# Patient Record
Sex: Female | Born: 1995 | Race: Black or African American | Hispanic: Yes | Marital: Single | Smoking: Never smoker
Health system: Southern US, Community
[De-identification: ages and names within clinical notes are randomized; demographics above are authoritative.]

## PROBLEM LIST (undated history)

## (undated) DIAGNOSIS — F329 Major depressive disorder, single episode, unspecified: Secondary | ICD-10-CM

## (undated) DIAGNOSIS — G35 Multiple sclerosis: Secondary | ICD-10-CM

## (undated) DIAGNOSIS — G43909 Migraine, unspecified, not intractable, without status migrainosus: Secondary | ICD-10-CM

## (undated) DIAGNOSIS — D649 Anemia, unspecified: Secondary | ICD-10-CM

## (undated) DIAGNOSIS — F419 Anxiety disorder, unspecified: Secondary | ICD-10-CM

## (undated) DIAGNOSIS — G35D Multiple sclerosis, unspecified: Secondary | ICD-10-CM

## (undated) DIAGNOSIS — F32A Depression, unspecified: Secondary | ICD-10-CM

## (undated) DIAGNOSIS — K219 Gastro-esophageal reflux disease without esophagitis: Secondary | ICD-10-CM

## (undated) HISTORY — DX: Depression, unspecified: F32.A

## (undated) HISTORY — DX: Anxiety disorder, unspecified: F41.9

## (undated) HISTORY — PX: NO PAST SURGERIES: SHX2092

---
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
## 2016-02-27 ENCOUNTER — Emergency Department (HOSPITAL_COMMUNITY): Payer: Medicaid Other

## 2016-02-27 ENCOUNTER — Encounter (HOSPITAL_COMMUNITY): Payer: Self-pay | Admitting: Emergency Medicine

## 2016-02-27 ENCOUNTER — Emergency Department (HOSPITAL_COMMUNITY)
Admission: EM | Admit: 2016-02-27 | Discharge: 2016-02-27 | Disposition: A | Discharge status: Home / Self Care | Payer: Medicaid Other | Attending: Emergency Medicine | Admitting: Emergency Medicine

## 2016-02-27 DIAGNOSIS — R0789 Other chest pain: Secondary | ICD-10-CM | POA: Diagnosis not present

## 2016-02-27 DIAGNOSIS — R079 Chest pain, unspecified: Secondary | ICD-10-CM

## 2016-02-27 DIAGNOSIS — R071 Chest pain on breathing: Secondary | ICD-10-CM | POA: Diagnosis present

## 2016-02-27 HISTORY — DX: Migraine, unspecified, not intractable, without status migrainosus: G43.909

## 2016-02-27 MED ORDER — IBUPROFEN 400 MG PO TABS: 400.0000 mg | ORAL_TABLET | Freq: Four times a day (QID) | ORAL | 0 refills | Status: DC | PRN

## 2016-02-27 NOTE — ED Notes (Signed)
PT DISCHARGED. INSTRUCTIONS AND PRESCRIPTION GIVEN. AAOX4. PT IN NO APPARENT DISTRESS. THE OPPORTUNITY TO ASK QUESTIONS WAS PROVIDED. 

## 2016-02-27 NOTE — ED Provider Notes (Signed)
[facility]-EMERGENCY DEPT Provider Note   CSN: [id number] Arrival date & time: 02/27/16  1844  By signing my name below, I, Phillis HaggisGabriella Gaje, attest that this documentation has been prepared under the direction and in the presence of Fayrene HelperBowie Darnise Montag, PA-C. Electronically Signed: Phillis HaggisGabriella Gaje, ED Scribe. 02/27/16. 8:48 PM.  History   Chief Complaint Chief Complaint  Patient presents with  . Chest Wall Pain   The history is provided by the patient. No language interpreter was used.   HPI Comments: Tracy Mcconnell is a 20 y.o. female who presents to the Emergency Department complaining of left sided chest wall pain onset earlier this morning. Pt says that she woke up with the pain. She has worsening pain with deep breathing and laying on her left side. She has relief with sitting still. She has taken one dose of Tylenol to no relief. She denies recent surgeries, recent long travel, recent heavy lifting, hx of similar symptoms, hx of blood clots, use of hormones, or hx of asthma. Pt is not a smoker. She denies SOB, lightheadedness, dizziness, nausea, vomiting, diaphoresis, abdominal pain, or back pain.   Past Medical History:  Diagnosis Date  . Migraines     There are no active problems to display for this patient.   History reviewed. No pertinent surgical history.  OB History    No data available       Home Medications    Prior to Admission medications   Not on File    Family History No family history on file.  Social History Social History  Substance Use Topics  . Smoking status: Never Smoker  . Smokeless tobacco: Never Used  . Alcohol use No     Allergies   Patient has no allergy information on record.   Review of Systems Review of Systems  Constitutional: Negative for diaphoresis.  Respiratory: Negative for shortness of breath.   Cardiovascular: Positive for chest pain.  Gastrointestinal: Negative for abdominal pain, nausea and vomiting.  Musculoskeletal: Negative  for back pain.  Neurological: Negative for dizziness and light-headedness.   Physical Exam Updated Vital Signs BP 123/75 (BP Location: Right Arm)   Pulse 92   Temp 98.2 F (36.8 C) (Oral)   Resp 16   Ht 5\' 1"  (1.549 m)   Wt 110 lb (49.9 kg)   LMP 02/08/2016   SpO2 96%   BMI 20.78 kg/m   Physical Exam  Constitutional: She is oriented to person, place, and time. She appears well-developed and well-nourished.  HENT:  Head: Normocephalic and atraumatic.  Eyes: EOM are normal. Pupils are equal, round, and reactive to light.  Neck: Normal range of motion. Neck supple.  Cardiovascular: Normal rate, regular rhythm, normal heart sounds and intact distal pulses.   Pulmonary/Chest: Effort normal and breath sounds normal. She exhibits no tenderness (mild tenderness to L anterior chest wall.).  No skin changes or rash,  no crepitus or emphysema.  Breast with nipple piercing, no evidence of infection.  Abdominal: Soft. There is no tenderness.  Musculoskeletal: Normal range of motion.  Neurological: She is alert and oriented to person, place, and time.  Skin: Skin is warm and dry.  Psychiatric: She has a normal mood and affect. Her behavior is normal.  Nursing note and vitals reviewed.  ED Treatments / Results  DIAGNOSTIC STUDIES: Oxygen Saturation is 96% on RA, normal by my interpretation.    COORDINATION OF CARE: 8:51 PM-Discussed treatment plan which includes chest x-ray with pt at bedside and pt agreed to  plan.    Labs (all labs ordered are listed, but only abnormal results are displayed) Labs Reviewed - No data to display  EKG  EKG Interpretation None       Radiology Dg Chest 2 View  Result Date: 02/27/2016 CLINICAL DATA:  Left lower anterior chest wall pain EXAM: CHEST  2 VIEW COMPARISON:  None. FINDINGS: The heart size and mediastinal contours are within normal limits. Both lungs are clear. There is no pneumothorax. The visualized skeletal structures are unremarkable.  Nipple piercings are noted bilaterally. IMPRESSION: No active cardiopulmonary disease. Electronically Signed   By: Tollie Eth M.D.   On: 02/27/2016 19:37    Procedures Procedures (including critical care time)  Medications Ordered in ED Medications - No data to display   Initial Impression / Assessment and Plan / ED Course  I have reviewed the triage vital signs and the nursing notes.  Pertinent labs & imaging results that were available during my care of the patient were reviewed by me and considered in my medical decision making (see chart for details).  Clinical Course     BP 123/75 (BP Location: Right Arm)   Pulse 92   Temp 98.2 F (36.8 C) (Oral)   Resp 16   Ht 5\' 1"  (1.549 m)   Wt 49.9 kg   LMP 02/08/2016   SpO2 96%   BMI 20.78 kg/m    Final Clinical Impressions(s) / ED Diagnoses   Final diagnoses:  Chest pain, unspecified type   I personally performed the services described in this documentation, which was scribed in my presence. The recorded information has been reviewed and is accurate.     New Prescriptions New Prescriptions   IBUPROFEN (ADVIL,MOTRIN) 400 MG TABLET    Take 1 tablet (400 mg total) by mouth every 6 (six) hours as needed.   9:26 PM Pt here with reproducible L chest wall pain.  Pain atypical for ACS.  PERC negative, doubt PE.  CXR unremarkable.  Pt resting comfortably. No evidence of skin infection.  Pt has piercing but no pain around piercing.  Reassurance given, return precaution given.  Pt stable for discharge.     Fayrene Helper, PA-C 02/27/16 2127    Lyndal Pulley, MD 02/28/16 [phone]

## 2016-02-27 NOTE — ED Triage Notes (Signed)
Pt c/o L sided chest pain since this morning. Sts that she rolled over in bed and noticed it. Pain worse with movement and deep breathing. Pt not taking oral contraceptives. Pt denies N/V, dizziness, radiation of pain. Pt denies that it is painful to touch but then winces with palpation from RN.  Pt A&Ox4 and ambulatory.

## 2016-02-29 ENCOUNTER — Encounter (HOSPITAL_COMMUNITY): Payer: Self-pay

## 2016-02-29 ENCOUNTER — Emergency Department (HOSPITAL_COMMUNITY)
Admission: EM | Admit: 2016-02-29 | Discharge: 2016-02-29 | Disposition: A | Discharge status: Home / Self Care | Payer: Medicaid Other | Attending: Emergency Medicine | Admitting: Emergency Medicine

## 2016-02-29 DIAGNOSIS — R079 Chest pain, unspecified: Secondary | ICD-10-CM | POA: Insufficient documentation

## 2016-02-29 LAB — URINE MICROSCOPIC-ADD ON

## 2016-02-29 LAB — CBC
HCT: 39 % (ref 36.0–46.0)
HEMOGLOBIN: 13.2 g/dL (ref 12.0–15.0)
MCH: 28.8 pg (ref 26.0–34.0)
MCHC: 33.8 g/dL (ref 30.0–36.0)
MCV: 85.2 fL (ref 78.0–100.0)
PLATELETS: 349 10*3/uL (ref 150–400)
RBC: 4.58 MIL/uL (ref 3.87–5.11)
RDW: 12.7 % (ref 11.5–15.5)
WBC: 7.7 10*3/uL (ref 4.0–10.5)

## 2016-02-29 LAB — BASIC METABOLIC PANEL
ANION GAP: 7 (ref 5–15)
BUN: 10 mg/dL (ref 6–20)
CALCIUM: 9.6 mg/dL (ref 8.9–10.3)
CO2: 25 mmol/L (ref 22–32)
Chloride: 105 mmol/L (ref 101–111)
Creatinine, Ser: 0.74 mg/dL (ref 0.44–1.00)
Glucose, Bld: 101 mg/dL — ABNORMAL HIGH (ref 65–99)
Potassium: 3.5 mmol/L (ref 3.5–5.1)
SODIUM: 137 mmol/L (ref 135–145)

## 2016-02-29 LAB — URINALYSIS, ROUTINE W REFLEX MICROSCOPIC
BILIRUBIN URINE: NEGATIVE
GLUCOSE, UA: NEGATIVE mg/dL
Hgb urine dipstick: NEGATIVE
KETONES UR: 15 mg/dL — AB
NITRITE: NEGATIVE
PH: 6 (ref 5.0–8.0)
Protein, ur: NEGATIVE mg/dL
Specific Gravity, Urine: 1.019 (ref 1.005–1.030)

## 2016-02-29 LAB — POC URINE PREG, ED: Preg Test, Ur: NEGATIVE

## 2016-02-29 LAB — I-STAT TROPONIN, ED: TROPONIN I, POC: 0 ng/mL (ref 0.00–0.08)
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
## 2016-02-29 NOTE — Progress Notes (Signed)
Medicaid [place] access response hx indicates the assigned pcp is [facility] [address] [place], [place] [address] [phone]

## 2016-02-29 NOTE — Discharge Instructions (Signed)
Please read and follow all provided instructions.  Your diagnoses today include:  1. Chest pain, unspecified type     Tests performed today include:  An EKG of your heart  Cardiac enzymes - a blood test for heart muscle damage  Blood counts and electrolytes  Urine test - no infection  Vital signs. See below for your results today.   Medications prescribed:   None  Take any prescribed medications only as directed.  Follow-up instructions: Please follow-up with your primary care provider as soon as you can for further evaluation of your symptoms.   Return instructions:  SEEK IMMEDIATE MEDICAL ATTENTION IF:  You have severe chest pain, especially if the pain is crushing or pressure-like and spreads to the arms, back, neck, or jaw, or if you have sweating, nausea (feeling sick to your stomach), or shortness of breath. THIS IS AN EMERGENCY. Don't wait to see if the pain will go away. Get medical help at once. Call 911 or [phone] (operator). DO NOT drive yourself to the hospital.   Your chest pain gets worse and does not go away with rest.   You have an attack of chest pain lasting longer than usual, despite rest and treatment with the medications your caregiver has prescribed.   You wake from sleep with chest pain or shortness of breath.  You feel dizzy or faint.  You have chest pain not typical of your usual pain for which you originally saw your caregiver.   You have any other emergent concerns regarding your health.  Additional Information: Chest pain comes from many different causes. Your caregiver has diagnosed you as having chest pain that is not specific for one problem, but does not require admission.  You are at low risk for an acute heart condition or other serious illness.   Your vital signs today were: BP 114/75 (BP Location: Left Arm)    Pulse 93    Temp 97.7 F (36.5 C) (Oral)    Resp 18    LMP 02/08/2016    SpO2 98%  If your blood pressure (BP) was elevated above  135/85 this visit, please have this repeated by your doctor within one month. --------------

## 2016-02-29 NOTE — ED Triage Notes (Signed)
Pt seen on Monday for same.  Pt started having chest pain with dizziness on Monday.  Pt denies cough/fever.  Pt has had nausea with no vomiting.  Pt has hx of anxiety.  DX Monday was unspecified.  Pt was on way to school today when symptoms reoccurred.  Pt filled script of ibuprofen and has been taking.

## 2017-02-04 ENCOUNTER — Encounter: Payer: Self-pay | Admitting: Neurology

## 2017-02-04 ENCOUNTER — Ambulatory Visit: Payer: BLUE CROSS/BLUE SHIELD | Admitting: Neurology

## 2017-02-04 VITALS — BP 98/65 | HR 80 | Wt 118.4 lb

## 2017-02-04 DIAGNOSIS — R42 Dizziness and giddiness: Secondary | ICD-10-CM

## 2017-02-04 DIAGNOSIS — H532 Diplopia: Secondary | ICD-10-CM

## 2017-02-04 DIAGNOSIS — H5462 Unqualified visual loss, left eye, normal vision right eye: Secondary | ICD-10-CM

## 2017-02-04 DIAGNOSIS — R519 Headache, unspecified: Secondary | ICD-10-CM

## 2017-02-04 DIAGNOSIS — R51 Headache: Secondary | ICD-10-CM | POA: Diagnosis not present

## 2017-02-04 DIAGNOSIS — G43719 Chronic migraine without aura, intractable, without status migrainosus: Secondary | ICD-10-CM | POA: Diagnosis not present

## 2017-02-04 MED ORDER — GABAPENTIN 300 MG PO CAPS: 300.0000 mg | ORAL_CAPSULE | Freq: Three times a day (TID) | ORAL | 11 refills | Status: DC

## 2017-02-04 NOTE — Patient Instructions (Addendum)
Gabapentin capsules or tablets What is this medicine? GABAPENTIN (GA ba pen tin) is used to control partial seizures in adults with epilepsy. It is also used to treat certain types of nerve pain. This medicine may be used for other purposes; ask your health care provider or pharmacist if you have questions. COMMON BRAND NAME(S): Active-PAC with Gabapentin, Gabarone, Neurontin What should I tell my health care provider before I take this medicine? They need to know if you have any of these conditions: -kidney disease -suicidal thoughts, plans, or attempt; a previous suicide attempt by you or a family member -an unusual or allergic reaction to gabapentin, other medicines, foods, dyes, or preservatives -pregnant or trying to get pregnant -breast-feeding How should I use this medicine? Take this medicine by mouth with a glass of water. Follow the directions on the prescription label. You can take it with or without food. If it upsets your stomach, take it with food.Take your medicine at regular intervals. Do not take it more often than directed. Do not stop taking except on your doctor's advice. If you are directed to break the 600 or 800 mg tablets in half as part of your dose, the extra half tablet should be used for the next dose. If you have not used the extra half tablet within 28 days, it should be thrown away. A special MedGuide will be given to you by the pharmacist with each prescription and refill. Be sure to read this information carefully each time. Talk to your pediatrician regarding the use of this medicine in children. Special care may be needed. Overdosage: If you think you have taken too much of this medicine contact a poison control center or emergency room at once. NOTE: This medicine is only for you. Do not share this medicine with others. What if I miss a dose? If you miss a dose, take it as soon as you can. If it is almost time for your next dose, take only that dose. Do not  take double or extra doses. What may interact with this medicine? Do not take this medicine with any of the following medications: -other gabapentin products This medicine may also interact with the following medications: -alcohol -antacids -antihistamines for allergy, cough and cold -certain medicines for anxiety or sleep -certain medicines for depression or psychotic disturbances -homatropine; hydrocodone -naproxen -narcotic medicines (opiates) for pain -phenothiazines like chlorpromazine, mesoridazine, prochlorperazine, thioridazine This list may not describe all possible interactions. Give your health care provider a list of all the medicines, herbs, non-prescription drugs, or dietary supplements you use. Also tell them if you smoke, drink alcohol, or use illegal drugs. Some items may interact with your medicine. What should I watch for while using this medicine? Visit your doctor or health care professional for regular checks on your progress. You may want to keep a record at home of how you feel your condition is responding to treatment. You may want to share this information with your doctor or health care professional at each visit. You should contact your doctor or health care professional if your seizures get worse or if you have any new types of seizures. Do not stop taking this medicine or any of your seizure medicines unless instructed by your doctor or health care professional. Stopping your medicine suddenly can increase your seizures or their severity. Wear a medical identification bracelet or chain if you are taking this medicine for seizures, and carry a card that lists all your medications. You may get drowsy, dizzy,   or have blurred vision. Do not drive, use machinery, or do anything that needs mental alertness until you know how this medicine affects you. To reduce dizzy or fainting spells, do not sit or stand up quickly, especially if you are an older patient. Alcohol can  increase drowsiness and dizziness. Avoid alcoholic drinks. Your mouth may get dry. Chewing sugarless gum or sucking hard candy, and drinking plenty of water will help. The use of this medicine may increase the chance of suicidal thoughts or actions. Pay special attention to how you are responding while on this medicine. Any worsening of mood, or thoughts of suicide or dying should be reported to your health care professional right away. Women who become pregnant while using this medicine may enroll in the [place] American Antiepileptic Drug Pregnancy Registry by calling [phone]. This registry collects information about the safety of antiepileptic drug use during pregnancy. What side effects may I notice from receiving this medicine? Side effects that you should report to your doctor or health care professional as soon as possible: -allergic reactions like skin rash, itching or hives, swelling of the face, lips, or tongue -worsening of mood, thoughts or actions of suicide or dying Side effects that usually do not require medical attention (report to your doctor or health care professional if they continue or are bothersome): -constipation -difficulty walking or controlling muscle movements -dizziness -nausea -slurred speech -tiredness -tremors -weight gain This list may not describe all possible side effects. Call your doctor for medical advice about side effects. You may report side effects to FDA at 1-800-FDA-1088. Where should I keep my medicine? Keep out of reach of children. This medicine may cause accidental overdose and death if it taken by other adults, children, or pets. Mix any unused medicine with a substance like cat litter or coffee grounds. Then throw the medicine away in a sealed container like a sealed bag or a coffee can with a lid. Do not use the medicine after the expiration date. Store at room temperature between 15 and 30 degrees C (59 and 86 degrees F). NOTE: This  sheet is a summary. It may not cover all possible information. If you have questions about this medicine, talk to your doctor, pharmacist, or health care provider.  2018 Elsevier/Gold Standard (2013-05-15 15:26:50)   To prevent or relieve headaches, try the following: Cool Compress. Lie down and place a cool compress on your head.  Avoid headache triggers. If certain foods or odors seem to have triggered your migraines in the past, avoid them. A headache diary might help you identify triggers.  Include physical activity in your daily routine. Try a daily walk or other moderate aerobic exercise.  Manage stress. Find healthy ways to cope with the stressors, such as delegating tasks on your to-do list.  Practice relaxation techniques. Try deep breathing, yoga, massage and visualization.  Eat regularly. Eating regularly scheduled meals and maintaining a healthy diet might help prevent headaches. Also, drink plenty of fluids.  Follow a regular sleep schedule. Sleep deprivation might contribute to headaches Consider biofeedback. With this mind-body technique, you learn to control certain bodily functions - such as muscle tension, heart rate and blood pressure - to prevent headaches or reduce headache pain.    Proceed to emergency room if you experience new or worsening symptoms or symptoms do not resolve, if you have new neurologic symptoms or if headache is severe, or for any concerning symptom.   Provided education and documentation from American headache Society toolbox including  articles on: chronic migraine medication overuse headache, chronic migraines, prevention of migraines, behavioral and other nonpharmacologic treatments for headache.    Migraine Headache A migraine headache is an intense, throbbing pain on one side or both sides of the head. Migraines may also cause other symptoms, such as nausea, vomiting, and sensitivity to light and noise. What are the causes? Doing or taking  certain things may also trigger migraines, such as:  Alcohol.  Smoking.  Medicines, such as: ? Medicine used to treat chest pain (nitroglycerine). ? Birth control pills. ? Estrogen pills. ? Certain blood pressure medicines.  Aged cheeses, chocolate, or caffeine.  Foods or drinks that contain nitrates, glutamate, aspartame, or tyramine.  Physical activity.  Other things that may trigger a migraine include:  Menstruation.  Pregnancy.  Hunger.  Stress, lack of sleep, too much sleep, or fatigue.  Weather changes.  What increases the risk? The following factors may make you more likely to experience migraine headaches:  Age. Risk increases with age.  Family history of migraine headaches.  Being Caucasian.  Depression and anxiety.  Obesity.  Being a woman.  Having a hole in the heart (patent foramen ovale) or other heart problems.  What are the signs or symptoms? The main symptom of this condition is pulsating or throbbing pain. Pain may:  Happen in any area of the head, such as on one side or both sides.  Interfere with daily activities.  Get worse with physical activity.  Get worse with exposure to bright lights or loud noises.  Other symptoms may include:  Nausea.  Vomiting.  Dizziness.  General sensitivity to bright lights, loud noises, or smells.  Before you get a migraine, you may get warning signs that a migraine is developing (aura). An aura may include:  Seeing flashing lights or having blind spots.  Seeing bright spots, halos, or zigzag lines.  Having tunnel vision or blurred vision.  Having numbness or a tingling feeling.  Having trouble talking.  Having muscle weakness.  How is this diagnosed? A migraine headache can be diagnosed based on:  Your symptoms.  A physical exam.  Tests, such as CT scan or MRI of the head. These imaging tests can help rule out other causes of headaches.  Taking fluid from the spine (lumbar  puncture) and analyzing it (cerebrospinal fluid analysis, or CSF analysis).  How is this treated? A migraine headache is usually treated with medicines that:  Relieve pain.  Relieve nausea.  Prevent migraines from coming back.  Treatment may also include:  Acupuncture.  Lifestyle changes like avoiding foods that trigger migraines.  Follow these instructions at home: Medicines  Take over-the-counter and prescription medicines only as told by your health care provider.  Do not drive or use heavy machinery while taking prescription pain medicine.  To prevent or treat constipation while you are taking prescription pain medicine, your health care provider may recommend that you: ? Drink enough fluid to keep your urine clear or pale yellow. ? Take over-the-counter or prescription medicines. ? Eat foods that are high in fiber, such as fresh fruits and vegetables, whole grains, and beans. ? Limit foods that are high in fat and processed sugars, such as fried and sweet foods. Lifestyle  Avoid alcohol use.  Do not use any products that contain nicotine or tobacco, such as cigarettes and e-cigarettes. If you need help quitting, ask your health care provider.  Get at least 8 hours of sleep every night.  Limit your stress. General instructions  Keep a journal to find out what may trigger your migraine headaches. For example, write down: ? What you eat and drink. ? How much sleep you get. ? Any change to your diet or medicines.  If you have a migraine: ? Avoid things that make your symptoms worse, such as bright lights. ? It may help to lie down in a dark, quiet room. ? Do not drive or use heavy machinery. ? Ask your health care provider what activities are safe for you while you are experiencing symptoms.  Keep all follow-up visits as told by your health care provider. This is important. Contact a health care provider if:  You develop symptoms that are different or more  severe than your usual migraine symptoms. Get help right away if:  Your migraine becomes severe.  You have a fever.  You have a stiff neck.  You have vision loss.  Your muscles feel weak or like you cannot control them.  You start to lose your balance often.  You develop trouble walking.  You faint. This information is not intended to replace advice given to you by your health care provider. Make sure you discuss any questions you have with your health care provider. Document Released: 03/19/2005 Document Revised: 10/07/2015 Document Reviewed: 09/05/2015 Elsevier Interactive Patient Education  2017 [employer].

## 2017-02-04 NOTE — Progress Notes (Signed)
[facility]    Provider:  Dr Lucia GaskinsAhern Referring Provider: Inis SizerWebster, William L, PA-C Primary Care Physician:   Inis SizerWebster, William L, PA-C  CC:  migraines  HPI:  Tracy BurkeCaitlyn Mcconnell is a 21 y.o. female here as a referral from Dr. Zenda AlpersWebster for migraines, anxiety. She has a FHx of migraines in mother. She has had migraines since the 5. Her migraines are unilateral, move to the other side, behind the eyes, squeezing, pulsating, bad nausea but no vomiting, she has light and sound sensitivity, she has to sit perfectly still because movement exacerbates it. She has headaches every day, she wakes up with them. If she spends too much time in the sunlight she can get a migraine, stress. She has had daily headaches in the last year, can last all day long some migraines last a week, every headache is migrainous. No medication overuse. Rarely she has an aura, she can have a cloud sometimes on the left but not always. Not sleeping at night, there is a lot of stress. Laying down in the dark helps. Grandmother with brain tumor, unknown kind.  Reviewed notes, labs and imaging from outside physicians, which showed:   Medications tried for migraine: Sertraline, topiramate(for a year, 100mg ), amitriptyline(for a year), Imitrex(bad reaction), clonidine.  Patient has 2-4 migraines per month. Failure or intolerance to Imitrex, Topamax and amitriptyline. She has a history of migraine headache with and without aura, currently having several headaches per month, has been unable to tolerate multiple medications, no status migrainosus.  Reviewed emergency room notes she has been in the emergency roomm ultiple times in the last year for anxiety presenting as chest pain and dizziness, chest x-ray was normal, heart racing, chest pain, sharp in the middle of her chest, lightheaded but no syncopal episode.    Review of Systems: Patient complains of symptoms per HPI as well as the following symptoms: fevers chills,  fa, blurred visio, double vision, eye pain, feeling cold, increased thirst, restless legs, decreased energy, disinterest in activities. Pertinent negatives and positives per HPI. All others negative.   Social History   Socioeconomic History  . Marital status: Single    Spouse name: Not on file  . Number of children: 0  . Years of education: college student  . Highest education level: Not on file  Social Needs  . Financial resource strain: Not on file  . Food insecurity - worry: Not on file  . Food insecurity - inability: Not on file  . Transportation needs - medical: No  . Transportation needs - non-medical: No  Occupational History  . Not on file  Tobacco Use  . Smoking status: Never Smoker  . Smokeless tobacco: Never Used  Substance and Sexual Activity  . Alcohol use: No  . Drug use: No  . Sexual activity: Not on file  Other Topics Concern  . Not on file  Social History Narrative   Lives at home alone   Right handed   Does not drink caffeine, makes her nauseated    Family History  Problem Relation Age of Onset  . Migraines Mother   . Thyroid disease Mother   . Diabetes Paternal Grandfather     Past Medical History:  Diagnosis Date  . Migraines     Past Surgical History:  Procedure Laterality Date  . NO PAST SURGERIES      Current Outpatient Medications  Medication Sig Dispense Refill  . acetaminophen (TYLENOL) 500 MG tablet Take 500 mg by mouth every 6 (six) hours  as needed (pain).    . cloNIDine (CATAPRES) 0.1 MG tablet Take 0.1-0.2 mg at bedtime as needed by mouth.    [name] ibuprofen (ADVIL,MOTRIN) 400 MG tablet Take 1 tablet (400 mg total) by mouth every 6 (six) hours as needed. 30 tablet 0  . Sertraline HCl (ZOLOFT PO) Take 75 mg daily by mouth.    . gabapentin (NEURONTIN) 300 MG capsule Take 1 capsule (300 mg total) 3 (three) times daily by mouth. 90 capsule 11   No current facility-administered medications for this visit.     Allergies as of  02/04/2017 - Review Complete 02/04/2017  Allergen Reaction Noted  . Imitrex [sumatriptan] Shortness Of Breath 02/04/2017    Vitals: BP 98/65 (BP Location: Right Arm, Patient Position: Sitting)   Pulse 80   Wt 118 lb 6.4 oz (53.7 kg)   BMI 22.37 kg/m  Last Weight:  Wt Readings from Last 1 Encounters:  02/04/17 118 lb 6.4 oz (53.7 kg)   Last Height:   Ht Readings from Last 1 Encounters:  02/27/16 5\' 1"  (1.549 m)   Physical exam: Exam: Gen: NAD, conversant                 CV: RRR, no MRG. No Carotid Bruits. No peripheral edema, warm, nontender Eyes: Conjunctivae clear without exudates or hemorrhage  Neuro: Detailed Neurologic Exam  Speech:    Speech is normal; fluent and spontaneous with normal comprehension.  Cognition:    The patient is oriented to person, place, and time;     recent and remote memory intact;     language fluent;     normal attention, concentration,     fund of knowledge Cranial Nerves:    The pupils are equal, round, and reactive to light. The fundi are normal and spontaneous venous pulsations are present. Visual fields are full to finger confrontation. Extraocular movements are intact. Trigeminal sensation is intact and the muscles of mastication are normal. The face is symmetric. The palate elevates in the midline. Hearing intact. Voice is normal. Shoulder shrug is normal. The tongue has normal motion without fasciculations.   Coordination:    Normal finger to nose and heel to shin. Normal rapid alternating movements.   Gait:    Heel-toe and tandem gait are normal.   Motor Observation:    No asymmetry, no atrophy, and no involuntary movements noted. Tone:    Normal muscle tone.    Posture:    Posture is normal. normal erect    Strength:    Strength is V/V in the upper and lower limbs.      Sensation: intact to LT     Reflex Exam:  DTR's:    Deep tendon reflexes in the upper and lower extremities are normal bilaterally.   Toes:    The  toes are downgoing bilaterally.   Clonus:    Clonus is absent.       Assessment/Plan:  21 year old with chronic intractable headaches with concerning associated symptoms such as positional quality and diplopia absolutely need MRI of the brain with and without contrast as below. Differential includes chronic migraines, we'll start gabapentin, there is a component of medication overuse discussed daily ibuprofen and need to titrate to less than 10 days a month, discussed Botox for migraine or CGRP  Start Gabapentin for prevention, will discuss acute medications at next appointment Titrate off of the daily ibuprofen  Botox for migraine or CGRP would be the next steps MRI of the brain w/wo contrast for  positional headaches, diplopia, dizziness and vertigo to evaluate for space occupying lesions or masses, intracranial hypertension, compressive lesions in the brain.  Discussed:  There is increased risk for stroke in women with migraine with aura and a  Contraindication for the combined contraceptive pill for use by women who have migraine with aura, which is in line with World Health Organisation recommendations. The risk for women with migraine without aura is lower and other risk factors like smoking are far more likely to increase stroke risk than migraine. There is a recommendation for no smoking and for the use of low estrogen or progestogen only pills particularly for women with migraine with aura. It is important however that women with migraine who are taking the pill do not decide to suddenly stop taking it without discussing this with their doctor. Please discuss with her OB/GYN.   Orders Placed This Encounter  Procedures  . MR BRAIN W WO CONTRAST  . CBC  . Comprehensive metabolic panel  . TSH    Discussed: To prevent or relieve headaches, try the following: Cool Compress. Lie down and place a cool compress on your head.  Avoid headache triggers. If certain foods or odors seem to  have triggered your migraines in the past, avoid them. A headache diary might help you identify triggers.  Include physical activity in your daily routine. Try a daily walk or other moderate aerobic exercise.  Manage stress. Find healthy ways to cope with the stressors, such as delegating tasks on your to-do list.  Practice relaxation techniques. Try deep breathing, yoga, massage and visualization.  Eat regularly. Eating regularly scheduled meals and maintaining a healthy diet might help prevent headaches. Also, drink plenty of fluids.  Follow a regular sleep schedule. Sleep deprivation might contribute to headaches Consider biofeedback. With this mind-body technique, you learn to control certain bodily functions - such as muscle tension, heart rate and blood pressure - to prevent headaches or reduce headache pain.    Proceed to emergency room if you experience new or worsening symptoms or symptoms do not resolve, if you have new neurologic symptoms or if headache is severe, or for any concerning symptom.   Provided education and documentation from American headache Society toolbox including articles on: chronic migraine medication overuse headache, chronic migraines, prevention of migraines, behavioral and other nonpharmacologic treatments for headache.   Naomie Dean, MD  [facility] [address] [place], [place] [address]  Phone [phone] Fax [phone]

## 2017-02-05 ENCOUNTER — Telehealth: Payer: Self-pay | Admitting: *Deleted

## 2017-02-05 ENCOUNTER — Encounter: Payer: Self-pay | Admitting: *Deleted

## 2017-02-05 LAB — COMPREHENSIVE METABOLIC PANEL
ALT: 9 IU/L (ref 0–32)
AST: 19 IU/L (ref 0–40)
Albumin/Globulin Ratio: 1.7 (ref 1.2–2.2)
Albumin: 4.8 g/dL (ref 3.5–5.5)
Alkaline Phosphatase: 107 IU/L (ref 39–117)
BUN/Creatinine Ratio: 18 (ref 9–23)
BUN: 13 mg/dL (ref 6–20)
Bilirubin Total: 0.5 mg/dL (ref 0.0–1.2)
CALCIUM: 10 mg/dL (ref 8.7–10.2)
CO2: 19 mmol/L — ABNORMAL LOW (ref 20–29)
CREATININE: 0.74 mg/dL (ref 0.57–1.00)
Chloride: 100 mmol/L (ref 96–106)
GFR, EST AFRICAN AMERICAN: 134 mL/min/{1.73_m2} (ref >59)
GFR, EST NON AFRICAN AMERICAN: 116 mL/min/{1.73_m2} (ref >59)
GLOBULIN, TOTAL: 2.8 g/dL (ref 1.5–4.5)
Glucose: 73 mg/dL (ref 65–99)
Potassium: 4.2 mmol/L (ref 3.5–5.2)
SODIUM: 138 mmol/L (ref 134–144)
TOTAL PROTEIN: 7.6 g/dL (ref 6.0–8.5)

## 2017-02-05 LAB — CBC
HEMATOCRIT: 39.9 % (ref 34.0–46.6)
HEMOGLOBIN: 13.4 g/dL (ref 11.1–15.9)
MCH: 28.5 pg (ref 26.6–33.0)
MCHC: 33.6 g/dL (ref 31.5–35.7)
MCV: 85 fL (ref 79–97)
Platelets: 392 10*3/uL — ABNORMAL HIGH (ref 150–379)
RBC: 4.71 x10E6/uL (ref 3.77–5.28)
RDW: 13.1 % (ref 12.3–15.4)
WBC: 9.7 10*3/uL (ref 3.4–10.8)

## 2017-02-05 LAB — TSH: TSH: 2.24 u[IU]/mL (ref 0.450–4.500)

## 2017-02-05 NOTE — Telephone Encounter (Signed)
When the patient calls back, please also tell her that her labs are unremarkable.

## 2017-02-05 NOTE — Telephone Encounter (Signed)
Letter for work written for patient. Ready for pickup at front desk. Called and LVM for patient (unable to leave details).   If she calls back, please let her know that the letter for work is at the front desk.

## 2017-02-06 NOTE — Telephone Encounter (Signed)
Called and LVM (ok per DPR) informing pt that her letter for work has been written and is at the front desk for pickup by her. I also informed her that her labs are unremarkable. I left our office number in case she has any questions and stated that this message does not require a call back.

## 2017-02-12 ENCOUNTER — Ambulatory Visit: Payer: BLUE CROSS/BLUE SHIELD

## 2017-02-12 DIAGNOSIS — R42 Dizziness and giddiness: Secondary | ICD-10-CM

## 2017-02-12 DIAGNOSIS — H5462 Unqualified visual loss, left eye, normal vision right eye: Secondary | ICD-10-CM

## 2017-02-12 DIAGNOSIS — H532 Diplopia: Secondary | ICD-10-CM

## 2017-02-12 DIAGNOSIS — R51 Headache: Secondary | ICD-10-CM

## 2017-02-12 DIAGNOSIS — R519 Headache, unspecified: Secondary | ICD-10-CM

## 2017-02-18 ENCOUNTER — Telehealth: Payer: Self-pay | Admitting: *Deleted

## 2017-02-18 NOTE — Telephone Encounter (Signed)
-----   Message from Anson FretAntonia B Ahern, MD sent at 02/17/2017  8:38 PM EST ----- MRI of the brain is normal thanks

## 2017-02-18 NOTE — Telephone Encounter (Signed)
Called and LVM (ok per DPR) informing pt that her MRI of brain is normal. This message does not require a call back but I left the office number in case she had any questions.

## 2017-05-06 ENCOUNTER — Telehealth: Payer: Self-pay | Admitting: Neurology

## 2017-05-06 NOTE — Telephone Encounter (Signed)
Error

## 2017-05-08 DIAGNOSIS — Z0289 Encounter for other administrative examinations: Secondary | ICD-10-CM

## 2017-05-16 ENCOUNTER — Telehealth: Payer: Self-pay

## 2017-05-16 NOTE — Telephone Encounter (Signed)
Rn call patient about requesting a letter for office of accessibility resources and services. PT did sign a release form for the letter. Rn stated Dr. Lucia Gaskins needed to know what she needs in the letter because one was written 01/2017. PT stated another letter is needed because of her chronic illness of migraines. The letter needs to be on letter head paper from the MD. It needs to include the following:  1. What is her chronic illness? 2. What medications she takes for the illness? 3. What symptoms she has when a migraine episode? 4. How often she will have the migraines?  Rn stated that usually this information goes in a FMLA form. Rn stated a message will be sent to Dr. Lucia Gaskins.Pt verbalized understanding.

## 2017-05-22 NOTE — Telephone Encounter (Signed)
Sinclair Grooms patient's partner called to give fax number for school. Fax number is [phone]

## 2017-05-22 NOTE — Telephone Encounter (Signed)
If patient calls back we just need the fax number with to send the letter.  Left vm for patient to call back with fax number. Rn stated on vm the letter was done for her school.

## 2017-05-22 NOTE — Telephone Encounter (Signed)
Letter was fax to [phone]. Fax confirmed and receive.

## 2017-05-22 NOTE — Telephone Encounter (Signed)
Rn was notified by front desk that pts partner Sinclair Grooms wants a copy of letter. Letter given to [facility] for patient.

## 2017-06-06 ENCOUNTER — Ambulatory Visit: Payer: BLUE CROSS/BLUE SHIELD | Admitting: Adult Health

## 2017-06-06 ENCOUNTER — Encounter: Payer: Self-pay | Admitting: Adult Health

## 2017-06-06 ENCOUNTER — Encounter (INDEPENDENT_AMBULATORY_CARE_PROVIDER_SITE_OTHER): Payer: Self-pay

## 2017-06-06 VITALS — BP 104/67 | HR 75 | Ht 61.0 in | Wt 123.0 lb

## 2017-06-06 DIAGNOSIS — G43019 Migraine without aura, intractable, without status migrainosus: Secondary | ICD-10-CM | POA: Diagnosis not present

## 2017-06-06 MED ORDER — FREMANEZUMAB-VFRM 225 MG/1.5ML ~~LOC~~ SOSY: 225.0000 mg | PREFILLED_SYRINGE | SUBCUTANEOUS | 5 refills | Status: DC

## 2017-06-06 NOTE — Patient Instructions (Signed)
Your Plan:  Continue gabapentin for now Start Ajovy If your symptoms worsen or you develop new symptoms please let us know.   Thank you for coming to see [place] at [facility]. I hope we have been able to provide you high quality care today.  You may receive a patient satisfaction survey over the next few weeks. We would appreciate your feedback and comments so that we may continue to improve ourselves and the health of our patients.

## 2017-06-06 NOTE — Progress Notes (Signed)
PATIENT: Tracy Mcconnell DOB: 08-17-1995  REASON FOR VISIT: follow up HISTORY FROM: patient  HISTORY OF PRESENT ILLNESS: Today 06/06/17  [name] is a 22 year old female with a history of migraine headaches.  She returns today for follow-up.  She was placed on gabapentin 300 mg 3 times a day at the last visit.  She reports that it has not offered her any benefit with her headaches.  She continues to have approximately 4 migraines a month reports that 1 migraine can last almost a full week sometimes.  She states that she only has 10 headache free days out of the month.  She states that the headaches are behind her eyes and spread all over her head.  She does have photophobia, phonophobia and nausea.  Denies any vomiting.  She denies any numbness or tingling in extremities.  In the past she has tried Imitrex, Topamax and amitriptyline but was unable to tolerate these medications.  She returns today for evaluation.  HISTORY Tracy Mcconnell is a 22 y.o. female here as a referral from Dr. Zenda Alpers for migraines, anxiety. She has a FHx of migraines in mother. She has had migraines since the 5. Her migraines are unilateral, move to the other side, behind the eyes, squeezing, pulsating, bad nausea but no vomiting, she has light and sound sensitivity, she has to sit perfectly still because movement exacerbates it. She has headaches every day, she wakes up with them. If she spends too much time in the sunlight she can get a migraine, stress. She has had daily headaches in the last year, can last all day long some migraines last a week, every headache is migrainous. No medication overuse. Rarely she has an aura, she can have a cloud sometimes on the left but not always. Not sleeping at night, there is a lot of stress. Laying down in the dark helps. Grandmother with brain tumor, unknown kind.  Reviewed notes, labs and imaging from outside physicians, which showed:   Medications tried for migraine:  Sertraline, topiramate(for a year, 100mg ), amitriptyline(for a year), Imitrex(bad reaction), clonidine.  Patient has 2-4 migraines per month. Failure or intolerance to Imitrex, Topamax and amitriptyline. She has a history of migraine headache with and without aura, currently having several headaches per month, has been unable to tolerate multiple medications, no status migrainosus.  Reviewed emergency room notes she has been in the emergency roomm ultiple times in the last year for anxiety presenting as chest pain and dizziness, chest x-ray was normal, heart racing, chest pain, sharp in the middle of her chest, lightheaded but no syncopal episode.   REVIEW OF SYSTEMS: Out of a complete 14 system review of symptoms, the patient complains only of the following symptoms, and all other reviewed systems are negative.  ALLERGIES: Allergies  Allergen Reactions  . Imitrex [Sumatriptan] Shortness Of Breath    Entire body felt heavy    HOME MEDICATIONS: Outpatient Medications Prior to Visit  Medication Sig Dispense Refill  . acetaminophen (TYLENOL) 500 MG tablet Take 500 mg by mouth every 6 (six) hours as needed (pain).    . BuPROPion HCl (WELLBUTRIN PO) Take by mouth. Takes one table in morning    . BUSPIRONE HCL PO Take by mouth. Takes twice daily    . cloNIDine (CATAPRES) 0.1 MG tablet Take 0.1-0.2 mg at bedtime as needed by mouth.    . escitalopram (LEXAPRO) 20 MG tablet Take 20 mg by mouth at bedtime.    . gabapentin (NEURONTIN) 300 MG capsule  Take 1 capsule (300 mg total) 3 (three) times daily by mouth. 90 capsule 11  . ibuprofen (ADVIL,MOTRIN) 400 MG tablet Take 1 tablet (400 mg total) by mouth every 6 (six) hours as needed. 30 tablet 0  . Sertraline HCl (ZOLOFT PO) Take 75 mg daily by mouth.     No facility-administered medications prior to visit.     PAST MEDICAL HISTORY: Past Medical History:  Diagnosis Date  . Migraines     PAST SURGICAL HISTORY: Past Surgical History:    Procedure Laterality Date  . NO PAST SURGERIES      FAMILY HISTORY: Family History  Problem Relation Age of Onset  . Migraines Mother   . Thyroid disease Mother   . Diabetes Paternal Grandfather     SOCIAL HISTORY: Social History   Socioeconomic History  . Marital status: Single    Spouse name: Not on file  . Number of children: 0  . Years of education: college student  . Highest education level: Not on file  Social Needs  . Financial resource strain: Not on file  . Food insecurity - worry: Not on file  . Food insecurity - inability: Not on file  . Transportation needs - medical: No  . Transportation needs - non-medical: No  Occupational History  . Not on file  Tobacco Use  . Smoking status: Never Smoker  . Smokeless tobacco: Never Used  Substance and Sexual Activity  . Alcohol use: No  . Drug use: No  . Sexual activity: Not on file  Other Topics Concern  . Not on file  Social History Narrative   Lives at home alone   Right handed   Does not drink caffeine, makes her nauseated      PHYSICAL EXAM  Vitals:   06/06/17 1451  BP: 104/67  Pulse: 75  Weight: 123 lb (55.8 kg)  Height: 5\' 1"  (1.549 m)   Body mass index is 23.24 kg/m.  Generalized: Well developed, in no acute distress   Neurological examination  Mentation: Alert oriented to time, place, history taking. Follows all commands speech and language fluent Cranial nerve II-XII: Pupils were equal round reactive to light. Extraocular movements were full, visual field were full on confrontational test. Facial sensation and strength were normal. Uvula tongue midline. Head turning and shoulder shrug  were normal and symmetric. Motor: The motor testing reveals 5 over 5 strength of all 4 extremities. Good symmetric motor tone is noted throughout.  Sensory: Sensory testing is intact to soft touch on all 4 extremities. No evidence of extinction is noted.  Coordination: Cerebellar testing reveals good  finger-nose-finger and heel-to-shin bilaterally.  Gait and station: Gait is normal. Tandem gait is normal. Romberg is negative. No drift is seen.  Reflexes: Deep tendon reflexes are symmetric and normal bilaterally.   DIAGNOSTIC DATA (LABS, IMAGING, TESTING) - I reviewed patient records, labs, notes, testing and imaging myself where available.  Lab Results  Component Value Date   WBC 9.7 02/04/2017   HGB 13.4 02/04/2017   HCT 39.9 02/04/2017   MCV 85 02/04/2017   PLT 392 (H) 02/04/2017      Component Value Date/Time   NA 138 02/04/2017 1541   K 4.2 02/04/2017 1541   CL 100 02/04/2017 1541   CO2 19 (L) 02/04/2017 1541   GLUCOSE 73 02/04/2017 1541   GLUCOSE 101 (H) 02/29/2016 1513   BUN 13 02/04/2017 1541   CREATININE 0.74 02/04/2017 1541   CALCIUM 10.0 02/04/2017 1541   PROT  7.6 02/04/2017 1541   ALBUMIN 4.8 02/04/2017 1541   AST 19 02/04/2017 1541   ALT 9 02/04/2017 1541   ALKPHOS 107 02/04/2017 1541   BILITOT 0.5 02/04/2017 1541   GFRNONAA 116 02/04/2017 1541   GFRAA 134 02/04/2017 1541     MRI of the brain with and without contrast February 10, 2017:  IMPRESSION:  Normal MRI brain (with and without).  ASSESSMENT AND PLAN 22 y.o. year old female  has a past medical history of Migraines. here with:  1.  Migraine headaches  The patient headaches have not improved on gabapentin.  For now she will continue on gabapentin 300 mg TID.  We did discuss Botox therapy as well as Ajovy.  For now the patient wants to try Ajovy.  She was provided information about the drug as well as a co-pay savings card.  I discussed potential side effects with patient.  She verbalized understanding.  Patient advised that when she receives her medication if she needs help with the injection she should let us know.  She is advised that if her symptoms worsen or she develops new symptoms she should let us know.  He will follow-up in 3 months or sooner if needed.  I spent 15 minutes with the patient.  50% of this time was spent reviewing medication options.   [name], MSN, NP-C 06/06/2017, 3:01 PM [facility] [address] [place], [place] [address] [phone]

## 2017-07-16 NOTE — Progress Notes (Signed)
Personally  participated in, made any corrections needed, and agree with history, physical, neuro exam,assessment and plan as stated above.    Tanganyika Bowlds, MD [facility] 

## 2017-10-10 DIAGNOSIS — Z6828 Body mass index (BMI) 28.0-28.9, adult: Secondary | ICD-10-CM | POA: Diagnosis not present

## 2017-10-10 DIAGNOSIS — L259 Unspecified contact dermatitis, unspecified cause: Secondary | ICD-10-CM | POA: Diagnosis not present

## 2017-12-01 ENCOUNTER — Other Ambulatory Visit: Payer: Self-pay | Admitting: Adult Health

## 2017-12-03 NOTE — Telephone Encounter (Signed)
Refill for Ajovy submitted to [facility] in [place]. Patient is scheduled for f/u on 12/12/2017. MB RN.

## 2017-12-06 ENCOUNTER — Ambulatory Visit (HOSPITAL_COMMUNITY)
Admission: EM | Admit: 2017-12-06 | Discharge: 2017-12-06 | Disposition: A | Discharge status: Home / Self Care | Payer: BLUE CROSS/BLUE SHIELD | Attending: Family Medicine | Admitting: Family Medicine

## 2017-12-06 ENCOUNTER — Encounter (HOSPITAL_COMMUNITY): Payer: Self-pay | Admitting: Emergency Medicine

## 2017-12-06 DIAGNOSIS — R42 Dizziness and giddiness: Secondary | ICD-10-CM

## 2017-12-06 MED ORDER — MECLIZINE HCL 25 MG PO TABS: 25.0000 mg | ORAL_TABLET | Freq: Three times a day (TID) | ORAL | 0 refills | Status: DC | PRN

## 2017-12-06 NOTE — ED Provider Notes (Signed)
[facility]    CSN: [id number] Arrival date & time: 12/06/17  1224     History   Chief Complaint Chief Complaint  Patient presents with  . Dizziness    HPI Tracy Mcconnell is a 22 y.o. female.   HPI Patient is here for dizziness.  Is been present since Monday.  She states that she has a off-balance or spinning sensation when she turns her head or moves her eyes.  Is present even when she is lying in bed.  She is had no head injury.  She has no allergies.  No sinus congestion.  No ear pain.  No hearing loss.  No cough cold or runny nose symptoms.  No fever or chills. She does have a history of chronic migraine.  Her migraines have been better controlled since she was started on injection therapy. Past Medical History:  Diagnosis Date  . Migraines     Patient Active Problem List   Diagnosis Date Noted  . Chronic daily headache 02/04/2017    Past Surgical History:  Procedure Laterality Date  . NO PAST SURGERIES      OB History   None      Home Medications    Prior to Admission medications   Medication Sig Start Date End Date Taking? Authorizing Provider  acetaminophen (TYLENOL) 500 MG tablet Take 500 mg by mouth every 6 (six) hours as needed (pain).    [provider]  AJOVY 225 MG/1.5ML SOSY INJECT 225 MG (1.5 ML) INTO THE SKIN EVERY 30 DAYS 12/03/17   [name], NP  buPROPion (WELLBUTRIN XL) 150 MG 24 hr tablet  05/09/17   [provider]  busPIRone (BUSPAR) 10 MG tablet  05/09/17   [provider]  cloNIDine (CATAPRES) 0.1 MG tablet Take 0.1-0.2 mg at bedtime as needed by mouth.    [provider]  escitalopram (LEXAPRO) 20 MG tablet Take 20 mg by mouth at bedtime.    [provider]  gabapentin (NEURONTIN) 300 MG capsule Take 1 capsule (300 mg total) 3 (three) times daily by mouth. 02/04/17   Anson Fret, MD  ibuprofen (ADVIL,MOTRIN) 400 MG tablet Take 1 tablet (400 mg total) by mouth every 6  (six) hours as needed. 02/27/16   Fayrene Helper, PA-C  meclizine (ANTIVERT) 25 MG tablet Take 1 tablet (25 mg total) by mouth 3 (three) times daily as needed for dizziness. 12/06/17   Eustace Moore, MD    Family History Family History  Problem Relation Age of Onset  . Migraines Mother   . Thyroid disease Mother   . Diabetes Paternal Grandfather     Social History Social History   Tobacco Use  . Smoking status: Never Smoker  . Smokeless tobacco: Never Used  Substance Use Topics  . Alcohol use: No  . Drug use: No     Allergies   Imitrex [sumatriptan]   Review of Systems Review of Systems  Constitutional: Negative for chills and fever.  HENT: Negative for ear pain and sore throat.   Eyes: Negative for pain and visual disturbance.  Respiratory: Negative for cough and shortness of breath.   Cardiovascular: Negative for chest pain and palpitations.  Gastrointestinal: Positive for nausea. Negative for abdominal pain and vomiting.  Genitourinary: Negative for dysuria and hematuria.  Musculoskeletal: Negative for arthralgias and back pain.  Skin: Negative for color change and rash.  Neurological: Positive for dizziness. Negative for seizures and syncope.  All other systems reviewed and are negative.  Physical Exam Triage Vital Signs ED Triage Vitals [12/06/17 1256]  Enc Vitals Group     BP 118/78     Pulse Rate 77     Resp 18     Temp 98.5 F (36.9 C)     Temp Source Oral     SpO2 98 %   No data found.  Updated Vital Signs BP 118/78 (BP Location: Right Arm)   Pulse 77   Temp 98.5 F (36.9 C) (Oral)   Resp 18   SpO2 98%      Physical Exam  Constitutional: She appears well-developed and well-nourished. No distress.  HENT:  Head: Normocephalic and atraumatic.  Right Ear: External ear normal.  Left Ear: External ear normal.  Mouth/Throat: Oropharynx is clear and moist.  Normal HEENT exam  Eyes: Pupils are equal, round, and reactive to light.  Conjunctivae and EOM are normal.  Nystagmus with left gaze  Neck: Normal range of motion. Neck supple. No thyromegaly present.  Cardiovascular: Normal rate, regular rhythm and normal heart sounds.  Pulmonary/Chest: Effort normal and breath sounds normal. No respiratory distress.  Abdominal: Soft. She exhibits no distension.  Musculoskeletal: Normal range of motion. She exhibits no edema.  Lymphadenopathy:    She has no cervical adenopathy.  Neurological: She is alert.  Skin: Skin is warm and dry.  Psychiatric: She has a normal mood and affect. Her behavior is normal.     UC Treatments / Results  Labs (all labs ordered are listed, but only abnormal results are displayed) Labs Reviewed - No data to display  EKG None  Radiology No results found.  Procedures Procedures (including critical care time)  Medications Ordered in UC Medications - No data to display  Initial Impression / Assessment and Plan / UC Course  I have reviewed the triage vital signs and the nursing notes.  Pertinent labs & imaging results that were available during my care of the patient were reviewed by me and considered in my medical decision making (see chart for details).     Discussed vertigo.  Causes.  Treatment.  Follow-up. Final Clinical Impressions(s) / UC Diagnoses   Final diagnoses:  Vertigo     Discharge Instructions     Drink plenty of water Take the Antivert up to 3 times a day to help with dizziness Off work for the weekend See your primary care doctor if not better by Monday   ED Prescriptions    Medication Sig Dispense Auth. Provider   meclizine (ANTIVERT) 25 MG tablet Take 1 tablet (25 mg total) by mouth 3 (three) times daily as needed for dizziness. 30 tablet Eustace Moore, MD     Controlled Substance Prescriptions [place] Controlled Substance Registry consulted? n/a   Eustace Moore, MD 12/06/17 1351

## 2017-12-06 NOTE — Discharge Instructions (Signed)
Drink plenty of water Take the Antivert up to 3 times a day to help with dizziness Off work for the weekend See your primary care doctor if not better by Monday

## 2017-12-06 NOTE — ED Triage Notes (Signed)
Pt sts dizziness and nausea worse with position change

## 2017-12-12 ENCOUNTER — Telehealth: Payer: Self-pay | Admitting: *Deleted

## 2017-12-12 ENCOUNTER — Encounter: Payer: Self-pay | Admitting: Adult Health

## 2017-12-12 ENCOUNTER — Ambulatory Visit: Payer: BLUE CROSS/BLUE SHIELD | Admitting: Adult Health

## 2017-12-12 ENCOUNTER — Encounter

## 2017-12-12 NOTE — Progress Notes (Deleted)
PATIENT: Tracy Mcconnell DOB: 1996-02-14  REASON FOR VISIT: follow up HISTORY FROM: patient  HISTORY OF PRESENT ILLNESS: Today 12/12/17  HISTORY 06/06/17  [name] is a 22 year old female with a history of migraine headaches.  She returns today for follow-up.  She was placed on gabapentin 300 mg 3 times a day at the last visit.  She reports that it has not offered her any benefit with her headaches.  She continues to have approximately 4 migraines a month reports that 1 migraine can last almost a full week sometimes.  She states that she only has 10 headache free days out of the month.  She states that the headaches are behind her eyes and spread all over her head.  She does have photophobia, phonophobia and nausea.  Denies any vomiting.  She denies any numbness or tingling in extremities.  In the past she has tried Imitrex, Topamax and amitriptyline but was unable to tolerate these medications.  She returns today for evaluation.   REVIEW OF SYSTEMS: Out of a complete 14 system review of symptoms, the patient complains only of the following symptoms, and all other reviewed systems are negative.  ALLERGIES: Allergies  Allergen Reactions  . Imitrex [Sumatriptan] Shortness Of Breath    Entire body felt heavy    HOME MEDICATIONS: Outpatient Medications Prior to Visit  Medication Sig Dispense Refill  . acetaminophen (TYLENOL) 500 MG tablet Take 500 mg by mouth every 6 (six) hours as needed (pain).    [name] AJOVY 225 MG/1.5ML SOSY INJECT 225 MG (1.5 ML) INTO THE SKIN EVERY 30 DAYS 1.5 Syringe 1  . buPROPion (WELLBUTRIN XL) 150 MG 24 hr tablet   0  . busPIRone (BUSPAR) 10 MG tablet   0  . cloNIDine (CATAPRES) 0.1 MG tablet Take 0.1-0.2 mg at bedtime as needed by mouth.    . escitalopram (LEXAPRO) 20 MG tablet Take 20 mg by mouth at bedtime.    . gabapentin (NEURONTIN) 300 MG capsule Take 1 capsule (300 mg total) 3 (three) times daily by mouth. 90 capsule 11  . ibuprofen  (ADVIL,MOTRIN) 400 MG tablet Take 1 tablet (400 mg total) by mouth every 6 (six) hours as needed. 30 tablet 0  . meclizine (ANTIVERT) 25 MG tablet Take 1 tablet (25 mg total) by mouth 3 (three) times daily as needed for dizziness. 30 tablet 0   No facility-administered medications prior to visit.     PAST MEDICAL HISTORY: Past Medical History:  Diagnosis Date  . Migraines     PAST SURGICAL HISTORY: Past Surgical History:  Procedure Laterality Date  . NO PAST SURGERIES      FAMILY HISTORY: Family History  Problem Relation Age of Onset  . Migraines Mother   . Thyroid disease Mother   . Diabetes Paternal Grandfather     SOCIAL HISTORY: Social History   Socioeconomic History  . Marital status: Single    Spouse name: Not on file  . Number of children: 0  . Years of education: college student  . Highest education level: Not on file  Occupational History  . Not on file  Social Needs  . Financial resource strain: Not on file  . Food insecurity:    Worry: Not on file    Inability: Not on file  . Transportation needs:    Medical: No    Non-medical: No  Tobacco Use  . Smoking status: Never Smoker  . Smokeless tobacco: Never Used  Substance and Sexual Activity  .  Alcohol use: No  . Drug use: No  . Sexual activity: Not on file  Lifestyle  . Physical activity:    Days per week: Not on file    Minutes per session: Not on file  . Stress: Not on file  Relationships  . Social connections:    Talks on phone: Not on file    Gets together: Not on file    Attends religious service: Not on file    Active member of club or organization: Not on file    Attends meetings of clubs or organizations: Not on file    Relationship status: Not on file  . Intimate partner violence:    Fear of current or ex partner: Not on file    Emotionally abused: Not on file    Physically abused: Not on file    Forced sexual activity: Not on file  Other Topics Concern  . Not on file  Social  History Narrative   Lives at home alone   Right handed   Does not drink caffeine, makes her nauseated      PHYSICAL EXAM  There were no vitals filed for this visit. There is no height or weight on file to calculate BMI.  Generalized: Well developed, in no acute distress   Neurological examination  Mentation: Alert oriented to time, place, history taking. Follows all commands speech and language fluent Cranial nerve II-XII: Pupils were equal round reactive to light. Extraocular movements were full, visual field were full on confrontational test. Facial sensation and strength were normal. Uvula tongue midline. Head turning and shoulder shrug  were normal and symmetric. Motor: The motor testing reveals 5 over 5 strength of all 4 extremities. Good symmetric motor tone is noted throughout.  Sensory: Sensory testing is intact to soft touch on all 4 extremities. No evidence of extinction is noted.  Coordination: Cerebellar testing reveals good finger-nose-finger and heel-to-shin bilaterally.  Gait and station: Gait is normal. Tandem gait is normal. Romberg is negative. No drift is seen.  Reflexes: Deep tendon reflexes are symmetric and normal bilaterally.   DIAGNOSTIC DATA (LABS, IMAGING, TESTING) - I reviewed patient records, labs, notes, testing and imaging myself where available.  Lab Results  Component Value Date   WBC 9.7 02/04/2017   HGB 13.4 02/04/2017   HCT 39.9 02/04/2017   MCV 85 02/04/2017   PLT 392 (H) 02/04/2017      Component Value Date/Time   NA 138 02/04/2017 1541   K 4.2 02/04/2017 1541   CL 100 02/04/2017 1541   CO2 19 (L) 02/04/2017 1541   GLUCOSE 73 02/04/2017 1541   GLUCOSE 101 (H) 02/29/2016 1513   BUN 13 02/04/2017 1541   CREATININE 0.74 02/04/2017 1541   CALCIUM 10.0 02/04/2017 1541   PROT 7.6 02/04/2017 1541   ALBUMIN 4.8 02/04/2017 1541   AST 19 02/04/2017 1541   ALT 9 02/04/2017 1541   ALKPHOS 107 02/04/2017 1541   BILITOT 0.5 02/04/2017 1541    GFRNONAA 116 02/04/2017 1541   GFRAA 134 02/04/2017 1541   No results found for: CHOL, HDL, LDLCALC, LDLDIRECT, TRIG, CHOLHDL No results found for: ZOXW9U No results found for: VITAMINB12 Lab Results  Component Value Date   TSH 2.240 02/04/2017      ASSESSMENT AND PLAN 22 y.o. year old female  has a past medical history of Migraines. here with ***   I spent 15 minutes with the patient. 50% of this time was spent   [name], MSN, NP-C  12/12/2017, 8:24 AM [facility] [address] [place], [place] [address] [phone]

## 2017-12-12 NOTE — Telephone Encounter (Signed)
Patient was no show for follow up with NP today.  

## 2017-12-16 ENCOUNTER — Ambulatory Visit: Payer: BLUE CROSS/BLUE SHIELD | Admitting: Adult Health

## 2017-12-16 ENCOUNTER — Encounter: Payer: Self-pay | Admitting: Adult Health

## 2017-12-16 VITALS — BP 110/66 | HR 72 | Ht 61.0 in | Wt 125.4 lb

## 2017-12-16 DIAGNOSIS — G43009 Migraine without aura, not intractable, without status migrainosus: Secondary | ICD-10-CM

## 2017-12-16 DIAGNOSIS — R42 Dizziness and giddiness: Secondary | ICD-10-CM | POA: Diagnosis not present

## 2017-12-16 NOTE — Patient Instructions (Signed)
Your Plan:  Continue Ajovy and gabapentin Referral to neuro rehab to eval for vertigo If dizziness significantly worsens please let us know. If you have any other concerning symptoms you can let us know or go to the ED  Thank you for coming to see [place] at [facility]. I hope we have been able to provide you high quality care today.  You may receive a patient satisfaction survey over the next few weeks. We would appreciate your feedback and comments so that we may continue to improve ourselves and the health of our patients.

## 2017-12-16 NOTE — Progress Notes (Signed)
PATIENT: Tracy Mcconnell DOB: February 29, 1996  REASON FOR VISIT: follow up HISTORY FROM: patient  HISTORY OF PRESENT ILLNESS: Today 12/16/17:  Tracy Mcconnell is a 22 year old female with a history of migraine.  She returns today for follow-up.  She remains on gabapentin 300 mg 3 times a day as well as a Ajovy.  She states that she has been taking Ajovy for 5 to 6 months.  She reports that her headaches have significantly improved.  She reports that she has 5 migraine a month.  Her headaches still occur behind the eyes and typically starts on the right and then moves to the left.  She does have photophobia and phonophobia as well as nausea and vomiting.  She states that when she typically gets a headache she will use an ice pack to go to sleep or she may use ibuprofen.  She reports that her headache tends to resolve in 2 to 3 hours.  She reports that starting about 2 weeks ago she began having dizziness.  She states it occurs when she looks to the right or the left.  She did go to urgent care and they diagnosed her  with vertigo and gave her Antivert.  She states that has been helping but she continues to have dizziness.  She states that it occurs on a daily basis.  She also reports that starting 2 days ago she began to have numbness in the toes.  This is limited to the toes.  The patient's report of numbness and dizziness does not coincide with her migraine headaches.  She returns today for evaluation.[name]  HISTORY 06/06/17  Tracy Mcconnell is a 22 year old female with a history of migraine headaches.  She returns today for follow-up.  She was placed on gabapentin 300 mg 3 times a day at the last visit.  She reports that it has not offered her any benefit with her headaches.  She continues to have approximately 4 migraines a month reports that 1 migraine can last almost a full week sometimes.  She states that she only has 10 headache free days out of the month.  She states that the headaches are behind her  eyes and spread all over her head.  She does have photophobia, phonophobia and nausea.  Denies any vomiting.  She denies any numbness or tingling in extremities.  In the past she has tried Imitrex, Topamax and amitriptyline but was unable to tolerate these medications.  She returns today for evaluation.  HISTORY Tracy [name] a 22 y.o.femalehere as a referral from Dr. Franchot Mimes migraines, anxiety. She has a FHx of migraines in mother. She has had migraines since the 5. Her migraines are unilateral, move to the other side, behind the eyes, squeezing, pulsating, bad nausea but no vomiting, she has light and sound sensitivity, she has to sit perfectly still because movement exacerbates it. She has headaches every day, she wakes up with them. If she spends too much time in the sunlight she can get a migraine, stress. She has had daily headaches in the last year, can last all day long some migraines last a week, every headache is migrainous. No medication overuse. Rarely she has an aura, she can have a cloud sometimes on the left but not always. Not sleeping at night, there is a lot of stress. Laying down in the dark helps. Grandmother with brain tumor, unknown kind.  Reviewed notes, labs and imaging from outside physicians, which showed:   Medications tried for migraine: Sertraline, topiramate(for a  year, 100mg ), amitriptyline(for a year), Imitrex(bad reaction), clonidine.  Patient has 2-4 migraines per month. Failure or intolerance to Imitrex, Topamax and amitriptyline. She has a history of migraine headache with and without aura, currently having several headaches per month, has been unable to tolerate multiple medications, no status migrainosus.  Reviewed emergency room notes she has been in the emergency roomm ultiple times in the last year for anxiety presenting as chest pain and dizziness, chest x-ray was normal, heart racing, chest pain, sharp in the middle of her chest, lightheaded but no  syncopal episode.    REVIEW OF SYSTEMS: Out of a complete 14 system review of symptoms, the patient complains only of the following symptoms, and all other reviewed systems are negative.  See HPI  ALLERGIES: Allergies  Allergen Reactions  . Imitrex [Sumatriptan] Shortness Of Breath    Entire body felt heavy    HOME MEDICATIONS: Outpatient Medications Prior to Visit  Medication Sig Dispense Refill  . acetaminophen (TYLENOL) 500 MG tablet Take 500 mg by mouth every 6 (six) hours as needed (pain).    [name] AJOVY 225 MG/1.5ML SOSY INJECT 225 MG (1.5 ML) INTO THE SKIN EVERY 30 DAYS 1.5 Syringe 1  . buPROPion (WELLBUTRIN XL) 150 MG 24 hr tablet   0  . busPIRone (BUSPAR) 10 MG tablet   0  . cloNIDine (CATAPRES) 0.1 MG tablet Take 0.1-0.2 mg at bedtime as needed by mouth.    . escitalopram (LEXAPRO) 20 MG tablet Take 20 mg by mouth at bedtime.    . gabapentin (NEURONTIN) 300 MG capsule Take 1 capsule (300 mg total) 3 (three) times daily by mouth. 90 capsule 11  . ibuprofen (ADVIL,MOTRIN) 400 MG tablet Take 1 tablet (400 mg total) by mouth every 6 (six) hours as needed. 30 tablet 0  . meclizine (ANTIVERT) 25 MG tablet Take 1 tablet (25 mg total) by mouth 3 (three) times daily as needed for dizziness. 30 tablet 0   No facility-administered medications prior to visit.     PAST MEDICAL HISTORY: Past Medical History:  Diagnosis Date  . Migraines     PAST SURGICAL HISTORY: Past Surgical History:  Procedure Laterality Date  . NO PAST SURGERIES      FAMILY HISTORY: Family History  Problem Relation Age of Onset  . Migraines Mother   . Thyroid disease Mother   . Diabetes Paternal Grandfather     SOCIAL HISTORY: Social History   Socioeconomic History  . Marital status: Single    Spouse name: Not on file  . Number of children: 0  . Years of education: college student  . Highest education level: Not on file  Occupational History  . Not on file  Social Needs  . Financial  resource strain: Not on file  . Food insecurity:    Worry: Not on file    Inability: Not on file  . Transportation needs:    Medical: No    Non-medical: No  Tobacco Use  . Smoking status: Never Smoker  . Smokeless tobacco: Never Used  Substance and Sexual Activity  . Alcohol use: No  . Drug use: No  . Sexual activity: Not on file  Lifestyle  . Physical activity:    Days per week: Not on file    Minutes per session: Not on file  . Stress: Not on file  Relationships  . Social connections:    Talks on phone: Not on file    Gets together: Not on file  Attends religious service: Not on file    Active member of club or organization: Not on file    Attends meetings of clubs or organizations: Not on file    Relationship status: Not on file  . Intimate partner violence:    Fear of current or ex partner: Not on file    Emotionally abused: Not on file    Physically abused: Not on file    Forced sexual activity: Not on file  Other Topics Concern  . Not on file  Social History Narrative   Lives at home alone   Right handed   Does not drink caffeine, makes her nauseated      PHYSICAL EXAM  Vitals:   12/16/17 0745  BP: 110/66  Pulse: 72  Weight: 125 lb 6.4 oz (56.9 kg)  Height: 5\' 1"  (1.549 m)   Body mass index is 23.69 kg/m.  Generalized: Well developed, in no acute distress   Neurological examination  Mentation: Alert oriented to time, place, history taking. Follows all commands speech and language fluent Cranial nerve II-XII: Pupils were equal round reactive to light.  Extraocular movements were full, visual field were full on confrontational test.  In the nystagmus noted when looking horizontally to the right and left.  Facial sensation and strength were normal. Uvula tongue midline. Head turning and shoulder shrug  were normal and symmetric. Motor: The motor testing reveals 5 over 5 strength of all 4 extremities. Good symmetric motor tone is noted throughout.    Sensory: Sensory testing is intact to soft touch on all 4 extremities. No evidence of extinction is noted.  Coordination: Cerebellar testing reveals good finger-nose-finger and heel-to-shin bilaterally.  Gait and station: Gait is normal. Tandem gait is normal. Romberg is negative. No drift is seen.  Reflexes: Deep tendon reflexes are symmetric and normal bilaterally.   DIAGNOSTIC DATA (LABS, IMAGING, TESTING) - I reviewed patient records, labs, notes, testing and imaging myself where available.  Lab Results  Component Value Date   WBC 9.7 02/04/2017   HGB 13.4 02/04/2017   HCT 39.9 02/04/2017   MCV 85 02/04/2017   PLT 392 (H) 02/04/2017      Component Value Date/Time   NA 138 02/04/2017 1541   K 4.2 02/04/2017 1541   CL 100 02/04/2017 1541   CO2 19 (L) 02/04/2017 1541   GLUCOSE 73 02/04/2017 1541   GLUCOSE 101 (H) 02/29/2016 1513   BUN 13 02/04/2017 1541   CREATININE 0.74 02/04/2017 1541   CALCIUM 10.0 02/04/2017 1541   PROT 7.6 02/04/2017 1541   ALBUMIN 4.8 02/04/2017 1541   AST 19 02/04/2017 1541   ALT 9 02/04/2017 1541   ALKPHOS 107 02/04/2017 1541   BILITOT 0.5 02/04/2017 1541   GFRNONAA 116 02/04/2017 1541   GFRAA 134 02/04/2017 1541    Lab Results  Component Value Date   TSH 2.240 02/04/2017      ASSESSMENT AND PLAN 22 y.o. year old female  has a past medical history of Migraines. here with:  1.  Migraine headaches 2.  Vertigo 3.  Numbness  The patient will continue on gabapentin and Ajovy for her headaches.  If they begin to increase in frequency or severity she should let us know.  I will refer to neuro rehab for evaluation of benign positional vertigo.  The numbness in the toes started approximately 2 days ago.  We will monitor this for now.  If it continues or if it worsens she is to let us know.  At that time we may consider nerve conduction studies.  Patient voiced understanding.  I advised patient that if she develops any concerning symptoms she  should let us know or go to the emergency room.  She will follow-up in 6 months or sooner if needed.    [name], MSN, NP-C 12/16/2017, 8:22 AM [facility] [address], [address] [place], [place] [address] [phone]

## 2017-12-17 NOTE — Progress Notes (Signed)
Made any corrections needed, and agree with history, physical, neuro exam,assessment and plan as stated above.     Aarya Quebedeaux, MD [facility] 

## 2017-12-21 ENCOUNTER — Emergency Department (HOSPITAL_COMMUNITY): Payer: BLUE CROSS/BLUE SHIELD

## 2017-12-21 ENCOUNTER — Other Ambulatory Visit: Payer: Self-pay

## 2017-12-21 ENCOUNTER — Encounter (HOSPITAL_COMMUNITY): Payer: Self-pay | Admitting: *Deleted

## 2017-12-21 ENCOUNTER — Emergency Department (HOSPITAL_COMMUNITY)
Admission: EM | Admit: 2017-12-21 | Discharge: 2017-12-21 | Disposition: A | Discharge status: Home / Self Care | Payer: BLUE CROSS/BLUE SHIELD | Attending: Emergency Medicine | Admitting: Emergency Medicine

## 2017-12-21 DIAGNOSIS — Z79899 Other long term (current) drug therapy: Secondary | ICD-10-CM | POA: Diagnosis not present

## 2017-12-21 DIAGNOSIS — H81399 Other peripheral vertigo, unspecified ear: Secondary | ICD-10-CM

## 2017-12-21 DIAGNOSIS — G43809 Other migraine, not intractable, without status migrainosus: Secondary | ICD-10-CM | POA: Diagnosis not present

## 2017-12-21 DIAGNOSIS — G43109 Migraine with aura, not intractable, without status migrainosus: Secondary | ICD-10-CM

## 2017-12-21 DIAGNOSIS — R42 Dizziness and giddiness: Secondary | ICD-10-CM | POA: Diagnosis not present

## 2017-12-21 DIAGNOSIS — R200 Anesthesia of skin: Secondary | ICD-10-CM | POA: Diagnosis not present

## 2017-12-21 MED ORDER — MECLIZINE HCL 25 MG PO TABS: 25.0000 mg | ORAL_TABLET | Freq: Three times a day (TID) | ORAL | 0 refills | Status: DC | PRN

## 2017-12-21 MED ORDER — MECLIZINE HCL 25 MG PO TABS
25.0000 mg | ORAL_TABLET | Freq: Once | ORAL | Status: AC
  Administered 2017-12-21: 25 mg via ORAL
  Filled 2017-12-21: qty 1

## 2017-12-21 MED ORDER — SODIUM CHLORIDE 0.9 % IV BOLUS: 1000.0000 mL | Freq: Once | INTRAVENOUS | Status: DC

## 2017-12-21 MED ORDER — IBUPROFEN 200 MG PO TABS
600.0000 mg | ORAL_TABLET | Freq: Once | ORAL | Status: AC
  Administered 2017-12-21: 600 mg via ORAL
  Filled 2017-12-21: qty 3

## 2017-12-21 NOTE — ED Triage Notes (Signed)
Pt complains of dizziness x 3 weeks, numbness in bilateral feet and right lower leg. Pt has hx of chronic migraines, pt was told to come to ED by neurologist if symptoms worsen.

## 2017-12-21 NOTE — ED Provider Notes (Signed)
[facility]-EMERGENCY DEPT Provider Note   CSN: [id number] Arrival date & time: 12/21/17  1717     History   Chief Complaint Chief Complaint  Patient presents with  . Dizziness    HPI Tracy Mcconnell is a 22 y.o. female history of complex migraines followed up with neurology here presenting with persistent dizziness, right leg numbness.  Patient states that she has been having dizzy sensation and vertiginous symptoms for the last several weeks.  She also has some numbness in the right foot for several weeks.  This is well-documented when she saw her neurologist about 5 days ago.  She was thought to have peripheral ago as well as complex migraine at that time.  She does have persistent headaches that is mild and she has persistent vertigo.  She states that she has slightly worsening numbness that travel from the bottom of her foot now to the mid calf area.  She states that she is walking well and has no weakness.  She has baseline double vision. She is on gabapentin and Ajvoy for headaches.   The history is provided by the patient.    Past Medical History:  Diagnosis Date  . Migraines     Patient Active Problem List   Diagnosis Date Noted  . Chronic daily headache 02/04/2017    Past Surgical History:  Procedure Laterality Date  . NO PAST SURGERIES       OB History   None      Home Medications    Prior to Admission medications   Medication Sig Start Date End Date Taking? Authorizing Provider  acetaminophen (TYLENOL) 500 MG tablet Take 500 mg by mouth every 6 (six) hours as needed (pain).    [provider]  AJOVY 225 MG/1.5ML SOSY INJECT 225 MG (1.5 ML) INTO THE SKIN EVERY 30 DAYS 12/03/17   [name], NP  buPROPion (WELLBUTRIN XL) 150 MG 24 hr tablet  05/09/17   [provider]  busPIRone (BUSPAR) 10 MG tablet  05/09/17   [provider]  cloNIDine (CATAPRES) 0.1 MG tablet Take 0.1-0.2 mg at bedtime as needed by  mouth.    [provider]  escitalopram (LEXAPRO) 20 MG tablet Take 20 mg by mouth at bedtime.    [provider]  gabapentin (NEURONTIN) 300 MG capsule Take 1 capsule (300 mg total) 3 (three) times daily by mouth. 02/04/17   Anson Fret, MD  ibuprofen (ADVIL,MOTRIN) 400 MG tablet Take 1 tablet (400 mg total) by mouth every 6 (six) hours as needed. 02/27/16   Fayrene Helper, PA-C  meclizine (ANTIVERT) 25 MG tablet Take 1 tablet (25 mg total) by mouth 3 (three) times daily as needed for dizziness. 12/06/17   Eustace Moore, MD    Family History Family History  Problem Relation Age of Onset  . Migraines Mother   . Thyroid disease Mother   . Diabetes Paternal Grandfather     Social History Social History   Tobacco Use  . Smoking status: Never Smoker  . Smokeless tobacco: Never Used  Substance Use Topics  . Alcohol use: No  . Drug use: No     Allergies   Imitrex [sumatriptan]   Review of Systems Review of Systems  Neurological: Positive for dizziness.  All other systems reviewed and are negative.    Physical Exam Updated Vital Signs BP 118/80   Pulse 98   Temp 98.4 F (36.9 C) (Oral)   Resp 18   LMP 12/07/2017  SpO2 95%   Physical Exam  Constitutional: She is oriented to person, place, and time. She appears well-developed and well-nourished.  HENT:  Head: Normocephalic.  Mouth/Throat: Oropharynx is clear and moist.  Eyes: Pupils are equal, round, and reactive to light. Conjunctivae and EOM are normal.  Neck: Normal range of motion. Neck supple.  Cardiovascular: Normal rate.  Pulmonary/Chest: Effort normal and breath sounds normal. No stridor. No respiratory distress.  Abdominal: Soft. Bowel sounds are normal. She exhibits no distension. There is no tenderness.  Musculoskeletal: Normal range of motion.  Neurological: She is alert and oriented to person, place, and time. No cranial nerve deficit. Coordination normal.  CN 2- 12 intact. No  obvious nystagmus. Nl strength throughout. Nl finger to nose bilaterally. Slightly dec sensation entire R foot but motor strength normal. Nl reflexes bilaterally. Nl gait   Skin: Skin is warm. Capillary refill takes less than 2 seconds.  Psychiatric: She has a normal mood and affect.  Nursing note and vitals reviewed.    ED Treatments / Results  Labs (all labs ordered are listed, but only abnormal results are displayed) Labs Reviewed - No data to display  EKG None  Radiology Ct Head Wo Contrast  Result Date: 12/21/2017 CLINICAL DATA:  Dizziness for the past 3 weeks. Bilateral foot and right lower leg numbness. EXAM: CT HEAD WITHOUT CONTRAST TECHNIQUE: Contiguous axial images were obtained from the base of the skull through the vertex without intravenous contrast. COMPARISON:  Brain MR dated 02/12/2017. FINDINGS: Brain: Normal appearing cerebral hemispheres and posterior fossa structures. Normal size and position of the ventricles. No intracranial hemorrhage, mass lesion or CT evidence of acute infarction. Vascular: No hyperdense vessel or unexpected calcification. Skull: Normal. Negative for fracture or focal lesion. Sinuses/Orbits: Unremarkable. Other: None. IMPRESSION: Normal examination. Electronically Signed   By: Beckie Salts M.D.   On: 12/21/2017 18:26    Procedures Procedures (including critical care time)  Medications Ordered in ED Medications  ibuprofen (ADVIL,MOTRIN) tablet 600 mg (has no administration in time range)  meclizine (ANTIVERT) tablet 25 mg (25 mg Oral Given 12/21/17 1753)     Initial Impression / Assessment and Plan / ED Course  I have reviewed the triage vital signs and the nursing notes.  Pertinent labs & imaging results that were available during my care of the patient were reviewed by me and considered in my medical decision making (see chart for details).    Tracy Mcconnell is a 22 y.o. female here with dizziness, headaches, R foot numbness. I  think likely complex migraines. She had MRI less than a year ago that showed no multiple sclerosis or obvious abnormalities. She has neurology follow up and recently diagnosed with peripheral vertigo. Her numbness is in the stocking distribution and she has no hx of diabetes. Will get CT head. Will give motrin, meclizine.   7:21 PM CT head unremarkable. Given meclizine, motrin for felt better. Will dc home with meclizine prn, neuro follow up.    Final Clinical Impressions(s) / ED Diagnoses   Final diagnoses:  None    ED Discharge Orders    None       Charlynne Pander, MD 12/21/17 1921

## 2017-12-21 NOTE — Discharge Instructions (Signed)
Take meclizine as needed for dizziness.   Continue your current meds for headache.   See your neurologist for follow up   Return to ER if you have worse dizziness, numbness, headaches, vomiting

## 2018-01-26 ENCOUNTER — Other Ambulatory Visit: Payer: Self-pay | Admitting: Adult Health

## 2018-01-26 ENCOUNTER — Other Ambulatory Visit: Payer: Self-pay | Admitting: Neurology

## 2018-06-04 ENCOUNTER — Encounter (HOSPITAL_COMMUNITY): Payer: Self-pay

## 2018-06-04 ENCOUNTER — Ambulatory Visit (HOSPITAL_COMMUNITY)
Admission: EM | Admit: 2018-06-04 | Discharge: 2018-06-04 | Disposition: A | Discharge status: Home / Self Care | Payer: BLUE CROSS/BLUE SHIELD | Attending: Family Medicine | Admitting: Family Medicine

## 2018-06-04 DIAGNOSIS — R590 Localized enlarged lymph nodes: Secondary | ICD-10-CM

## 2018-06-04 NOTE — Discharge Instructions (Addendum)
I believe that this is a swollen lymph node If you start developing any worsening symptoms to include fever, sore throat, ear pain, worsening swelling please follow-up You can take ibuprofen for the pain and inflammation.

## 2018-06-04 NOTE — ED Triage Notes (Signed)
Pt presents with a knot behind right ear that is tender to touch.

## 2018-06-06 NOTE — ED Provider Notes (Addendum)
[facility]    CSN: [id number] Arrival date & time: 06/04/18  1446     History   Chief Complaint Chief Complaint  Patient presents with  . Knot behind Right Ear    HPI Tracy Mcconnell is a 23 y.o. female.   Patient is a 23 year old female who presents with swelling behind the right ear.  This is been present for approximately 3 to 4 days.  The swelling has somewhat worsened.  The area is mildly tender to touch.  She has not done anything to treat his symptoms.  She denies any associated fevers, chills, night sweats, myalgias, cough, ear pain, sore throat, dental pain, congestion, nausea, vomiting, diarrhea.  No ear pain.  No injury  ROS per HPI      Past Medical History:  Diagnosis Date  . Migraines     Patient Active Problem List   Diagnosis Date Noted  . Chronic daily headache 02/04/2017    Past Surgical History:  Procedure Laterality Date  . NO PAST SURGERIES      OB History   No obstetric history on file.      Home Medications    Prior to Admission medications   Medication Sig Start Date End Date Taking? Authorizing Provider  acetaminophen (TYLENOL) 500 MG tablet Take 500 mg by mouth every 6 (six) hours as needed (pain).    [provider]  AJOVY 225 MG/1.5ML SOSY INJECT 225 MG (1.5 ML) INTO THE SKIN EVERY 30 DAYS 01/27/18   [name], NP  buPROPion (WELLBUTRIN XL) 150 MG 24 hr tablet  05/09/17   [provider]  busPIRone (BUSPAR) 10 MG tablet  05/09/17   [provider]  cloNIDine (CATAPRES) 0.1 MG tablet Take 0.1-0.2 mg at bedtime as needed by mouth.    [provider]  escitalopram (LEXAPRO) 20 MG tablet Take 20 mg by mouth at bedtime.    [provider]  gabapentin (NEURONTIN) 300 MG capsule TAKE 1 CAPSULE BY MOUTH THREE TIMES A DAY 01/27/18   Anson Fret, MD  ibuprofen (ADVIL,MOTRIN) 400 MG tablet Take 1 tablet (400 mg total) by mouth every 6 (six) hours as needed. 02/27/16    Fayrene Helper, PA-C  meclizine (ANTIVERT) 25 MG tablet Take 1 tablet (25 mg total) by mouth 3 (three) times daily as needed for dizziness. 12/21/17   Charlynne Pander, MD    Family History Family History  Problem Relation Age of Onset  . Migraines Mother   . Thyroid disease Mother   . Diabetes Paternal Grandfather     Social History Social History   Tobacco Use  . Smoking status: Never Smoker  . Smokeless tobacco: Never Used  Substance Use Topics  . Alcohol use: No  . Drug use: No     Allergies   Imitrex [sumatriptan]   Review of Systems Review of Systems   Physical Exam Triage Vital Signs ED Triage Vitals  Enc Vitals Group     BP 06/04/18 1527 112/63     Pulse Rate 06/04/18 1527 80     Resp 06/04/18 1527 18     Temp 06/04/18 1527 98.3 F (36.8 C)     Temp Source 06/04/18 1527 Oral     SpO2 06/04/18 1527 100 %     Weight --      Height --      Head Circumference --      Peak Flow --      Pain Score 06/04/18 1526 5  Pain Loc --      Pain Edu? --      Excl. in GC? --    No data found.  Updated Vital Signs BP 112/63 (BP Location: Right Arm)   Pulse 80   Temp 98.3 F (36.8 C) (Oral)   Resp 18   LMP 05/27/2018   SpO2 100%   Visual Acuity Right Eye Distance:   Left Eye Distance:   Bilateral Distance:    Right Eye Near:   Left Eye Near:    Bilateral Near:     Physical Exam Vitals signs and nursing note reviewed.  Constitutional:      General: She is not in acute distress.    Appearance: Normal appearance. She is normal weight. She is not ill-appearing, toxic-appearing or diaphoretic.  HENT:     Head: Normocephalic and atraumatic.      Comments: Swelling and mild tenderness to the right lateral neck, cervical chain, just below the ear. No erythema or tenderness to the mastoid.     Right Ear: Tympanic membrane and ear canal normal.     Left Ear: Tympanic membrane and ear canal normal.     Nose: Nose normal.     Mouth/Throat:     Pharynx:  Oropharynx is clear.  Eyes:     Conjunctiva/sclera: Conjunctivae normal.  Neck:     Musculoskeletal: Normal range of motion.  Cardiovascular:     Rate and Rhythm: Normal rate and regular rhythm.     Pulses: Normal pulses.     Heart sounds: Normal heart sounds.  Pulmonary:     Effort: Pulmonary effort is normal.     Breath sounds: Normal breath sounds.  Musculoskeletal: Normal range of motion.  Lymphadenopathy:     Cervical: Cervical adenopathy present.  Skin:    General: Skin is warm and dry.     Findings: No rash.  Neurological:     Mental Status: She is alert.  Psychiatric:        Mood and Affect: Mood normal.      UC Treatments / Results  Labs (all labs ordered are listed, but only abnormal results are displayed) Labs Reviewed - No data to display  EKG None  Radiology No results found.  Procedures Procedures (including critical care time)  Medications Ordered in UC Medications - No data to display  Initial Impression / Assessment and Plan / UC Course  I have reviewed the triage vital signs and the nursing notes.  Pertinent labs & imaging results that were available during my care of the patient were reviewed by me and considered in my medical decision making (see chart for details).     Symptoms consistent with lymphadenopathy She has no other concerning signs or symptoms Instructed to monitor the lymphadenopathy and return for any continued or worsening symptoms Final Clinical Impressions(s) / UC Diagnoses   Final diagnoses:  Lymphadenopathy of right cervical region     Discharge Instructions     I believe that this is a swollen lymph node If you start developing any worsening symptoms to include fever, sore throat, ear pain, worsening swelling please follow-up You can take ibuprofen for the pain and inflammation.     ED Prescriptions    None     Controlled Substance Prescriptions [place] Controlled Substance Registry consulted? Not Applicable          Janace Aris, NP 06/06/18 1036

## 2018-08-26 ENCOUNTER — Telehealth: Payer: Self-pay

## 2018-08-26 NOTE — Telephone Encounter (Signed)
Unable to get in contact with the patient to convert their office visit with Amy on 09/01/2018 into a doxy.me visit. I left a voicemail asking the patient to return my call. Office number was provided.   If patient calls back please convert their office visit into a doxy.me visit.   

## 2018-08-29 ENCOUNTER — Other Ambulatory Visit: Payer: Self-pay | Admitting: Adult Health

## 2018-09-01 ENCOUNTER — Ambulatory Visit (INDEPENDENT_AMBULATORY_CARE_PROVIDER_SITE_OTHER): Payer: BLUE CROSS/BLUE SHIELD | Admitting: Family Medicine

## 2018-09-01 ENCOUNTER — Ambulatory Visit: Payer: Medicaid Other | Admitting: Adult Health

## 2018-09-01 ENCOUNTER — Encounter: Payer: Self-pay | Admitting: Family Medicine

## 2018-09-01 ENCOUNTER — Other Ambulatory Visit: Payer: Self-pay

## 2018-09-01 DIAGNOSIS — R519 Headache, unspecified: Secondary | ICD-10-CM

## 2018-09-01 DIAGNOSIS — R51 Headache: Secondary | ICD-10-CM

## 2018-09-01 MED ORDER — RIZATRIPTAN BENZOATE 10 MG PO TBDP: 10.0000 mg | ORAL_TABLET | ORAL | 11 refills | Status: DC | PRN

## 2018-09-01 MED ORDER — ERENUMAB-AOOE 140 MG/ML ~~LOC~~ SOAJ: 140.0000 mg | SUBCUTANEOUS | 3 refills | Status: DC

## 2018-09-01 NOTE — Progress Notes (Signed)
PATIENT: Tracy Mcconnell DOB: Jun 18, 1995  REASON FOR VISIT: follow up HISTORY FROM: patient  Virtual Visit via Telephone Note  I connected with Tracy Mcconnell on 09/01/18 at 11:00 AM EDT by telephone and verified that I am speaking with the correct person using two identifiers.   I discussed the limitations, risks, security and privacy concerns of performing an evaluation and management service by telephone and the availability of in person appointments. I also discussed with the patient that there may be a patient responsible charge related to this service. The patient expressed understanding and agreed to proceed.   History of Present Illness:  09/01/18 Tracy Mcconnell is a 23 y.o. female for follow up of migraines.  She reports that after starting a Ajovy her migraine days did reduced to about 14-15 a month.  Previously she was having daily migraines.  She has been on the Ajovy for at least 6 months.  She is hoping for a different medication to help decrease frequency even more.  She has tried and failed Topamax, amitriptyline and Imitrex in the past.   HISTORY (copied from [place] note on 12/16/2017)  [name] is a 23 year old female with a history of migraine.  She returns today for follow-up.  She remains on gabapentin 300 mg 3 times a day as well as a Ajovy.  She states that she has been taking Ajovy for 5 to 6 months.  She reports that her headaches have significantly improved.  She reports that she has 5 migraine a month.  Her headaches still occur behind the eyes and typically starts on the right and then moves to the left.  She does have photophobia and phonophobia as well as nausea and vomiting.  She states that when she typically gets a headache she will use an ice pack to go to sleep or she may use ibuprofen.  She reports that her headache tends to resolve in 2 to 3 hours.  She reports that starting about 2 weeks ago she began having dizziness.   She states it occurs when she looks to the right or the left.  She did go to urgent care and they diagnosed her  with vertigo and gave her Antivert.  She states that has been helping but she continues to have dizziness.  She states that it occurs on a daily basis.  She also reports that starting 2 days ago she began to have numbness in the toes.  This is limited to the toes.  The patient's report of numbness and dizziness does not coincide with her migraine headaches.  She returns today for evaluation.[name]  HISTORY 06/06/17 [name] a 23 year old female with a history of migraine headaches. She returns today for follow-up. She was placed on gabapentin 300 mg 3 times a day at the last visit. She reports that ithas not offered her any benefit with her headaches. She continues to have approximately 4 migraines a month reports that can last almost a full week sometimes. She states that she only has 10 headache free days out of the month. She states that the headaches are behind her eyes and spread all over her head. She does have photophobia, phonophobia and nausea. Denies any vomiting. She denies any numbness or tingling in extremities. In the past she has tried Imitrex, Topamax and amitriptyline but was unable to tolerate these medications. She returns today for evaluation.  HISTORYCaitlyn Tracy Mcconnell a 23 y.o.femalehere as a referral from Dr. Franchot Mimes migraines, anxiety. She has a FHx  of migraines in mother. She has had migraines since the 5. Her migraines are unilateral, move to the other side, behind the eyes, squeezing, pulsating, bad nausea but no vomiting, she has light and sound sensitivity, she has to sit perfectly still because movement exacerbates it. She has headaches every day, she wakes up with them. If she spends too much time in the sunlight she can get a migraine, stress. She has had daily headaches in the last year, can last all day long some migraines last a  week, every headache is migrainous. No medication overuse. Rarely she has an aura, she can have a cloud sometimes on the left but not always. Not sleeping at night, there is a lot of stress. Laying down in the dark helps. Grandmother with brain tumor, unknown kind.  Reviewed notes, labs and imaging from outside physicians, which showed:   Medications tried for migraine: Sertraline, topiramate(for a year, 100mg ), amitriptyline(for a year), Imitrex(bad reaction), clonidine.  Patient has 2-4 migraines per month. Failure or intolerance to Imitrex, Topamax and amitriptyline. She has a history of migraine headache with and without aura, currently having several headaches per month, has been unable to tolerate multiple medications, no status migrainosus.  Reviewed emergency room notes she has been in the emergency roomm ultiple times in the last year for anxiety presenting as chest pain and dizziness, chest x-ray was normal, heart racing, chest pain, sharp in the middle of her chest, lightheaded but no syncopal episode.  Observations/Objective:  Generalized: Well developed, in no acute distress  Mentation: Alert oriented to time, place, history taking. Follows all commands speech and language fluent   Assessment and Plan:  23 y.o. year old female  has a past medical history of Migraines. here with    ICD-10-CM   1. Chronic daily headache R51 Erenumab-aooe 140 MG/ML SOAJ   Unfortunately, she continues to have migraines on a regular basis.  She reports at least 14-15 migraines per month.  This is significantly reduced from her previous daily migraine.  We have discussed several options including starting Botox, adding a preventative medication or switching CGRP's.  She wishes to try Aimovig at this time.  I have called in this medication for her.  We will also try rizatriptan for abortive therapy.  She reported a feeling that her whole body felt heavy with Imitrex.  She was advised to call  immediately should any adverse effects occur.  She was advised to avoid over-the-counter analgesics if possible.  We have discussed rebound headaches.  I would like to follow-up with her in about 3 months to assess response to Aimovig.  She verbalizes understanding and agreement with this plan.  No orders of the defined types were placed in this encounter.   Meds ordered this encounter  Medications   Erenumab-aooe 140 MG/ML SOAJ    Sig: Inject 140 mg into the skin every 30 (thirty) days.    Dispense:  3 pen    Refill:  3    Order Specific Question:   Supervising Provider    Answer:   Anson FretHERN, ANTONIA B [id number]]   rizatriptan (MAXALT-MLT) 10 MG disintegrating tablet    Sig: Take 1 tablet (10 mg total) by mouth as needed for migraine. May repeat in 2 hours if needed    Dispense:  9 tablet    Refill:  11    Order Specific Question:   Supervising Provider    Answer:   Anson FretAHERN, ANTONIA B [[id number]]     Follow Up  Instructions:  I discussed the assessment and treatment plan with the patient. The patient was provided an opportunity to ask questions and all were answered. The patient agreed with the plan and demonstrated an understanding of the instructions.   The patient was advised to call back or seek an in-person evaluation if the symptoms worsen or if the condition fails to improve as anticipated.  I provided 20 minutes of non-face-to-face time during this encounter.  Patient is located at her place of residence during video conference.  Provider is located at her place of residence.  Rozell Searing, CMA helped to facilitate visit.   Shawnie Dapper, NP

## 2018-09-01 NOTE — Telephone Encounter (Signed)
Spoke with the patient and she stated that she has received a link from Amy to do her doxy.me visit.

## 2018-09-01 NOTE — Progress Notes (Signed)
Made any corrections needed, and agree with history, physical, neuro exam,assessment and plan as stated above.     Gertie Broerman, MD [facility] 

## 2018-09-03 ENCOUNTER — Telehealth: Payer: Self-pay | Admitting: *Deleted

## 2018-09-03 NOTE — Telephone Encounter (Signed)
CMM KEY#[id number] for aimovig 140mg /ml initiated.  Determination to be made within 24-72 hours.

## 2018-09-04 NOTE — Telephone Encounter (Signed)
CMM approval for aimovig 09-03-18 thru 12-01-18.  Fax confirmation to [facility].

## 2018-09-09 DIAGNOSIS — F411 Generalized anxiety disorder: Secondary | ICD-10-CM | POA: Diagnosis not present

## 2018-09-09 DIAGNOSIS — F3340 Major depressive disorder, recurrent, in remission, unspecified: Secondary | ICD-10-CM | POA: Diagnosis not present

## 2018-09-16 DIAGNOSIS — F339 Major depressive disorder, recurrent, unspecified: Secondary | ICD-10-CM | POA: Diagnosis not present

## 2018-09-16 DIAGNOSIS — F411 Generalized anxiety disorder: Secondary | ICD-10-CM | POA: Diagnosis not present

## 2018-09-16 DIAGNOSIS — F4010 Social phobia, unspecified: Secondary | ICD-10-CM | POA: Diagnosis not present

## 2018-10-13 ENCOUNTER — Other Ambulatory Visit: Payer: Self-pay | Admitting: Neurology

## 2018-10-19 ENCOUNTER — Encounter (HOSPITAL_COMMUNITY): Payer: Self-pay

## 2018-10-19 ENCOUNTER — Other Ambulatory Visit: Payer: Self-pay

## 2018-10-19 ENCOUNTER — Emergency Department (HOSPITAL_COMMUNITY)
Admission: EM | Admit: 2018-10-19 | Discharge: 2018-10-19 | Disposition: A | Discharge status: Home / Self Care | Payer: BLUE CROSS/BLUE SHIELD | Attending: Emergency Medicine | Admitting: Emergency Medicine

## 2018-10-19 DIAGNOSIS — Z888 Allergy status to other drugs, medicaments and biological substances status: Secondary | ICD-10-CM | POA: Insufficient documentation

## 2018-10-19 DIAGNOSIS — Z79899 Other long term (current) drug therapy: Secondary | ICD-10-CM | POA: Diagnosis not present

## 2018-10-19 DIAGNOSIS — R51 Headache: Secondary | ICD-10-CM | POA: Diagnosis not present

## 2018-10-19 DIAGNOSIS — G43009 Migraine without aura, not intractable, without status migrainosus: Secondary | ICD-10-CM | POA: Diagnosis not present

## 2018-10-19 MED ORDER — DEXAMETHASONE SODIUM PHOSPHATE 10 MG/ML IJ SOLN
10.0000 mg | Freq: Once | INTRAMUSCULAR | Status: AC
  Administered 2018-10-19: 10 mg via INTRAVENOUS
  Filled 2018-10-19: qty 1

## 2018-10-19 MED ORDER — DIPHENHYDRAMINE HCL 50 MG/ML IJ SOLN
25.0000 mg | Freq: Once | INTRAMUSCULAR | Status: AC
  Administered 2018-10-19: 25 mg via INTRAVENOUS
  Filled 2018-10-19: qty 1

## 2018-10-19 MED ORDER — SODIUM CHLORIDE 0.9 % IV BOLUS
1000.0000 mL | Freq: Once | INTRAVENOUS | Status: AC
  Administered 2018-10-19: 1000 mL via INTRAVENOUS

## 2018-10-19 MED ORDER — KETOROLAC TROMETHAMINE 30 MG/ML IJ SOLN
30.0000 mg | Freq: Once | INTRAMUSCULAR | Status: AC
  Administered 2018-10-19: 30 mg via INTRAVENOUS
  Filled 2018-10-19: qty 1

## 2018-10-19 MED ORDER — METOCLOPRAMIDE HCL 5 MG/ML IJ SOLN
10.0000 mg | Freq: Once | INTRAMUSCULAR | Status: AC
  Administered 2018-10-19: 04:00:00 10 mg via INTRAVENOUS
  Filled 2018-10-19: qty 2

## 2018-10-19 NOTE — ED Provider Notes (Signed)
[facility] DEPT Provider Note   CSN: [id number] Arrival date & time: 10/19/18  0205    History   Chief Complaint Chief Complaint  Patient presents with  . Headache    HPI Tracy Mcconnell is a 23 y.o. female.   The history is provided by the patient.  Headache She has history of migraines and comes in with a left-sided headache which started this afternoon.  For the last week, she has been having headaches which come on suddenly and she states it is a severe pressure.  And feels like she has gone up to a high altitude.  There is some blurring of vision, nausea, vomiting.  There is no change in her baseline diplopia.  She feels generally weak when these headaches come on, but no focal weakness.  There is associated photophobia and phonophobia.  She denies fever, chills, sweats.  She denies stiff neck.  She denies exposure to COVID-19.  Past Medical History:  Diagnosis Date  . Migraines     Patient Active Problem List   Diagnosis Date Noted  . Chronic daily headache 02/04/2017    Past Surgical History:  Procedure Laterality Date  . NO PAST SURGERIES       OB History   No obstetric history on file.      Home Medications    Prior to Admission medications   Medication Sig Start Date End Date Taking? Authorizing Provider  acetaminophen (TYLENOL) 500 MG tablet Take 500 mg by mouth every 6 (six) hours as needed (pain).    [provider]  buPROPion (WELLBUTRIN XL) 150 MG 24 hr tablet  05/09/17   [provider]  busPIRone (BUSPAR) 10 MG tablet  05/09/17   [provider]  cloNIDine (CATAPRES) 0.1 MG tablet Take 0.1-0.2 mg at bedtime as needed by mouth.    [provider]  Erenumab-aooe 140 MG/ML SOAJ Inject 140 mg into the skin every 30 (thirty) days. 09/01/18   Lomax, Amy, NP  escitalopram (LEXAPRO) 20 MG tablet Take 20 mg by mouth at bedtime.    [provider]  gabapentin (NEURONTIN) 300 MG  capsule TAKE 1 CAPSULE BY MOUTH THREE TIMES A DAY 10/13/18   Lomax, Amy, NP  ibuprofen (ADVIL,MOTRIN) 400 MG tablet Take 1 tablet (400 mg total) by mouth every 6 (six) hours as needed. 02/27/16   Domenic Moras, PA-C  meclizine (ANTIVERT) 25 MG tablet Take 1 tablet (25 mg total) by mouth 3 (three) times daily as needed for dizziness. 12/21/17   Drenda Freeze, MD  rizatriptan (MAXALT-MLT) 10 MG disintegrating tablet Take 1 tablet (10 mg total) by mouth as needed for migraine. May repeat in 2 hours if needed 09/01/18   Debbora Presto, NP    Family History Family History  Problem Relation Age of Onset  . Migraines Mother   . Thyroid disease Mother   . Diabetes Paternal Grandfather     Social History Social History   Tobacco Use  . Smoking status: Never Smoker  . Smokeless tobacco: Never Used  Substance Use Topics  . Alcohol use: No  . Drug use: No     Allergies   Imitrex [sumatriptan]   Review of Systems Review of Systems  Neurological: Positive for headaches.  All other systems reviewed and are negative.    Physical Exam Updated Vital Signs BP 115/87 (BP Location: Right Arm)   Pulse 79   Temp 98.5 F (36.9 C) (Oral)   Resp 18   Ht  5\' 1"  (1.549 m)   Wt 56.7 kg   SpO2 97%   BMI 23.62 kg/m   Physical Exam Vitals signs and nursing note reviewed.    23 year old female, resting comfortably and in no acute distress. Vital signs are normal. Oxygen saturation is 97%, which is normal. Head is normocephalic and atraumatic. PERRLA, EOMI. Oropharynx is clear.  Fundi show no hemorrhage, exudate, papilledema.   Neck is nontender and supple without adenopathy or JVD. Back is nontender and there is no CVA tenderness. Lungs are clear without rales, wheezes, or rhonchi. Chest is nontender. Heart has regular rate and rhythm without murmur. Abdomen is soft, flat, nontender without masses or hepatosplenomegaly and peristalsis is normoactive. Extremities have no cyanosis or edema, full  range of motion is present. Skin is warm and dry without rash. Neurologic: Mental status is normal, cranial nerves are intact, there are no motor or sensory deficits.  ED Treatments / Results   Procedures Procedures  Medications Ordered in ED Medications  sodium chloride 0.9 % bolus 1,000 mL (has no administration in time range)  metoCLOPramide (REGLAN) injection 10 mg (has no administration in time range)  diphenhydrAMINE (BENADRYL) injection 25 mg (has no administration in time range)  dexamethasone (DECADRON) injection 10 mg (has no administration in time range)     Initial Impression / Assessment and Plan / ED Course  I have reviewed the triage vital signs and the nursing notes.  Migraine headache.  No red flags to suggest more serious causes of headache such as subarachnoid hemorrhage, meningitis, tumor.  Old records are reviewed confirming outpatient management of migraine headaches, 1 prior ED visit for headache.  She is given a migraine cocktail of normal saline, metoclopramide, diphenhydramine, dexamethasone.  4:56 AM Headache is improved substantially, but patient does not feel it is doing well enough to go home.  She is given a dose of ketorolac.  5:38 AM Headache is much improved.  She feels comfortable going home.  She is discharged with instructions to follow-up with her neurologist.  Final Clinical Impressions(s) / ED Diagnoses   Final diagnoses:  Migraine without aura and without status migrainosus, not intractable    ED Discharge Orders    None       Dione Booze, MD 10/19/18 [phone]

## 2018-10-19 NOTE — ED Triage Notes (Signed)
Pt reports a sudden migraine that started around 4p. She reports a hx of migraines, but states that they are usually start slow and build up. States that she took some of her medication and feels a little better, but the pain is still there. Endorses photosensitivity. Pain is at the base of her skull and behind her eyes.

## 2018-10-25 ENCOUNTER — Telehealth: Payer: BLUE CROSS/BLUE SHIELD | Admitting: Physician Assistant

## 2018-10-25 DIAGNOSIS — R200 Anesthesia of skin: Secondary | ICD-10-CM

## 2018-10-25 DIAGNOSIS — M549 Dorsalgia, unspecified: Secondary | ICD-10-CM

## 2018-10-25 NOTE — Progress Notes (Signed)
Based on what you shared with me, I feel your condition warrants further evaluation and I recommend that you be seen for a face to face office visit.  Due to the tingling and numbness in the arm along with the pain, you need to be seen to have a good examination and to make sure nothing more concerning is going on. This is above what we are allowed to treat via e-visit.  NOTE: If you entered your credit card information for this eVisit, you will not be charged. You may see a "hold" on your card for the $35 but that hold will drop off and you will not have a charge processed.  If you are having a true medical emergency please call 911.     For an urgent face to face visit, [facility] has five urgent care centers for your convenience:    [email or web address] to reserve your spot online an avoid wait times  [facility] (New Address!) [address] [place], [place] [address] *Just off [address], across the road from [place] hours of operation: Monday-Friday, 12 PM to 6 PM  Closed Saturday & Sunday   The following sites will take your insurance:  . [facility]    [id number]                  Get Driving Directions  [address], [place] [address] . 10 am to 8 pm Monday-Friday . 12 pm to 8 pm Saturday-Sunday   . [facility] [place]                  Get Driving Directions  [address], [place], [place] [address] . 8 am to 8 pm Monday-Friday . 9 am to 6 pm Saturday . 11 am to 6 pm Sunday   . [facility] at [place]                  Get Driving Directions   [address] [place], [place] [address] . 8 am to 8 pm Monday-Friday . 8 am to 4 pm Saturday-Sunday    . [facility] [place]                    Get Driving Directions  [phone]  [address]  [place], [place] [address]  . Monday-Friday, 12 PM to 6 PM    Your e-visit answers were reviewed by a board certified advanced clinical practitioner to complete your personal care plan.  Thank you for using e-Visits.

## 2018-10-26 ENCOUNTER — Ambulatory Visit (HOSPITAL_COMMUNITY)
Admission: EM | Admit: 2018-10-26 | Discharge: 2018-10-26 | Disposition: A | Discharge status: Home / Self Care | Payer: BLUE CROSS/BLUE SHIELD | Attending: Physician Assistant | Admitting: Physician Assistant

## 2018-10-26 ENCOUNTER — Encounter (HOSPITAL_COMMUNITY): Payer: Self-pay | Admitting: Physician Assistant

## 2018-10-26 ENCOUNTER — Other Ambulatory Visit: Payer: Self-pay

## 2018-10-26 DIAGNOSIS — R200 Anesthesia of skin: Secondary | ICD-10-CM

## 2018-10-26 DIAGNOSIS — R202 Paresthesia of skin: Secondary | ICD-10-CM

## 2018-10-26 DIAGNOSIS — M545 Low back pain, unspecified: Secondary | ICD-10-CM

## 2018-10-26 MED ORDER — PREDNISONE 50 MG PO TABS: 50.0000 mg | ORAL_TABLET | Freq: Every day | ORAL | 0 refills | Status: DC

## 2018-10-26 MED ORDER — METHOCARBAMOL 500 MG PO TABS: 500.0000 mg | ORAL_TABLET | Freq: Two times a day (BID) | ORAL | 0 refills | Status: DC

## 2018-10-26 NOTE — ED Provider Notes (Signed)
[facility]    CSN: [id number] Arrival date & time: 10/26/18  1355     History   Chief Complaint Chief Complaint  Patient presents with  . Back Pain    HPI Tracy Mcconnell is a 23 y.o. female.   23 year old female comes in for 3-day history of back pain.  States there is constant dull pain to the bilateral back, and movement, laying down, standing, will make the pain worse and creates a stabbing pain.  Denies radiation of pain.  Denies injury/trauma.  Also complains of left hand numbness/tightness.  Denies neck pain, injury to the neck, shoulder, elbow, hands.  Denies spreading erythema, warmth, fever.  Denies loss of bladder or bowel control, saddle anesthesia.  Denies urinary symptoms such as frequency, dysuria, hematuria.  Take Advil 400 mg every 4 to 6 hours without any relief.     Past Medical History:  Diagnosis Date  . Migraines     Patient Active Problem List   Diagnosis Date Noted  . Chronic daily headache 02/04/2017    Past Surgical History:  Procedure Laterality Date  . NO PAST SURGERIES      OB History   No obstetric history on file.      Home Medications    Prior to Admission medications   Medication Sig Start Date End Date Taking? Authorizing Provider  acetaminophen (TYLENOL) 500 MG tablet Take 500 mg by mouth every 6 (six) hours as needed (pain).    [provider]  buPROPion (WELLBUTRIN XL) 150 MG 24 hr tablet  05/09/17   [provider]  busPIRone (BUSPAR) 10 MG tablet  05/09/17   [provider]  cloNIDine (CATAPRES) 0.1 MG tablet Take 0.1-0.2 mg at bedtime as needed by mouth.    [provider]  Erenumab-aooe 140 MG/ML SOAJ Inject 140 mg into the skin every 30 (thirty) days. 09/01/18   Lomax, Bergen Melle, NP  escitalopram (LEXAPRO) 20 MG tablet Take 20 mg by mouth at bedtime.    [provider]  gabapentin (NEURONTIN) 300 MG capsule TAKE 1 CAPSULE BY MOUTH THREE TIMES A DAY 10/13/18   Lomax, Koury Roddy,  NP  ibuprofen (ADVIL,MOTRIN) 400 MG tablet Take 1 tablet (400 mg total) by mouth every 6 (six) hours as needed. 02/27/16   Fayrene Helper, PA-C  meclizine (ANTIVERT) 25 MG tablet Take 1 tablet (25 mg total) by mouth 3 (three) times daily as needed for dizziness. 12/21/17   Charlynne Pander, MD  methocarbamol (ROBAXIN) 500 MG tablet Take 1 tablet (500 mg total) by mouth 2 (two) times daily. 10/26/18   Cathie Hoops, Londynn Sonoda V, PA-C  predniSONE (DELTASONE) 50 MG tablet Take 1 tablet (50 mg total) by mouth daily. 10/26/18   Cathie Hoops, Sandy Haye V, PA-C  rizatriptan (MAXALT-MLT) 10 MG disintegrating tablet Take 1 tablet (10 mg total) by mouth as needed for migraine. May repeat in 2 hours if needed 09/01/18   Shawnie Dapper, NP    Family History Family History  Problem Relation Age of Onset  . Migraines Mother   . Thyroid disease Mother   . Diabetes Paternal Grandfather     Social History Social History   Tobacco Use  . Smoking status: Never Smoker  . Smokeless tobacco: Never Used  Substance Use Topics  . Alcohol use: No  . Drug use: No     Allergies   Imitrex [sumatriptan]   Review of Systems Review of Systems  Reason unable to perform ROS: See HPI as above.  Physical Exam Triage Vital Signs ED Triage Vitals  Enc Vitals Group     BP 10/26/18 1507 123/65     Pulse Rate 10/26/18 1507 94     Resp 10/26/18 1507 16     Temp 10/26/18 1507 (!) 97.1 F (36.2 C)     Temp Source 10/26/18 1507 Tympanic     SpO2 10/26/18 1507 100 %     Weight --      Height --      Head Circumference --      Peak Flow --      Pain Score 10/26/18 1506 7     Pain Loc --      Pain Edu? --      Excl. in [place]? --    No data found.  Updated Vital Signs BP 123/65 (BP Location: Right Arm)   Pulse 94   Temp (!) 97.1 F (36.2 C) (Tympanic)   Resp 16   SpO2 100%   Physical Exam Constitutional:      General: She is not in acute distress.    Appearance: She is well-developed. She is not diaphoretic.  HENT:     Head:  Normocephalic and atraumatic.  Eyes:     Conjunctiva/sclera: Conjunctivae normal.     Pupils: Pupils are equal, round, and reactive to light.  Cardiovascular:     Rate and Rhythm: Normal rate and regular rhythm.     Heart sounds: Normal heart sounds. No murmur. No friction rub. No gallop.   Pulmonary:     Effort: Pulmonary effort is normal. No accessory muscle usage or respiratory distress.     Breath sounds: Normal breath sounds. No stridor. No decreased breath sounds, wheezing, rhonchi or rales.  Musculoskeletal:     Comments: No tenderness on palpation of the spinous processes.  Tenderness to palpation of bilateral lumbar region.  Full range of motion of back and hips without obvious pain.  Strength normal and equal bilaterally. Sensation intact and equal bilaterally.  Negative straight leg raise.  No tenderness to palpation of the neck.  Full range of motion of neck, shoulder, elbow, fingers.  Strength normal and equal bilaterally.  Sensation 3 out of 5 diffusely to the left hand/fingers.  Radial pulse 2+, cap refill less than 2 seconds.  Skin:    General: Skin is warm and dry.  Neurological:     Mental Status: She is alert and oriented to person, place, and time.      UC Treatments / Results  Labs (all labs ordered are listed, but only abnormal results are displayed) Labs Reviewed - No data to display  EKG   Radiology No results found.  Procedures Procedures (including critical care time)  Medications Ordered in UC Medications - No data to display  Initial Impression / Assessment and Plan / UC Course  I have reviewed the triage vital signs and the nursing notes.  Pertinent labs & imaging results that were available during my care of the patient were reviewed by me and considered in my medical decision making (see chart for details).    Will start patient on prednisone.  Robaxin as needed for symptoms.  If diffuse numbness/tingling does not resolve, to follow-up with  orthopedics for further evaluation and management needed.  Final Clinical Impressions(s) / UC Diagnoses   Final diagnoses:  Acute left-sided low back pain without sciatica  Numbness and tingling in left hand   ED Prescriptions    Medication Sig Dispense Auth. Provider  predniSONE (DELTASONE) 50 MG tablet Take 1 tablet (50 mg total) by mouth daily. 5 tablet Feige Lowdermilk V, PA-C   methocarbamol (ROBAXIN) 500 MG tablet Take 1 tablet (500 mg total) by mouth 2 (two) times daily. 20 tablet Threasa AlphaYu, Verona Hartshorn V, PA-C        Briggs Edelen V, [place] JerseyPA-C 10/26/18 1821

## 2018-10-26 NOTE — Discharge Instructions (Signed)
Start prednisone as directed. Robaxin as needed, this can make you drowsy, so do not take if you are going to drive, operate heavy machinery, or make important decisions. Ice/heat compresses as needed. Follow up with PCP/orthopedics if continues with numbness/tingling for further evaluation needed. If experience numbness/tingling of the inner thighs, loss of bladder or bowel control, go to the emergency department for evaluation.

## 2018-10-26 NOTE — ED Triage Notes (Signed)
Per pt she has hx of back pain but for about 3 das now it has been hurting more in the lower and moving upwards in the middle. Movement and lying is even painful. No injury

## 2018-10-27 DIAGNOSIS — M545 Low back pain: Secondary | ICD-10-CM | POA: Diagnosis not present

## 2018-10-28 ENCOUNTER — Encounter: Payer: Self-pay | Admitting: Family Medicine

## 2018-10-28 ENCOUNTER — Ambulatory Visit: Payer: BLUE CROSS/BLUE SHIELD | Admitting: Family Medicine

## 2018-10-28 ENCOUNTER — Other Ambulatory Visit: Payer: Self-pay

## 2018-10-28 ENCOUNTER — Telehealth: Payer: Self-pay | Admitting: Family Medicine

## 2018-10-28 DIAGNOSIS — M5416 Radiculopathy, lumbar region: Secondary | ICD-10-CM | POA: Insufficient documentation

## 2018-10-28 DIAGNOSIS — G43709 Chronic migraine without aura, not intractable, without status migrainosus: Secondary | ICD-10-CM

## 2018-10-28 DIAGNOSIS — G43909 Migraine, unspecified, not intractable, without status migrainosus: Secondary | ICD-10-CM | POA: Insufficient documentation

## 2018-10-28 NOTE — Patient Instructions (Signed)
Continue gabapentin and Amovig as prescribed  Stay well hydrated and eat well balanced diet  Try magnesium OTC  We will work on Biochemist, clinical for Botox therapy  Follow up in 3 months   Magnesium Salicylate tablets What is this medicine? MAGNESIUM SALICYLATE (mag NEE zhum sa LI si late) is a pain reliever. It is used to treat mild to moderate pain. It is also used to treat the pain and swelling of arthritis. This medicine may be used for other purposes; ask your health care provider or pharmacist if you have questions. COMMON BRAND NAME(S): Doans, Masalate, MST 600, Novasal, Percogesic Backache Relief What should I tell my health care provider before I take this medicine? They need to know if you have any of these conditions:  bleeding problems  gout  heart disease  if you frequently drink alcohol containing drinks  kidney disease  liver disease  low level of vitamin K in blood  smoke tobacco  stomach ulcers or other problems  an unusual or allergic reaction to magnesium salicylate, aspirin, other medicines, dyes, or preservatives  pregnant or trying to get pregnant  breast-feeding How should I use this medicine? Take this medicine by mouth with a glass of water. Follow the directions on the package or prescription label. You can take this medicine with or without food. If it upsets your stomach, take it with food. Take your medicine at regular intervals. Do not take it more often than directed. Talk to your pediatrician regarding the use of this medicine in children. Special care may be needed. Patients over 10 years old may have a stronger reaction and need a smaller dose. Overdosage: If you think you have taken too much of this medicine contact a poison control center or emergency room at once. NOTE: This medicine is only for you. Do not share this medicine with others. What if I miss a dose? If you miss a dose, take it as soon as you can. If it is almost time  for your next dose, take only that dose. Do not take double or extra doses. What may interact with this medicine? Do not take this medicine with any of the following medications:  methotrexate  probenecid This medicine may also interact with the following medications:  acetazolamide  alcohol  antibiotics like ciprofloxacin, levofloxacin, ofloxacin, tetracycline  aspirin and aspirin-like medicines  bismuth subsalicylate  cidofovir  flavocoxid  herbal supplements like feverfew, garlic, ginger, ginkgo biloba, horse chestnut  medicines for arthritis  medicines for diabetes  medicines for gout  medicines that treat or prevent blood clots like enoxaparin, heparin, ticlopidine, warfarin  methazolamide  sulfinpyrazone  varicella live vaccine This list may not describe all possible interactions. Give your health care provider a list of all the medicines, herbs, non-prescription drugs, or dietary supplements you use. Also tell them if you smoke, drink alcohol, or use illegal drugs. Some items may interact with your medicine. What should I watch for while using this medicine? Tell your doctor or health care professional if the pain lasts more than 10 days, if it gets worse, or if there is a new or different kind of pain. Tell your doctor if you see redness or swelling. Also, check with your doctor if a fever that lasts for more than 3 days. Do not take aspirin or aspirin-like medicines with this medicine. Too much aspirin can be dangerous. Always read the labels carefully. This medicine can irritate your stomach or cause bleeding problems. Do not smoke cigarettes  or drink alcohol while taking this medicine. Do not lie down for 30 minutes after taking this medicine to prevent irritation to your throat. If you are scheduled for any medical or dental procedure, tell your healthcare provider that you are taking this medicine. You may need to stop taking this medicine before the procedure.  What side effects may I notice from receiving this medicine? Side effects that you should report to your doctor or health care professional as soon as possible:  allergic reactions like skin rash, itching or hives, swelling of the face, lips, or tongue  black, tarry stools  bloody, coffee ground-like vomit  breathing problems  changes in hearing or ringing in the ears  confusion  pain on swallowing  trouble passing urine or change in the amount of urine  unusual bleeding or bruising  unusually weak or tired Side effects that usually do not require medical attention (report to your doctor or health care professional if they continue or are bothersome):  diarrhea or constipation  nausea, vomiting  stomach gas or heartburn This list may not describe all possible side effects. Call your doctor for medical advice about side effects. You may report side effects to FDA at 1-800-FDA-1088. Where should I keep my medicine? Keep out of the reach of children. Store at room temperature between 15 and 30 degrees C (59 and 86 degrees F). Protect from heat and moisture. Throw away any unused medicine after the expiration date. NOTE: This sheet is a summary. It may not cover all possible information. If you have questions about this medicine, talk to your doctor, pharmacist, or health care provider.  2020 Elsevier/Gold Standard (2007-09-25 10:29:27) Migraine Headache A migraine headache is a very strong throbbing pain on one side or both sides of your head. This type of headache can also cause other symptoms. It can last from 4 hours to 3 days. Talk with your doctor about what things may bring on (trigger) this condition. What are the causes? The exact cause of this condition is not known. This condition may be triggered or caused by:  Drinking alcohol.  Smoking.  Taking medicines, such as: ? Medicine used to treat chest pain (nitroglycerin). ? Birth control pills. ? Estrogen. ? Some  blood pressure medicines.  Eating or drinking certain products.  Doing physical activity. Other things that may trigger a migraine headache include:  Having a menstrual period.  Pregnancy.  Hunger.  Stress.  Not getting enough sleep or getting too much sleep.  Weather changes.  Tiredness (fatigue). What increases the risk?  Being 58-27 years old.  Being female.  Having a family history of migraine headaches.  Being Caucasian.  Having depression or anxiety.  Being very overweight. What are the signs or symptoms?  A throbbing pain. This pain may: ? Happen in any area of the head, such as on one side or both sides. ? Make it hard to do daily activities. ? Get worse with physical activity. ? Get worse around bright lights or loud noises.  Other symptoms may include: ? Feeling sick to your stomach (nauseous). ? Vomiting. ? Dizziness. ? Being sensitive to bright lights, loud noises, or smells.  Before you get a migraine headache, you may get warning signs (an aura). An aura may include: ? Seeing flashing lights or having blind spots. ? Seeing bright spots, halos, or zigzag lines. ? Having tunnel vision or blurred vision. ? Having numbness or a tingling feeling. ? Having trouble talking. ? Having weak muscles.  Some people have symptoms after a migraine headache (postdromal phase), such as: ? Tiredness. ? Trouble thinking (concentrating). How is this treated?  Taking medicines that: ? Relieve pain. ? Relieve the feeling of being sick to your stomach. ? Prevent migraine headaches.  Treatment may also include: ? Having acupuncture. ? Avoiding foods that bring on migraine headaches. ? Learning ways to control your body functions (biofeedback). ? Therapy to help you know and deal with negative thoughts (cognitive behavioral therapy). Follow these instructions at home: Medicines  Take over-the-counter and prescription medicines only as told by your doctor.   Ask your doctor if the medicine prescribed to you: ? Requires you to avoid driving or using heavy machinery. ? Can cause trouble pooping (constipation). You may need to take these steps to prevent or treat trouble pooping:  Drink enough fluid to keep your pee (urine) pale yellow.  Take over-the-counter or prescription medicines.  Eat foods that are high in fiber. These include beans, whole grains, and fresh fruits and vegetables.  Limit foods that are high in fat and sugar. These include fried or sweet foods. Lifestyle  Do not drink alcohol.  Do not use any products that contain nicotine or tobacco, such as cigarettes, e-cigarettes, and chewing tobacco. If you need help quitting, ask your doctor.  Get at least 8 hours of sleep every night.  Limit and deal with stress. General instructions      Keep a journal to find out what may bring on your migraine headaches. For example, write down: ? What you eat and drink. ? How much sleep you get. ? Any change in what you eat or drink. ? Any change in your medicines.  If you have a migraine headache: ? Avoid things that make your symptoms worse, such as bright lights. ? It may help to lie down in a dark, quiet room. ? Do not drive or use heavy machinery. ? Ask your doctor what activities are safe for you.  Keep all follow-up visits as told by your doctor. This is important. Contact a doctor if:  You get a migraine headache that is different or worse than others you have had.  You have more than 15 headache days in one month. Get help right away if:  Your migraine headache gets very bad.  Your migraine headache lasts longer than 72 hours.  You have a fever.  You have a stiff neck.  You have trouble seeing.  Your muscles feel weak or like you cannot control them.  You start to lose your balance a lot.  You start to have trouble walking.  You pass out (faint).  You have a seizure. Summary  A migraine headache  is a very strong throbbing pain on one side or both sides of your head. These headaches can also cause other symptoms.  This condition may be treated with medicines and changes to your lifestyle.  Keep a journal to find out what may bring on your migraine headaches.  Contact a doctor if you get a migraine headache that is different or worse than others you have had.  Contact your doctor if you have more than 15 headache days in a month. This information is not intended to replace advice given to you by your health care provider. Make sure you discuss any questions you have with your health care provider. Document Released: 12/27/2007 Document Revised: 07/11/2018 Document Reviewed: 05/01/2018 Elsevier Patient Education  2020 [employer]

## 2018-10-28 NOTE — Progress Notes (Signed)
Made any corrections needed, and agree with history, physical, neuro exam,assessment and plan as stated.     Avie Checo, MD [facility]  

## 2018-10-28 NOTE — Progress Notes (Signed)
PATIENT: Tracy Mcconnell DOB: 09/24/1995  REASON FOR VISIT: follow up HISTORY FROM: patient  Chief Complaint  Patient presents with  . Follow-up    New Room, alone. Migraine f/u. Still has migraines.   . Migraine     HISTORY OF PRESENT ILLNESS: Today 10/28/18 Tracy Mcconnell is a 23 y.o. female here today for follow up of migraines. She continues to have frequent and debilitation migraines. She was seen in the ER multiple times this year for various complaints, most recently on 7/19 for migraine. She was treated with torodol and released. She has been having lower back pain treated with prednisone and baclofen. She reports that headaches have improved this week but prior she was having 3-4 migraines per week. We switched her from Ajovy to Amovig at her last appt in June. She has not noticed much difference in headaches since. She has bifrontal pain that is throbbing. Pain is often behind her eyes. She is sensitive to light and sound. Dark room and rest helps. She has not identified triggers. She does not drink caffeine. She does not drink, smoke or take street drugs or OTC analgesics. She does not snore. No dry mouth or excessive daytime sleepiness. She has tried and failed topiramate, amitriptyline, sertraline, clonidine, Ajovy, Amovig, Wellbutrin, gabapentin,  Imitrex, and Maxalt.    HISTORY: (copied from my note on 09/01/2018)  Tracy Mcconnell is a 23 y.o. female for follow up of migraines.  She reports that after starting a Ajovy her migraine days did reduced to about 14-15 a month.  Previously she was having daily migraines.  She has been on the Ajovy for at least 6 months.  She is hoping for a different medication to help decrease frequency even more.  She has tried and failed Topamax, amitriptyline and Imitrex in the past.   HISTORY (copied from Tracy Mcconnell's note on 12/16/2017)  [name] a 23 year old female with a history of migraine. She returns  today for follow-up. She remains on gabapentin 300 mg 3 times a day as well as a Ajovy.She states that she has been taking Ajovyfor 5 to 6 months. She reports that her headaches have significantly improved. She reports that she has 5migraine a month. Her headaches still occur behind the eyes and typicallystartson the right and then moves to the left. She does have photophobia and phonophobia as well as nausea andvomiting. She states that when she typically getsaheadache she will use an ice pack to go to sleep or she may use ibuprofen. She reports that her headache tends to resolve in 2 to 3 hours. She reports that starting about 2 weeks ago she began having dizziness. She states it occurs when she looks to the right or the left. She did go to urgent care and they diagnosed her with vertigo and gave her Antivert. She states that has been helping but she continues to have dizziness. She states that it occurs on a daily basis. She also reports that starting 2 days ago she began to have numbness in thetoes. This is limited to the toes. The patient's report of numbness and dizziness does not coincide with her migraine headaches. She returns today for evaluation.[name].  HISTORY03/07/19 [name] a 23 year old female with a history of migraine headaches. She returns today for follow-up. She was placed on gabapentin 300 mg 3 times a day at the last visit. She reports that ithas not offered her any benefit with her headaches. She continues to have approximately 4 migraines a month  reports that 1migraine can last almost a full week sometimes. She states that she only has 10 headache free days out of the month. She states that the headaches are behind her eyes and spread all over her head. She does have photophobia, phonophobia and nausea. Denies any vomiting. She denies any numbness or tingling in extremities. In the past she has tried Imitrex, Topamax and amitriptyline but was  unable to tolerate these medications. She returns today for evaluation.  HISTORYCaitlyn [name] a 23 y.o.femalehere as a referral from Dr. Franchot MimesWebsterfor migraines, anxiety. She has a FHx of migraines in mother. She has had migraines since the 5. Her migraines are unilateral, move to the other side, behind the eyes, squeezing, pulsating, bad nausea but no vomiting, she has light and sound sensitivity, she has to sit perfectly still because movement exacerbates it. She has headaches every day, she wakes up with them. If she spends too much time in the sunlight she can get a migraine, stress. She has had daily headaches in the last year, can last all day long some migraines last a week, every headache is migrainous. No medication overuse. Rarely she has an aura, she can have a cloud sometimes on the left but not always. Not sleeping at night, there is a lot of stress. Laying down in the dark helps. Grandmother with brain tumor, unknown kind.  Reviewed notes, labs and imaging from outside physicians, which showed:   Medications tried for migraine: Sertraline, topiramate(for a year, 100mg ), amitriptyline(for a year), Imitrex(bad reaction), clonidine.  Patient has 2-4 migraines per month. Failure or intolerance to Imitrex, Topamax and amitriptyline. She has a history of migraine headache with and without aura, currently having several headaches per month, has been unable to tolerate multiple medications, no status migrainosus.  Reviewed emergency room notes she has been in the emergency roomm ultiple times in the last year for anxiety presenting as chest pain and dizziness, chest x-ray was normal, heart racing, chest pain, sharp in the middle of her chest, lightheaded but no syncopal episode    REVIEW OF SYSTEMS: Out of a complete 14 system review of symptoms, the patient complains only of the following symptoms,  Dizziness, numbness, weakness and all other reviewed systems are negative.    ALLERGIES: Allergies  Allergen Reactions  . Imitrex [Sumatriptan] Shortness Of Breath    Entire body felt heavy    HOME MEDICATIONS: Outpatient Medications Prior to Visit  Medication Sig Dispense Refill  . acetaminophen (TYLENOL) 500 MG tablet Take 500 mg by mouth every 6 (six) hours as needed (pain).    [name]. buPROPion (WELLBUTRIN XL) 150 MG 24 hr tablet   0  . busPIRone (BUSPAR) 10 MG tablet   0  . Erenumab-aooe 140 MG/ML SOAJ Inject 140 mg into the skin every 30 (thirty) days. 3 pen 3  . escitalopram (LEXAPRO) 20 MG tablet Take 20 mg by mouth at bedtime.    . gabapentin (NEURONTIN) 300 MG capsule TAKE 1 CAPSULE BY MOUTH THREE TIMES A DAY 90 capsule 1  . hydrOXYzine (ATARAX/VISTARIL) 50 MG tablet Take 50 mg by mouth 3 (three) times daily as needed.    [name]. ibuprofen (ADVIL,MOTRIN) 400 MG tablet Take 1 tablet (400 mg total) by mouth every 6 (six) hours as needed. 30 tablet 0  . meclizine (ANTIVERT) 25 MG tablet Take 1 tablet (25 mg total) by mouth 3 (three) times daily as needed for dizziness. 30 tablet 0  . methocarbamol (ROBAXIN) 500 MG tablet Take 1 tablet (500  mg total) by mouth 2 (two) times daily. 20 tablet 0  . predniSONE (DELTASONE) 50 MG tablet Take 1 tablet (50 mg total) by mouth daily. 5 tablet 0  . rizatriptan (MAXALT-MLT) 10 MG disintegrating tablet Take 1 tablet (10 mg total) by mouth as needed for migraine. May repeat in 2 hours if needed 9 tablet 11  . cloNIDine (CATAPRES) 0.1 MG tablet Take 0.1-0.2 mg at bedtime as needed by mouth.     No facility-administered medications prior to visit.     PAST MEDICAL HISTORY: Past Medical History:  Diagnosis Date  . Migraines     PAST SURGICAL HISTORY: Past Surgical History:  Procedure Laterality Date  . NO PAST SURGERIES      FAMILY HISTORY: Family History  Problem Relation Age of Onset  . Migraines Mother   . Thyroid disease Mother   . Diabetes Paternal Grandfather     SOCIAL HISTORY: Social History   Socioeconomic  History  . Marital status: Single    Spouse name: Not on file  . Number of children: 0  . Years of education: college student  . Highest education level: Not on file  Occupational History  . Not on file  Social Needs  . Financial resource strain: Not on file  . Food insecurity    Worry: Not on file    Inability: Not on file  . Transportation needs    Medical: No    Non-medical: No  Tobacco Use  . Smoking status: Never Smoker  . Smokeless tobacco: Never Used  Substance and Sexual Activity  . Alcohol use: No  . Drug use: No  . Sexual activity: Not on file  Lifestyle  . Physical activity    Days per week: Not on file    Minutes per session: Not on file  . Stress: Not on file  Relationships  . Social Musicianconnections    Talks on phone: Not on file    Gets together: Not on file    Attends religious service: Not on file    Active member of club or organization: Not on file    Attends meetings of clubs or organizations: Not on file    Relationship status: Not on file  . Intimate partner violence    Fear of current or ex partner: Not on file    Emotionally abused: Not on file    Physically abused: Not on file    Forced sexual activity: Not on file  Other Topics Concern  . Not on file  Social History Narrative   Lives at home alone   Right handed   Does not drink caffeine, makes her nauseated      PHYSICAL EXAM  Vitals:   10/28/18 1354  BP: 110/70  Pulse: 97  Weight: 130 lb 6.4 oz (59.1 kg)  Height: 5\' 1"  (1.549 m)   Body mass index is 24.64 kg/m.  Generalized: Well developed, in no acute distress  Cardiology: normal rate and rhythm, no murmur noted Neurological examination  Mentation: Alert oriented to time, place, history taking. Follows all commands speech and language fluent Cranial nerve II-XII: Pupils were equal round reactive to light. Extraocular movements were full, visual field were full on confrontational test. Facial sensation and strength were normal.  Uvula tongue midline. Head turning and shoulder shrug  were normal and symmetric. Motor: The motor testing reveals 5 over 5 strength of all 4 extremities. Good symmetric motor tone is noted throughout.  Sensory: Sensory testing is intact to soft  touch on all 4 extremities. No evidence of extinction is noted.  Coordination: Cerebellar testing reveals good finger-nose-finger and heel-to-shin bilaterally.  Gait and station: Gait is normal. Tandem gait is normal. Romberg is negative. No drift is seen.    DIAGNOSTIC DATA (LABS, IMAGING, TESTING) - I reviewed patient records, labs, notes, testing and imaging myself where available.  No flowsheet data found.   Lab Results  Component Value Date   WBC 9.7 02/04/2017   HGB 13.4 02/04/2017   HCT 39.9 02/04/2017   MCV 85 02/04/2017   PLT 392 (H) 02/04/2017      Component Value Date/Time   NA 138 02/04/2017 1541   K 4.2 02/04/2017 1541   CL 100 02/04/2017 1541   CO2 19 (L) 02/04/2017 1541   GLUCOSE 73 02/04/2017 1541   GLUCOSE 101 (H) 02/29/2016 1513   BUN 13 02/04/2017 1541   CREATININE 0.74 02/04/2017 1541   CALCIUM 10.0 02/04/2017 1541   PROT 7.6 02/04/2017 1541   ALBUMIN 4.8 02/04/2017 1541   AST 19 02/04/2017 1541   ALT 9 02/04/2017 1541   ALKPHOS 107 02/04/2017 1541   BILITOT 0.5 02/04/2017 1541   GFRNONAA 116 02/04/2017 1541   GFRAA 134 02/04/2017 1541   No results found for: CHOL, HDL, LDLCALC, LDLDIRECT, TRIG, CHOLHDL No results found for: HGBA1C No results found for: VITAMINB12 Lab Results  Component Value Date   TSH 2.240 02/04/2017     ASSESSMENT AND PLAN 23 y.o. year old female  has a past medical history of Migraines. here with     ICD-10-CM   1. Chronic migraine without aura without status migrainosus, not intractable  G43.709     She continues to have frequent migraines after switching Ajovy to Amovig. She has not identified triggers. We have discussed headaches and treatment options including adding  magnesium, depakote or trying Botox therapy. We will continue gabapentin and Amovig. She will add magnesium OTC and wishes to start Botox therapy. I have educated her in detail of procedure, risks and benefits, including possible side effects. She has signed consent. She was advised to stay well hydrated, eat a well balanced diet and exercise regularly. I have also suggested she keep a migraine diary to help identify triggers. She will follow up in 3 months, sooner if needed. She verbalizes understanding and agreement with plan.    No orders of the defined types were placed in this encounter.    No orders of the defined types were placed in this encounter.     I spent 15 minutes with the patient. 50% of this time was spent counseling and educating patient on plan of care and medications.    Debbora Presto, FNP-C 10/28/2018, 3:04 PM [facility] [address], [place],  [address] [phone]

## 2018-10-28 NOTE — Telephone Encounter (Signed)
Patient wishes to start approval process for Botox therapy. Daily headaches with > half being migrainous with failure to multiple prior treatments. She has signed consent. Thank you!

## 2018-11-03 NOTE — Telephone Encounter (Signed)
I called the patient to schedule and left a message with the google assistant.

## 2018-11-06 DIAGNOSIS — M5416 Radiculopathy, lumbar region: Secondary | ICD-10-CM | POA: Diagnosis not present

## 2018-11-11 DIAGNOSIS — F4010 Social phobia, unspecified: Secondary | ICD-10-CM | POA: Diagnosis not present

## 2018-11-11 DIAGNOSIS — F339 Major depressive disorder, recurrent, unspecified: Secondary | ICD-10-CM | POA: Diagnosis not present

## 2018-11-11 DIAGNOSIS — F411 Generalized anxiety disorder: Secondary | ICD-10-CM | POA: Diagnosis not present

## 2018-12-02 ENCOUNTER — Telehealth: Payer: Self-pay

## 2018-12-02 NOTE — Telephone Encounter (Signed)
Received a fax Via covermymeds. Covermymeds automatically started a electronic PA.  I was able to continue pts therapy by submitting a renewal ePA online.  The key is: [id number].  Request was approved.

## 2018-12-03 ENCOUNTER — Ambulatory Visit: Payer: BLUE CROSS/BLUE SHIELD | Admitting: Family Medicine

## 2018-12-09 DIAGNOSIS — F4010 Social phobia, unspecified: Secondary | ICD-10-CM | POA: Diagnosis not present

## 2018-12-09 DIAGNOSIS — F411 Generalized anxiety disorder: Secondary | ICD-10-CM | POA: Diagnosis not present

## 2018-12-09 DIAGNOSIS — F339 Major depressive disorder, recurrent, unspecified: Secondary | ICD-10-CM | POA: Diagnosis not present

## 2018-12-11 ENCOUNTER — Other Ambulatory Visit: Payer: Self-pay | Admitting: *Deleted

## 2018-12-11 MED ORDER — GABAPENTIN 300 MG PO CAPS: ORAL_CAPSULE | ORAL | 1 refills | Status: DC

## 2019-01-28 ENCOUNTER — Encounter: Payer: Self-pay | Admitting: Family Medicine

## 2019-01-28 ENCOUNTER — Ambulatory Visit: Payer: Self-pay | Admitting: Family Medicine

## 2019-01-28 ENCOUNTER — Other Ambulatory Visit: Payer: Self-pay

## 2019-01-28 VITALS — BP 113/80 | HR 78 | Temp 97.5°F | Ht 61.0 in | Wt 138.2 lb

## 2019-01-28 DIAGNOSIS — G43719 Chronic migraine without aura, intractable, without status migrainosus: Secondary | ICD-10-CM

## 2019-01-28 NOTE — Patient Instructions (Signed)
We will get you scheduled for [facility]OT[facility]. Please continue current therapy.    Neck Exercises Ask your health care provider which exercises are safe for you. Do exercises exactly as told by your health care provider and adjust them as directed. It is normal to feel mild stretching, pulling, tightness, or discomfort as you do these exercises. Stop right away if you feel sudden pain or your pain gets worse. Do not begin these exercises until told by your health care provider. Neck exercises can be important for many reasons. They can improve strength and maintain flexibility in your neck, which will help your upper back and prevent neck pain. Stretching exercises Rotation neck stretching  1. Sit in a chair or stand up. 2. Place your feet flat on the floor, shoulder width apart. 3. Slowly turn your head (rotate) to the right until a slight stretch is felt. Turn it all the way to the right so you can look over your right shoulder. Do not tilt or tip your head. 4. Hold this position for 10-30 seconds. 5. Slowly turn your head (rotate) to the left until a slight stretch is felt. Turn it all the way to the left so you can look over your left shoulder. Do not tilt or tip your head. 6. Hold this position for 10-30 seconds. Repeat __________ times. Complete this exercise __________ times a day. Neck retraction 1. Sit in a sturdy chair or stand up. 2. Look straight ahead. Do not bend your neck. 3. Use your fingers to push your chin backward (retraction). Do not bend your neck for this movement. Continue to face straight ahead. If you are doing the exercise properly, you will feel a slight sensation in your throat and a stretch at the back of your neck. 4. Hold the stretch for 1-2 seconds. Repeat __________ times. Complete this exercise __________ times a day. Strengthening exercises Neck press 1. Lie on your back on a firm bed or on the floor with a pillow under your head. 2. Use your neck muscles to push  your head down on the pillow and straighten your spine. 3. Hold the position as well as you can. Keep your head facing up (in a neutral position) and your chin tucked. 4. Slowly count to 5 while holding this position. Repeat __________ times. Complete this exercise __________ times a day. Isometrics These are exercises in which you strengthen the muscles in your neck while keeping your neck still (isometrics). 1. Sit in a supportive chair and place your hand on your forehead. 2. Keep your head and face facing straight ahead. Do not flex or extend your neck while doing isometrics. 3. Push forward with your head and neck while pushing back with your hand. Hold for 10 seconds. 4. Do the sequence again, this time putting your hand against the back of your head. Use your head and neck to push backward against the hand pressure. 5. Finally, do the same exercise on either side of your head, pushing sideways against the pressure of your hand. Repeat __________ times. Complete this exercise __________ times a day. Prone head lifts 1. Lie face-down (prone position), resting on your elbows so that your chest and upper back are raised. 2. Start with your head facing downward, near your chest. Position your chin either on or near your chest. 3. Slowly lift your head upward. Lift until you are looking straight ahead. Then continue lifting your head as far back as you can comfortably stretch. 4. Hold your head  up for 5 seconds. Then slowly lower it to your starting position. Repeat __________ times. Complete this exercise __________ times a day. Supine head lifts 1. Lie on your back (supine position), bending your knees to point to the ceiling and keeping your feet flat on the floor. 2. Lift your head slowly off the floor, raising your chin toward your chest. 3. Hold for 5 seconds. Repeat __________ times. Complete this exercise __________ times a day. Scapular retraction 1. Stand with your arms at your  sides. Look straight ahead. 2. Slowly pull both shoulders (scapulae) backward and downward (retraction) until you feel a stretch between your shoulder blades in your upper back. 3. Hold for 10-30 seconds. 4. Relax and repeat. Repeat __________ times. Complete this exercise __________ times a day. Contact a health care provider if:  Your neck pain or discomfort gets much worse when you do an exercise.  Your neck pain or discomfort does not improve within 2 hours after you exercise. If you have any of these problems, stop exercising right away. Do not do the exercises again unless your health care provider says that you can. Get help right away if:  You develop sudden, severe neck pain. If this happens, stop exercising right away. Do not do the exercises again unless your health care provider says that you can. This information is not intended to replace advice given to you by your health care provider. Make sure you discuss any questions you have with your health care provider. Document Released: 02/28/2015 Document Revised: 01/15/2018 Document Reviewed: 01/15/2018 Elsevier Patient Education  2020 [employer]  Temporomandibular Joint Syndrome  Temporomandibular joint syndrome (TMJ syndrome) is a condition that causes pain in the temporomandibular joints. These joints are located near your ears and allow your jaw to open and close. For people with TMJ syndrome, chewing, biting, or other movements of the jaw can be difficult or painful. TMJ syndrome is often mild and goes away within a few weeks. However, sometimes the condition becomes a long-term (chronic) problem. What are the causes? This condition may be caused by:  Grinding your teeth or clenching your jaw. Some people do this when they are under stress.  Arthritis.  Injury to the jaw.  Head or neck injury.  Teeth or dentures that are not aligned well. In some cases, the cause of TMJ syndrome may not be known. What are the  signs or symptoms? The most common symptom of this condition is an aching pain on the side of the head in the area of the TMJ. Other symptoms may include:  Pain when moving your jaw, such as when chewing or biting.  Being unable to open your jaw all the way.  Making a clicking sound when you open your mouth.  Headache.  Earache.  Neck or shoulder pain. How is this diagnosed? This condition may be diagnosed based on:  Your symptoms and medical history.  A physical exam. Your health care provider may check the range of motion of your jaw.  Imaging tests, such as X-rays or an MRI. You may also need to see your dentist, who will determine if your teeth and jaw are lined up correctly. How is this treated? TMJ syndrome often goes away on its own. If treatment is needed, the options may include:  Eating soft foods and applying ice or heat.  Medicines to relieve pain or inflammation.  Medicines or massage to relax the muscles.  A splint, bite plate, or mouthpiece to prevent teeth grinding  or jaw clenching.  Relaxation techniques or counseling to help reduce stress.  A therapy for pain in which an electrical current is applied to the nerves through the skin (transcutaneous electrical nerve stimulation).  Acupuncture. This is sometimes helpful to relieve pain.  Jaw surgery. This is rarely needed. Follow these instructions at home:  Eating and drinking  Eat a soft diet if you are having trouble chewing.  Avoid foods that require a lot of chewing. Do not chew gum. General instructions  Take over-the-counter and prescription medicines only as told by your health care provider.  If directed, put ice on the painful area. ? Put ice in a plastic bag. ? Place a towel between your skin and the bag. ? Leave the ice on for 20 minutes, 2-3 times a day.  Apply a warm, wet cloth (warm compress) to the painful area as directed.  Massage your jaw area and do any jaw stretching  exercises as told by your health care provider.  If you were given a splint, bite plate, or mouthpiece, wear it as told by your health care provider.  Keep all follow-up visits as told by your health care provider. This is important. Contact a health care provider if:  You are having trouble eating.  You have new or worsening symptoms. Get help right away if:  Your jaw locks open or closed. Summary  Temporomandibular joint syndrome (TMJ syndrome) is a condition that causes pain in the temporomandibular joints. These joints are located near your ears and allow your jaw to open and close.  TMJ syndrome is often mild and goes away within a few weeks. However, sometimes the condition becomes a long-term (chronic) problem.  Symptoms include an aching pain on the side of the head in the area of the TMJ, pain when chewing or biting, and being unable to open your jaw all the way. You may also make a clicking sound when you open your mouth.  TMJ syndrome often goes away on its own. If treatment is needed, it may include medicines to relieve pain, reduce inflammation, or relax the muscles. A splint, bite plate, or mouthpiece may also be used to prevent teeth grinding or jaw clenching. This information is not intended to replace advice given to you by your health care provider. Make sure you discuss any questions you have with your health care provider. Document Released: 12/12/2000 Document Revised: 05/31/2017 Document Reviewed: 04/30/2017 Elsevier Patient Education  2020 [employer].    Migraine Headache A migraine headache is a very strong throbbing pain on one side or both sides of your head. This type of headache can also cause other symptoms. It can last from 4 hours to 3 days. Talk with your doctor about what things may bring on (trigger) this condition. What are the causes? The exact cause of this condition is not known. This condition may be triggered or caused by:  Drinking alcohol.   Smoking.  Taking medicines, such as: ? Medicine used to treat chest pain (nitroglycerin). ? Birth control pills. ? Estrogen. ? Some blood pressure medicines.  Eating or drinking certain products.  Doing physical activity. Other things that may trigger a migraine headache include:  Having a menstrual period.  Pregnancy.  Hunger.  Stress.  Not getting enough sleep or getting too much sleep.  Weather changes.  Tiredness (fatigue). What increases the risk?  Being 64-26 years old.  Being female.  Having a family history of migraine headaches.  Being Caucasian.  Having depression  or anxiety.  Being very overweight. What are the signs or symptoms?  A throbbing pain. This pain may: ? Happen in any area of the head, such as on one side or both sides. ? Make it hard to do daily activities. ? Get worse with physical activity. ? Get worse around bright lights or loud noises.  Other symptoms may include: ? Feeling sick to your stomach (nauseous). ? Vomiting. ? Dizziness. ? Being sensitive to bright lights, loud noises, or smells.  Before you get a migraine headache, you may get warning signs (an aura). An aura may include: ? Seeing flashing lights or having blind spots. ? Seeing bright spots, halos, or zigzag lines. ? Having tunnel vision or blurred vision. ? Having numbness or a tingling feeling. ? Having trouble talking. ? Having weak muscles.  Some people have symptoms after a migraine headache (postdromal phase), such as: ? Tiredness. ? Trouble thinking (concentrating). How is this treated?  Taking medicines that: ? Relieve pain. ? Relieve the feeling of being sick to your stomach. ? Prevent migraine headaches.  Treatment may also include: ? Having acupuncture. ? Avoiding foods that bring on migraine headaches. ? Learning ways to control your body functions (biofeedback). ? Therapy to help you know and deal with negative thoughts (cognitive  behavioral therapy). Follow these instructions at home: Medicines  Take over-the-counter and prescription medicines only as told by your doctor.  Ask your doctor if the medicine prescribed to you: ? Requires you to avoid driving or using heavy machinery. ? Can cause trouble pooping (constipation). You may need to take these steps to prevent or treat trouble pooping:  Drink enough fluid to keep your pee (urine) pale yellow.  Take over-the-counter or prescription medicines.  Eat foods that are high in fiber. These include beans, whole grains, and fresh fruits and vegetables.  Limit foods that are high in fat and sugar. These include fried or sweet foods. Lifestyle  Do not drink alcohol.  Do not use any products that contain nicotine or tobacco, such as cigarettes, e-cigarettes, and chewing tobacco. If you need help quitting, ask your doctor.  Get at least 8 hours of sleep every night.  Limit and deal with stress. General instructions      Keep a journal to find out what may bring on your migraine headaches. For example, write down: ? What you eat and drink. ? How much sleep you get. ? Any change in what you eat or drink. ? Any change in your medicines.  If you have a migraine headache: ? Avoid things that make your symptoms worse, such as bright lights. ? It may help to lie down in a dark, quiet room. ? Do not drive or use heavy machinery. ? Ask your doctor what activities are safe for you.  Keep all follow-up visits as told by your doctor. This is important. Contact a doctor if:  You get a migraine headache that is different or worse than others you have had.  You have more than 15 headache days in one month. Get help right away if:  Your migraine headache gets very bad.  Your migraine headache lasts longer than 72 hours.  You have a fever.  You have a stiff neck.  You have trouble seeing.  Your muscles feel weak or like you cannot control them.  You  start to lose your balance a lot.  You start to have trouble walking.  You pass out (faint).  You have a seizure. Summary  A migraine headache is a very strong throbbing pain on one side or both sides of your head. These headaches can also cause other symptoms.  This condition may be treated with medicines and changes to your lifestyle.  Keep a journal to find out what may bring on your migraine headaches.  Contact a doctor if you get a migraine headache that is different or worse than others you have had.  Contact your doctor if you have more than 15 headache days in a month. This information is not intended to replace advice given to you by your health care provider. Make sure you discuss any questions you have with your health care provider. Document Released: 12/27/2007 Document Revised: 07/11/2018 Document Reviewed: 05/01/2018 Elsevier Patient Education  2020 [employer]

## 2019-01-28 NOTE — Progress Notes (Addendum)
PATIENT: Tracy Mcconnell DOB: 1995-08-22  REASON FOR VISIT: follow up HISTORY FROM: patient  Chief Complaint  Patient presents with   Follow-up    Room 2,      HISTORY OF PRESENT ILLNESS: Today 01/28/19 Tracy Mcconnell is a 23 y.o. female here today for follow up for migraines. She reports that headaches have improved somewhat since switching to Amovig. She is having 20-25 headache days. She has 10-12 migrainous days. She not longer having numbness or difficulty moving extremities when she get a bad headache. She continues gabapentin 315m three times daily. She added magnesium as well that she feels has helped some. Rizatriptan works for abortive therapy. She reports that she is now having neck pain. This started two weeks  She could not look to the right. Pain is mostly on the right side. She feels that maybe she slept wrong but it is taking a while to go away. It has improved over the past week. She has used lidocaine patches and heat that helps some. She is also having pain in jaw. She feels that it is tender when chewing food. She does have radiating pain to the center of her head. She has not been seen by dentist. No history of TMJ.    HISTORY: (copied from  note on 10/28/2018)  Tracy Mcconnell is a 23y.o. female here today for follow up of migraines. She continues to have frequent and debilitation migraines. She was seen in the ER multiple times this year for various complaints, most recently on 7/19 for migraine. She was treated with torodol and released. She has been having lower back pain treated with prednisone and baclofen. She reports that headaches have improved this week but prior she was having 3-4 migraines per week. We switched her from AL[place]at her last appt in June. She has not noticed much difference in headaches since. She has bifrontal pain that is throbbing. Pain is often behind her eyes. She is sensitive to light and sound. Dark room  and rest helps. She has not identified triggers. She does not drink caffeine. She does not drink, smoke or take street drugs or OTC analgesics. She does not snore. No dry mouth or excessive daytime sleepiness. She has tried and failed topiramate, amitriptyline, sertraline, clonidine, Ajovy, Amovig, Wellbutrin, gabapentin,  Imitrex, and Maxalt.    HISTORY: (copied from my note on 09/01/2018)  Tracy Ramsey-[name] a 23y.o.femalefor follow up of migraines.She reports that after starting a Ajovyher migraine days did reduced to about 14-15 a month. Previously she was having daily migraines. She has been on the Ajovyfor at least 6 months. She is hoping for a different medication to help decrease frequency even more. She has tried and failed Topamax, amitriptyline and Imitrex in the past.   [facility]  Ms. Mcconnell Tracy 23year old female with a history of migraine. She returns today for follow-up. She remains on gabapentin 300 mg 3 times a day as well as a Ajovy.She states that she has been taking Ajovyfor 5 to 6 months. She reports that her headaches have significantly improved. She reports that she has 520mraine a month. Her headaches still occur behind the eyes and typicallystartson the right and then moves to the left. She does have photophobia and phonophobia as well as nausea andvomiting. She states that when she typically getsaheadache she will use an ice pack to go to sleep or she may use ibuprofen. She reports that her headache tends  to resolve in 2 to 3 hours. She reports that starting about 2 weeks ago she began having dizziness. She states it occurs when she looks to the right or the left. She did go to urgent care and they diagnosed her with vertigo and gave her Antivert. She states that has been helping but she continues to have dizziness. She states that it occurs on a daily basis. She also reports that  starting 2 days ago she began to have numbness in thetoes. This is limited to the toes. The patient's report of numbness and dizziness does not coincide with her migraine headaches. She returns today for evaluation.Tracy Mcconnell  HISTORY03/07/19 [name] a 23 year old female with a history of migraine headaches. She returns today for follow-up. She was placed on gabapentin 300 mg 3 times a day at the last visit. She reports that ithas not offered her any benefit with her headaches. She continues to have approximately 4 migraines a month reports that 65mgraine can last almost a full week sometimes. She states that she only has 10 headache free days out of the month. She states that the headaches are behind her eyes and spread all over her head. She does have photophobia, phonophobia and nausea. Denies any vomiting. She denies any numbness or tingling in extremities. In the past she has tried Imitrex, Topamax and amitriptyline but was unable to tolerate these medications. She returns today for evaluation.  HISTORYCaitlyn [name] a 23 y.o.femalehere as a referral from Dr. WRoosvelt Masermigraines, anxiety. She has a FHx of migraines in mother. She has had migraines since the 5. Her migraines are unilateral, move to the other side, behind the eyes, squeezing, pulsating, bad nausea but no vomiting, she has light and sound sensitivity, she has to sit perfectly still because movement exacerbates it. She has headaches every day, she wakes up with them. If she spends too much time in the sunlight she can get a migraine, stress. She has had daily headaches in the last year, can last all day long some migraines last a week, every headache is migrainous. No medication overuse. Rarely she has an aura, she can have a cloud sometimes on the left but not always. Not sleeping at night, there is a lot of stress. Laying down in the dark helps. Grandmother with brain tumor, unknown kind.  Reviewed notes, labs  and imaging from outside physicians, which showed:   Medications tried for migraine: Sertraline, topiramate(for a year, 1052m, amitriptyline(for a year), Imitrex(bad reaction), clonidine.  Patient has 2-4 migraines per month. Failure or intolerance to Imitrex, Topamax and amitriptyline. She has a history of migraine headache with and without aura, currently having several headaches per month, has been unable to tolerate multiple medications, no status migrainosus.  Reviewed emergency room notes she has been in the emergency roomm ultiple times in the last year for anxiety presenting as chest pain and dizziness, chest x-ray was normal, heart racing, chest pain, sharp in the middle of her chest, lightheaded but no syncopal episode   REVIEW OF SYSTEMS: Out of a complete 14 system review of symptoms, the patient complains only of the following symptoms, headaches and all other reviewed systems are negative.  ALLERGIES: Allergies  Allergen Reactions   Sumatriptan Shortness Of Breath and Other (See Comments)    Entire body felt heavy    HOME MEDICATIONS: Outpatient Medications Prior to Visit  Medication Sig Dispense Refill   acetaminophen (TYLENOL) 500 MG tablet Take 500 mg by mouth every 6 (six) hours as needed (  pain).     buPROPion (WELLBUTRIN XL) 150 MG 24 hr tablet   0   busPIRone (BUSPAR) 10 MG tablet 15 mg 2 (two) times daily.   0   Erenumab-aooe 140 MG/ML SOAJ Inject 140 mg into the skin every 30 (thirty) days. 3 pen 3   gabapentin (NEURONTIN) 300 MG capsule TAKE 1 CAPSULE BY MOUTH THREE TIMES A DAY 90 capsule 1   hydrOXYzine (ATARAX/VISTARIL) 50 MG tablet Take 50 mg by mouth 3 (three) times daily as needed.     ibuprofen (ADVIL,MOTRIN) 400 MG tablet Take 1 tablet (400 mg total) by mouth every 6 (six) hours as needed. 30 tablet 0   meclizine (ANTIVERT) 25 MG tablet Take 1 tablet (25 mg total) by mouth 3 (three) times daily as needed for dizziness. 30 tablet 0   rizatriptan  (MAXALT-MLT) 10 MG disintegrating tablet Take 1 tablet (10 mg total) by mouth as needed for migraine. May repeat in 2 hours if needed 9 tablet 11   escitalopram (LEXAPRO) 20 MG tablet Take 20 mg by mouth at bedtime.     methocarbamol (ROBAXIN) 500 MG tablet Take 1 tablet (500 mg total) by mouth 2 (two) times daily. 20 tablet 0   predniSONE (DELTASONE) 50 MG tablet Take 1 tablet (50 mg total) by mouth daily. 5 tablet 0   No facility-administered medications prior to visit.     PAST MEDICAL HISTORY: Past Medical History:  Diagnosis Date   Migraines     PAST SURGICAL HISTORY: Past Surgical History:  Procedure Laterality Date   NO PAST SURGERIES      FAMILY HISTORY: Family History  Problem Relation Age of Onset   Migraines Mother    Thyroid disease Mother    Diabetes Paternal Grandfather     SOCIAL HISTORY: Social History   Socioeconomic History   Marital status: Single    Spouse name: Not on file   Number of children: 0   Years of education: college student   Highest education level: Not on file  Occupational History   Not on file  Social Needs   Financial resource strain: Not on file   Food insecurity    Worry: Not on file    Inability: Not on file   Transportation needs    Medical: No    Non-medical: No  Tobacco Use   Smoking status: Never Smoker   Smokeless tobacco: Never Used  Substance and Sexual Activity   Alcohol use: No   Drug use: No   Sexual activity: Not on file  Lifestyle   Physical activity    Days per week: Not on file    Minutes per session: Not on file   Stress: Not on file  Relationships   Social connections    Talks on phone: Not on file    Gets together: Not on file    Attends religious service: Not on file    Active member of club or organization: Not on file    Attends meetings of clubs or organizations: Not on file    Relationship status: Not on file   Intimate partner violence    Fear of current or ex  partner: Not on file    Emotionally abused: Not on file    Physically abused: Not on file    Forced sexual activity: Not on file  Other Topics Concern   Not on file  Social History Narrative   Lives at home alone   Right handed   Does not  drink caffeine, makes her nauseated      PHYSICAL EXAM  Vitals:   01/28/19 1409  BP: 113/80  Pulse: 78  Temp: (!) 97.5 F (36.4 C)  Weight: 138 lb 3.2 oz (62.7 kg)  Height: 5' 1"  (1.549 m)   Body mass index is 26.11 kg/m.  Generalized: Well developed, in no acute distress  Cardiology: normal rate and rhythm, no murmur noted Neurological examination  Mentation: Alert oriented to time, place, history taking. Follows all commands speech and language fluent Cranial nerve II-XII: Pupils were equal round reactive to light. Extraocular movements were full, visual field were full on confrontational test. Facial sensation and strength were normal. Uvula tongue midline. Head turning and shoulder shrug  were normal and symmetric. Motor: The motor testing reveals 5 over 5 strength of all 4 extremities. Good symmetric motor tone is noted throughout.  Gait and station: Gait is normal.   DIAGNOSTIC DATA (LABS, IMAGING, TESTING) - I reviewed patient records, labs, notes, testing and imaging myself where available.  No flowsheet data found.   Lab Results  Component Value Date   WBC 9.7 02/04/2017   HGB 13.4 02/04/2017   HCT 39.9 02/04/2017   MCV 85 02/04/2017   PLT 392 (H) 02/04/2017      Component Value Date/Time   NA 138 02/04/2017 1541   K 4.2 02/04/2017 1541   CL 100 02/04/2017 1541   CO2 19 (L) 02/04/2017 1541   GLUCOSE 73 02/04/2017 1541   GLUCOSE 101 (H) 02/29/2016 1513   BUN 13 02/04/2017 1541   CREATININE 0.74 02/04/2017 1541   CALCIUM 10.0 02/04/2017 1541   PROT 7.6 02/04/2017 1541   ALBUMIN 4.8 02/04/2017 1541   AST 19 02/04/2017 1541   ALT 9 02/04/2017 1541   ALKPHOS 107 02/04/2017 1541   BILITOT 0.5 02/04/2017 1541    GFRNONAA 116 02/04/2017 1541   GFRAA 134 02/04/2017 1541   No results found for: CHOL, HDL, LDLCALC, LDLDIRECT, TRIG, CHOLHDL No results found for: HGBA1C No results found for: VITAMINB12 Lab Results  Component Value Date   TSH 2.240 02/04/2017    ASSESSMENT AND PLAN 23 y.o. year old female  has a past medical history of Migraines. here with     ICD-10-CM   1. Chronic migraine without aura, intractable, without status migrainosus  G43.719     Zowie is doing somewhat better with Aimovig and magnesium added to gabapentin at last visit.  Unfortunately, she continues to have regular headaches.  Botox has been approved and I feel that this would be beneficial in helping to get better control of her migraines.  She has met with Andee Poles today and scheduled first Botox procedure.  We will continue current therapy.  Adequate hydration and regular exercise advised.  Stress management also discussed.  I am hopeful that neck pain will continue to improve.  I have advised that she reach out to her dentist should jaw pain continue as this can be concerning for TMJ.  No obvious abnormalities on exam today.  She verbalizes understanding and agreement with this plan.   No orders of the defined types were placed in this encounter.    No orders of the defined types were placed in this encounter.     I spent 15 minutes with the patient. 50% of this time was spent counseling and educating patient on plan of care and medications.    Debbora Presto, FNP-C 01/28/2019, 3:54 PM [facility] [address] [place], [place]  [address] [phone]  Made any corrections needed, and agree with history, physical, neuro exam,assessment and plan as stated.     Sarina Ill, MD [facility]

## 2019-02-01 DIAGNOSIS — Z20828 Contact with and (suspected) exposure to other viral communicable diseases: Secondary | ICD-10-CM | POA: Diagnosis not present

## 2019-02-11 NOTE — Telephone Encounter (Signed)
Patient called back and stated that she was inactive and her insurance would go back into affect in December.

## 2019-02-11 NOTE — Telephone Encounter (Signed)
I called and made patient aware, she does not know why it is saying she is inactive and she is going to reach out to them and call me back. DW

## 2019-02-11 NOTE — Telephone Encounter (Signed)
Patients insurance came back inactive.

## 2019-02-12 ENCOUNTER — Ambulatory Visit: Payer: Self-pay | Admitting: Family Medicine

## 2019-02-17 ENCOUNTER — Telehealth: Payer: Self-pay

## 2019-02-17 NOTE — Telephone Encounter (Signed)
Started a PA renewal for Medication VIA CMM.  Medication: AIMOVIG 140MG /ML  Key: [id number].   Awaiting response.

## 2019-02-17 NOTE — Telephone Encounter (Signed)
Received a fax regarding pts [employer]. Stating that the Patient was no longer using them as her insurance.   Contacted the pt to confirm/deny the fax.   Pt informed me that her insurance paused/lapsed momentarily; however, it should be renewed by Dec 1.   Please see previous note regarding the PA for AIMOVIG 140MG /ML.  (will copy into this note.)  "Started a PA renewal for Medication VIA CMM.  Medication: AIMOVIG 140MG /ML  Key: [id number].   Awaiting response."

## 2019-03-05 ENCOUNTER — Ambulatory Visit (HOSPITAL_COMMUNITY)
Admission: EM | Admit: 2019-03-05 | Discharge: 2019-03-05 | Disposition: A | Discharge status: Home / Self Care | Payer: Medicaid Other | Attending: Internal Medicine | Admitting: Internal Medicine

## 2019-03-05 ENCOUNTER — Encounter (HOSPITAL_COMMUNITY): Payer: Self-pay | Admitting: Emergency Medicine

## 2019-03-05 ENCOUNTER — Other Ambulatory Visit: Payer: Self-pay

## 2019-03-05 DIAGNOSIS — R5383 Other fatigue: Secondary | ICD-10-CM

## 2019-03-05 DIAGNOSIS — R200 Anesthesia of skin: Secondary | ICD-10-CM | POA: Diagnosis not present

## 2019-03-05 LAB — CBC
HCT: 44.2 % (ref 36.0–46.0)
Hemoglobin: 15.2 g/dL — ABNORMAL HIGH (ref 12.0–15.0)
MCH: 29.6 pg (ref 26.0–34.0)
MCHC: 34.4 g/dL (ref 30.0–36.0)
MCV: 86 fL (ref 80.0–100.0)
Platelets: 390 10*3/uL (ref 150–400)
RBC: 5.14 MIL/uL — ABNORMAL HIGH (ref 3.87–5.11)
RDW: 12.2 % (ref 11.5–15.5)
WBC: 8.6 10*3/uL (ref 4.0–10.5)
nRBC: 0 % (ref 0.0–0.2)

## 2019-03-05 LAB — TSH: TSH: 1.543 u[IU]/mL (ref 0.350–4.500)

## 2019-03-05 LAB — VITAMIN D 25 HYDROXY (VIT D DEFICIENCY, FRACTURES): Vit D, 25-Hydroxy: 10.58 ng/mL — ABNORMAL LOW (ref 30–100)

## 2019-03-05 NOTE — ED Triage Notes (Signed)
Pt reports pain, numbness and tingling that is moving throughout her body.  She has had it in her right leg, her face, and now it is in her left arm.  It is currently only in her arm. Pt reports a history of migraines.

## 2019-03-05 NOTE — ED Provider Notes (Signed)
[facility]    CSN: [id number] Arrival date & time: 03/05/19  1445      History   Chief Complaint Chief Complaint  Patient presents with  . Generalized Body Aches  . Numbness  . Tingling    HPI Tracy Mcconnell is a 23 y.o. femalewith a history of migraines and anxiety comes to the urgent care with complains of numbness in the right forearm and right leg. Symptoms have been intermittent for sometime now. It is also associates with periorbital numbness. No known aggravating or relieving factors. No URI symptoms. No periorbital or eye symptoms.  Patient admits to worsening anxiety symptoms recently.She denies any worsening depressed mood. Social stresses seems to have increased recently after both siblings moved in to live with her.No suicidal ideation.No difficulty with speech.  HPI  Past Medical History:  Diagnosis Date  . Migraines     Patient Active Problem List   Diagnosis Date Noted  . Migraines 10/28/2018  . Chronic daily headache 02/04/2017    Past Surgical History:  Procedure Laterality Date  . NO PAST SURGERIES      OB History   No obstetric history on file.      Home Medications    Prior to Admission medications   Medication Sig Start Date End Date Taking? Authorizing Provider  acetaminophen (TYLENOL) 500 MG tablet Take 500 mg by mouth every 6 (six) hours as needed (pain).   Yes [provider]  buPROPion (WELLBUTRIN XL) 150 MG 24 hr tablet  05/09/17  Yes [provider]  busPIRone (BUSPAR) 10 MG tablet 15 mg 2 (two) times daily.  05/09/17  Yes [provider]  Erenumab-aooe 140 MG/ML SOAJ Inject 140 mg into the skin every 30 (thirty) days. 09/01/18  Yes Lomax, Amy, NP  gabapentin (NEURONTIN) 300 MG capsule TAKE 1 CAPSULE BY MOUTH THREE TIMES A DAY 12/11/18  Yes Penumalli, Vikram R, MD  hydrOXYzine (ATARAX/VISTARIL) 50 MG tablet Take 50 mg by mouth 3 (three) times daily as needed.   Yes [provider]   meclizine (ANTIVERT) 25 MG tablet Take 1 tablet (25 mg total) by mouth 3 (three) times daily as needed for dizziness. 12/21/17  Yes Drenda Freeze, MD  rizatriptan (MAXALT-MLT) 10 MG disintegrating tablet Take 1 tablet (10 mg total) by mouth as needed for migraine. May repeat in 2 hours if needed 09/01/18  Yes Lomax, Amy, NP  ibuprofen (ADVIL,MOTRIN) 400 MG tablet Take 1 tablet (400 mg total) by mouth every 6 (six) hours as needed. 02/27/16   Domenic Moras, PA-C    Family History Family History  Problem Relation Age of Onset  . Migraines Mother   . Thyroid disease Mother   . Diabetes Paternal Grandfather     Social History Social History   Tobacco Use  . Smoking status: Never Smoker  . Smokeless tobacco: Never Used  Substance Use Topics  . Alcohol use: No  . Drug use: No     Allergies   Sumatriptan   Review of Systems Review of Systems  Constitutional: Negative.   Respiratory: Negative.   Gastrointestinal: Negative.   Genitourinary: Negative.   Skin: Negative.   Neurological: Positive for dizziness and numbness. Negative for syncope, facial asymmetry, weakness, light-headedness and headaches.  Psychiatric/Behavioral: Negative for confusion, decreased concentration, dysphoric mood, hallucinations, self-injury, sleep disturbance and suicidal ideas. The patient is nervous/anxious. The patient is not hyperactive.      Physical Exam Triage Vital Signs ED Triage Vitals  Enc Vitals Group  BP 03/05/19 1530 126/83     Pulse Rate 03/05/19 1530 87     Resp 03/05/19 1530 12     Temp 03/05/19 1530 98.2 F (36.8 C)     Temp Source 03/05/19 1530 Oral     SpO2 03/05/19 1530 99 %     Weight --      Height --      Head Circumference --      Peak Flow --      Pain Score 03/05/19 1523 6     Pain Loc --      Pain Edu? --      Excl. in GC? --    No data found.  Updated Vital Signs BP 126/83 (BP Location: Right Arm)   Pulse 87   Temp 98.2 F (36.8 C) (Oral)   Resp 12    LMP 02/20/2019 (Exact Date)   SpO2 99%   Visual Acuity Right Eye Distance:   Left Eye Distance:   Bilateral Distance:    Right Eye Near:   Left Eye Near:    Bilateral Near:     Physical Exam Vitals signs and nursing note reviewed.  Constitutional:      General: She is not in acute distress. Cardiovascular:     Rate and Rhythm: Normal rate and regular rhythm.  Abdominal:     General: Bowel sounds are normal. There is no distension.     Tenderness: There is no guarding or rebound.  Musculoskeletal: Normal range of motion.  Skin:    General: Skin is warm.     Findings: No bruising or erythema.  Neurological:     General: No focal deficit present.     Mental Status: She is alert and oriented to person, place, and time.     Cranial Nerves: No cranial nerve deficit.     Sensory: No sensory deficit.     Motor: No weakness.     Coordination: Coordination normal.     Gait: Gait normal.     Deep Tendon Reflexes: Reflexes normal.      UC Treatments / Results  Labs (all labs ordered are listed, but only abnormal results are displayed) Labs Reviewed - No data to display  EKG   Radiology No results found.  Procedures Procedures (including critical care time)  Medications Ordered in UC Medications - No data to display  Initial Impression / Assessment and Plan / UC Course  I have reviewed the triage vital signs and the nursing notes.  Pertinent labs & imaging results that were available during my care of the patient were reviewed by me and considered in my medical decision making (see chart for details).     1. Numbness involving left forearm, right leg and periorbital area: Vitamin D level, TSH CBC Will follow labs and make further recommendation once lad results are available  Final Clinical Impressions(s) / UC Diagnoses   Final diagnoses:  None   Discharge Instructions   None    ED Prescriptions    None     PDMP not reviewed this encounter.    Merrilee Jansky, MD 03/06/19 [phone]

## 2019-03-06 ENCOUNTER — Telehealth (HOSPITAL_COMMUNITY): Payer: Self-pay | Admitting: Internal Medicine

## 2019-03-06 MED ORDER — VITAMIN E 180 MG (400 UNIT) PO CAPS: 400.0000 [IU] | ORAL_CAPSULE | Freq: Every day | ORAL | 3 refills | Status: DC

## 2019-03-06 MED ORDER — VITAMIN D (ERGOCALCIFEROL) 1.25 MG (50000 UNIT) PO CAPS: 50000.0000 [IU] | ORAL_CAPSULE | ORAL | 0 refills | Status: DC

## 2019-03-06 NOTE — Telephone Encounter (Signed)
Called patient to inform her about Vitamin D level of 10.58. I will send Vitamin D 50,000 IU weekly for 6 weeks followed by Vitamin D 400 IU daily.

## 2019-03-10 NOTE — Telephone Encounter (Signed)
Checked CMM to see the outcome now that 03/07/2023 has passed. PA was canceled. Awaiting a new Fax for Pa Request.

## 2019-03-11 NOTE — Telephone Encounter (Signed)
I called the patient to cancel apt. I spoke with [employer] and they are stating the patient is not active with them. She did not answer so I left a VM letting her know we could not authorize visit tomorrow.

## 2019-03-12 ENCOUNTER — Ambulatory Visit: Payer: Medicaid Other | Admitting: Family Medicine

## 2019-03-12 ENCOUNTER — Telehealth: Payer: Self-pay | Admitting: Family Medicine

## 2019-03-12 NOTE — Telephone Encounter (Signed)
I spoke with Horris Latino, the nurse reviewer at [facility] she reviewed the patients new policy but called to tell me the patient is not covered with [place] and would need to go somewhere in the [facility] system for best coverage. I called to make the patient aware but she did not answer so I LVM asking her to call me back. DW

## 2019-03-12 NOTE — Telephone Encounter (Signed)
[facility] @ [facility] called asking for [employer] information re: Botox.  She was informed that pt will wait until next year when her new insurance kicks in

## 2019-03-12 NOTE — Telephone Encounter (Signed)
I spoke with the patient and made her aware, she states that she is going to have new coverage in Jan. She is going to think about how she wants to proceed and call us back. DW

## 2019-03-12 NOTE — Telephone Encounter (Signed)
Pt called stating that since we are out of network she would like to wait till the next year when her new insurance kicks in to r/s. Please advise.

## 2019-03-14 ENCOUNTER — Encounter: Payer: Self-pay | Admitting: Family Medicine

## 2019-03-16 NOTE — Telephone Encounter (Signed)
Noted, thank you. DW  °

## 2019-03-28 ENCOUNTER — Encounter: Payer: Self-pay | Admitting: Family Medicine

## 2019-04-04 ENCOUNTER — Other Ambulatory Visit: Payer: Self-pay | Admitting: Diagnostic Neuroimaging

## 2019-04-07 ENCOUNTER — Encounter: Payer: Self-pay | Admitting: Adult Health Nurse Practitioner

## 2019-04-07 ENCOUNTER — Other Ambulatory Visit: Payer: Self-pay

## 2019-04-07 ENCOUNTER — Ambulatory Visit (INDEPENDENT_AMBULATORY_CARE_PROVIDER_SITE_OTHER): Payer: BC Managed Care – PPO | Admitting: Adult Health Nurse Practitioner

## 2019-04-07 VITALS — BP 104/70 | HR 89 | Temp 98.0°F | Ht 61.0 in | Wt 140.0 lb

## 2019-04-07 DIAGNOSIS — Z7689 Persons encountering health services in other specified circumstances: Secondary | ICD-10-CM

## 2019-04-07 DIAGNOSIS — D649 Anemia, unspecified: Secondary | ICD-10-CM

## 2019-04-07 DIAGNOSIS — Z23 Encounter for immunization: Secondary | ICD-10-CM | POA: Diagnosis not present

## 2019-04-07 DIAGNOSIS — E559 Vitamin D deficiency, unspecified: Secondary | ICD-10-CM | POA: Diagnosis not present

## 2019-04-07 HISTORY — DX: Vitamin D deficiency, unspecified: E55.9

## 2019-04-07 LAB — POCT URINALYSIS DIP (MANUAL ENTRY)
Bilirubin, UA: NEGATIVE
Glucose, UA: NEGATIVE mg/dL
Ketones, POC UA: NEGATIVE mg/dL
Leukocytes, UA: NEGATIVE
Nitrite, UA: NEGATIVE
Protein Ur, POC: NEGATIVE mg/dL
Spec Grav, UA: 1.02 (ref 1.010–1.025)
Urobilinogen, UA: 0.2 E.U./dL
pH, UA: 6.5 (ref 5.0–8.0)

## 2019-04-07 MED ORDER — VITAMIN D (ERGOCALCIFEROL) 1.25 MG (50000 UNIT) PO CAPS: 50000.0000 [IU] | ORAL_CAPSULE | ORAL | 0 refills | Status: DC

## 2019-04-07 NOTE — Progress Notes (Signed)
Chief Complaint  Patient presents with  . Establish Care    Chronic migrains. Pt stated that they are worsening and she sees a neuro.  [name] score 27    HPI  Patient is a new patient to [place].  She sees neurology for chronic migraines and the mood disorder center for her anxiety and depression.  Recently went to the ER about a month ago and had a very low vitamin D level and abnormal hemoglobin and hematocrit.  She does have heavy periods.  She has felt tired.  Endorses some shortness of breath intermittently.  Some chest pain with radiation down her left arm which she is attributed to migraines.  It has not happened in some time.  No jaw pain.  No palpitations.  No change in rate or rhythm.  Endorses more anxiety at this moment and is seeing her mood specialist this week.  Does not know why she is anxious.  Has not been exercising in the past year even though she used to be quite avid due to the effects of her migraines which cause tingling and difficulty in her extremities.  Currently working with her migraine team to get scheduled for Botox injections.  We discussed muscle spasms in her upper trapezius and neck as well as some that she intermittently gets in her lower back.  Discussed posture, muscle spasm, and related stretches that could improve some of this pain.  Given her vitamin D level we discussed managing it with prescription medication.  She is amenable to this.  Also, will look further to make sure she is not truly iron deficient.  Problem List    Problem List: 2020-07: Migraines 2018-11: Chronic daily headache   Allergies   is allergic to sumatriptan.  Medications    Current Outpatient Medications:  .  acetaminophen (TYLENOL) 500 MG tablet, Take 500 mg by mouth every 6 (six) hours as needed (pain)., Disp: , Rfl:  .  buPROPion (WELLBUTRIN XL) 150 MG 24 hr tablet, , Disp: , Rfl: 0 .  busPIRone (BUSPAR) 10 MG tablet, 15 mg 2 (two) times daily. , Disp: , Rfl: 0 .   Erenumab-aooe 140 MG/ML SOAJ, Inject 140 mg into the skin every 30 (thirty) days., Disp: 3 pen, Rfl: 3 .  FLUoxetine (PROZAC) 20 MG capsule, Take 20 mg by mouth daily., Disp: , Rfl:  .  gabapentin (NEURONTIN) 300 MG capsule, TAKE 1 CAPSULE BY MOUTH THREE TIMES A DAY, Disp: 90 capsule, Rfl: 1 .  hydrOXYzine (ATARAX/VISTARIL) 50 MG tablet, Take 50 mg by mouth 3 (three) times daily as needed., Disp: , Rfl:  .  ibuprofen (ADVIL,MOTRIN) 400 MG tablet, Take 1 tablet (400 mg total) by mouth every 6 (six) hours as needed., Disp: 30 tablet, Rfl: 0 .  LORazepam (ATIVAN) 0.5 MG tablet, Take 0.5 mg by mouth every 8 (eight) hours., Disp: , Rfl:  .  magnesium oxide (MAG-OX) 400 MG tablet, Take 400 mg by mouth daily., Disp: , Rfl:  .  meclizine (ANTIVERT) 25 MG tablet, Take 1 tablet (25 mg total) by mouth 3 (three) times daily as needed for dizziness., Disp: 30 tablet, Rfl: 0 .  rizatriptan (MAXALT-MLT) 10 MG disintegrating tablet, Take 1 tablet (10 mg total) by mouth as needed for migraine. May repeat in 2 hours if needed, Disp: 9 tablet, Rfl: 11 .  Vitamin D, Ergocalciferol, (DRISDOL) 1.25 MG (50000 UT) CAPS capsule, Take 1 capsule (50,000 Units total) by mouth every 7 (seven) days., Disp: 6 capsule, Rfl: 0 .  [  START ON 04/20/2019] vitamin E (VITAMIN E) 400 UNIT capsule, Take 1 capsule (400 Units total) by mouth daily. Start after weekly high dose vitamin D is completed., Disp: 90 capsule, Rfl: 3   Review of Systems    Constitutional: Negative for activity change, appetite change, chills and fever.  HENT: Negative for congestion, nosebleeds, trouble swallowing and voice change.   Respiratory: Negative for cough, shortness of breath and wheezing.   Gastrointestinal: Negative for diarrhea, nausea and vomiting.  Genitourinary: Negative for difficulty urinating, dysuria, flank pain and hematuria.  Musculoskeletal: Negative for back pain, joint swelling and neck pain.  Neurological: Negative for dizziness, speech  difficulty, light-headedness and numbness.  See HPI. All other review of systems negative.     Physical Exam:   Physical Examination: General appearance - alert, well appearing, and in no distress and oriented to person, place, and time Mental status - normal mood, behavior, speech, dress, motor activity, and thought processes Neck - supple, no significant adenopathy, carotids upstroke normal bilaterally, no bruits, thyroid exam: thyroid is normal in size without nodules or tenderness Chest - clear to auscultation, no wheezes, rales or rhonchi, symmetric air entry  Heart - normal rate, regular rhythm, normal S1, S2, no murmurs, rubs, clicks or gallops Extremities -no LE without clubbing or cyanosis Skin - normal coloration and turgor, no rashes, no suspicious skin lesions noted  No hyperpigmentation of skin.  No current hematomas noted   Lab Review   labs reviewed, I note that Vitamin D was low.   Assessment & Plan:  Tracy Mcconnell is a 24 y.o. female . 1. Anemia, unspecified type   2. Need for prophylactic vaccination and inoculation against influenza   3. Establishing care with new doctor, encounter for    Orders Placed This Encounter  Procedures  . Flu vaccine  . Basic metabolic panel  . Iron, TIBC and Ferritin Panel  . POCT urinalysis dipstick   Meds ordered this encounter  Medications  . Vitamin D, Ergocalciferol, (DRISDOL) 1.25 MG (50000 UT) CAPS capsule    Sig: Take 1 capsule (50,000 Units total) by mouth every 7 (seven) days.    Dispense:  6 capsule    Refill:  0   All questions were answered.  A total of 30 minutes were spent face-to-face with the patient during this encounter and over half of that time was spent on counseling and coordination of care.   Glyn Ade, NP

## 2019-04-07 NOTE — Patient Instructions (Signed)
° ° ° °  If you have lab work done today you will be contacted with your lab results within the next 2 weeks.  If you have not heard from us then please contact us. The fastest way to get your results is to register for My Chart. ° ° °IF you received an x-ray today, you will receive an invoice from [facility]. Please contact [facility] at [phone] with questions or concerns regarding your invoice.  ° °IF you received labwork today, you will receive an invoice from [facility]. Please contact [facility] at [phone] with questions or concerns regarding your invoice.  ° °Our billing staff will not be able to assist you with questions regarding bills from these companies. ° °You will be contacted with the lab results as soon as they are available. The fastest way to get your results is to activate your My Chart account. Instructions are located on the last page of this paperwork. If you have not heard from us regarding the results in 2 weeks, please contact this office. °  ° ° ° °

## 2019-04-08 ENCOUNTER — Telehealth: Payer: Self-pay | Admitting: Neurology

## 2019-04-08 LAB — BASIC METABOLIC PANEL
BUN/Creatinine Ratio: 9 (ref 9–23)
BUN: 8 mg/dL (ref 6–20)
CO2: 22 mmol/L (ref 20–29)
Calcium: 9.6 mg/dL (ref 8.7–10.2)
Chloride: 104 mmol/L (ref 96–106)
Creatinine, Ser: 0.9 mg/dL (ref 0.57–1.00)
GFR calc Af Amer: 104 mL/min/{1.73_m2} (ref >59)
GFR calc non Af Amer: 90 mL/min/{1.73_m2} (ref >59)
Glucose: 104 mg/dL — ABNORMAL HIGH (ref 65–99)
Potassium: 4.2 mmol/L (ref 3.5–5.2)
Sodium: 140 mmol/L (ref 134–144)

## 2019-04-08 LAB — IRON,TIBC AND FERRITIN PANEL
Ferritin: 56 ng/mL (ref 15–150)
Iron Saturation: 26 % (ref 15–55)
Iron: 93 ug/dL (ref 27–159)
Total Iron Binding Capacity: 359 ug/dL (ref 250–450)
UIBC: 266 ug/dL (ref 131–425)

## 2019-04-08 NOTE — Telephone Encounter (Signed)
PA completed on cover my meds through [employer].  BSW:[id number] Will wait to hear within 3 days

## 2019-04-16 NOTE — Telephone Encounter (Signed)
Resubmitted the PA for Aimovig again. [id number] Awaiting to hear response within 3 days,

## 2019-04-20 NOTE — Telephone Encounter (Signed)
Aimovig approved from 04/16/19-04/14/2020 through [employer] [place].  REF # [id number]

## 2019-05-25 DIAGNOSIS — Z0271 Encounter for disability determination: Secondary | ICD-10-CM

## 2019-05-28 ENCOUNTER — Emergency Department (HOSPITAL_COMMUNITY)
Admission: EM | Admit: 2019-05-28 | Discharge: 2019-05-29 | Disposition: A | Discharge status: Home / Self Care | Payer: BC Managed Care – PPO | Attending: Emergency Medicine | Admitting: Emergency Medicine

## 2019-05-28 ENCOUNTER — Encounter (HOSPITAL_COMMUNITY): Payer: Self-pay | Admitting: Radiology

## 2019-05-28 ENCOUNTER — Emergency Department (HOSPITAL_COMMUNITY): Payer: BC Managed Care – PPO

## 2019-05-28 ENCOUNTER — Other Ambulatory Visit: Payer: Self-pay

## 2019-05-28 ENCOUNTER — Other Ambulatory Visit (HOSPITAL_COMMUNITY): Payer: BC Managed Care – PPO

## 2019-05-28 DIAGNOSIS — R509 Fever, unspecified: Secondary | ICD-10-CM | POA: Diagnosis not present

## 2019-05-28 DIAGNOSIS — R1031 Right lower quadrant pain: Secondary | ICD-10-CM | POA: Insufficient documentation

## 2019-05-28 LAB — COMPREHENSIVE METABOLIC PANEL
ALT: 23 U/L (ref 0–44)
AST: 24 U/L (ref 15–41)
Albumin: 4.4 g/dL (ref 3.5–5.0)
Alkaline Phosphatase: 80 U/L (ref 38–126)
Anion gap: 11 (ref 5–15)
BUN: 10 mg/dL (ref 6–20)
CO2: 21 mmol/L — ABNORMAL LOW (ref 22–32)
Calcium: 9.3 mg/dL (ref 8.9–10.3)
Chloride: 106 mmol/L (ref 98–111)
Creatinine, Ser: 1 mg/dL (ref 0.44–1.00)
GFR calc Af Amer: >60 mL/min (ref >60)
GFR calc non Af Amer: >60 mL/min (ref >60)
Glucose, Bld: 100 mg/dL — ABNORMAL HIGH (ref 70–99)
Potassium: 3.6 mmol/L (ref 3.5–5.1)
Sodium: 138 mmol/L (ref 135–145)
Total Bilirubin: 0.3 mg/dL (ref 0.3–1.2)
Total Protein: 7.1 g/dL (ref 6.5–8.1)

## 2019-05-28 LAB — CBC
HCT: 40.6 % (ref 36.0–46.0)
Hemoglobin: 13.9 g/dL (ref 12.0–15.0)
MCH: 29.8 pg (ref 26.0–34.0)
MCHC: 34.2 g/dL (ref 30.0–36.0)
MCV: 87.1 fL (ref 80.0–100.0)
Platelets: 403 K/uL — ABNORMAL HIGH (ref 150–400)
RBC: 4.66 MIL/uL (ref 3.87–5.11)
RDW: 12 % (ref 11.5–15.5)
WBC: 15.9 K/uL — ABNORMAL HIGH (ref 4.0–10.5)
nRBC: 0 % (ref 0.0–0.2)

## 2019-05-28 LAB — URINALYSIS, MICROSCOPIC (REFLEX): Squamous Epithelial / HPF: >50 (ref 0–5)

## 2019-05-28 LAB — URINALYSIS, ROUTINE W REFLEX MICROSCOPIC
Bilirubin Urine: NEGATIVE
Glucose, UA: NEGATIVE mg/dL
Ketones, ur: 15 mg/dL — AB
Nitrite: NEGATIVE
Protein, ur: 30 mg/dL — AB
Specific Gravity, Urine: 1.015 (ref 1.005–1.030)
pH: 8.5 — ABNORMAL HIGH (ref 5.0–8.0)

## 2019-05-28 LAB — I-STAT BETA HCG BLOOD, ED (MC, WL, AP ONLY): I-stat hCG, quantitative: <5 m[IU]/mL (ref <5)

## 2019-05-28 LAB — LIPASE, BLOOD: Lipase: 22 U/L (ref 11–51)

## 2019-05-28 MED ORDER — LACTATED RINGERS IV BOLUS
1000.0000 mL | Freq: Once | INTRAVENOUS | Status: AC
  Administered 2019-05-28: 1000 mL via INTRAVENOUS

## 2019-05-28 MED ORDER — IOHEXOL 300 MG/ML  SOLN
100.0000 mL | Freq: Once | INTRAMUSCULAR | Status: AC | PRN
  Administered 2019-05-28: 100 mL via INTRAVENOUS

## 2019-05-28 MED ORDER — HYDROMORPHONE HCL 1 MG/ML IJ SOLN
0.5000 mg | Freq: Once | INTRAMUSCULAR | Status: AC
  Administered 2019-05-28: 0.5 mg via INTRAVENOUS
  Filled 2019-05-28: qty 1

## 2019-05-28 MED ORDER — SODIUM CHLORIDE 0.9% FLUSH: 3.0000 mL | Freq: Once | INTRAVENOUS | Status: DC

## 2019-05-28 MED ORDER — ONDANSETRON HCL 4 MG/2ML IJ SOLN
4.0000 mg | Freq: Once | INTRAMUSCULAR | Status: AC
  Administered 2019-05-28: 4 mg via INTRAVENOUS
  Filled 2019-05-28: qty 2

## 2019-05-28 NOTE — ED Provider Notes (Signed)
[facility] EMERGENCY DEPARTMENT Provider Note   CSN: [id number] Arrival date & time: 05/28/19  1801     History CC: Abd pain  Tracy Mcconnell is a 24 y.o. female.  The history is provided by the patient.  Abdominal Pain Pain location: lower. Pain quality: cramping and sharp   Pain radiates to:  Back Pain severity:  Moderate Onset quality:  Sudden Duration:  7 hours Timing:  Constant Progression:  Unchanged Chronicity:  New Context: not diet changes, not previous surgeries, not retching, not sick contacts and not trauma   Relieved by:  Nothing Worsened by:  Nothing Associated symptoms: chills, fever (subjective) and nausea   Associated symptoms: no constipation, no cough, no diarrhea, no dysuria, no hematemesis, no hematochezia, no hematuria, no shortness of breath, no vaginal bleeding, no vaginal discharge and no vomiting   Risk factors: has not had multiple surgeries and not pregnant        Past Medical History:  Diagnosis Date  . Anxiety   . Depression   . Hypovitaminosis D 04/07/2019  . Migraines     Patient Active Problem List   Diagnosis Date Noted  . Hypovitaminosis D 04/07/2019  . Migraines 10/28/2018  . Chronic daily headache 02/04/2017    Past Surgical History:  Procedure Laterality Date  . NO PAST SURGERIES       OB History   No obstetric history on file.     Family History  Problem Relation Age of Onset  . Migraines Mother   . Thyroid disease Mother   . Cancer Maternal Grandmother   . Diabetes Paternal Grandfather   . Hypertension Maternal Grandfather   . Diabetes Paternal Grandmother   . Stroke Paternal Grandmother     Social History   Tobacco Use  . Smoking status: Never Smoker  . Smokeless tobacco: Never Used  Substance Use Topics  . Alcohol use: No  . Drug use: No    Home Medications Prior to Admission medications   Medication Sig Start Date End Date Taking? Authorizing Provider  acetaminophen  (TYLENOL) 500 MG tablet Take 500 mg by mouth every 6 (six) hours as needed (pain).   Yes [provider]  buPROPion (WELLBUTRIN XL) 300 MG 24 hr tablet Take 300 mg by mouth daily. 05/28/19  Yes [provider]  busPIRone (BUSPAR) 30 MG tablet Take 30 mg by mouth 2 (two) times daily. 05/07/19  Yes [provider]  FLUoxetine (PROZAC) 40 MG capsule Take 40 mg by mouth daily. 05/07/19  Yes [provider]  gabapentin (NEURONTIN) 300 MG capsule TAKE 1 CAPSULE BY MOUTH THREE TIMES A DAY Patient taking differently: Take 300 mg by mouth 3 (three) times daily.  04/06/19  Yes Penumalli, Glenford Bayley, MD  hydrOXYzine (ATARAX/VISTARIL) 50 MG tablet Take 50 mg by mouth 3 (three) times daily as needed for anxiety.    Yes [provider]  LORazepam (ATIVAN) 0.5 MG tablet Take 0.5 mg by mouth every 8 (eight) hours as needed for anxiety.    Yes [provider]  rizatriptan (MAXALT-MLT) 10 MG disintegrating tablet Take 1 tablet (10 mg total) by mouth as needed for migraine. May repeat in 2 hours if needed 09/01/18  Yes Lomax, Amy, NP  doxycycline (VIBRAMYCIN) 100 MG capsule Take 1 capsule (100 mg total) by mouth 2 (two) times daily for 14 days. 05/29/19 06/12/19  Karlon Schlafer, [place], MD  Erenumab-aooe 140 MG/ML SOAJ Inject 140 mg into the skin every 30 (thirty) days. Patient not  taking: Reported on 05/28/2019 09/01/18   Debbora Presto, NP  ibuprofen (ADVIL,MOTRIN) 400 MG tablet Take 1 tablet (400 mg total) by mouth every 6 (six) hours as needed. Patient not taking: Reported on 05/28/2019 02/27/16   Domenic Moras, PA-C  meclizine (ANTIVERT) 25 MG tablet Take 1 tablet (25 mg total) by mouth 3 (three) times daily as needed for dizziness. Patient not taking: Reported on 05/28/2019 12/21/17   Drenda Freeze, MD  Vitamin D, Ergocalciferol, (DRISDOL) 1.25 MG (50000 UT) CAPS capsule Take 1 capsule (50,000 Units total) by mouth every 7 (seven) days. Patient not taking: Reported on 05/28/2019 04/07/19    Wendall Mola, NP  vitamin E (VITAMIN E) 400 UNIT capsule Take 1 capsule (400 Units total) by mouth daily. Start after weekly high dose vitamin D is completed. Patient not taking: Reported on 05/28/2019 04/20/19   Chase Picket, MD    Allergies    Sumatriptan  Review of Systems   Review of Systems  Constitutional: Positive for chills and fever (subjective).  Respiratory: Negative for cough and shortness of breath.   Gastrointestinal: Positive for abdominal pain and nausea. Negative for constipation, diarrhea, hematemesis, hematochezia and vomiting.  Genitourinary: Negative for dysuria, hematuria, vaginal bleeding and vaginal discharge.  All other systems reviewed and are negative.   Physical Exam Updated Vital Signs BP 124/83   Pulse 88   Temp 97.8 F (36.6 C)   Resp 16   Ht 5' (1.524 m)   Wt 61.2 kg   SpO2 98%   BMI 26.37 kg/m   Physical Exam Vitals and nursing note reviewed. Exam conducted with a chaperone present Molly Maduro).  Constitutional:      Appearance: She is well-developed. She is not toxic-appearing or diaphoretic.  HENT:     Head: Normocephalic and atraumatic.  Eyes:     Conjunctiva/sclera: Conjunctivae normal.  Cardiovascular:     Rate and Rhythm: Normal rate and regular rhythm.     Heart sounds: No murmur.  Pulmonary:     Effort: Pulmonary effort is normal. No respiratory distress.     Breath sounds: Normal breath sounds.  Abdominal:     Palpations: Abdomen is soft.     Tenderness: There is abdominal tenderness (RLQ). There is right CVA tenderness. There is no left CVA tenderness, guarding or rebound.  Genitourinary:    Vagina: Vaginal discharge (white) present. No bleeding.     Cervix: Cervical motion tenderness and discharge present. No cervical bleeding.     Adnexa:        Right: Tenderness present. No mass or fullness.         Left: No mass, tenderness or fullness.    Musculoskeletal:     Cervical back: Neck supple.     Right lower  leg: No edema.     Left lower leg: No edema.  Skin:    General: Skin is warm and dry.  Neurological:     General: No focal deficit present.     Mental Status: She is alert and oriented to person, place, and time.  Psychiatric:        Mood and Affect: Mood normal.        Behavior: Behavior normal.     ED Results / Procedures / Treatments   Labs (all labs ordered are listed, but only abnormal results are displayed) Labs Reviewed  WET PREP, GENITAL - Abnormal; Notable for the following components:      Result Value   Yeast Wet Prep  HPF POC PRESENT (*)    WBC, Wet Prep HPF POC MODERATE (*)    All other components within normal limits  COMPREHENSIVE METABOLIC PANEL - Abnormal; Notable for the following components:   CO2 21 (*)    Glucose, Bld 100 (*)    All other components within normal limits  CBC - Abnormal; Notable for the following components:   WBC 15.9 (*)    Platelets 403 (*)    All other components within normal limits  URINALYSIS, ROUTINE W REFLEX MICROSCOPIC - Abnormal; Notable for the following components:   APPearance CLOUDY (*)    pH 8.5 (*)    Hgb urine dipstick SMALL (*)    Ketones, ur 15 (*)    Protein, ur 30 (*)    Leukocytes,Ua MODERATE (*)    All other components within normal limits  URINALYSIS, MICROSCOPIC (REFLEX) - Abnormal; Notable for the following components:   Bacteria, UA MANY (*)    All other components within normal limits  LIPASE, BLOOD  I-STAT BETA HCG BLOOD, ED ([facility], [facility], [facility] ONLY)  GC/CHLAMYDIA PROBE AMP ([place]) NOT AT [facility]  WET PREP  (BD AFFIRM) ([place])    EKG None  Radiology CT ABDOMEN PELVIS W CONTRAST  Result Date: 05/28/2019 CLINICAL DATA:  Right lower quadrant pain EXAM: CT ABDOMEN AND PELVIS WITH CONTRAST TECHNIQUE: Multidetector CT imaging of the abdomen and pelvis was performed using the standard protocol following bolus administration of intravenous contrast. CONTRAST:  OMNIPAQUE IOHEXOL 300 MG/ML  SOLN  COMPARISON:  None. FINDINGS: Lower chest: No acute abnormality. Hepatobiliary: No focal liver abnormality is seen. No gallstones, gallbladder wall thickening, or biliary dilatation. Pancreas: Unremarkable. No pancreatic ductal dilatation or surrounding inflammatory changes. Spleen: Normal in size without focal abnormality. Adrenals/Urinary Tract: Adrenal glands are unremarkable. Kidneys are normal, without renal calculi, focal lesion, or hydronephrosis. Bladder is unremarkable. Stomach/Bowel: Stomach is within normal limits. Appendix appears normal. No evidence of bowel wall thickening, distention, or inflammatory changes. Vascular/Lymphatic: No significant vascular findings are present. No enlarged abdominal or pelvic lymph nodes. Reproductive: Uterus unremarkable.  No adnexal mass. Other: Trace fluid at the right adnexa.  No free air Musculoskeletal: No acute or significant osseous findings. IMPRESSION: 1. No definite CT evidence for acute intra-abdominal or pelvic abnormality. Negative for acute appendicitis. 2. Trace fluid in the right adnexa Electronically Signed   By: Jasmine Pang M.D.   On: 05/28/2019 22:48   US PELVIC COMPLETE W TRANSVAGINAL AND TORSION R/O  Result Date: 05/29/2019 CLINICAL DATA:  Right lower quadrant pain for 1 day EXAM: TRANSABDOMINAL AND TRANSVAGINAL ULTRASOUND OF PELVIS DOPPLER ULTRASOUND OF OVARIES TECHNIQUE: Both transabdominal and transvaginal ultrasound examinations of the pelvis were performed. Transabdominal technique was performed for global imaging of the pelvis including uterus, ovaries, adnexal regions, and pelvic cul-de-sac. It was necessary to proceed with endovaginal exam following the transabdominal exam to visualize the adnexal structures. Color and duplex Doppler ultrasound was utilized to evaluate blood flow to the ovaries. COMPARISON:  05/28/2019 FINDINGS: Uterus Measurements: 7.0 x 2.9 x 3.9 cm = volume: 41.7 mL. No fibroids or other mass visualized. Endometrium  Thickness: 7 mm.  No focal abnormality visualized. Right ovary Measurements: 3.5 x 2.1 x 2.3 cm = volume: 9 mL. Normal appearance/no adnexal mass. Left ovary Measurements: 2.3 x 2.9 x 1.5 cm = volume: 5.6 mL. Normal appearance/no adnexal mass. Pulsed Doppler evaluation of both ovaries demonstrates normal low-resistance arterial and venous waveforms. Other findings Trace free fluid in the cul-de-sac.  IMPRESSION: 1. Trace pelvic free fluid likely physiologic. 2. Age-appropriate pelvic ultrasound. Electronically Signed   By: Sharlet Salina M.D.   On: 05/29/2019 01:01    Procedures Procedures (including critical care time)  Medications Ordered in ED Medications  lactated ringers bolus 1,000 mL (0 mLs Intravenous Stopped 05/29/19 0021)  HYDROmorphone (DILAUDID) injection 0.5 mg (0.5 mg Intravenous Given 05/28/19 2220)  ondansetron (ZOFRAN) injection 4 mg (4 mg Intravenous Given 05/28/19 2220)  iohexol (OMNIPAQUE) 300 MG/ML solution 100 mL (100 mLs Intravenous Contrast Given 05/28/19 2228)  oxyCODONE-acetaminophen (PERCOCET/ROXICET) 5-325 MG per tablet 1 tablet (1 tablet Oral Given 05/29/19 0021)  cefTRIAXone (ROCEPHIN) injection 500 mg (500 mg Intramuscular Given 05/29/19 0149)    ED Course  I have reviewed the triage vital signs and the nursing notes.  Pertinent labs & imaging results that were available during my care of the patient were reviewed by me and considered in my medical decision making (see chart for details).    MDM Rules/Calculators/A&P                      Here for lower abdominal pain.  Has right lower quadrant tenderness on exam.  CTA performed to rule out appendicitis and was negative for appendicitis but did show some right adnexal fluid.  Patient had mild cervical motion tenderness on my exam, right adnexal tenderness, ultrasound performed to rule out tubo-ovarian abscess or torsion and was negative for both.  Patient will be treated empirically for PID with Rocephin and  doxycycline.  Discussed treatment plan with the patient and she is in agreement, strict return precaution provided, patient feeling much better after treatment and was discharged home in stable condition.   Final Clinical Impression(s) / ED Diagnoses Final diagnoses:  RLQ abdominal pain    Rx / DC Orders ED Discharge Orders         Ordered    doxycycline (VIBRAMYCIN) 100 MG capsule  2 times daily     05/29/19 0134           Ahmani Daoud, [place], MD 05/29/19 1435    Derwood Kaplan, MD 06/02/19 1918

## 2019-05-28 NOTE — ED Triage Notes (Signed)
Pt in POV. Reports lower abd pain, nausea since this AM. Pt in NAD.

## 2019-05-29 ENCOUNTER — Emergency Department (HOSPITAL_COMMUNITY): Payer: BC Managed Care – PPO

## 2019-05-29 DIAGNOSIS — R1031 Right lower quadrant pain: Secondary | ICD-10-CM | POA: Diagnosis not present

## 2019-05-29 LAB — WET PREP, GENITAL
Clue Cells Wet Prep HPF POC: NONE SEEN
Sperm: NONE SEEN
Trich, Wet Prep: NONE SEEN

## 2019-05-29 MED ORDER — OXYCODONE-ACETAMINOPHEN 5-325 MG PO TABS
1.0000 | ORAL_TABLET | Freq: Once | ORAL | Status: AC
  Administered 2019-05-29: 1 via ORAL
  Filled 2019-05-29: qty 1

## 2019-05-29 MED ORDER — DOXYCYCLINE HYCLATE 100 MG PO CAPS: 100.0000 mg | ORAL_CAPSULE | Freq: Two times a day (BID) | ORAL | 0 refills | Status: DC

## 2019-05-29 MED ORDER — CEFTRIAXONE SODIUM 500 MG IJ SOLR
500.0000 mg | Freq: Once | INTRAMUSCULAR | Status: AC
  Administered 2019-05-29: 500 mg via INTRAMUSCULAR
  Filled 2019-05-29: qty 500

## 2019-05-30 LAB — GC/CHLAMYDIA PROBE AMP (~~LOC~~) NOT AT ARMC
Chlamydia: NEGATIVE
Neisseria Gonorrhea: NEGATIVE

## 2019-06-03 ENCOUNTER — Other Ambulatory Visit: Payer: Self-pay

## 2019-06-03 ENCOUNTER — Ambulatory Visit (HOSPITAL_COMMUNITY)
Admission: EM | Admit: 2019-06-03 | Discharge: 2019-06-03 | Disposition: A | Discharge status: Home / Self Care | Payer: Medicaid Other | Source: Home / Self Care

## 2019-06-03 ENCOUNTER — Telehealth: Payer: Self-pay

## 2019-06-03 ENCOUNTER — Encounter (HOSPITAL_COMMUNITY): Payer: Self-pay | Admitting: Emergency Medicine

## 2019-06-03 ENCOUNTER — Emergency Department (HOSPITAL_COMMUNITY)
Admission: EM | Admit: 2019-06-03 | Discharge: 2019-06-03 | Disposition: A | Discharge status: Home / Self Care | Payer: Medicaid Other | Attending: Emergency Medicine | Admitting: Emergency Medicine

## 2019-06-03 DIAGNOSIS — H539 Unspecified visual disturbance: Secondary | ICD-10-CM

## 2019-06-03 DIAGNOSIS — G43909 Migraine, unspecified, not intractable, without status migrainosus: Secondary | ICD-10-CM | POA: Diagnosis not present

## 2019-06-03 DIAGNOSIS — G43B0 Ophthalmoplegic migraine, not intractable: Secondary | ICD-10-CM | POA: Diagnosis not present

## 2019-06-03 DIAGNOSIS — Z79899 Other long term (current) drug therapy: Secondary | ICD-10-CM | POA: Insufficient documentation

## 2019-06-03 DIAGNOSIS — H538 Other visual disturbances: Secondary | ICD-10-CM | POA: Diagnosis not present

## 2019-06-03 LAB — BASIC METABOLIC PANEL
Anion gap: 10 (ref 5–15)
BUN: 9 mg/dL (ref 6–20)
CO2: 21 mmol/L — ABNORMAL LOW (ref 22–32)
Calcium: 9.3 mg/dL (ref 8.9–10.3)
Chloride: 106 mmol/L (ref 98–111)
Creatinine, Ser: 0.8 mg/dL (ref 0.44–1.00)
GFR calc Af Amer: >60 mL/min (ref >60)
GFR calc non Af Amer: >60 mL/min (ref >60)
Glucose, Bld: 90 mg/dL (ref 70–99)
Potassium: 3.9 mmol/L (ref 3.5–5.1)
Sodium: 137 mmol/L (ref 135–145)

## 2019-06-03 LAB — CBC
HCT: 41.9 % (ref 36.0–46.0)
Hemoglobin: 14.2 g/dL (ref 12.0–15.0)
MCH: 29.6 pg (ref 26.0–34.0)
MCHC: 33.9 g/dL (ref 30.0–36.0)
MCV: 87.5 fL (ref 80.0–100.0)
Platelets: 402 10*3/uL — ABNORMAL HIGH (ref 150–400)
RBC: 4.79 MIL/uL (ref 3.87–5.11)
RDW: 12 % (ref 11.5–15.5)
WBC: 9.5 10*3/uL (ref 4.0–10.5)
nRBC: 0 % (ref 0.0–0.2)

## 2019-06-03 LAB — CBG MONITORING, ED: Glucose-Capillary: 86 mg/dL (ref 70–99)

## 2019-06-03 LAB — I-STAT BETA HCG BLOOD, ED (MC, WL, AP ONLY): I-stat hCG, quantitative: <5 m[IU]/mL (ref <5)

## 2019-06-03 MED ORDER — SODIUM CHLORIDE 0.9% FLUSH: 3.0000 mL | Freq: Once | INTRAVENOUS | Status: DC

## 2019-06-03 MED ORDER — DIPHENHYDRAMINE HCL 50 MG/ML IJ SOLN
25.0000 mg | Freq: Once | INTRAMUSCULAR | Status: AC
  Administered 2019-06-03: 25 mg via INTRAVENOUS
  Filled 2019-06-03: qty 1

## 2019-06-03 MED ORDER — METOCLOPRAMIDE HCL 5 MG/ML IJ SOLN
10.0000 mg | Freq: Once | INTRAMUSCULAR | Status: AC
  Administered 2019-06-03: 10 mg via INTRAVENOUS
  Filled 2019-06-03: qty 2

## 2019-06-03 NOTE — Telephone Encounter (Signed)
Can you please call patient and let her know that Botox will not approve procedure while she is taking Amovig. We would have to discontinue Amovig to proceed with Botox. Does she wish to d/c Amovig?

## 2019-06-03 NOTE — Telephone Encounter (Signed)
Sent clinicals with updated information via fax. DWD

## 2019-06-03 NOTE — ED Notes (Signed)
Patient is being discharged from the Urgent Care Center and sent to the Emergency Department via wheelchair by staff. Per dr hagler, patient is stable but in need of higher level of care due to visual changes, vision loss. Patient is aware and verbalizes understanding of plan of care.  Vitals:   06/03/19 1329  BP: 119/86  Pulse: 93  Resp: 18  Temp: 98.3 F (36.8 C)  SpO2: 100%

## 2019-06-03 NOTE — ED Triage Notes (Addendum)
Pt arrives to ED from UC with complaints of blurred vision and vision changes in her left eye since Monday 3/1. Patient states that when she woke up on Monday her vision in her left eye was blurry and seemed liked there was always a "shadow" that she saw up close.. Patient describes her vision as "fuzzy" and her left eye is painful.

## 2019-06-03 NOTE — Telephone Encounter (Signed)
I spoke to pt and she would like to proceed with BOTOX,  Her last aimovig injection she stated was 2 months ago.

## 2019-06-03 NOTE — Telephone Encounter (Signed)
It appears this patient is currently on Aimovig, she cannot be on Aimovig and Botox at the same time, please advise how to proceed.

## 2019-06-03 NOTE — ED Provider Notes (Signed)
[facility] EMERGENCY DEPARTMENT Provider Note   CSN: [id number] Arrival date & time: 06/03/19  1415     History Chief Complaint  Patient presents with  . Blurred Vision    Tracy Mcconnell is a 24 y.o. female.  Patient with history of chronic migraines, on gabapentin and rizatriptan, followed by [place] neuro -- presents to the emergency department tonight with complaint of blurry vision in her left eye.  She was seen previously at Urgent Care and sent to the ED.  Symptoms started 2 mornings ago with blurry vision in the left eye.  Yesterday of it she developed some pain that is worse with movement of the eye.  Blurry vision persisted.  She denies any swelling or redness around the eye.  No history of trauma or history of autoimmune diseases.  She does have some photophobia.  Today she developed a "gray film" over the entire visual field.  Also states that her left eyelid is drooping.  No complete loss of vision.  Patient's typical migraines include occasional aura with numbness and tingling in her extremities.  She does not routinely get visual field symptoms.  She has a mild generalized headache in addition to the pain in the left eye area.  No fevers or URI symptoms.        Past Medical History:  Diagnosis Date  . Anxiety   . Depression   . Hypovitaminosis D 04/07/2019  . Migraines     Patient Active Problem List   Diagnosis Date Noted  . Hypovitaminosis D 04/07/2019  . Migraines 10/28/2018  . Chronic daily headache 02/04/2017    Past Surgical History:  Procedure Laterality Date  . NO PAST SURGERIES       OB History   No obstetric history on file.     Family History  Problem Relation Age of Onset  . Migraines Mother   . Thyroid disease Mother   . Cancer Maternal Grandmother   . Diabetes Paternal Grandfather   . Hypertension Maternal Grandfather   . Diabetes Paternal Grandmother   . Stroke Paternal Grandmother     Social History    Tobacco Use  . Smoking status: Never Smoker  . Smokeless tobacco: Never Used  Substance Use Topics  . Alcohol use: No  . Drug use: No    Home Medications Prior to Admission medications   Medication Sig Start Date End Date Taking? Authorizing Provider  acetaminophen (TYLENOL) 500 MG tablet Take 500 mg by mouth every 6 (six) hours as needed (pain).    [provider]  buPROPion (WELLBUTRIN XL) 300 MG 24 hr tablet Take 300 mg by mouth daily. 05/28/19   [provider]  busPIRone (BUSPAR) 30 MG tablet Take 30 mg by mouth 2 (two) times daily. 05/07/19   [provider]  doxycycline (VIBRAMYCIN) 100 MG capsule Take 1 capsule (100 mg total) by mouth 2 (two) times daily for 14 days. 05/29/19 06/12/19  Hunter, [place], MD  Erenumab-aooe 140 MG/ML SOAJ Inject 140 mg into the skin every 30 (thirty) days. Patient not taking: Reported on 05/28/2019 09/01/18   Lomax, Amy, NP  FLUoxetine (PROZAC) 40 MG capsule Take 40 mg by mouth daily. 05/07/19   [provider]  gabapentin (NEURONTIN) 300 MG capsule TAKE 1 CAPSULE BY MOUTH THREE TIMES A DAY Patient taking differently: Take 300 mg by mouth 3 (three) times daily.  04/06/19   Penumalli, Glenford Bayley, MD  hydrOXYzine (ATARAX/VISTARIL) 50 MG tablet Take 50 mg by mouth  3 (three) times daily as needed for anxiety.     [provider]  ibuprofen (ADVIL,MOTRIN) 400 MG tablet Take 1 tablet (400 mg total) by mouth every 6 (six) hours as needed. Patient not taking: Reported on 05/28/2019 02/27/16   Fayrene Helper, PA-C  LORazepam (ATIVAN) 0.5 MG tablet Take 0.5 mg by mouth every 8 (eight) hours as needed for anxiety.     [provider]  meclizine (ANTIVERT) 25 MG tablet Take 1 tablet (25 mg total) by mouth 3 (three) times daily as needed for dizziness. Patient not taking: Reported on 05/28/2019 12/21/17   Charlynne Pander, MD  rizatriptan (MAXALT-MLT) 10 MG disintegrating tablet Take 1 tablet (10 mg total) by mouth as needed  for migraine. May repeat in 2 hours if needed 09/01/18   Lomax, Amy, NP  Vitamin D, Ergocalciferol, (DRISDOL) 1.25 MG (50000 UT) CAPS capsule Take 1 capsule (50,000 Units total) by mouth every 7 (seven) days. Patient not taking: Reported on 05/28/2019 04/07/19   Royal Hawthorn, NP  vitamin E (VITAMIN E) 400 UNIT capsule Take 1 capsule (400 Units total) by mouth daily. Start after weekly high dose vitamin D is completed. Patient not taking: Reported on 05/28/2019 04/20/19   Merrilee Jansky, MD    Allergies    Sumatriptan  Review of Systems   Review of Systems  Constitutional: Negative for fever.  HENT: Negative for congestion, dental problem, rhinorrhea and sinus pressure.   Eyes: Positive for photophobia and visual disturbance. Negative for discharge and redness.  Respiratory: Negative for shortness of breath.   Cardiovascular: Negative for chest pain.  Gastrointestinal: Negative for nausea and vomiting.  Musculoskeletal: Negative for gait problem, neck pain and neck stiffness.  Skin: Negative for rash.  Neurological: Positive for headaches. Negative for syncope, speech difficulty, weakness, light-headedness and numbness.  Psychiatric/Behavioral: Negative for confusion.    Physical Exam Updated Vital Signs BP 106/76   Pulse 74   Temp 98.1 F (36.7 C) (Oral)   Resp 16   Ht 5' (1.524 m)   Wt 61.2 kg   SpO2 100%   BMI 26.37 kg/m   Physical Exam Vitals and nursing note reviewed.  Constitutional:      Appearance: She is well-developed.  HENT:     Head: Normocephalic and atraumatic.     Right Ear: Tympanic membrane, ear canal and external ear normal.     Left Ear: Tympanic membrane, ear canal and external ear normal.     Nose: Nose normal.     Mouth/Throat:     Pharynx: Uvula midline.  Eyes:     General: Lids are normal. No visual field deficit.    Extraocular Movements: Extraocular movements intact.     Right eye: No nystagmus.     Left eye: No nystagmus.      Conjunctiva/sclera: Conjunctivae normal.     Pupils: Pupils are equal, round, and reactive to light. Pupils are equal.     Funduscopic exam:    Right eye: No hemorrhage or exudate.        Left eye: No hemorrhage or exudate.     Slit lamp exam:    Right eye: No corneal flare.     Left eye: No corneal flare.     Comments: Pupils symmetric and normal.  Anterior chamber appears clear.  Sclerae clear.  No obvious retinal abnormalities on funduscopic exam.  Extraocular muscle movements are intact without apparent discomfort.  There is no swelling or erythema around the eye  or any signs of proptosis.  Cardiovascular:     Rate and Rhythm: Normal rate and regular rhythm.  Pulmonary:     Effort: Pulmonary effort is normal.     Breath sounds: Normal breath sounds.  Abdominal:     Palpations: Abdomen is soft.     Tenderness: There is no abdominal tenderness.  Musculoskeletal:     Cervical back: Normal range of motion and neck supple. No tenderness or bony tenderness.  Skin:    General: Skin is warm and dry.  Neurological:     Mental Status: She is alert and oriented to person, place, and time.     GCS: GCS eye subscore is 4. GCS verbal subscore is 5. GCS motor subscore is 6.     Cranial Nerves: Cranial nerves are intact. No cranial nerve deficit, dysarthria or facial asymmetry.     Sensory: No sensory deficit.     Motor: Motor function is intact.     Coordination: Coordination normal.     Gait: Gait normal.     Deep Tendon Reflexes: Reflexes are normal and symmetric.     Comments: I do not see any ptosis at time of my exam.      ED Results / Procedures / Treatments   Labs (all labs ordered are listed, but only abnormal results are displayed) Labs Reviewed  BASIC METABOLIC PANEL - Abnormal; Notable for the following components:      Result Value   CO2 21 (*)    All other components within normal limits  CBC - Abnormal; Notable for the following components:   Platelets 402 (*)    All  other components within normal limits  CBG MONITORING, ED  I-STAT BETA HCG BLOOD, ED ([facility], [facility], [facility] ONLY)    EKG None  Radiology No results found.  Procedures Procedures (including critical care time)  Medications Ordered in ED Medications  sodium chloride flush (NS) 0.9 % injection 3 mL (3 mLs Intravenous Not Given 06/03/19 2227)  metoCLOPramide (REGLAN) injection 10 mg (10 mg Intravenous Given 06/03/19 2221)  diphenhydrAMINE (BENADRYL) injection 25 mg (25 mg Intravenous Given 06/03/19 2221)    ED Course  I have reviewed the triage vital signs and the nursing notes.  Pertinent labs & imaging results that were available during my care of the patient were reviewed by me and considered in my medical decision making (see chart for details).  Patient seen and examined.  Labs ordered by nursing protocol on arrival reviewed.  We will treat patient for migraine headache.  Will perform visual acuity and ultrasound eye.   Vital signs reviewed and are as follows: BP 106/76   Pulse 74   Temp 98.1 F (36.7 C) (Oral)   Resp 16   Ht 5' (1.524 m)   Wt 61.2 kg   SpO2 100%   BMI 26.37 kg/m   EMERGENCY DEPARTMENT [place] OCULAR EXAM "Study: Limited Ultrasound of Orbit "  INDICATIONS: Vision loss/change Linear probe utilized to obtain images in both long and short axis of the orbit having the patient look left and right if possible.  PERFORMED BY: Myself IMAGES ARCHIVED?: Yes LIMITATIONS: none VIEWS USED: Left orbit INTERPRETATION: No retinal detachment, Lens in proper position, no hemorrhage noted   11:34 PM patient rechecked after Reglan and Benadryl.  States that vision is unchanged however headache is improved.  She is ready to go home.  She has been in the emergency department for over 8 hours.    Visual Acuity  Right Eye Distance:   Left Eye Distance:   Bilateral Distance:    Right Eye Near: R Near: 10/12.5 Left Eye Near:  L Near: 10/40 Bilateral Near:  10/52  Patient will be  given referral for Dr. Wynelle Link, ophthalmology, and encouraged to call first thing in the morning for follow-up appointment.  She seems willing to do this.  She has PCP follow-up on 3/5 and upcoming Botox treatment for her headaches which will be her first.  Patient encouraged to return to the emergency department with worsening or changing symptoms including loss of vision, weakness, numbness, or tingling in her arms or legs.  Patient verbalizes understanding agrees with plan.    MDM Rules/Calculators/A&P                      Patient with history of migraine headache presenting with visual changes in her left eye as well as a drooping eyelid.  Exam here is reassuring.  No evidence of retinal detachment or vitreous hemorrhage on ultrasound.  Patient with decreased visual acuity in left eye, however is able to perform visual acuity.  EOMI. patient is not have any focal neurological deficit.  I doubt the patient has had a stroke.  She does not have complete loss of vision or visual field cut.  Considered retinal artery and retinal vein occlusion.  On my limited exam, I do not see any findings concerning for this and again patient does not have any full vision loss.  I suspect that the patient's visual symptoms are related to migraines, however I would like her to follow-up with ophthalmology to have a complete exam.  Patient is already established with a neurologist and has upcoming PCP follow-up.  We discussed strict return and follow-up instructions.  Patient seems reliable to follow-up.    Final Clinical Impression(s) / ED Diagnoses Final diagnoses:  Ophthalmoplegic migraine, not intractable  Visual changes    Rx / DC Orders ED Discharge Orders    None       Renne Crigler, PA-C 06/03/19 2342    Geoffery Lyons, MD 06/04/19 [phone]

## 2019-06-03 NOTE — ED Triage Notes (Signed)
Patient reports noticing vision changes in left eye for 2 days.  This involves left eye.  No known injury, no recent injury.  Patient keeps referencing a fuzzy, hazy vision, "persistant shadow".  Trying to see with left eye: images look like a filter involved in vision"  Cannot read print that she usually can see.    Dr hagler spoke to patient in in-take

## 2019-06-03 NOTE — Discharge Instructions (Signed)
Please read and follow all provided instructions.  Your diagnoses today include:  1. Ophthalmoplegic migraine, not intractable   2. Visual changes     Tests performed today include:  Blood counts and electrolytes  Vital signs. See below for your results today.   Medications:  In the Emergency Department you received:  Reglan - antinausea/headache medication  Benadryl - antihistamine to counteract potential side effects of reglan  Take any prescribed medications only as directed.  Additional information:  Follow any educational materials contained in this packet.  You are having a headache. No specific cause was found today for your headache. It may have been a migraine or other cause of headache. Stress, anxiety, fatigue, and depression are common triggers for headaches.   Your headache today does not appear to be life-threatening or require hospitalization, but often the exact cause of headaches is not determined in the emergency department. Therefore, follow-up with your doctor is very important to find out what may have caused your headache and whether or not you need any further diagnostic testing or treatment.   Sometimes headaches can appear benign (not harmful), but then more serious symptoms can develop which should prompt an immediate re-evaluation by your doctor or the emergency department.  BE VERY CAREFUL not to take multiple medicines containing Tylenol (also called acetaminophen). Doing so can lead to an overdose which can damage your liver and cause liver failure and possibly death.   Follow-up instructions: Please call the ophthalmologist (eye specialist) first thing tomorrow morning.  Tell them you were seen in the emergency department and asked to follow-up for evaluation of your blurry vision.  Return instructions:   Please return to the Emergency Department if you experience worsening symptoms.  Return if the medications do not resolve your headache, if it  recurs, or if you have multiple episodes of vomiting or cannot keep down fluids.  Return if you have a change from the usual headache.  RETURN IMMEDIATELY IF you:  Develop a sudden, severe headache  Develop confusion or become poorly responsive or faint  Develop a fever above 100.42F or problem breathing  Have a change in speech, vision, swallowing, or understanding  Develop new weakness, numbness, tingling, incoordination in your arms or legs  Have a seizure  Please return if you have any other emergent concerns.  Additional Information:  Your vital signs today were: BP 106/76   Pulse 74   Temp 98.1 F (36.7 C) (Oral)   Resp 16   Ht 5' (1.524 m)   Wt 61.2 kg   SpO2 100%   BMI 26.37 kg/m  If your blood pressure (BP) was elevated above 135/85 this visit, please have this repeated by your doctor within one month. --------------

## 2019-06-03 NOTE — ED Notes (Signed)
Bed: UCTR Expected date:  Expected time:  Means of arrival:  Comments: Use 1-8 rooms for well please

## 2019-06-04 DIAGNOSIS — H5213 Myopia, bilateral: Secondary | ICD-10-CM | POA: Diagnosis not present

## 2019-06-05 ENCOUNTER — Other Ambulatory Visit: Payer: Self-pay

## 2019-06-05 ENCOUNTER — Ambulatory Visit (INDEPENDENT_AMBULATORY_CARE_PROVIDER_SITE_OTHER): Payer: Medicaid Other | Admitting: Adult Health Nurse Practitioner

## 2019-06-05 ENCOUNTER — Other Ambulatory Visit (HOSPITAL_COMMUNITY)
Admission: RE | Admit: 2019-06-05 | Discharge: 2019-06-05 | Disposition: A | Discharge status: Home / Self Care | Payer: Medicaid Other | Source: Ambulatory Visit | Attending: Adult Health Nurse Practitioner | Admitting: Adult Health Nurse Practitioner

## 2019-06-05 VITALS — BP 128/77 | HR 88 | Temp 97.9°F | Ht 60.0 in | Wt 136.0 lb

## 2019-06-05 DIAGNOSIS — Z0001 Encounter for general adult medical examination with abnormal findings: Secondary | ICD-10-CM

## 2019-06-05 DIAGNOSIS — H579 Unspecified disorder of eye and adnexa: Secondary | ICD-10-CM

## 2019-06-05 DIAGNOSIS — Z124 Encounter for screening for malignant neoplasm of cervix: Secondary | ICD-10-CM

## 2019-06-05 NOTE — Patient Instructions (Signed)
° ° ° °  If you have lab work done today you will be contacted with your lab results within the next 2 weeks.  If you have not heard from us then please contact us. The fastest way to get your results is to register for My Chart. ° ° °IF you received an x-ray today, you will receive an invoice from [facility]. Please contact [facility] at [phone] with questions or concerns regarding your invoice.  ° °IF you received labwork today, you will receive an invoice from [facility]. Please contact [facility] at [phone] with questions or concerns regarding your invoice.  ° °Our billing staff will not be able to assist you with questions regarding bills from these companies. ° °You will be contacted with the lab results as soon as they are available. The fastest way to get your results is to activate your My Chart account. Instructions are located on the last page of this paperwork. If you have not heard from us regarding the results in 2 weeks, please contact this office. °  ° ° ° °

## 2019-06-08 DIAGNOSIS — H579 Unspecified disorder of eye and adnexa: Secondary | ICD-10-CM

## 2019-06-08 DIAGNOSIS — Z124 Encounter for screening for malignant neoplasm of cervix: Secondary | ICD-10-CM | POA: Insufficient documentation

## 2019-06-08 HISTORY — DX: Unspecified disorder of eye and adnexa: H57.9

## 2019-06-08 NOTE — Progress Notes (Signed)
Wellness Office Visit  Subjective:  Patient ID: Tracy Mcconnell, female    DOB: [birth year]  Age: 24 y.o. MRN: [id number]  CC:  Chief Complaint  Patient presents with  . Follow-up    x2 mos medical conditions/ Visual changes in Lt eye associated with pain    HPI Tracy Mcconnell presents for PAP and physical.  She has a hx of migraines and over the weekend went to the ER with pain to her left eye.  States it hurts to move her eye on the left with increased pain. No current migraine.    Past Medical History:  Diagnosis Date  . Anxiety   . Depression   . Hypovitaminosis D 04/07/2019  . Migraines     Past Surgical History:  Procedure Laterality Date  . NO PAST SURGERIES      Family History  Problem Relation Age of Onset  . Migraines Mother   . Thyroid disease Mother   . Cancer Maternal Grandmother   . Diabetes Paternal Grandfather   . Hypertension Maternal Grandfather   . Diabetes Paternal Grandmother   . Stroke Paternal Grandmother     Social History   Socioeconomic History  . Marital status: Single    Spouse name: Not on file  . Number of children: 0  . Years of education: college student  . Highest education level: Not on file  Occupational History  . Not on file  Tobacco Use  . Smoking status: Never Smoker  . Smokeless tobacco: Never Used  Substance and Sexual Activity  . Alcohol use: No  . Drug use: No  . Sexual activity: Not Currently  Other Topics Concern  . Not on file  Social History Narrative   Lives at home alone   Right handed   Does not drink caffeine, makes her nauseated   Social Determinants of Health   Financial Resource Strain:   . Difficulty of Paying Living Expenses: Not on file  Food Insecurity:   . Worried About Charity fundraiser in the Last Year: Not on file  . Ran Out of Food in the Last Year: Not on file  Transportation Needs:   . Lack of Transportation (Medical): Not on file  . Lack of Transportation  (Non-Medical): Not on file  Physical Activity:   . Days of Exercise per Week: Not on file  . Minutes of Exercise per Session: Not on file  Stress:   . Feeling of Stress : Not on file  Social Connections:   . Frequency of Communication with Friends and Family: Not on file  . Frequency of Social Gatherings with Friends and Family: Not on file  . Attends Religious Services: Not on file  . Active Member of Clubs or Organizations: Not on file  . Attends Archivist Meetings: Not on file  . Marital Status: Not on file  Intimate Partner Violence:   . Fear of Current or Ex-Partner: Not on file  . Emotionally Abused: Not on file  . Physically Abused: Not on file  . Sexually Abused: Not on file    ROS Review of Systems   Review of Systems  Constitutional: Negative for activity change, appetite change, chills and fever.  HENT: Negative for congestion, nosebleeds, trouble swallowing and voice change.   Respiratory: Negative for cough, shortness of breath and wheezing.   Gastrointestinal: Negative for diarrhea, nausea and vomiting.  Genitourinary: Negative for difficulty urinating, dysuria, flank pain and hematuria.  Musculoskeletal: Negative for back pain, joint swelling  and neck pain.  Neurological: Negative for dizziness, speech difficulty, light-headedness and numbness. Positive for pain with EOM. See HPI. All other review of systems negative.    Objective:   Today's Vitals: BP 128/77 (BP Location: Right Arm, Patient Position: Sitting, Cuff Size: Normal)   Pulse 88   Temp 97.9 F (36.6 C) (Temporal)   Ht 5' (1.524 m)   Wt 136 lb (61.7 kg)   LMP 05/27/2019   SpO2 98%   BMI 26.56 kg/m   Physical Exam  General appearance: alert, well appearing, and in no distress, oriented to person, place, and time and anxious. Eyes:  Eye exam - pupils equal and reactive, extraocular eye movements intact. Chest: clear to auscultation, no wheezes, rales or rhonchi, symmetric air entry.   CVS exam: normal rate, regular rhythm, normal S1, S2, no murmurs, rubs, clicks or gallops. Skin exam - normal coloration and turgor, no rashes, no suspicious skin lesions noted. Mental Status: normal mood, behavior, speech, dress, motor activity, and thought processes, anxious.    Assessment & Plan:   Problem List Items Addressed This Visit    None    Visit Diagnoses    Screening for cervical cancer    -  Primary   Relevant Orders   Cytology - PAP([place])      Outpatient Encounter Medications as of 06/05/2019  Medication Sig  . acetaminophen (TYLENOL) 500 MG tablet Take 500 mg by mouth every 6 (six) hours as needed (pain).  [name] buPROPion (WELLBUTRIN XL) 300 MG 24 hr tablet Take 300 mg by mouth daily.  . busPIRone (BUSPAR) 30 MG tablet Take 30 mg by mouth 2 (two) times daily.  [name] doxycycline (VIBRAMYCIN) 100 MG capsule Take 1 capsule (100 mg total) by mouth 2 (two) times daily for 14 days.  [name] FLUoxetine (PROZAC) 40 MG capsule Take 40 mg by mouth daily.  [name] gabapentin (NEURONTIN) 300 MG capsule TAKE 1 CAPSULE BY MOUTH THREE TIMES A DAY (Patient taking differently: Take 300 mg by mouth 3 (three) times daily. )  . hydrOXYzine (ATARAX/VISTARIL) 50 MG tablet Take 50 mg by mouth 3 (three) times daily as needed for anxiety.   [name] LORazepam (ATIVAN) 0.5 MG tablet Take 0.5 mg by mouth every 8 (eight) hours as needed for anxiety.   . rizatriptan (MAXALT-MLT) 10 MG disintegrating tablet Take 1 tablet (10 mg total) by mouth as needed for migraine. May repeat in 2 hours if needed   No facility-administered encounter medications on file as of 06/05/2019.    Follow-up: No follow-ups on file.  Will work on getting her back into her neurologist to evaluate her left eye pain. She is inline with this plan.   Tracy Jarvis, NP

## 2019-06-09 ENCOUNTER — Encounter: Payer: Self-pay | Admitting: Family Medicine

## 2019-06-09 ENCOUNTER — Telehealth: Payer: Self-pay | Admitting: Adult Health Nurse Practitioner

## 2019-06-09 ENCOUNTER — Ambulatory Visit (INDEPENDENT_AMBULATORY_CARE_PROVIDER_SITE_OTHER): Payer: Medicaid Other | Admitting: Family Medicine

## 2019-06-09 ENCOUNTER — Telehealth: Payer: Self-pay

## 2019-06-09 ENCOUNTER — Other Ambulatory Visit: Payer: Self-pay

## 2019-06-09 DIAGNOSIS — G43719 Chronic migraine without aura, intractable, without status migrainosus: Secondary | ICD-10-CM

## 2019-06-09 DIAGNOSIS — R519 Headache, unspecified: Secondary | ICD-10-CM

## 2019-06-09 LAB — CYTOLOGY - PAP
Adequacy: ABSENT
Chlamydia: NEGATIVE
Comment: NEGATIVE
Comment: NEGATIVE
Comment: NORMAL
Diagnosis: NEGATIVE
Neisseria Gonorrhea: NEGATIVE
Trichomonas: NEGATIVE

## 2019-06-09 NOTE — Progress Notes (Signed)
Botox Procedure. B/B Pt read and signed the consent form Form in box. Pt denies any changes to her medlist or taking bloodthinners.Patiet demonstrates understanding and had no questions.  Botox 2Count 100 units/vial NDC 4715-9539-67 [id number] Exp: 10/23  Saline (Bacteriostatic 0.9% sodium Chloride) NDC: 3643-8377-93 Lot: [id number] Exp: 07/02/2019

## 2019-06-09 NOTE — Telephone Encounter (Signed)
Spoke with pt and scheduled virtual with pcp

## 2019-06-09 NOTE — Progress Notes (Addendum)
This patient's first Botox procedure. Baseline 20-25 headache days per month, 10-12 migrainous days. She has tried and failed multiple preventative therapies. She continues gabapentin, magnesium and rizatriptan.   Consent Form Botulism Toxin Injection For Chronic Migraine    Reviewed orally with patient, additionally signature is on file:  Botulism toxin has been approved by the Federal drug administration for treatment of chronic migraine. Botulism toxin does not cure chronic migraine and it may not be effective in some patients.  The administration of botulism toxin is accomplished by injecting a small amount of toxin into the muscles of the neck and head. Dosage must be titrated for each individual. Any benefits resulting from botulism toxin tend to wear off after 3 months with a repeat injection required if benefit is to be maintained. Injections are usually done every 3-4 months with maximum effect peak achieved by about 2 or 3 weeks. Botulism toxin is expensive and you should be sure of what costs you will incur resulting from the injection.  The side effects of botulism toxin use for chronic migraine may include:   -Transient, and usually mild, facial weakness with facial injections  -Transient, and usually mild, head or neck weakness with head/neck injections  -Reduction or loss of forehead facial animation due to forehead muscle weakness  -Eyelid drooping  -Dry eye  -Pain at the site of injection or bruising at the site of injection  -Double vision  -Potential unknown long term risks   Contraindications: You should not have Botox if you are pregnant, nursing, allergic to albumin, have an infection, skin condition, or muscle weakness at the site of the injection, or have myasthenia gravis, Lambert-Eaton syndrome, or ALS.  It is also possible that as with any injection, there may be an allergic reaction or no effect from the medication. Reduced effectiveness after repeated  injections is sometimes seen and rarely infection at the injection site may occur. All care will be taken to prevent these side effects. If therapy is given over a long time, atrophy and wasting in the muscle injected may occur. Occasionally the patient's become refractory to treatment because they develop antibodies to the toxin. In this event, therapy needs to be modified.  I have read the above information and consent to the administration of botulism toxin.    BOTOX PROCEDURE NOTE FOR MIGRAINE HEADACHE  Contraindications and precautions discussed with patient(above). Aseptic procedure was observed and patient tolerated procedure. Procedure performed by Shawnie Dapper, FNP-C.   The condition has existed for more than 6 months, and pt does not have a diagnosis of ALS, Myasthenia Gravis or Lambert-Eaton Syndrome.  Risks and benefits of injections discussed and pt agrees to proceed with the procedure.  Written consent obtained  These injections are medically necessary. Pt  receives good benefits from these injections. These injections do not cause sedations or hallucinations which the oral therapies may cause.   Description of procedure:  The patient was placed in a sitting position. The standard protocol was used for Botox as follows, with 5 units of Botox injected at each site:  -Procerus muscle, midline injection  -Corrugator muscle, bilateral injection  -Frontalis muscle, bilateral injection, with 2 sites each side, medial injection was performed in the upper one third of the frontalis muscle, in the region vertical from the medial inferior edge of the superior orbital rim. The lateral injection was again in the upper one third of the forehead vertically above the lateral limbus of the cornea, 1.5 cm lateral  to the medial injection site.  -Temporalis muscle injection, 4 sites, bilaterally. The first injection was 3 cm above the tragus of the ear, second injection site was 1.5 cm to 3 cm up  from the first injection site in line with the tragus of the ear. The third injection site was 1.5-3 cm forward between the first 2 injection sites. The fourth injection site was 1.5 cm posterior to the second injection site. 5th site laterally in the temporalis  muscleat the level of the outer canthus.  -Occipitalis muscle injection, 3 sites, bilaterally. The first injection was done one half way between the occipital protuberance and the tip of the mastoid process behind the ear. The second injection site was done lateral and superior to the first, 1 fingerbreadth from the first injection. The third injection site was 1 fingerbreadth superiorly and medially from the first injection site.  -Cervical paraspinal muscle injection, 2 sites, bilateral knee first injection site was 1 cm from the midline of the cervical spine, 3 cm inferior to the lower border of the occipital protuberance. The second injection site was 1.5 cm superiorly and laterally to the first injection site.  -Trapezius muscle injection was performed at 3 sites, bilaterally. The first injection site was in the upper trapezius muscle halfway between the inflection point of the neck, and the acromion. The second injection site was one half way between the acromion and the first injection site. The third injection was done between the first injection site and the inflection point of the neck.   Will return for repeat injection in 3 months.   A total of 200 units of Botox was prepared, 155 units of Botox was injected as documented above, any Botox not injected was wasted. The patient tolerated the procedure well, there were no complications of the above procedure.  Made any corrections needed, and agree with procedure note  Sarina Ill, MD [facility]

## 2019-06-09 NOTE — Telephone Encounter (Signed)
Pt saw Amy Lowmax at [facility] for her botox and recommend  to see a eye dr again or get a lumbar puncture. Pt is wanting a to talk to a Nurse or provider regarding this. [phone]. Please advise.

## 2019-06-09 NOTE — Telephone Encounter (Signed)
(  documentation purposes only. For her visit w/ NP AmyLomax. 06/09/19) Botox Procedure. B/B Pt read and signed the consent form Form in box. Pt denies any changes to her medlist or taking bloodthinners.Patiet demonstrates understanding and had no questions.  Botox 2Count 100 units/vial NDC 4562-5638-93 TDS:[id number] Exp: 10/23  Saline (Bacteriostatic 0.9% sodium Chloride) NDC: 5726-2035-59 Lot: [id number] Exp: 07/02/2019

## 2019-06-09 NOTE — Telephone Encounter (Signed)
Pt is needing a telemed appt medical concerns regarding recommendations given She is a pt of  S. Colette Ribas

## 2019-06-10 ENCOUNTER — Emergency Department (HOSPITAL_COMMUNITY): Payer: Medicaid Other

## 2019-06-10 ENCOUNTER — Other Ambulatory Visit: Payer: Self-pay

## 2019-06-10 ENCOUNTER — Telehealth: Payer: Self-pay | Admitting: Family Medicine

## 2019-06-10 ENCOUNTER — Telehealth (INDEPENDENT_AMBULATORY_CARE_PROVIDER_SITE_OTHER): Payer: Medicaid Other | Admitting: Adult Health Nurse Practitioner

## 2019-06-10 ENCOUNTER — Encounter (HOSPITAL_COMMUNITY): Payer: Self-pay | Admitting: Emergency Medicine

## 2019-06-10 ENCOUNTER — Inpatient Hospital Stay (HOSPITAL_COMMUNITY)
Admission: EM | Admit: 2019-06-10 | Discharge: 2019-06-14 | DRG: 123 | Disposition: A | Discharge status: Home / Self Care | Payer: Medicaid Other | Attending: Family Medicine | Admitting: Family Medicine

## 2019-06-10 DIAGNOSIS — H538 Other visual disturbances: Secondary | ICD-10-CM

## 2019-06-10 DIAGNOSIS — G43909 Migraine, unspecified, not intractable, without status migrainosus: Secondary | ICD-10-CM | POA: Diagnosis present

## 2019-06-10 DIAGNOSIS — H468 Other optic neuritis: Secondary | ICD-10-CM | POA: Diagnosis not present

## 2019-06-10 DIAGNOSIS — H579 Unspecified disorder of eye and adnexa: Secondary | ICD-10-CM | POA: Diagnosis not present

## 2019-06-10 DIAGNOSIS — H547 Unspecified visual loss: Secondary | ICD-10-CM | POA: Diagnosis not present

## 2019-06-10 DIAGNOSIS — Z20822 Contact with and (suspected) exposure to covid-19: Secondary | ICD-10-CM | POA: Diagnosis not present

## 2019-06-10 DIAGNOSIS — D72829 Elevated white blood cell count, unspecified: Secondary | ICD-10-CM | POA: Diagnosis present

## 2019-06-10 DIAGNOSIS — H469 Unspecified optic neuritis: Secondary | ICD-10-CM | POA: Diagnosis present

## 2019-06-10 DIAGNOSIS — F411 Generalized anxiety disorder: Secondary | ICD-10-CM | POA: Diagnosis present

## 2019-06-10 DIAGNOSIS — F329 Major depressive disorder, single episode, unspecified: Secondary | ICD-10-CM | POA: Diagnosis present

## 2019-06-10 LAB — CBC WITH DIFFERENTIAL/PLATELET
Abs Immature Granulocytes: 0.03 10*3/uL (ref 0.00–0.07)
Basophils Absolute: 0 10*3/uL (ref 0.0–0.1)
Basophils Relative: 0 %
Eosinophils Absolute: 0.2 10*3/uL (ref 0.0–0.5)
Eosinophils Relative: 2 %
HCT: 42.3 % (ref 36.0–46.0)
Hemoglobin: 14.4 g/dL (ref 12.0–15.0)
Immature Granulocytes: 0 %
Lymphocytes Relative: 21 %
Lymphs Abs: 2 10*3/uL (ref 0.7–4.0)
MCH: 29.8 pg (ref 26.0–34.0)
MCHC: 34 g/dL (ref 30.0–36.0)
MCV: 87.4 fL (ref 80.0–100.0)
Monocytes Absolute: 0.8 10*3/uL (ref 0.1–1.0)
Monocytes Relative: 8 %
Neutro Abs: 6.6 10*3/uL (ref 1.7–7.7)
Neutrophils Relative %: 69 %
Platelets: 393 10*3/uL (ref 150–400)
RBC: 4.84 MIL/uL (ref 3.87–5.11)
RDW: 11.9 % (ref 11.5–15.5)
WBC: 9.7 10*3/uL (ref 4.0–10.5)
nRBC: 0 % (ref 0.0–0.2)

## 2019-06-10 LAB — BASIC METABOLIC PANEL
Anion gap: 9 (ref 5–15)
BUN: 6 mg/dL (ref 6–20)
CO2: 22 mmol/L (ref 22–32)
Calcium: 9.6 mg/dL (ref 8.9–10.3)
Chloride: 106 mmol/L (ref 98–111)
Creatinine, Ser: 0.77 mg/dL (ref 0.44–1.00)
GFR calc Af Amer: >60 mL/min (ref >60)
GFR calc non Af Amer: >60 mL/min (ref >60)
Glucose, Bld: 90 mg/dL (ref 70–99)
Potassium: 3.8 mmol/L (ref 3.5–5.1)
Sodium: 137 mmol/L (ref 135–145)

## 2019-06-10 LAB — HIV ANTIBODY (ROUTINE TESTING W REFLEX): HIV Screen 4th Generation wRfx: NONREACTIVE

## 2019-06-10 LAB — I-STAT BETA HCG BLOOD, ED (MC, WL, AP ONLY): I-stat hCG, quantitative: <5 m[IU]/mL (ref <5)

## 2019-06-10 MED ORDER — HYDROXYZINE HCL 25 MG PO TABS
50.0000 mg | ORAL_TABLET | Freq: Three times a day (TID) | ORAL | Status: DC | PRN
  Administered 2019-06-11 – 2019-06-12 (×2): 50 mg via ORAL
  Filled 2019-06-10 (×3): qty 2

## 2019-06-10 MED ORDER — PANTOPRAZOLE SODIUM 40 MG IV SOLR
40.0000 mg | Freq: Every day | INTRAVENOUS | Status: DC
  Administered 2019-06-10: 23:00:00 40 mg via INTRAVENOUS
  Filled 2019-06-10: qty 40

## 2019-06-10 MED ORDER — ENOXAPARIN SODIUM 40 MG/0.4ML ~~LOC~~ SOLN
40.0000 mg | SUBCUTANEOUS | Status: DC
  Administered 2019-06-10 – 2019-06-13 (×4): 40 mg via SUBCUTANEOUS
  Filled 2019-06-10 (×4): qty 0.4

## 2019-06-10 MED ORDER — BUPROPION HCL ER (XL) 300 MG PO TB24
300.0000 mg | ORAL_TABLET | Freq: Every day | ORAL | Status: DC
  Administered 2019-06-11 – 2019-06-14 (×4): 300 mg via ORAL
  Filled 2019-06-10 (×3): qty 1
  Filled 2019-06-10: qty 2

## 2019-06-10 MED ORDER — BUSPIRONE HCL 10 MG PO TABS
30.0000 mg | ORAL_TABLET | Freq: Two times a day (BID) | ORAL | Status: DC
  Administered 2019-06-10 – 2019-06-14 (×8): 30 mg via ORAL
  Filled 2019-06-10 (×9): qty 3

## 2019-06-10 MED ORDER — ACETAMINOPHEN 500 MG PO TABS
500.0000 mg | ORAL_TABLET | Freq: Four times a day (QID) | ORAL | Status: DC | PRN
  Administered 2019-06-10 – 2019-06-13 (×4): 500 mg via ORAL
  Filled 2019-06-10 (×4): qty 1

## 2019-06-10 MED ORDER — LORAZEPAM 0.5 MG PO TABS
0.5000 mg | ORAL_TABLET | Freq: Three times a day (TID) | ORAL | Status: DC | PRN
  Administered 2019-06-11 (×3): 0.5 mg via ORAL
  Filled 2019-06-10 (×3): qty 1

## 2019-06-10 MED ORDER — SODIUM CHLORIDE 0.9 % IV SOLN
1000.0000 mg | Freq: Once | INTRAVENOUS | Status: AC
  Administered 2019-06-10: 1000 mg via INTRAVENOUS
  Filled 2019-06-10: qty 8

## 2019-06-10 MED ORDER — GADOBUTROL 1 MMOL/ML IV SOLN
6.0000 mL | Freq: Once | INTRAVENOUS | Status: AC | PRN
  Administered 2019-06-10: 6 mL via INTRAVENOUS

## 2019-06-10 MED ORDER — FLUOXETINE HCL 20 MG PO CAPS
40.0000 mg | ORAL_CAPSULE | Freq: Every day | ORAL | Status: DC
  Administered 2019-06-11 – 2019-06-14 (×4): 40 mg via ORAL
  Filled 2019-06-10 (×4): qty 2

## 2019-06-10 MED ORDER — GABAPENTIN 300 MG PO CAPS: 300.0000 mg | ORAL_CAPSULE | Freq: Three times a day (TID) | ORAL | Status: DC | PRN

## 2019-06-10 NOTE — ED Notes (Signed)
Patient  Boyfriend Jefferey Pica [phone] calling asking for an update on patient

## 2019-06-10 NOTE — H&P (Addendum)
Family Medicine Teaching [facility] Admission History and Physical Service Pager: [phone]  Patient name: Tracy Mcconnell Medical record number: [id number] Date of birth: 05-Jul-1995 Age: 24 y.o. Gender: female  Primary Care Provider: Royal Hawthorn, NP Consultants: Neurology Code Status: Full Preferred Emergency Contact: Billee Cashing, [phone]  Chief Complaint: Left eye pain and changes in vision  Assessment and Plan: Tracy Mcconnell is a 24 y.o. female presenting with changes in vision. PMH is significant for history of migraines.  Changes in vision due to acute optic neuritis Acute, never had these symptoms before. Differential includes MS, Neuromyelitis optica spectrum disorder (NOSD), MOG-EM, and SLE as causes for her optic neuritis. Less likely to be NOSD and MOG-EM as patient has unilateral optic neuritis and these conditions will typically present with bilateral. SLE less likely as she has no malar nasolabial fold sparring rash, normal creatinine, no joint pain, skin rash, photosensitivity, mucosal ulcers. However, this could be an early presenting symptom.  MS also considered but patient MRI findings not consistent with typical MS findings however we cannot rule out at this time. Patient has not every had this before and has no history of urinary incontinence. She does have a history of unilateral upper extremity weakness presenting and resolving randomly in each arm. Patient with onset of symptoms 06/01/2018.  She was evaluated in the emergency department 06/03/2018 and then has seen her PCP twice since that time.  She was seen by an eye doctor who recommended follow-up with a neurologist and her PCP.  Her PCP then sent her to Dr. Alden Hipp who she saw today and after her visit was recommended to come to the emergency for evaluation for optic neuritis.  In the ED neurology was consulted and they recommended an MRI.  MRI showed irregularity with heterogeneous enhancement  about the left optic nerve consistent with acute optic neuritis.  There were also scattered subcentimeter FLAIR hyperintensities involving the right periventricular and juxtacortical white matter.  The radiologist notes that they are nonspecific but could be early and/or mild demyelinating disease considering the acute optic neuritis.  CBC and BMP were done in the emergency department and came back completely normal. -Admit to inpatient teaching service with Dr. McDiarmid is the attending -Neuro consulted, appreciate recommendations -Solu-Medrol 1 g for 3-5 days daily -Protonix 40 mg IV for gut protection -Vitals per floor routine -Regular diet -Morning BMPs -CBC every 3 days -PT/OT eval and treat - Obtain ANA and ds-DNA to rule out SLE - Consider using McDonald criteria in consideration of MS  History of intermittent upper extremity weakness Patient reports history of intermittent unilateral upper extremity weakness, numbness, tingling which she has experienced in her right and left upper extremity.  She notices the weakness when she has difficulty gripping things.  Patient reports maternal aunt with MS diagnosis.  MRI showed subcentimeter FLAIR hyperintensities involving the right periventricular and juxtacortical white matter.  Neurology waiting on these and say that they are not consistent with MS but that the patient should be monitored closely for MS given the optic neuritis diagnosis. - Continue to monitor - Consider using McDonald criteria in consideration of MS  History of migraines Patient reports history of migraines.  She says that she was diagnosed with chronic persistent migraines.  Most recent migraine was "a few weeks ago".  Home medications include rizatriptan 10 mg as needed for migraines. -Not ordered at this time but may rizatriptan if patient has migraine  Anxiety and Depression Patient has history of anxiety and  depression. Not endorsing depression and anxiety now and  states she is well controlled on home medications. - Cont Bupropion 300mg  once daily - Cont Buspirone 30mg  BID daily - Cont Fluoxetine 40mg  once daily - Cont Hyddroxyzine 50mg , TID prn - Cont Ativan 0.5mg , q8hr prn  FEN/GI: Regular diet Prophylaxis: Lovenox  Disposition: Admit to inpatient teaching service  History of Present Illness:  Tracy Mcconnell is a 24 y.o. female presenting with changes in vision.  Patient reports her change in vision started 06/01/2019.  She woke up and said that her vision was blurry in her left eye.  Over the past 10 days her vision has got blurrier and darker in that eye.  Patient was seen in the emergency room on 3/3 for this issue.  She followed up with her primary care who then sent her to an eye doctor.  Her vision continued to get worse so she returned to her PCP who sent her to an ophthalmologist today.  That ophthalmologist did a dilated eye exam and sent her back to the emergency department after seeing she had optic neuritis in her left eye.  Patient reports that this is never occurred before although she does acknowledge occasional issues with vision when having migraines.  Patient has a longstanding history of chronic persistent migraines.  Patient's last migraine was a few weeks ago.  Patient also reports history of intermittent unilateral upper extremity weakness which has occurred at different times in the right and left upper extremity.  She reports that she notices it when she has difficulty holding things.  Review Of Systems: Per HPI with the following additions:  Review of Systems  Constitutional: Negative.   HENT: Negative for hearing loss.   Eyes: Positive for blurred vision (Left eye) and pain (Left eye).  Respiratory: Negative for shortness of breath.   Cardiovascular: Negative for chest pain, palpitations and leg swelling.  Gastrointestinal: Negative for abdominal pain, constipation, diarrhea, nausea and vomiting.  Genitourinary:  Negative for dysuria, frequency and urgency.  Neurological: Positive for headaches (History of chronic migraines). Negative for sensory change, focal weakness and weakness.    Patient Active Problem List   Diagnosis Date Noted  . Left eye complaint 06/08/2019  . Screening for cervical cancer 06/08/2019  . Hypovitaminosis D 04/07/2019  . Migraines 10/28/2018  . Chronic daily headache 02/04/2017    Past Medical History: Past Medical History:  Diagnosis Date  . Anxiety   . Depression   . Hypovitaminosis D 04/07/2019  . Migraines     Past Surgical History: Past Surgical History:  Procedure Laterality Date  . NO PAST SURGERIES      Social History: Social History   Tobacco Use  . Smoking status: Never Smoker  . Smokeless tobacco: Never Used  Substance Use Topics  . Alcohol use: No  . Drug use: No   Additional social history:   Please also refer to relevant sections of EMR.  Family History: Family History  Problem Relation Age of Onset  . Migraines Mother   . Thyroid disease Mother   . Cancer Maternal Grandmother   . Diabetes Paternal Grandfather   . Hypertension Maternal Grandfather   . Diabetes Paternal Grandmother   . Stroke Paternal Grandmother    Maternal aunt who has multiple sclerosis.  Allergies and Medications: Allergies  Allergen Reactions  . Sumatriptan Shortness Of Breath and Other (See Comments)    Entire body felt heavy   No current facility-administered medications on file prior to encounter.  Current Outpatient Medications on File Prior to Encounter  Medication Sig Dispense Refill  . acetaminophen (TYLENOL) 500 MG tablet Take 500 mg by mouth every 6 (six) hours as needed (pain).    [name] buPROPion (WELLBUTRIN XL) 300 MG 24 hr tablet Take 300 mg by mouth daily.    . busPIRone (BUSPAR) 30 MG tablet Take 30 mg by mouth 2 (two) times daily.    [name] doxycycline (VIBRAMYCIN) 100 MG capsule Take 1 capsule (100 mg total) by mouth 2 (two) times daily for 14  days. (Patient not taking: Reported on 06/10/2019) 28 capsule 0  . FLUoxetine (PROZAC) 40 MG capsule Take 40 mg by mouth daily.    [name] gabapentin (NEURONTIN) 300 MG capsule TAKE 1 CAPSULE BY MOUTH THREE TIMES A DAY (Patient not taking: No sig reported) 90 capsule 1  . hydrOXYzine (ATARAX/VISTARIL) 50 MG tablet Take 50 mg by mouth 3 (three) times daily as needed for anxiety.     [name] LORazepam (ATIVAN) 0.5 MG tablet Take 0.5 mg by mouth every 8 (eight) hours as needed for anxiety.     . rizatriptan (MAXALT-MLT) 10 MG disintegrating tablet Take 1 tablet (10 mg total) by mouth as needed for migraine. May repeat in 2 hours if needed 9 tablet 11    Objective: BP (!) 123/94   Pulse 96   Temp 98.4 F (36.9 C) (Oral)   Resp 17   Ht 5' (1.524 m)   Wt 61.2 kg   LMP 05/27/2019   SpO2 95%   BMI 26.37 kg/m  Exam: Physical Exam  Constitutional: She is well-developed, well-nourished, and in no distress. No distress.  HENT:  Head: Normocephalic and atraumatic.  Eyes: Pupils are equal, round, and reactive to light. Conjunctivae and EOM are normal. Right eye exhibits no discharge. Left eye exhibits no discharge.  Patient noted pain in left eye with extraocular movements  Cardiovascular: Normal rate, regular rhythm and normal heart sounds. Exam reveals no gallop.  No murmur heard. Pulmonary/Chest: Effort normal and breath sounds normal. No respiratory distress. She has no wheezes. She has no rales.  Abdominal: Soft. Bowel sounds are normal. She exhibits no distension.  Musculoskeletal:        General: No tenderness, deformity or edema.     Cervical back: Neck supple.  Lymphadenopathy:    She has no cervical adenopathy.  Neurological: She is alert. No cranial nerve deficit. GCS score is 15.  Skin: Skin is warm and dry. No rash noted. She is not diaphoretic.  Psychiatric: Mood, affect and judgment normal.     Labs and Imaging: CBC BMET  Recent Labs  Lab 06/10/19 1527  WBC 9.7  HGB 14.4  HCT  42.3  PLT 393   Recent Labs  Lab 06/10/19 1527  NA 137  K 3.8  CL 106  CO2 22  BUN 6  CREATININE 0.77  GLUCOSE 90  CALCIUM 9.6     MR BRAIN W WO CONTRAST  Result Date: 06/10/2019 CLINICAL DATA:  Initial evaluation for suspected optic neuritis. EXAM: MRI HEAD AND ORBITS WITHOUT AND WITH CONTRAST TECHNIQUE: Multiplanar, multiecho pulse sequences of the brain and surrounding structures were obtained without and with intravenous contrast. Multiplanar, multiecho pulse sequences of the orbits and surrounding structures were obtained including fat saturation techniques, before and after intravenous contrast administration. CONTRAST:  69mL GADAVIST GADOBUTROL 1 MMOL/ML IV SOLN COMPARISON:  Prior MRI from 02/12/2017. FINDINGS: MRI HEAD FINDINGS Brain: Cerebral volume within normal limits. Single 7 mm focus of T2/FLAIR  hyperintensities seen involving the periventricular white matter adjacent to the frontal horn of the right lateral ventricle (series 5, image 12), nonspecific. Additional subcentimeter juxta cortical FLAIR hyperintensity noted at the right parietal lobe (series 5, image 10). No other definite focal parenchymal signal abnormality. No evidence for acute or subacute infarct. Gray-white matter differentiation maintained. No encephalomalacia to suggest chronic cortical infarction. No evidence for acute or chronic intracranial hemorrhage. No mass lesion, midline shift or mass effect. No hydrocephalus. No extra-axial fluid collection. Pituitary gland suprasellar region normal. Midline structures intact. No abnormal enhancement. Vascular: Major intracranial vascular flow voids are well maintained. Skull and upper cervical spine: Craniocervical junction within normal limits. Bone marrow signal intensity normal. No scalp soft tissue abnormality. Other: No mastoid effusion. MRI ORBITS FINDINGS Orbits: Globes are symmetric in size with normal morphology and appearance bilaterally. There is mild asymmetric  enlargement and irregularity about the left optic nerve with heterogeneous postcontrast enhancement, suggesting acute optic neuritis (series 16, image 12). Involvement primarily consists of the intraorbital portion of the nerve. Right optic nerve normal in appearance without inflammation or enhancement. No abnormality about the orbital apices. Optic chiasm normally situated within the suprasellar cistern. Visualized optic radiations within normal limits. Extra-ocular muscles symmetric and normal. Lacrimal glands normal. Superior orbital veins within normal limits. No abnormality about the cavernous sinus. Visualized sinuses: Mild mucosal thickening noted within the ethmoidal air cells in left maxillary sinus. Soft tissues: Unremarkable. IMPRESSION: 1. Irregularity with heterogeneous enhancement about the left optic nerve, consistent with acute optic neuritis. 2. Few scattered subcentimeter FLAIR hyperintensities involving the right periventricular and juxtacortical white matter as above. While these findings are nonspecific, possible early and/or mild demyelinating disease is considered given the acute optic neuritis. No abnormal enhancement. 3. Otherwise normal MRI of the brain and orbits. Electronically Signed   By: Jeannine Boga M.D.   On: 06/10/2019 19:00   MR ORBITS W WO CONTRAST  Result Date: 06/10/2019 CLINICAL DATA:  Initial evaluation for suspected optic neuritis. EXAM: MRI HEAD AND ORBITS WITHOUT AND WITH CONTRAST TECHNIQUE: Multiplanar, multiecho pulse sequences of the brain and surrounding structures were obtained without and with intravenous contrast. Multiplanar, multiecho pulse sequences of the orbits and surrounding structures were obtained including fat saturation techniques, before and after intravenous contrast administration. CONTRAST:  98mL GADAVIST GADOBUTROL 1 MMOL/ML IV SOLN COMPARISON:  Prior MRI from 02/12/2017. FINDINGS: MRI HEAD FINDINGS Brain: Cerebral volume within normal  limits. Single 7 mm focus of T2/FLAIR hyperintensities seen involving the periventricular white matter adjacent to the frontal horn of the right lateral ventricle (series 5, image 12), nonspecific. Additional subcentimeter juxta cortical FLAIR hyperintensity noted at the right parietal lobe (series 5, image 10). No other definite focal parenchymal signal abnormality. No evidence for acute or subacute infarct. Gray-white matter differentiation maintained. No encephalomalacia to suggest chronic cortical infarction. No evidence for acute or chronic intracranial hemorrhage. No mass lesion, midline shift or mass effect. No hydrocephalus. No extra-axial fluid collection. Pituitary gland suprasellar region normal. Midline structures intact. No abnormal enhancement. Vascular: Major intracranial vascular flow voids are well maintained. Skull and upper cervical spine: Craniocervical junction within normal limits. Bone marrow signal intensity normal. No scalp soft tissue abnormality. Other: No mastoid effusion. MRI ORBITS FINDINGS Orbits: Globes are symmetric in size with normal morphology and appearance bilaterally. There is mild asymmetric enlargement and irregularity about the left optic nerve with heterogeneous postcontrast enhancement, suggesting acute optic neuritis (series 16, image 12). Involvement primarily consists of the intraorbital portion of  the nerve. Right optic nerve normal in appearance without inflammation or enhancement. No abnormality about the orbital apices. Optic chiasm normally situated within the suprasellar cistern. Visualized optic radiations within normal limits. Extra-ocular muscles symmetric and normal. Lacrimal glands normal. Superior orbital veins within normal limits. No abnormality about the cavernous sinus. Visualized sinuses: Mild mucosal thickening noted within the ethmoidal air cells in left maxillary sinus. Soft tissues: Unremarkable. IMPRESSION: 1. Irregularity with heterogeneous  enhancement about the left optic nerve, consistent with acute optic neuritis. 2. Few scattered subcentimeter FLAIR hyperintensities involving the right periventricular and juxtacortical white matter as above. While these findings are nonspecific, possible early and/or mild demyelinating disease is considered given the acute optic neuritis. No abnormal enhancement. 3. Otherwise normal MRI of the brain and orbits. Electronically Signed   By: Rise Mu M.D.   On: 06/10/2019 19:00    Derrel Nip, MD 06/10/2019, 7:42 PM PGY-1, [facility] FPTS Intern pager: [phone], text pages welcome  Resident Attestation   I saw and evaluated the patient, performing the key elements of the service. I personally performed or re-performed the history, physical exam, and medical decision making activities of this service and have verified that the service and findings are accurately documented in the resident's note. I developed the management plan that is described in the resident's note, and I agree with the content, with my edits above in red.   Jules Schick, DO [facility], PGY-3

## 2019-06-10 NOTE — Telephone Encounter (Signed)
Rep called on behalf of christopher Groat stating Pt is currently at the office and they believe she has optics neuritis and would like to go over the pt's next steps.  Rep provided Dr.Groats cell phone number to call back.

## 2019-06-10 NOTE — Patient Instructions (Signed)
° ° ° °  If you have lab work done today you will be contacted with your lab results within the next 2 weeks.  If you have not heard from us then please contact us. The fastest way to get your results is to register for My Chart. ° ° °IF you received an x-ray today, you will receive an invoice from [facility]. Please contact [facility] at [phone] with questions or concerns regarding your invoice.  ° °IF you received labwork today, you will receive an invoice from [facility]. Please contact [facility] at [phone] with questions or concerns regarding your invoice.  ° °Our billing staff will not be able to assist you with questions regarding bills from these companies. ° °You will be contacted with the lab results as soon as they are available. The fastest way to get your results is to activate your My Chart account. Instructions are located on the last page of this paperwork. If you have not heard from us regarding the results in 2 weeks, please contact this office. °  ° ° ° °

## 2019-06-10 NOTE — ED Provider Notes (Signed)
[facility] EMERGENCY DEPARTMENT Provider Note   CSN: [id number] Arrival date & time: 06/10/19  1240     History No chief complaint on file.   Tracy Mcconnell is a 24 y.o. female with history of chronic migraines followed by [facility] neuro who presents with blurry vision in the left eye. The patient states her symptoms started 9 days ago. She woke up and her vision was blurry only in the left eye. She didn't think much of it because of her migraines and sometimes she gets blurry vision but usually it will go away. The blurry vision persisted and then the next day she started to have eye pain and pain with eye movements. She came to the ED on 3/3 and had an ocular [place] which was normal and was told to f/u with an eye doctor. She saw an eye doctor and was told nothing was wrong and to see her neurologist and PCP. She went to Dr. Dione Booze today and he was concerned for optic neuritis and recommended ED eval. Pt denies any other symptoms. No fever, dizziness, headaches, right eye symptoms, numbness, tingling, extremity weakness.  HPI     Past Medical History:  Diagnosis Date  . Anxiety   . Depression   . Hypovitaminosis D 04/07/2019  . Migraines     Patient Active Problem List   Diagnosis Date Noted  . Left eye complaint 06/08/2019  . Screening for cervical cancer 06/08/2019  . Hypovitaminosis D 04/07/2019  . Migraines 10/28/2018  . Chronic daily headache 02/04/2017    Past Surgical History:  Procedure Laterality Date  . NO PAST SURGERIES       OB History   No obstetric history on file.     Family History  Problem Relation Age of Onset  . Migraines Mother   . Thyroid disease Mother   . Cancer Maternal Grandmother   . Diabetes Paternal Grandfather   . Hypertension Maternal Grandfather   . Diabetes Paternal Grandmother   . Stroke Paternal Grandmother     Social History   Tobacco Use  . Smoking status: Never Smoker  . Smokeless tobacco: Never  Used  Substance Use Topics  . Alcohol use: No  . Drug use: No    Home Medications Prior to Admission medications   Medication Sig Start Date End Date Taking? Authorizing Provider  acetaminophen (TYLENOL) 500 MG tablet Take 500 mg by mouth every 6 (six) hours as needed (pain).    [provider]  buPROPion (WELLBUTRIN XL) 300 MG 24 hr tablet Take 300 mg by mouth daily. 05/28/19   [provider]  busPIRone (BUSPAR) 30 MG tablet Take 30 mg by mouth 2 (two) times daily. 05/07/19   [provider]  doxycycline (VIBRAMYCIN) 100 MG capsule Take 1 capsule (100 mg total) by mouth 2 (two) times daily for 14 days. Patient not taking: Reported on 06/10/2019 05/29/19 06/12/19  Hunter, [place], MD  FLUoxetine (PROZAC) 40 MG capsule Take 40 mg by mouth daily. 05/07/19   [provider]  gabapentin (NEURONTIN) 300 MG capsule TAKE 1 CAPSULE BY MOUTH THREE TIMES A DAY Patient not taking: No sig reported 04/06/19   Penumalli, Glenford Bayley, MD  hydrOXYzine (ATARAX/VISTARIL) 50 MG tablet Take 50 mg by mouth 3 (three) times daily as needed for anxiety.     [provider]  LORazepam (ATIVAN) 0.5 MG tablet Take 0.5 mg by mouth every 8 (eight) hours as needed for anxiety.     [provider]  rizatriptan (MAXALT-MLT) 10 MG disintegrating tablet Take 1 tablet (10 mg total) by mouth as needed for migraine. May repeat in 2 hours if needed 09/01/18   Lomax, Amy, NP    Allergies    Sumatriptan  Review of Systems   Review of Systems  Constitutional: Negative for fever.  Eyes: Positive for pain and visual disturbance.  Neurological: Negative for dizziness, speech difficulty, weakness, numbness and headaches.  All other systems reviewed and are negative.   Physical Exam Updated Vital Signs BP (!) 126/102 (BP Location: Right Arm)   Pulse 77   Temp 98.4 F (36.9 C) (Oral)   Resp 17   LMP 05/27/2019   SpO2 97%   Physical Exam Vitals and nursing note reviewed.    Constitutional:      General: She is not in acute distress.    Appearance: Normal appearance. She is well-developed. She is not ill-appearing.     Comments: Calm and cooperative. NAD  HENT:     Head: Normocephalic and atraumatic.  Eyes:     General: Lids are normal. Lids are everted, no foreign bodies appreciated. Vision grossly intact. No scleral icterus.       Right eye: No foreign body or discharge.        Left eye: No foreign body or discharge.     Extraocular Movements: Extraocular movements intact.     Conjunctiva/sclera: Conjunctivae normal.     Pupils: Pupils are equal, round, and reactive to light.  Cardiovascular:     Rate and Rhythm: Normal rate.  Pulmonary:     Effort: Pulmonary effort is normal. No respiratory distress.  Abdominal:     General: There is no distension.  Musculoskeletal:     Cervical back: Normal range of motion.  Skin:    General: Skin is warm and dry.  Neurological:     Mental Status: She is alert and oriented to person, place, and time.  Psychiatric:        Behavior: Behavior normal.     ED Results / Procedures / Treatments   Labs (all labs ordered are listed, but only abnormal results are displayed) Labs Reviewed  SARS CORONAVIRUS 2 (TAT 6-24 HRS)  BASIC METABOLIC PANEL  CBC WITH DIFFERENTIAL/PLATELET  I-STAT BETA HCG BLOOD, ED ([facility], [facility], [facility] ONLY)    EKG None  Radiology MR BRAIN W WO CONTRAST  Result Date: 06/10/2019 CLINICAL DATA:  Initial evaluation for suspected optic neuritis. EXAM: MRI HEAD AND ORBITS WITHOUT AND WITH CONTRAST TECHNIQUE: Multiplanar, multiecho pulse sequences of the brain and surrounding structures were obtained without and with intravenous contrast. Multiplanar, multiecho pulse sequences of the orbits and surrounding structures were obtained including fat saturation techniques, before and after intravenous contrast administration. CONTRAST:  48mL GADAVIST GADOBUTROL 1 MMOL/ML IV SOLN COMPARISON:  Prior MRI from  02/12/2017. FINDINGS: MRI HEAD FINDINGS Brain: Cerebral volume within normal limits. Single 7 mm focus of T2/FLAIR hyperintensities seen involving the periventricular white matter adjacent to the frontal horn of the right lateral ventricle (series 5, image 12), nonspecific. Additional subcentimeter juxta cortical FLAIR hyperintensity noted at the right parietal lobe (series 5, image 10). No other definite focal parenchymal signal abnormality. No evidence for acute or subacute infarct. Gray-white matter differentiation maintained. No encephalomalacia to suggest chronic cortical infarction. No evidence for acute or chronic intracranial hemorrhage. No mass lesion, midline shift or mass effect. No hydrocephalus. No extra-axial fluid collection. Pituitary gland suprasellar region normal. Midline structures intact. No abnormal enhancement. Vascular: Major intracranial vascular  flow voids are well maintained. Skull and upper cervical spine: Craniocervical junction within normal limits. Bone marrow signal intensity normal. No scalp soft tissue abnormality. Other: No mastoid effusion. MRI ORBITS FINDINGS Orbits: Globes are symmetric in size with normal morphology and appearance bilaterally. There is mild asymmetric enlargement and irregularity about the left optic nerve with heterogeneous postcontrast enhancement, suggesting acute optic neuritis (series 16, image 12). Involvement primarily consists of the intraorbital portion of the nerve. Right optic nerve normal in appearance without inflammation or enhancement. No abnormality about the orbital apices. Optic chiasm normally situated within the suprasellar cistern. Visualized optic radiations within normal limits. Extra-ocular muscles symmetric and normal. Lacrimal glands normal. Superior orbital veins within normal limits. No abnormality about the cavernous sinus. Visualized sinuses: Mild mucosal thickening noted within the ethmoidal air cells in left maxillary sinus. Soft  tissues: Unremarkable. IMPRESSION: 1. Irregularity with heterogeneous enhancement about the left optic nerve, consistent with acute optic neuritis. 2. Few scattered subcentimeter FLAIR hyperintensities involving the right periventricular and juxtacortical white matter as above. While these findings are nonspecific, possible early and/or mild demyelinating disease is considered given the acute optic neuritis. No abnormal enhancement. 3. Otherwise normal MRI of the brain and orbits. Electronically Signed   By: Jeannine Boga M.D.   On: 06/10/2019 19:00   MR ORBITS W WO CONTRAST  Result Date: 06/10/2019 CLINICAL DATA:  Initial evaluation for suspected optic neuritis. EXAM: MRI HEAD AND ORBITS WITHOUT AND WITH CONTRAST TECHNIQUE: Multiplanar, multiecho pulse sequences of the brain and surrounding structures were obtained without and with intravenous contrast. Multiplanar, multiecho pulse sequences of the orbits and surrounding structures were obtained including fat saturation techniques, before and after intravenous contrast administration. CONTRAST:  70mL GADAVIST GADOBUTROL 1 MMOL/ML IV SOLN COMPARISON:  Prior MRI from 02/12/2017. FINDINGS: MRI HEAD FINDINGS Brain: Cerebral volume within normal limits. Single 7 mm focus of T2/FLAIR hyperintensities seen involving the periventricular white matter adjacent to the frontal horn of the right lateral ventricle (series 5, image 12), nonspecific. Additional subcentimeter juxta cortical FLAIR hyperintensity noted at the right parietal lobe (series 5, image 10). No other definite focal parenchymal signal abnormality. No evidence for acute or subacute infarct. Gray-white matter differentiation maintained. No encephalomalacia to suggest chronic cortical infarction. No evidence for acute or chronic intracranial hemorrhage. No mass lesion, midline shift or mass effect. No hydrocephalus. No extra-axial fluid collection. Pituitary gland suprasellar region normal. Midline  structures intact. No abnormal enhancement. Vascular: Major intracranial vascular flow voids are well maintained. Skull and upper cervical spine: Craniocervical junction within normal limits. Bone marrow signal intensity normal. No scalp soft tissue abnormality. Other: No mastoid effusion. MRI ORBITS FINDINGS Orbits: Globes are symmetric in size with normal morphology and appearance bilaterally. There is mild asymmetric enlargement and irregularity about the left optic nerve with heterogeneous postcontrast enhancement, suggesting acute optic neuritis (series 16, image 12). Involvement primarily consists of the intraorbital portion of the nerve. Right optic nerve normal in appearance without inflammation or enhancement. No abnormality about the orbital apices. Optic chiasm normally situated within the suprasellar cistern. Visualized optic radiations within normal limits. Extra-ocular muscles symmetric and normal. Lacrimal glands normal. Superior orbital veins within normal limits. No abnormality about the cavernous sinus. Visualized sinuses: Mild mucosal thickening noted within the ethmoidal air cells in left maxillary sinus. Soft tissues: Unremarkable. IMPRESSION: 1. Irregularity with heterogeneous enhancement about the left optic nerve, consistent with acute optic neuritis. 2. Few scattered subcentimeter FLAIR hyperintensities involving the right periventricular and juxtacortical white matter  as above. While these findings are nonspecific, possible early and/or mild demyelinating disease is considered given the acute optic neuritis. No abnormal enhancement. 3. Otherwise normal MRI of the brain and orbits. Electronically Signed   By: Rise Mu M.D.   On: 06/10/2019 19:00    Procedures Procedures (including critical care time)  Medications Ordered in ED Medications - No data to display  ED Course  I have reviewed the triage vital signs and the nursing notes.  Pertinent labs & imaging results  that were available during my care of the patient were reviewed by me and considered in my medical decision making (see chart for details).  24 year old female presents with blurry vision in the left eye for 9 days.  Patient was seen as an outpatient by ophthalmology and sent to the ED to rule out optic neuritis.  On exam patient is calm and cooperative.  No obvious abnormalities noted on exam.  Patient reports subjective blurry vision of the left eye.  Neurology was consulted and they are recommending MRI of the brain and orbits with and without contrast.  MRI confirms acute optic neuritis.  Will initiate IV steroids and admit to medicine.  MDM Rules/Calculators/A&P                       Final Clinical Impression(s) / ED Diagnoses Final diagnoses:  Optic neuritis    Rx / DC Orders ED Discharge Orders    None       Bethel Born, PA-C 06/10/19 Nelida Gores, MD 06/12/19 1740

## 2019-06-10 NOTE — Progress Notes (Signed)
Virtual Visit via Video Note  I connected with Tracy Mcconnell on 06/10/19 at  7:50 AM EST by a video enabled telemedicine application and verified that I am speaking with the correct person using two identifiers.  Location: Patient: home Provider: [facility]  I discussed the limitations of evaluation and management by telemedicine and the availability of in person appointments. The patient expressed understanding and agreed to proceed.  History of Present Illness: Patient is following up on her eye pain.  Still present.  Sharp pain with  EOM to the right and left in particular.  Deep pain into her eye.  She saw her neurology NP yesterday for her Botox who suggested a second opinion with an ophthalmologist.  In addition, suggested a lumbar puncture.    Review of Systems  Constitutional: Negative for chills, fever and malaise/fatigue.  HENT: Negative.   Eyes: Positive for blurred vision and pain. Negative for double vision, photophobia, discharge and redness.     Observations/Objective: General appearance: alert, well appearing, and in no distress. Mental Status: normal mood, behavior, speech, dress, motor activity, and thought processes.   Assessment and Plan:  1. Left eye complaint    I spoke with the patient this morning.  Followed up with [facility].  They are going to get her in at 10:45 AM for an appointment.  She was given these instructions.  I will also follow-up with her nurse practitioner neurology to determine if a lumbar puncture needs to be scheduled with them.   Follow Up Instructions:    I discussed the assessment and treatment plan with the patient. The patient was provided an opportunity to ask questions and all were answered. The patient agreed with the plan and demonstrated an understanding of the instructions.   The patient was advised to call back or seek an in-person evaluation if the symptoms worsen or if the condition fails to improve as  anticipated.  I provided 15 minutes of non-face-to-face time during this encounter.   Elyse Jarvis, NP

## 2019-06-10 NOTE — Progress Notes (Signed)
F/u on left eye. No change currently

## 2019-06-10 NOTE — Telephone Encounter (Signed)
I have spoken with Dr Dione Booze. Patient reported changes in vision over the past few weeks. Was seen by ophthalmology last week and given a normal report. I saw her for initial Botox procedure yesterday and she reported feeling fine. She was seen today by Dr Dione Booze who found concerns for optic neuritis with reduced visial field on exam. Patient is now in the ER for evaluation with MRI and possible IV steroids. I will follow up closely pending ER evaluation.

## 2019-06-10 NOTE — ED Notes (Signed)
To MRI

## 2019-06-10 NOTE — ED Provider Notes (Signed)
Patient signed out at end of shift by Terance Hart, PA-C. Here for further evaluation of blurred vision of left eye with pain, x 9 days Has seen optho, neuro, PCP, ER Went back to optho [employer]) today who was concerned for optic neuritis and sent her back to the ER MRI brain and orbits confirms diagnosis Neuro has seen her - needs steroids Will require admission for ongoing steroid therapy 1 gm solumedrol ordered COVID pending (send out)  Admit to unassigned.  [facility] admitting resident returned page for unassigned admission and accepts the patient onto his service.    Elpidio Anis, PA-C 06/10/19 1943    Tilden Fossa, MD 06/12/19 1430

## 2019-06-10 NOTE — ED Notes (Signed)
Pt given in-room phone to update boyfriend

## 2019-06-10 NOTE — ED Triage Notes (Signed)
Pt reports blurred vision to L eye x 9 days.  Seen by Dr. Dione Booze and sent to ED for suspected optic neuritis.  Pt to see neurohospitalist for MRI and likely admission for IV solumedrol.

## 2019-06-10 NOTE — Consult Note (Signed)
Neurology Consultation  Reason for Consult: Left eye optic neuritis Referring Physician: Pecola Leisure  CC: Optic neuritis and blurred vision in left eye  History is obtained from: Patient and notes  HPI: Tracy Mcconnell is a 24 y.o. female with history of migraines, depression, anxiety.  Patient had seen her ophthalmologist Dr. Dione Booze who did a dilated eye exam on her today and noted that she did have optic neuritis.  At that time, he did call the neurologist on-call who recommended she be sent to ED and was [facility] in which we will consult and obtain MRI brain and start patient on high-dose steroids.  Patient states that on Monday of last week she noted her eye was painful when moving.  At that time patient came to most [facility] urgent care for the blurred vision left eye.  While at urgent care she did have a ultrasound of the eye which was negative.  Patient was given Reglan Benadryl with a thought process this may be secondary to migraine however I vision did not change after these medications.  She was given referral to Dr. Wynelle Link ophthalmology.  After seeing Dr. Wynelle Link apparently she had a normal exam.  She then went to [facility] and saw nurse practitioner who then referred her to Dr. Dione Booze.  Patient saw Dr. Dione Booze this morning who did a dilated eye exam and noticed on the left eye she had optic neuritis.  Currently patient states that she does have eye pain with movement on her left eye.  She also states that she does have a dark cloud that seems to be over her left eye vision.  She clearly states that the central vision is worse than the peripheral vision.   ED course  Patient has been admitted to the ED and currently awaiting MRI of the cervical spine, brain, orbits with and without contrast    Past Medical History:  Diagnosis Date  . Anxiety   . Depression   . Hypovitaminosis D 04/07/2019  . Migraines     Family History  Problem Relation Age of Onset  . Migraines Mother   .  Thyroid disease Mother   . Cancer Maternal Grandmother   . Diabetes Paternal Grandfather   . Hypertension Maternal Grandfather   . Diabetes Paternal Grandmother   . Stroke Paternal Grandmother    Social History:   reports that she has never smoked. She has never used smokeless tobacco. She reports that she does not drink alcohol or use drugs.  Medications No current facility-administered medications for this encounter.  Current Outpatient Medications:  .  acetaminophen (TYLENOL) 500 MG tablet, Take 500 mg by mouth every 6 (six) hours as needed (pain)., Disp: , Rfl:  .  buPROPion (WELLBUTRIN XL) 300 MG 24 hr tablet, Take 300 mg by mouth daily., Disp: , Rfl:  .  busPIRone (BUSPAR) 30 MG tablet, Take 30 mg by mouth 2 (two) times daily., Disp: , Rfl:  .  doxycycline (VIBRAMYCIN) 100 MG capsule, Take 1 capsule (100 mg total) by mouth 2 (two) times daily for 14 days. (Patient not taking: Reported on 06/10/2019), Disp: 28 capsule, Rfl: 0 .  FLUoxetine (PROZAC) 40 MG capsule, Take 40 mg by mouth daily., Disp: , Rfl:  .  gabapentin (NEURONTIN) 300 MG capsule, TAKE 1 CAPSULE BY MOUTH THREE TIMES A DAY (Patient not taking: No sig reported), Disp: 90 capsule, Rfl: 1 .  hydrOXYzine (ATARAX/VISTARIL) 50 MG tablet, Take 50 mg by mouth 3 (three) times daily as needed  for anxiety. , Disp: , Rfl:  .  LORazepam (ATIVAN) 0.5 MG tablet, Take 0.5 mg by mouth every 8 (eight) hours as needed for anxiety. , Disp: , Rfl:  .  rizatriptan (MAXALT-MLT) 10 MG disintegrating tablet, Take 1 tablet (10 mg total) by mouth as needed for migraine. May repeat in 2 hours if needed, Disp: 9 tablet, Rfl: 11  ROS:    General ROS: negative for - chills, fatigue, fever, night sweats, weight gain or weight loss Psychological ROS: negative for - behavioral disorder, hallucinations, memory difficulties, mood swings or suicidal ideation Ophthalmic ROS: Positive for - blurry vision,  eye pain or loss of vision ENT ROS: negative for -  epistaxis, nasal discharge, oral lesions, sore throat, tinnitus or vertigo Allergy and Immunology ROS: negative for - hives or itchy/watery eyes Hematological and Lymphatic ROS: negative for - bleeding problems, bruising or swollen lymph nodes Endocrine ROS: negative for - galactorrhea, hair pattern changes, polydipsia/polyuria or temperature intolerance Respiratory ROS: negative for - cough, hemoptysis, shortness of breath or wheezing Cardiovascular ROS: negative for - chest pain, dyspnea on exertion, edema or irregular heartbeat Gastrointestinal ROS: negative for - abdominal pain, diarrhea, hematemesis, nausea/vomiting or stool incontinence Genito-Urinary ROS: negative for - dysuria, hematuria, incontinence or urinary frequency/urgency Musculoskeletal ROS: negative for - joint swelling or muscular weakness Neurological ROS: as noted in HPI Dermatological ROS: negative for rash and skin lesion changes  Exam: Current vital signs: BP (!) 123/94   Pulse 96   Temp 98.4 F (36.9 C) (Oral)   Resp 17   Ht 5' (1.524 m)   Wt 61.2 kg   LMP 05/27/2019   SpO2 95%   BMI 26.37 kg/m  Vital signs in last 24 hours: Temp:  [98.4 F (36.9 C)-99.5 F (37.5 C)] 98.4 F (36.9 C) (03/10 1430) Pulse Rate:  [77-96] 96 (03/10 1534) Resp:  [17] 17 (03/10 1430) BP: (119-126)/(85-102) 123/94 (03/10 1534) SpO2:  [95 %-100 %] 95 % (03/10 1534) Weight:  [61.2 kg] 61.2 kg (03/10 1552)   Constitutional: Appears well-developed and well-nourished.  Psych: Affect appropriate to situation Eyes: No scleral injection HENT: No OP obstrucion Head: Normocephalic.  Cardiovascular: Normal rate and regular rhythm.  Respiratory: Effort normal, non-labored breathing GI: Soft.  No distension. There is no tenderness.  Skin: WDI Neuro: Mental Status: Patient is awake, alert, oriented to person, place, month, year, and situation. Speech-intact naming, repeating, comprehension Able to follow commands Cranial  Nerves: II: Visual Fields are full with counting fingers however she did have difficulty with her left eye naming the color of my glove.  She was able to identify the color red, on the Snellen test she scored: OU 20/20 OS 20/100 III,IV, VI: EOMI without ptosis or diploplia. Pupils equal, round and sluggishly reactive to light 7 mm however she has recently had been dilated at the ophthalmologist's V: Facial sensation is symmetric to temperature VII: Facial movement is symmetric.  VIII: hearing is intact to voice X: Palat elevates symmetrically XI: Shoulder shrug is symmetric. XII: tongue is midline without atrophy or fasciculations.  Motor: Tone is normal. Bulk is normal. 5/5 strength was present in all four extremities.  Sensory: Sensation is symmetric to light touch and temperature in the arms and legs. Deep Tendon Reflexes: 2+ and symmetric in the biceps and patellae. Plantars: Toes are downgoing bilaterally.  Cerebellar: FNF and HKS are intact bilaterally  Labs I have reviewed labs in epic and the results pertinent to this consultation are:   CBC  Component Value Date/Time   WBC 9.7 06/10/2019 1527   RBC 4.84 06/10/2019 1527   HGB 14.4 06/10/2019 1527   HGB 13.4 02/04/2017 1541   HCT 42.3 06/10/2019 1527   HCT 39.9 02/04/2017 1541   PLT 393 06/10/2019 1527   PLT 392 (H) 02/04/2017 1541   MCV 87.4 06/10/2019 1527   MCV 85 02/04/2017 1541   MCH 29.8 06/10/2019 1527   MCHC 34.0 06/10/2019 1527   RDW 11.9 06/10/2019 1527   RDW 13.1 02/04/2017 1541   LYMPHSABS 2.0 06/10/2019 1527   MONOABS 0.8 06/10/2019 1527   EOSABS 0.2 06/10/2019 1527   BASOSABS 0.0 06/10/2019 1527    CMP     Component Value Date/Time   NA 137 06/03/2019 1502   NA 140 04/07/2019 1006   K 3.9 06/03/2019 1502   CL 106 06/03/2019 1502   CO2 21 (L) 06/03/2019 1502   GLUCOSE 90 06/03/2019 1502   BUN 9 06/03/2019 1502   BUN 8 04/07/2019 1006   CREATININE 0.80 06/03/2019 1502   CALCIUM 9.3  06/03/2019 1502   PROT 7.1 05/28/2019 1818   PROT 7.6 02/04/2017 1541   ALBUMIN 4.4 05/28/2019 1818   ALBUMIN 4.8 02/04/2017 1541   AST 24 05/28/2019 1818   ALT 23 05/28/2019 1818   ALKPHOS 80 05/28/2019 1818   BILITOT 0.3 05/28/2019 1818   BILITOT 0.5 02/04/2017 1541   GFRNONAA >60 06/03/2019 1502   GFRAA >60 06/03/2019 1502    Lipid Panel  No results found for: CHOL, TRIG, HDL, CHOLHDL, VLDL, LDLCALC, LDLDIRECT   Imaging I have reviewed the images obtained:  Felicie Morn PA-C [facility] [phone]  M-F  (9:00 am- 5:00 PM)  06/10/2019, 4:16 PM     Assessment:  This is a 24 year old female with over 1 week of decreased vision in the left eye.  Today she saw an ophthalmologist who did a dilated eye exam and saw optic neuritis.  As above, exam shows EMOI, OS 20/100, decreased central vision with no other neurological abnormalities.  Impression: -Decreased vision left eye -Optic neuritis  Recommendations: -MRI with and without contrast of head, orbits -Solu-Medrol 1 g for 3-5 days daily -Protonix 40 mg IV daily for gut protection  NEUROHOSPITALIST ADDENDUM Performed a face to face diagnostic evaluation.   I have reviewed the contents of history and physical exam as documented by PA/ARNP/Resident and agree with above documentation.  I have discussed and formulated the above plan as documented. Edits to the note have been made as needed.   24 year old female with painful vision loss that began Monday when she woke up in the morning.  Was seen by ophthalmologist Dr.Grout-showed papilledema concerning for optic neuritis.  Patient also states that she has a history of migraines and at times has mild numbness tingling in her arms.  She also has a aunt with history of MS.  MRI brain and MRI orbits with and without contrast shows left optic nerve enhancement consistent with acute optic neuritis.   MRI brain shows nonspecific FLAIR hyperintensities-not typical  for MS but needs to be closely monitored given optic neuritis.  No enhancing lesions.  Impression Optic neuritis Nonspecific T2 flair hyperintensities  Recommendations High-dose IV methylprednisolone 1 g into 3 to 5 days outpatient neurology follow-up     Fernando Stoiber MD [facility] [phone]   If 7pm to 7am, please call on call as listed on AMION.

## 2019-06-11 ENCOUNTER — Encounter (HOSPITAL_COMMUNITY): Payer: Self-pay | Admitting: Family Medicine

## 2019-06-11 ENCOUNTER — Inpatient Hospital Stay (HOSPITAL_COMMUNITY): Payer: Medicaid Other

## 2019-06-11 DIAGNOSIS — F411 Generalized anxiety disorder: Secondary | ICD-10-CM

## 2019-06-11 DIAGNOSIS — H538 Other visual disturbances: Secondary | ICD-10-CM

## 2019-06-11 DIAGNOSIS — H469 Unspecified optic neuritis: Secondary | ICD-10-CM

## 2019-06-11 LAB — BASIC METABOLIC PANEL
Anion gap: 11 (ref 5–15)
BUN: 9 mg/dL (ref 6–20)
CO2: 19 mmol/L — ABNORMAL LOW (ref 22–32)
Calcium: 9.2 mg/dL (ref 8.9–10.3)
Chloride: 106 mmol/L (ref 98–111)
Creatinine, Ser: 0.98 mg/dL (ref 0.44–1.00)
GFR calc Af Amer: >60 mL/min (ref >60)
GFR calc non Af Amer: >60 mL/min (ref >60)
Glucose, Bld: 191 mg/dL — ABNORMAL HIGH (ref 70–99)
Potassium: 3.6 mmol/L (ref 3.5–5.1)
Sodium: 136 mmol/L (ref 135–145)

## 2019-06-11 LAB — SARS CORONAVIRUS 2 (TAT 6-24 HRS): SARS Coronavirus 2: NEGATIVE

## 2019-06-11 MED ORDER — GABAPENTIN 300 MG PO CAPS
300.0000 mg | ORAL_CAPSULE | Freq: Three times a day (TID) | ORAL | Status: DC
  Administered 2019-06-11 – 2019-06-14 (×9): 300 mg via ORAL
  Filled 2019-06-11 (×9): qty 3

## 2019-06-11 MED ORDER — GADOBUTROL 1 MMOL/ML IV SOLN
6.0000 mL | Freq: Once | INTRAVENOUS | Status: AC | PRN
  Administered 2019-06-11: 6 mL via INTRAVENOUS

## 2019-06-11 MED ORDER — ELETRIPTAN HYDROBROMIDE 20 MG PO TABS
20.0000 mg | ORAL_TABLET | ORAL | Status: DC | PRN
  Administered 2019-06-11 – 2019-06-14 (×8): 20 mg via ORAL
  Filled 2019-06-11 (×12): qty 1

## 2019-06-11 MED ORDER — PANTOPRAZOLE SODIUM 40 MG PO TBEC
40.0000 mg | DELAYED_RELEASE_TABLET | Freq: Every day | ORAL | Status: DC
  Administered 2019-06-11 – 2019-06-13 (×3): 40 mg via ORAL
  Filled 2019-06-11 (×3): qty 1

## 2019-06-11 MED ORDER — SODIUM CHLORIDE 0.9 % IV SOLN
1000.0000 mg | Freq: Every day | INTRAVENOUS | Status: AC
  Administered 2019-06-11 – 2019-06-14 (×4): 1000 mg via INTRAVENOUS
  Filled 2019-06-11 (×4): qty 8

## 2019-06-11 NOTE — Progress Notes (Signed)
RN called just before signout stating that patient had new onset headache on the right and new vision changes on the right. This head ache pain is different than her history of migraines.  Went to examine patient and she is having headache from jaw up along right temple, onset sudden, however pain is minimal to moderate.  Patient reports feeling lightheaded, jaw pain, but able to move jaw.  Peripheral vision on right is now blurry, central vision spared.   No change in neuro exam from earlier today.  Signed out new issue to night team, they will monitor her overnight.  If worsens can consider repeat imaging, treating with tylenol.  Is currently on IV Solu-Medrol 1 g.  Shirlean Mylar, MD [facility], PGY-1

## 2019-06-11 NOTE — ED Notes (Signed)
Breakfast ordered 

## 2019-06-11 NOTE — ED Notes (Signed)
Lunch Tray Ordered @ 1041. 

## 2019-06-11 NOTE — Discharge Summary (Signed)
[facility] Discharge Summary  Patient name: Tracy Mcconnell Medical record number: [id number] Date of birth: 10-16-1995 Age: 24 y.o. Gender: female Date of Admission: 06/10/2019  Date of Discharge: 06/14/2019 Admitting Physician: Derrel Nip, MD  Primary Care Provider: Royal Hawthorn, NP Consultants: neuro  Indication for Hospitalization: optic neuritis  Discharge Diagnoses/Problem List:  Optic neuritis  Migraines Anxiety/depression  Disposition: home  Discharge Condition: stable  Discharge Exam:  General: well appearing, appears stated age Eye: Visual acuity 20/70 in L eye, 20/25 in R eye, 20/25 with both eyes Cardiac: RRR, no MRG Respiratory: CTAB, no rhonchi, rales, or wheezing, normal work of breathing Psych: appropriate mood and affect  Brief Hospital Course:  Patient presented with unilateral vision blurriness starting 3/1. On day of admission she presented to Dr. Dione Booze, ophthalmology, who visualized papilledema and sent pt to ED for evaluation. Pt was experiencing decreased peripheral vision on the left, and a darkening of left central vision. MR brain revealed scattered subcentimeter FLAIR hyperintensities involving the right periventricular and juxtacortical white matter as well as irregularity and heterogeneous enhancement of the left optic nerve, c/w acute optic neuritis. Full report below. Neurology recommended 5-day course of IV solumedrol 1g. Patient symptoms mildly improved to increased peripheral vision from 45 degrees to 60 degrees. Central vision of left eye brightened, but still foggy at time of discharge.  Visual acuity improved in L eye from 20/120 to 20/70 on discharge.  Patient has a history of upper extremity weakness that comes and goes. When it occurs it is unilateral, but has occurred on both the right and left before. Patient has an aunt with MS. According to McDonald Criteria, the MRI and optic neuritis count as one  event with history and objective evidence; the upper extremity symptoms are suspicious for history, but without objective evidence, differential includes MS, but no diagnosis yet.  MDD, GAD: patient has history of MDD and GAD and follows with psychiatry. In the hospita PHQ-9 score of 17, answered 0 on question 9. Score of 15 on GAD-7. This indicates that patient has severe depression and anxiety, but she states this is not an acute worsening and she is stable at the moment. Recommend continued follow up as an outpatient.  Issues for Follow Up:  1. Follow up with neurology for monitoring of other signs and symptoms increasing the likelihood for MS. 2. Follow up with ophthalmology to monitor vision. 3. Ensure patient continues 14-day total course of steroids 1 mg/kg daily.  Significant Procedures:  MR cervical spine, orbits, brain  Significant Labs and Imaging:  Recent Labs  Lab 06/10/19 1527 06/13/19 0257 06/14/19 0305  WBC 9.7 20.5* 16.3*  HGB 14.4 13.2 13.5  HCT 42.3 39.1 40.5  PLT 393 448* 428*   Recent Labs  Lab 06/10/19 1527 06/10/19 1527 06/11/19 0354 06/11/19 0354 06/12/19 0305 06/12/19 0305 06/13/19 0257 06/14/19 0305  NA 137  --  136  --  141  --  138 139  K 3.8   < > 3.6   < > 4.0   < > 3.9 4.2  CL 106  --  106  --  106  --  102 104  CO2 22  --  19*  --  23  --  26 24  GLUCOSE 90  --  191*  --  141*  --  135* 138*  BUN 6  --  9  --  10  --  12 12  CREATININE 0.77  --  0.98  --  0.80  --  0.77 0.83  CALCIUM 9.6  --  9.2  --  9.8  --  9.5 9.6   < > = values in this interval not displayed.   No results found. Results/Tests Pending at Time of Discharge: none  Discharge Medications:  Allergies as of 06/14/2019      Reactions   Sumatriptan Shortness Of Breath, Other (See Comments)   Entire body "felt heavy"   Other Itching, Swelling, Other (See Comments)   Pesticides (family history)- Exposure face and mouth itch and the tongue/lips swell      Medication List     TAKE these medications   acetaminophen 500 MG tablet Commonly known as: TYLENOL Take 500 mg by mouth every 6 (six) hours as needed (pain).   buPROPion 300 MG 24 hr tablet Commonly known as: WELLBUTRIN XL Take 300 mg by mouth in the morning.   busPIRone 30 MG tablet Commonly known as: BUSPAR Take 30 mg by mouth 2 (two) times daily.   FLUoxetine 40 MG capsule Commonly known as: PROZAC Take 40 mg by mouth in the morning.   gabapentin 300 MG capsule Commonly known as: NEURONTIN TAKE 1 CAPSULE BY MOUTH THREE TIMES A DAY What changed: See the new instructions.   hydrOXYzine 50 MG capsule Commonly known as: VISTARIL Take 50 mg by mouth at bedtime.   hydrOXYzine 50 MG tablet Commonly known as: ATARAX/VISTARIL Take 1 tablet (50 mg total) by mouth 3 (three) times daily as needed for anxiety.   LAVENDER OIL PO Take 1 capsule by mouth daily.   LORazepam 0.5 MG tablet Commonly known as: ATIVAN Take 0.25-0.5 mg by mouth every 6 (six) hours as needed for anxiety.   magnesium oxide 400 MG tablet Commonly known as: MAG-OX Take 400 mg by mouth daily.   predniSONE 20 MG tablet Commonly known as: Deltasone Take 3 tablets (60 mg total) by mouth daily for 11 days. Start taking on: June 15, 2019   predniSONE 20 MG tablet Commonly known as: Deltasone Take 2 tablets (40 mg total) by mouth daily for 2 days. Start taking on: June 27, 2019   predniSONE 20 MG tablet Commonly known as: Deltasone Take 1 tablet (20 mg total) by mouth daily for 2 days. Start taking on: June 29, 2019   rizatriptan 10 MG disintegrating tablet Commonly known as: MAXALT-MLT Take 1 tablet (10 mg total) by mouth as needed for migraine. May repeat in 2 hours if needed What changed:   reasons to take this  additional instructions       Discharge Instructions: Please refer to Patient Instructions section of EMR for full details.  Patient was counseled important signs and symptoms that should prompt  return to medical care, changes in medications, dietary instructions, activity restrictions, and follow up appointments.   Follow-Up Appointments: Follow-up Information    Warden Fillers, MD. Schedule an appointment as soon as possible for a visit.   Specialty: Ophthalmology Contact information: [address] [place] [address] [phone]           Kathrene Alu, MD 06/14/2019, 6:56 AM PGY-3, [place]

## 2019-06-11 NOTE — ED Notes (Signed)
Patient transported to MRI 

## 2019-06-11 NOTE — Progress Notes (Signed)
Family Medicine Teaching Service Daily Progress Note Intern Pager: [phone]  Patient name: Tracy Mcconnell Medical record number: [id number] Date of birth: October 06, 1995 Age: 24 y.o. Gender: female  Primary Care Provider: Royal Hawthorn, NP Consultants: Neurology Code Status: Full  Pt Overview and Major Events to Date:  3/10- admitted for optic neuritis confirmed by MRI  Assessment and Plan: Tracy Mcconnell is a 24 y.o. female presenting with changes in vision. PMH is significant for history of migraines.  Changes in vision due to acute optic neuritis Patient with continued darkening of central vision, peripheral vision decreased, but better than central. Peripheral vision on L eye at 45* angle. Full peripheral vision on the R eye. Also diminished sensation on the L side of the face. MRI with confirmed L optic neuritis. On IV solumedrol 1g x3-5d. Neurology following.  -Neuro consulted, appreciate recommendations -Solu-Medrol 1 g for 3-5 days daily -Protonix 40 mg PO for gut protection -Vitals per floor routine -Regular diet -Morning BMPs -CBC every 3 days -PT/OT eval and treat - f/u ANA and ds-DNA to rule out SLE  History of intermittent upper extremity weakness Patient reports history of intermittent unilateral upper extremity weakness, numbness, tingling which she has experienced in her right and left upper extremity.  She notices the weakness when she has difficulty gripping things.  Patient reports maternal aunt with MS diagnosis.  MRI showed subcentimeter FLAIR hyperintensities involving the right periventricular and juxtacortical white matter.  Per neurology, these MRI findings are not consistent with MS but that the patient should be monitored closely for MS given the optic neuritis diagnosis. Per McDonald's Criteria, patient has one qualifying episode in both time and space with this optic neuritis, and has history of other episodes with arm weakness, but no  objective data. MS diagnosis still on differential, will continue to follow and patient should continue to follow up with neurology outpatient. - Continue to monitor - Follow up with neurology outpatient  History of migraines Patient reports history of migraines.  She says that she was diagnosed with chronic persistent migraines.  Patient developing migraine this morning.  Home medications include rizatriptan 10 mg as needed for migraines. Rizatriptan not available in the hospital; she has an intolerance to sumatriptan, will give elatriptan as only other option in hospital. -Elatriptan for migraine  Anxiety and Depression Patient has history of anxiety and depression. Not endorsing depression and anxiety now and states she is well controlled on home medications. Will obtain PHQ-9 and GAD-7 today. - Cont Bupropion 300mg  once daily - Cont Buspirone 30mg  BID daily - Cont Fluoxetine 40mg  once daily - Cont Hyddroxyzine 50mg , TID prn - Cont Ativan 0.5mg , q8hr prn  FEN/GI: regular diet PPx: lovenox  Disposition: To home pending IV treatment and medical workup  Subjective:  Patient still has same symptoms from yesterday. Starting to get a migraine now, will give elatriptan.  Objective: Temp:  [98.2 F (36.8 C)-99.5 F (37.5 C)] 98.2 F (36.8 C) (03/11 0326) Pulse Rate:  [77-99] 97 (03/11 0326) Resp:  [14-17] 14 (03/11 0326) BP: (96-126)/(64-102) 102/76 (03/11 0326) SpO2:  [95 %-100 %] 98 % (03/11 0326) Weight:  [61.2 kg] 61.2 kg (03/10 1552) Physical Exam: General: young woman, resting comfortably in bed, NAD Cardiovascular: increased rate, regular rhythm, no m/r/g Respiratory: CTAB, no increased WOB, no wheezes, rales, rhonchi Abdomen: soft, NT, ND, normal bowel sounds present Extremities: warm, dry, no edema Neuro: Cranial Nerves: II: Visual Fields are full on R side, 45* peripheralvision on L. PERRL.  III,IV, VI: EOMI without ptosis or diplopia.  V: Facial sensation is  asymmetric to ouch, diminished on L side VII: Facial movement is symmetric.  VIII: hearing is intact to voice X: Palate elevates symmetrically XI: Shoulder shrug is symmetric. XII: tongue is midline without atrophy or fasciculations.  Motor: Tone is normal. Bulk is normal. 5/5 strength was present in all four extremities.  Sensory: Sensation is symmetric to light touch and temperature in the arms and legs.  Laboratory: Recent Labs  Lab 06/10/19 1527  WBC 9.7  HGB 14.4  HCT 42.3  PLT 393   Recent Labs  Lab 06/10/19 1527 06/11/19 0354  NA 137 136  K 3.8 3.6  CL 106 106  CO2 22 19*  BUN 6 9  CREATININE 0.77 0.98  CALCIUM 9.6 9.2  GLUCOSE 90 191*   Imaging/Diagnostic Tests: MR BRAIN W WO CONTRAST  Result Date: 06/10/2019 CLINICAL DATA:  Initial evaluation for suspected optic neuritis. EXAM: MRI HEAD AND ORBITS WITHOUT AND WITH CONTRAST TECHNIQUE: Multiplanar, multiecho pulse sequences of the brain and surrounding structures were obtained without and with intravenous contrast. Multiplanar, multiecho pulse sequences of the orbits and surrounding structures were obtained including fat saturation techniques, before and after intravenous contrast administration. CONTRAST:  78mL GADAVIST GADOBUTROL 1 MMOL/ML IV SOLN COMPARISON:  Prior MRI from 02/12/2017. FINDINGS: MRI HEAD FINDINGS Brain: Cerebral volume within normal limits. Single 7 mm focus of T2/FLAIR hyperintensities seen involving the periventricular white matter adjacent to the frontal horn of the right lateral ventricle (series 5, image 12), nonspecific. Additional subcentimeter juxta cortical FLAIR hyperintensity noted at the right parietal lobe (series 5, image 10). No other definite focal parenchymal signal abnormality. No evidence for acute or subacute infarct. Gray-white matter differentiation maintained. No encephalomalacia to suggest chronic cortical infarction. No evidence for acute or chronic intracranial hemorrhage. No mass  lesion, midline shift or mass effect. No hydrocephalus. No extra-axial fluid collection. Pituitary gland suprasellar region normal. Midline structures intact. No abnormal enhancement. Vascular: Major intracranial vascular flow voids are well maintained. Skull and upper cervical spine: Craniocervical junction within normal limits. Bone marrow signal intensity normal. No scalp soft tissue abnormality. Other: No mastoid effusion. MRI ORBITS FINDINGS Orbits: Globes are symmetric in size with normal morphology and appearance bilaterally. There is mild asymmetric enlargement and irregularity about the left optic nerve with heterogeneous postcontrast enhancement, suggesting acute optic neuritis (series 16, image 12). Involvement primarily consists of the intraorbital portion of the nerve. Right optic nerve normal in appearance without inflammation or enhancement. No abnormality about the orbital apices. Optic chiasm normally situated within the suprasellar cistern. Visualized optic radiations within normal limits. Extra-ocular muscles symmetric and normal. Lacrimal glands normal. Superior orbital veins within normal limits. No abnormality about the cavernous sinus. Visualized sinuses: Mild mucosal thickening noted within the ethmoidal air cells in left maxillary sinus. Soft tissues: Unremarkable. IMPRESSION: 1. Irregularity with heterogeneous enhancement about the left optic nerve, consistent with acute optic neuritis. 2. Few scattered subcentimeter FLAIR hyperintensities involving the right periventricular and juxtacortical white matter as above. While these findings are nonspecific, possible early and/or mild demyelinating disease is considered given the acute optic neuritis. No abnormal enhancement. 3. Otherwise normal MRI of the brain and orbits. Electronically Signed   By: Jeannine Boga M.D.   On: 06/10/2019 19:00   MR ORBITS W WO CONTRAST  Result Date: 06/10/2019 CLINICAL DATA:  Initial evaluation for  suspected optic neuritis. EXAM: MRI HEAD AND ORBITS WITHOUT AND WITH CONTRAST TECHNIQUE: Multiplanar, multiecho  pulse sequences of the brain and surrounding structures were obtained without and with intravenous contrast. Multiplanar, multiecho pulse sequences of the orbits and surrounding structures were obtained including fat saturation techniques, before and after intravenous contrast administration. CONTRAST:  38mL GADAVIST GADOBUTROL 1 MMOL/ML IV SOLN COMPARISON:  Prior MRI from 02/12/2017. FINDINGS: MRI HEAD FINDINGS Brain: Cerebral volume within normal limits. Single 7 mm focus of T2/FLAIR hyperintensities seen involving the periventricular white matter adjacent to the frontal horn of the right lateral ventricle (series 5, image 12), nonspecific. Additional subcentimeter juxta cortical FLAIR hyperintensity noted at the right parietal lobe (series 5, image 10). No other definite focal parenchymal signal abnormality. No evidence for acute or subacute infarct. Gray-white matter differentiation maintained. No encephalomalacia to suggest chronic cortical infarction. No evidence for acute or chronic intracranial hemorrhage. No mass lesion, midline shift or mass effect. No hydrocephalus. No extra-axial fluid collection. Pituitary gland suprasellar region normal. Midline structures intact. No abnormal enhancement. Vascular: Major intracranial vascular flow voids are well maintained. Skull and upper cervical spine: Craniocervical junction within normal limits. Bone marrow signal intensity normal. No scalp soft tissue abnormality. Other: No mastoid effusion. MRI ORBITS FINDINGS Orbits: Globes are symmetric in size with normal morphology and appearance bilaterally. There is mild asymmetric enlargement and irregularity about the left optic nerve with heterogeneous postcontrast enhancement, suggesting acute optic neuritis (series 16, image 12). Involvement primarily consists of the intraorbital portion of the nerve. Right  optic nerve normal in appearance without inflammation or enhancement. No abnormality about the orbital apices. Optic chiasm normally situated within the suprasellar cistern. Visualized optic radiations within normal limits. Extra-ocular muscles symmetric and normal. Lacrimal glands normal. Superior orbital veins within normal limits. No abnormality about the cavernous sinus. Visualized sinuses: Mild mucosal thickening noted within the ethmoidal air cells in left maxillary sinus. Soft tissues: Unremarkable. IMPRESSION: 1. Irregularity with heterogeneous enhancement about the left optic nerve, consistent with acute optic neuritis. 2. Few scattered subcentimeter FLAIR hyperintensities involving the right periventricular and juxtacortical white matter as above. While these findings are nonspecific, possible early and/or mild demyelinating disease is considered given the acute optic neuritis. No abnormal enhancement. 3. Otherwise normal MRI of the brain and orbits. Electronically Signed   By: Rise Mu M.D.   On: 06/10/2019 19:00   Shirlean Mylar, MD 06/11/2019, 6:30 AM PGY-1, [facility] Family Medicine FPTS Intern pager: [phone], text pages welcome

## 2019-06-12 LAB — BASIC METABOLIC PANEL
Anion gap: 12 (ref 5–15)
BUN: 10 mg/dL (ref 6–20)
CO2: 23 mmol/L (ref 22–32)
Calcium: 9.8 mg/dL (ref 8.9–10.3)
Chloride: 106 mmol/L (ref 98–111)
Creatinine, Ser: 0.8 mg/dL (ref 0.44–1.00)
GFR calc Af Amer: >60 mL/min (ref >60)
GFR calc non Af Amer: >60 mL/min (ref >60)
Glucose, Bld: 141 mg/dL — ABNORMAL HIGH (ref 70–99)
Potassium: 4 mmol/L (ref 3.5–5.1)
Sodium: 141 mmol/L (ref 135–145)

## 2019-06-12 LAB — NEUROMYELITIS OPTICA AUTOAB, IGG: NMO-IgG: <1.5 U/mL (ref 0.0–3.0)

## 2019-06-12 LAB — ANA W/REFLEX IF POSITIVE: Anti Nuclear Antibody (ANA): NEGATIVE

## 2019-06-12 LAB — ANTI-DNA ANTIBODY, DOUBLE-STRANDED: ds DNA Ab: 1 IU/mL (ref 0–9)

## 2019-06-12 MED ORDER — LORAZEPAM 0.5 MG PO TABS
0.5000 mg | ORAL_TABLET | Freq: Two times a day (BID) | ORAL | Status: DC | PRN
  Administered 2019-06-12 – 2019-06-13 (×2): 0.5 mg via ORAL
  Filled 2019-06-12 (×2): qty 1

## 2019-06-12 NOTE — Progress Notes (Addendum)
Family Medicine Teaching Service Daily Progress Note Intern Pager: [phone]  Patient name: Tracy Mcconnell Medical record number: [id number] Date of birth: 1996-01-31 Age: 24 y.o. Gender: female  Primary Care Provider: Wendall Mola, NP Consultants: Neurology Code Status: Full  Pt Overview and Major Events to Date:  3/10- admitted for optic neuritis confirmed by MRI  Assessment and Plan: Tracy Mcconnell is a 24 y.o. female presenting with changes in vision. PMH is significant for history of migraines.  Changes in vision due to acute optic neuritis Patient still has blurry vision on the left, central vision not as dark today. Peripheral vision on L eye mildly improved to 50-55* angle. Full peripheral vision on the R eye. Also diminished sensation on V2 portion of L side of the face. MRI with confirmed L optic neuritis. On IV solumedrol 1g x3-5d per neurology. Dr. Cheral Marker of neurology recommends 5d of IV steroids. Spoke with Dr. Midge Aver, ophthalmologist, yesterday; he recommended an oral taper according to ONTT: IV steroid x3d followed by oral prednisone (1mg /kg/d) x11d followed by a 4 day taper. -Neuro consulted, appreciate recommendations -Solu-Medrol 1 g for 5 days daily -Protonix 40 mg PO for gut protection -Vitals per floor routine -Regular diet -Morning BMPs -CBC every 3 days -PT/OT eval and treat - f/u ANA and ds-DNA pending -NMO ab pending  History of intermittent upper extremity weakness Patient reports history of intermittent unilateral upper extremity weakness, numbness, tingling which she has experienced in her right and left upper extremity.  She notices the weakness when she has difficulty gripping things.  Patient reports maternal aunt with MS diagnosis.  MRI showed subcentimeter FLAIR hyperintensities involving the right periventricular and juxtacortical white matter.  Per neurology, these MRI findings are not consistent with MS but that the  patient should be monitored closely for MS given the optic neuritis diagnosis. Per McDonald's Criteria, patient has one qualifying episode in both time and space with this optic neuritis, and has history of other episodes with arm weakness, but no objective data. MS diagnosis still on differential, will continue to follow and patient should continue to follow up with neurology outpatient. - Continue to monitor - Follow up with neurology outpatient  History of migraines Migraine present this morning, took elatriptan. Patient reports history of migraines.  She says that she was diagnosed with chronic persistent migraines.  -Elatriptan for migraine  Anxiety and Depression Patient has history of anxiety and depression. Not endorsing depression and anxiety now and states she is well controlled on home medications. Will obtain PHQ-9 and GAD-7 today. - Cont Bupropion 300mg  once daily - Cont Buspirone 30mg  BID daily - Cont Fluoxetine 40mg  once daily - Cont Hyddroxyzine 50mg , TID prn - Cont Ativan 0.5mg , q12hr prn  FEN/GI: regular diet PPx: lovenox  Disposition: To home pending IV treatment   Subjective:  Patient still has same symptoms from yesterday, improved . Starting to get a migraine now, received elatriptan.  Objective: Temp:  [98.2 F (36.8 C)-98.8 F (37.1 C)] 98.8 F (37.1 C) (03/11 2046) Pulse Rate:  [89-105] 105 (03/11 2046) Resp:  [16-20] 20 (03/11 1619) BP: (92-121)/(59-86) 117/62 (03/11 2046) SpO2:  [96 %-100 %] 100 % (03/11 2046) Physical Exam: General: young woman, resting comfortably in bed, NAD Cardiovascular: regular rate, regular rhythm, no m/r/g Respiratory: CTAB, no increased WOB, no wheezes, rales, rhonchi Abdomen: soft, NT, ND, normal bowel sounds present Extremities: warm, dry, no edema Neuro: Cranial Nerves: II: Visual Fields are full on R side, 50-55* peripheralvision on L.  PERRL.  III,IV, VI: EOMI without ptosis or diplopia.  V: Facial sensation is  asymmetric to ouch, diminished on L side VII: Facial movement is symmetric.  VIII: hearing is intact to voice X: Palate elevates symmetrically XI: Shoulder shrug is symmetric. XII: tongue is midline without atrophy or fasciculations.  Motor: Tone is normal. Bulk is normal. 5/5 strength was present in all four extremities.  Sensory: Sensation is symmetric to light touch in the arms and legs.  Laboratory: Recent Labs  Lab 06/10/19 1527  WBC 9.7  HGB 14.4  HCT 42.3  PLT 393   Recent Labs  Lab 06/10/19 1527 06/11/19 0354 06/12/19 0305  NA 137 136 141  K 3.8 3.6 4.0  CL 106 106 106  CO2 22 19* 23  BUN 6 9 10   CREATININE 0.77 0.98 0.80  CALCIUM 9.6 9.2 9.8  GLUCOSE 90 191* 141*   Imaging/Diagnostic Tests: MR CERVICAL SPINE W WO CONTRAST  Result Date: 06/11/2019 CLINICAL DATA:  Optic neuritis. Evaluate for demyelinating disease in the cervical spine. EXAM: MRI CERVICAL SPINE WITHOUT AND WITH CONTRAST TECHNIQUE: Multiplanar and multiecho pulse sequences of the cervical spine, to include the craniocervical junction and cervicothoracic junction, were obtained without and with intravenous contrast. CONTRAST:  52mL GADAVIST GADOBUTROL 1 MMOL/ML IV SOLN COMPARISON:  None. FINDINGS: Alignment: Physiologic. Vertebrae: No fracture, evidence of discitis, or bone lesion. Cord: Normal signal and morphology. No intradural enhancement. Posterior Fossa, vertebral arteries, paraspinal tissues: Minimal mucosal thickening of the maxillary sinuses and ethmoid air cells. Otherwise negative. Disc levels: Disc heights and hydration are preserved. No significant disc bulge or herniation. No spinal canal or neuroforaminal stenosis. IMPRESSION: Normal MRI of the cervical spine. No evidence of demyelinating disease. Electronically Signed   By: 11m M.D.   On: 06/11/2019 10:25   08/11/2019, MD 06/12/2019, 6:40 AM PGY-1, [facility] Family Medicine FPTS Intern pager: [phone], text pages  welcome

## 2019-06-12 NOTE — Plan of Care (Signed)

## 2019-06-13 LAB — BASIC METABOLIC PANEL
Anion gap: 10 (ref 5–15)
BUN: 12 mg/dL (ref 6–20)
CO2: 26 mmol/L (ref 22–32)
Calcium: 9.5 mg/dL (ref 8.9–10.3)
Chloride: 102 mmol/L (ref 98–111)
Creatinine, Ser: 0.77 mg/dL (ref 0.44–1.00)
GFR calc Af Amer: >60 mL/min (ref >60)
GFR calc non Af Amer: >60 mL/min (ref >60)
Glucose, Bld: 135 mg/dL — ABNORMAL HIGH (ref 70–99)
Potassium: 3.9 mmol/L (ref 3.5–5.1)
Sodium: 138 mmol/L (ref 135–145)

## 2019-06-13 LAB — CBC
HCT: 39.1 % (ref 36.0–46.0)
Hemoglobin: 13.2 g/dL (ref 12.0–15.0)
MCH: 29.9 pg (ref 26.0–34.0)
MCHC: 33.8 g/dL (ref 30.0–36.0)
MCV: 88.5 fL (ref 80.0–100.0)
Platelets: 448 10*3/uL — ABNORMAL HIGH (ref 150–400)
RBC: 4.42 MIL/uL (ref 3.87–5.11)
RDW: 12 % (ref 11.5–15.5)
WBC: 20.5 10*3/uL — ABNORMAL HIGH (ref 4.0–10.5)
nRBC: 0 % (ref 0.0–0.2)

## 2019-06-13 MED ORDER — IBUPROFEN 200 MG PO TABS
200.0000 mg | ORAL_TABLET | ORAL | Status: DC | PRN
  Administered 2019-06-13 (×2): 200 mg via ORAL
  Filled 2019-06-13 (×2): qty 1

## 2019-06-13 NOTE — Progress Notes (Addendum)
NEURO HOSPITALIST PROGRESS NOTE   Subjective: Patient awake, alert, NAD. She states that her vision is the same. Snellen chart testing has improved in her left eye. Still with color differentiation problems, more so with darker colors.   Exam: Vitals:   06/12/19 0757 06/12/19 2101  BP: 109/69 114/79  Pulse: 95 87  Resp: 20 14  Temp: 97.9 F (36.6 C) 98.3 F (36.8 C)  SpO2: 100% 99%    Physical Exam  Constitutional: Appears well-developed and well-nourished.  Psych: Affect appropriate to situation Eyes: Normal external eye and conjunctiva. HENT: Normocephalic, no lesions, without obvious abnormality.   Musculoskeletal-no joint tenderness, deformity or swelling Cardiovascular: Normal rate and regular rhythm.  Respiratory: Effort normal, non-labored breathing saturations WNL GI: Soft.  No distension. There is no tenderness.  Skin: WDI   Neuro:  Mental Status: Alert, oriented, thought content appropriate.  Speech fluent without evidence of aphasia.  Able to follow commands without difficulty. Cranial Nerves: II: Visual fields grossly normal, Snellen chart OS: 20/50 (improved). OS still has color perception problems with darker colors. OD: 20/20. PERRL.  III,IV, VI: ptosis not present, EOMI V,VII: smile symmetric, facial light touch sensation normal bilaterally VIII: hearing normal bilaterally IX,X: uvula rises symmetrically XI: bilateral shoulder shrug XII: midline tongue extension Motor: Right : Upper extremity   5/5  Left:     Upper extremity   5/5  Lower extremity   5/5  Lower extremity   5/5 Tone and bulk:normal tone throughout; no atrophy noted Sensory: Light touch intact throughout, bilaterally Deep Tendon Reflexes: 2+ and symmetric biceps, patellae Cerebellar: No ataxia noted Gait: Deferred     Medications:  Scheduled: . buPROPion  300 mg Oral Daily  . busPIRone  30 mg Oral BID  . enoxaparin (LOVENOX) injection  40 mg Subcutaneous Q24H   . FLUoxetine  40 mg Oral Daily  . gabapentin  300 mg Oral TID  . pantoprazole  40 mg Oral QHS   Continuous: . methylPREDNISolone (SOLU-MEDROL) injection 1,000 mg (06/12/19 0950)   UQJ:[id number], eletriptan, hydrOXYzine, LORazepam  Pertinent Labs/Diagnostics:   MR CERVICAL SPINE W WO CONTRAST  Result Date: 06/11/2019 CLINICAL DATA:  Optic neuritis. Evaluate for demyelinating disease in the cervical spine. EXAM: MRI CERVICAL SPINE WITHOUT AND WITH CONTRAST TECHNIQUE: Multiplanar and multiecho pulse sequences of the cervical spine, to include the craniocervical junction and cervicothoracic junction, were obtained without and with intravenous contrast. CONTRAST:  81mL GADAVIST GADOBUTROL 1 MMOL/ML IV SOLN COMPARISON:  None. FINDINGS: Alignment: Physiologic. Vertebrae: No fracture, evidence of discitis, or bone lesion. Cord: Normal signal and morphology. No intradural enhancement. Posterior Fossa, vertebral arteries, paraspinal tissues: Minimal mucosal thickening of the maxillary sinuses and ethmoid air cells. Otherwise negative. Disc levels: Disc heights and hydration are preserved. No significant disc bulge or herniation. No spinal canal or neuroforaminal stenosis. IMPRESSION: Normal MRI of the cervical spine. No evidence of demyelinating disease. Electronically Signed   By: Obie Dredge M.D.   On: 06/11/2019 10:25   Assessment:   24 year old female with painful vision loss that began Monday when she up woke up. She was seen by opthalmologist Dr, Dione Booze- showed papilledema concerning for optic neuritis.  -- Vision in left eye has improved from 20/100 to 20/50 with 4 days of IV methylprednisolone -- An episode of optic neuritis increases the risk of eventually developing MS. Will need outpatient Neurology follow  ups at regular intervals.   Recommendations:  -- Neurology outpatient f/u -- f/u outpatient with Ophthalmology as well -- Complete 5 day course of IV Solu-medrol 1 g daily. Last  dose on Sunday.  -- Continue protonix 40 mg IV for GI protection    Laurey Morale, MSN, NP-C [facility] [phone]   Electronically signed: Dr. Kerney Elbe 06/13/2019, 10:00 AM

## 2019-06-13 NOTE — Discharge Instructions (Signed)
While in the hospital you were treated for optic neuritis causing decreased vision.  You were put on IV steroids, we recommend you continue the oral regimen prescribed for you (see below).  Please follow-up with your neurologist and Dr. Dione Booze. Please make appointments within the next 1-2 weeks.  Steroid taper: 1. Please take prednisone 60 mg (3 pills) once a day for 11 days (3/15-3/26) 2. Please take prednisone 40 mg (2 pills) once a day for 2 days (3/27-3/28) 3. Please take prednisone 20 mg (1 pill) once a day for 2 days (3/29-3/30)

## 2019-06-13 NOTE — Progress Notes (Signed)
Family Medicine Teaching Service Daily Progress Note Intern Pager: [phone]  Patient name: Tracy Mcconnell Medical record number: [id number] Date of birth: [birth year] Age: 24 y.o. Gender: female  Primary Care Provider: Wendall Mola, NP Consultants: Neurology Code Status: Full  Pt Overview and Major Events to Date:  3/10- admitted for optic neuritis confirmed by MRI  Assessment and Plan: Tracy Mcconnell is a 24 y.o. female presenting with changes in vision. PMH is significant for history of migraines.  Changes in vision due to acute optic neuritis MRI with confirmed L optic neuritis. Dr. Cheral Marker of neurology recommends 5d of IV solumedrol. Spoke with Dr. Midge Aver, ophthalmologist, he recommended an oral taper according to ONTT: IV steroid x3d followed by oral prednisone (1mg /kg/d) x11d followed by a 4 day taper. Plan to receive 5th dose of IV solumedrol tomorrow morning, then d/c on oral regimen. -Neuro consulted, appreciate recommendations -Solu-Medrol 1 g for 5 days daily -Protonix 40 mg PO for gut protection -Vitals per floor routine -Regular diet -Morning BMPs -CBC every 3 days -PT/OT eval and treat - f/u ANA and ds-DNA pending -NMO ab pending  Leukocytosis On admission WBCs WNL at 9.2, today WBC 20.5. Patient is afebrile, VSS, no concern for infection. Although this is a large increase in leukocytes, she is on a very high dose of IV steroids and could be a reflection of demargination. -Continue to monitor  History of intermittent upper extremity weakness Patient reports history of intermittent unilateral upper extremity weakness, numbness, tingling which she has experienced in her right and left upper extremity.  She notices the weakness when she has difficulty gripping things.  Patient reports maternal aunt with MS diagnosis.  MRI showed subcentimeter FLAIR hyperintensities involving the right periventricular and juxtacortical white matter.  Per  neurology, these MRI findings are not consistent with MS but that the patient should be monitored closely for MS given the optic neuritis diagnosis. Per McDonald's Criteria, patient has one qualifying episode in both time and space with this optic neuritis, and has history of other episodes with arm weakness, but no objective data. MS diagnosis still on differential, will continue to follow and patient should continue to follow up with neurology outpatient. - Continue to monitor - Follow up with neurology outpatient  History of migraines Migraine present this morning, took elatriptan. Patient reports history of migraines.  She says that she was diagnosed with chronic persistent migraines.  -Elatriptan for migraine  Anxiety and Depression Patient has history of anxiety and depression. Not endorsing depression and anxiety now and states she is well controlled on home medications. PHQ-9 indicates severe depression with score of 17, GAD-7 indicates severe anxiety with score of 15. Patient answered 0 to question 9 on PHQ-9. No concern for SI. Although symptoms are severe, patient is well connected for behavioral health and has appropriate medication and therapy treatment. Recommend continued outpatient monitoring. - Cont Bupropion 300mg  once daily - Cont Buspirone 30mg  BID daily - Cont Fluoxetine 40mg  once daily - Cont Hyddroxyzine 50mg , TID prn - Cont Ativan 0.5mg , q12hr prn  FEN/GI: regular diet PPx: lovenox  Disposition: To home pending IV treatment   Subjective:  Patient with mild improvement today. Looking forward to going home tomorrow.  Objective: Temp:  [98.3 F (36.8 C)] 98.3 F (36.8 C) (03/12 2101) Pulse Rate:  [87] 87 (03/12 2101) Resp:  [14] 14 (03/12 2101) BP: (114)/(79) 114/79 (03/12 2101) SpO2:  [99 %] 99 % (03/12 2101) Physical Exam: General: pleasant young woman, resting comfortably in  bed, NAD Cardiovascular: regular rate, regular rhythm, no m/r/g Respiratory: CTAB,  no increased WOB, no wheezes, rales, rhonchi Abdomen: soft, NT, ND, normal bowel sounds present Extremities: warm, dry, no edema Neuro: Cranial Nerves: II: Visual Fields are full on R side, 55-60* peripheralvision on L. PERRL.  III,IV, VI: EOMI without ptosis or diplopia.  V: Facial sensation is asymmetric to ouch, diminished on L side VII: Facial movement is symmetric.  VIII: hearing is intact to voice X: Palate elevates symmetrically XI: Shoulder shrug is symmetric. XII: tongue is midline without atrophy or fasciculations.  Motor: Tone is normal. Bulk is normal. 5/5 strength was present in all four extremities.   Laboratory: Recent Labs  Lab 06/10/19 1527 06/13/19 0257  WBC 9.7 20.5*  HGB 14.4 13.2  HCT 42.3 39.1  PLT 393 448*   Recent Labs  Lab 06/11/19 0354 06/12/19 0305 06/13/19 0257  NA 136 141 138  K 3.6 4.0 3.9  CL 106 106 102  CO2 19* 23 26  BUN 9 10 12   CREATININE 0.98 0.80 0.77  CALCIUM 9.2 9.8 9.5  GLUCOSE 191* 141* 135*   Imaging/Diagnostic Tests: No results found. , MD 06/13/2019, 8:46 AM PGY-1, [facility] FPTS Intern pager: [phone], text pages welcome

## 2019-06-14 LAB — BASIC METABOLIC PANEL
Anion gap: 11 (ref 5–15)
BUN: 12 mg/dL (ref 6–20)
CO2: 24 mmol/L (ref 22–32)
Calcium: 9.6 mg/dL (ref 8.9–10.3)
Chloride: 104 mmol/L (ref 98–111)
Creatinine, Ser: 0.83 mg/dL (ref 0.44–1.00)
GFR calc Af Amer: >60 mL/min (ref >60)
GFR calc non Af Amer: >60 mL/min (ref >60)
Glucose, Bld: 138 mg/dL — ABNORMAL HIGH (ref 70–99)
Potassium: 4.2 mmol/L (ref 3.5–5.1)
Sodium: 139 mmol/L (ref 135–145)

## 2019-06-14 LAB — CBC
HCT: 40.5 % (ref 36.0–46.0)
Hemoglobin: 13.5 g/dL (ref 12.0–15.0)
MCH: 29.2 pg (ref 26.0–34.0)
MCHC: 33.3 g/dL (ref 30.0–36.0)
MCV: 87.7 fL (ref 80.0–100.0)
Platelets: 428 10*3/uL — ABNORMAL HIGH (ref 150–400)
RBC: 4.62 MIL/uL (ref 3.87–5.11)
RDW: 12 % (ref 11.5–15.5)
WBC: 16.3 10*3/uL — ABNORMAL HIGH (ref 4.0–10.5)
nRBC: 0 % (ref 0.0–0.2)

## 2019-06-14 MED ORDER — HYDROXYZINE HCL 50 MG PO TABS: 50.0000 mg | ORAL_TABLET | Freq: Three times a day (TID) | ORAL | Status: DC | PRN

## 2019-06-14 MED ORDER — PREDNISONE 20 MG PO TABS: 20.0000 mg | ORAL_TABLET | Freq: Every day | ORAL | 0 refills | Status: AC

## 2019-06-14 MED ORDER — PREDNISONE 20 MG PO TABS: 40.0000 mg | ORAL_TABLET | Freq: Every day | ORAL | 0 refills | Status: AC

## 2019-06-14 MED ORDER — PREDNISONE 20 MG PO TABS: 60.0000 mg | ORAL_TABLET | Freq: Every day | ORAL | 0 refills | Status: AC

## 2019-06-14 NOTE — Plan of Care (Signed)

## 2019-06-16 ENCOUNTER — Telehealth: Payer: Self-pay | Admitting: Family Medicine

## 2019-06-16 ENCOUNTER — Ambulatory Visit (HOSPITAL_COMMUNITY)
Admission: EM | Admit: 2019-06-16 | Discharge: 2019-06-16 | Disposition: A | Discharge status: Home / Self Care | Payer: Medicaid Other | Attending: Internal Medicine | Admitting: Internal Medicine

## 2019-06-16 ENCOUNTER — Encounter (HOSPITAL_COMMUNITY): Payer: Self-pay | Admitting: Emergency Medicine

## 2019-06-16 ENCOUNTER — Telehealth: Payer: Medicaid Other | Admitting: Physician Assistant

## 2019-06-16 ENCOUNTER — Other Ambulatory Visit: Payer: Self-pay

## 2019-06-16 DIAGNOSIS — Z20822 Contact with and (suspected) exposure to covid-19: Secondary | ICD-10-CM | POA: Insufficient documentation

## 2019-06-16 DIAGNOSIS — J018 Other acute sinusitis: Secondary | ICD-10-CM | POA: Diagnosis not present

## 2019-06-16 DIAGNOSIS — J111 Influenza due to unidentified influenza virus with other respiratory manifestations: Secondary | ICD-10-CM

## 2019-06-16 LAB — SARS CORONAVIRUS 2 (TAT 6-24 HRS): SARS Coronavirus 2: NEGATIVE

## 2019-06-16 MED ORDER — AMOXICILLIN 875 MG PO TABS: 875.0000 mg | ORAL_TABLET | Freq: Two times a day (BID) | ORAL | 0 refills | Status: AC

## 2019-06-16 NOTE — Progress Notes (Signed)
Based on what you shared with me, I feel your condition warrants further evaluation and I recommend that you be seen for a face to face office visit.  Given your recent hospitalization and length of symptoms it is best you have a face to face evaluation.  Your COVID test was negative at that time.   NOTE: If you entered your credit card information for this eVisit, you will not be charged. You may see a "hold" on your card for the $35 but that hold will drop off and you will not have a charge processed.   If you are having a true medical emergency please call 911.      For an urgent face to face visit, [facility] has five urgent care centers for your convenience:      NEW:  [facility] Health Urgent Care Center at [facility] Directions [phone] [address] [place], [place] [address] . 10 am - 6pm Monday - Friday    [facility] Health Urgent Care Center [facility]) Get Driving Directions [phone] [address] [place], [place] [address] . 10 am to 8 pm Monday-Friday . 12 pm to 8 pm [facility] Urgent Care at [facility] Get Driving Directions [phone] [address] [place], [place] [address] . 8 am to 8 pm Monday-Friday . 9 am to 6 pm Saturday . 11 am to 6 pm Sunday     [facility] Health Urgent Care at [facility] Get Driving Directions  [phone] [address] [place], [place] [address] . 8 am to 8 pm Monday-Friday . 8 am to 4 pm [facility] Urgent Care at [facility] Directions [phone] [address] [place], [place] [address] . 12 pm to 6 pm Monday-Friday      Your e-visit answers were reviewed by a board certified advanced clinical practitioner to complete your personal care plan.  Thank you for using e-Visits.    Greater than 5 minutes, yet less than 10 minutes of time have been spent researching, coordinating, and implementing care  for this patient today

## 2019-06-16 NOTE — Telephone Encounter (Signed)
Can you please help me get patient scheduled for a new MS patient as soon as possible visit with Dr Epimenio Foot, please. She is seen by Dr Lucia Gaskins and myself for headaches but recently diagnosed with optic neuritis. I have shared new diagnosis with Dr Lucia Gaskins and Dr Epimenio Foot. TY.

## 2019-06-16 NOTE — Telephone Encounter (Signed)
Ladona Ridgel- you can offer that slot for Wednesday next week, thank you

## 2019-06-16 NOTE — Discharge Instructions (Addendum)
Your COVID test is pending.  You should self quarantine until the test result is back.    Take Tylenol as needed for fever or discomfort.  Rest and keep yourself hydrated.    Go to the emergency department if you develop shortness of breath, severe diarrhea, high fever not relieved by Tylenol or ibuprofen, or other concerning symptoms.    I have sent in Amoxicillin to your pharmacy for you to take twice daily for 7 days. Follow up with no improvement in 2-3 days.

## 2019-06-16 NOTE — ED Triage Notes (Signed)
Pt here for URI sx x 1 week; pt recently treated for optic neuritis and was hospitalized for same

## 2019-06-17 ENCOUNTER — Ambulatory Visit: Payer: Medicaid Other | Admitting: Adult Health Nurse Practitioner

## 2019-06-17 VITALS — BP 123/85 | HR 101 | Temp 97.8°F | Ht 60.0 in | Wt 132.0 lb

## 2019-06-17 DIAGNOSIS — R519 Headache, unspecified: Secondary | ICD-10-CM

## 2019-06-17 DIAGNOSIS — H469 Unspecified optic neuritis: Secondary | ICD-10-CM | POA: Diagnosis not present

## 2019-06-17 DIAGNOSIS — H538 Other visual disturbances: Secondary | ICD-10-CM

## 2019-06-17 MED ORDER — DIAZEPAM 5 MG PO TABS: 5.0000 mg | ORAL_TABLET | Freq: Two times a day (BID) | ORAL | 0 refills | Status: AC | PRN

## 2019-06-17 MED ORDER — QUETIAPINE FUMARATE 50 MG PO TABS: ORAL_TABLET | ORAL | 0 refills | Status: DC

## 2019-06-17 NOTE — Telephone Encounter (Signed)
Noted  

## 2019-06-17 NOTE — Telephone Encounter (Signed)
Pt has called back and she accepted the appointment for 03-25, she is aware she is to check in at 1:00 for the 1:30 appointment

## 2019-06-17 NOTE — Patient Instructions (Signed)
° ° ° °  If you have lab work done today you will be contacted with your lab results within the next 2 weeks.  If you have not heard from us then please contact us. The fastest way to get your results is to register for My Chart. ° ° °IF you received an x-ray today, you will receive an invoice from [facility]. Please contact [facility] at [phone] with questions or concerns regarding your invoice.  ° °IF you received labwork today, you will receive an invoice from [facility]. Please contact [facility] at [phone] with questions or concerns regarding your invoice.  ° °Our billing staff will not be able to assist you with questions regarding bills from these companies. ° °You will be contacted with the lab results as soon as they are available. The fastest way to get your results is to activate your My Chart account. Instructions are located on the last page of this paperwork. If you have not heard from us regarding the results in 2 weeks, please contact this office. °  ° ° ° °

## 2019-06-17 NOTE — Progress Notes (Signed)
Chief Complaint  Patient presents with  . Hospitalization Follow-up    eye and also having lower back pain x1 day    HPI     Patient presents with 3 days of ocular pain.  Hurts with movement laterally and up and down.  Mild visual change. +headache.  Pain with looking either direction.  Does not feel like it involves her inner eye, rather the movement of the eye.  She is concerned for MS given her mom's recommendation and this is not unreasonable.   Problem List    Problem List:  2021-03: Left eye complaint 2021-03: Screening for cervical cancer 2021-01: Hypovitaminosis D 2020-07: Migraines 2018-11: Chronic daily headache Blurry vision Generalized anxiety disorder   Allergies   is allergic to sumatriptan and other.  Medications   Current Meds  Medication Sig  . acetaminophen (TYLENOL) 500 MG tablet Take 500 mg by mouth every 6 (six) hours as needed (pain).  . [EXPIRED] amoxicillin (AMOXIL) 875 MG tablet Take 1 tablet (875 mg total) by mouth 2 (two) times daily for 7 days.  . hydrOXYzine (VISTARIL) 50 MG capsule Take 50 mg by mouth at bedtime.  [name] LAVENDER OIL PO Take 1 capsule by mouth daily.  . magnesium oxide (MAG-OX) 400 MG tablet Take 400 mg by mouth daily.  . [EXPIRED] predniSONE (DELTASONE) 20 MG tablet Take 3 tablets (60 mg total) by mouth daily for 11 days.  . [EXPIRED] predniSONE (DELTASONE) 20 MG tablet Take 2 tablets (40 mg total) by mouth daily for 2 days.  . [EXPIRED] predniSONE (DELTASONE) 20 MG tablet Take 1 tablet (20 mg total) by mouth daily for 2 days.  . rizatriptan (MAXALT-MLT) 10 MG disintegrating tablet Take 1 tablet (10 mg total) by mouth as needed for migraine. May repeat in 2 hours if needed (Patient taking differently: Take 10 mg by mouth as needed for migraine (and may repeat once in 2 hours, if no relief (DISSOLVE IN THE MOUTH)). )  . [DISCONTINUED] buPROPion (WELLBUTRIN XL) 300 MG 24 hr tablet Take 300 mg by mouth in the morning.   .  [DISCONTINUED] busPIRone (BUSPAR) 30 MG tablet Take 30 mg by mouth 2 (two) times daily.  . [DISCONTINUED] FLUoxetine (PROZAC) 40 MG capsule Take 40 mg by mouth in the morning.   . [DISCONTINUED] gabapentin (NEURONTIN) 300 MG capsule TAKE 1 CAPSULE BY MOUTH THREE TIMES A DAY (Patient taking differently: Take 600 mg by mouth 3 (three) times daily. )  . [DISCONTINUED] hydrOXYzine (ATARAX/VISTARIL) 50 MG tablet Take 1 tablet (50 mg total) by mouth 3 (three) times daily as needed for anxiety.  . [DISCONTINUED] LORazepam (ATIVAN) 0.5 MG tablet Take 0.25-0.5 mg by mouth every 6 (six) hours as needed for anxiety.       Review of Systems    Constitutional: Negative for activity change, appetite change, chills and fever. + for fatigue HENT: Negative for congestion, nosebleeds, trouble swallowing and voice change.   Respiratory: Negative for cough, shortness of breath and wheezing.   Cardiac:  Negative for chest pain, pressure, syncope  Gastrointestinal: Negative for diarrhea, nausea and vomiting.  Genitourinary: Negative for difficulty urinating, dysuria, flank pain and hematuria.  Musculoskeletal: Negative for back pain, joint swelling and neck pain.  Neurological: + for extraocular mvmt pain, + for paresthesias  See HPI. All other review of systems negative.     Physical Exam:    height is 5' (1.524 m) and weight is 132 lb (59.9 kg). Her temporal temperature is 97.8 F (36.6  C). Her blood pressure is 123/85 and her pulse is 101 (abnormal). Her oxygen saturation is 98%.   Physical Examination: General appearance - alert, well appearing, and in no distress and oriented to person, place, and time Mental status - normal mood, behavior, speech, dress, motor activity, and thought processes Eyes -Eye exam - pupils equal and reactive, extraocular eye movements intact, sclera anicteric, no visual defects but pain noted laterally and medially. . Neck - supple, no significant adenopathy, carotids upstroke  normal bilaterally, no bruits, thyroid exam: thyroid is normal in size without nodules or tenderness Chest - clear to auscultation, no wheezes, rales or rhonchi, symmetric air entry  Heart - normal rate, regular rhythm, normal S1, S2, no murmurs, rubs, clicks or gallops Extremities - dependent LE edema without clubbing or cyanosis Skin - normal coloration and turgor, no rashes, no suspicious skin lesions noted  No hyperpigmentation of skin.  No current hematomas noted   Lab /Imaging Review   Reviewed.   Assessment & Plan:  Tanith Ramsey-Peralta is a 24 y.o. female    1. Optic neuritis    Orders Placed This Encounter  Procedures  . CBC with Differential/Platelet  . Thyroid Panel With TSH  . CMP14+EGFR  . VITAMIN D 25 Hydroxy (Vit-D Deficiency, Fractures)  . B12 and Folate Panel   Meds ordered this encounter  Medications  . diazepam (VALIUM) 5 MG tablet    Sig: Take 1 tablet (5 mg total) by mouth every 12 (twelve) hours as needed for anxiety or muscle spasms.    Dispense:  30 tablet    Refill:  0  . QUEtiapine (SEROQUEL) 50 MG tablet    Sig: 1/2 to 1 tab qhs for sleep    Dispense:  30 tablet    Refill:  0   I have called patient's neurologic provider to get her in urgently.    Glyn Ade, NP

## 2019-06-17 NOTE — Telephone Encounter (Signed)
Called pt- LVM for pt to call back to schedule a f/u from the hospital. When pt calls back please offer 3/25 at 1:30 with Dr. Epimenio Foot- per Kara Mead she has placed a hold on the appt time.

## 2019-06-18 LAB — CBC WITH DIFFERENTIAL/PLATELET
Basophils Absolute: 0 10*3/uL (ref 0.0–0.2)
Basos: 0 %
EOS (ABSOLUTE): 0 10*3/uL (ref 0.0–0.4)
Eos: 0 %
Hematocrit: 45.7 % (ref 34.0–46.6)
Hemoglobin: 15.8 g/dL (ref 11.1–15.9)
Immature Grans (Abs): 0.3 10*3/uL — ABNORMAL HIGH (ref 0.0–0.1)
Immature Granulocytes: 3 %
Lymphocytes Absolute: 1.2 10*3/uL (ref 0.7–3.1)
Lymphs: 12 %
MCH: 30 pg (ref 26.6–33.0)
MCHC: 34.6 g/dL (ref 31.5–35.7)
MCV: 87 fL (ref 79–97)
Monocytes Absolute: 0.2 10*3/uL (ref 0.1–0.9)
Monocytes: 3 %
Neutrophils Absolute: 8 10*3/uL — ABNORMAL HIGH (ref 1.4–7.0)
Neutrophils: 82 %
Platelets: 515 10*3/uL — ABNORMAL HIGH (ref 150–450)
RBC: 5.27 x10E6/uL (ref 3.77–5.28)
RDW: 12.5 % (ref 11.7–15.4)
WBC: 9.7 10*3/uL (ref 3.4–10.8)

## 2019-06-18 LAB — THYROID PANEL WITH TSH
Free Thyroxine Index: 2.7 (ref 1.2–4.9)
T3 Uptake Ratio: 30 % (ref 24–39)
T4, Total: 9 ug/dL (ref 4.5–12.0)
TSH: 0.496 u[IU]/mL (ref 0.450–4.500)

## 2019-06-18 LAB — CMP14+EGFR
ALT: 60 IU/L — ABNORMAL HIGH (ref 0–32)
AST: 43 IU/L — ABNORMAL HIGH (ref 0–40)
Albumin/Globulin Ratio: 1.8 (ref 1.2–2.2)
Albumin: 4.9 g/dL (ref 3.9–5.0)
Alkaline Phosphatase: 100 IU/L (ref 39–117)
BUN/Creatinine Ratio: 16 (ref 9–23)
BUN: 15 mg/dL (ref 6–20)
Bilirubin Total: 0.4 mg/dL (ref 0.0–1.2)
CO2: 22 mmol/L (ref 20–29)
Calcium: 10.1 mg/dL (ref 8.7–10.2)
Chloride: 96 mmol/L (ref 96–106)
Creatinine, Ser: 0.94 mg/dL (ref 0.57–1.00)
GFR calc Af Amer: 98 mL/min/{1.73_m2} (ref >59)
GFR calc non Af Amer: 85 mL/min/{1.73_m2} (ref >59)
Globulin, Total: 2.7 g/dL (ref 1.5–4.5)
Glucose: 104 mg/dL — ABNORMAL HIGH (ref 65–99)
Potassium: 4.5 mmol/L (ref 3.5–5.2)
Sodium: 136 mmol/L (ref 134–144)
Total Protein: 7.6 g/dL (ref 6.0–8.5)

## 2019-06-18 LAB — VITAMIN D 25 HYDROXY (VIT D DEFICIENCY, FRACTURES): Vit D, 25-Hydroxy: 27.6 ng/mL — ABNORMAL LOW (ref 30.0–100.0)

## 2019-06-18 LAB — B12 AND FOLATE PANEL
Folate: 7.8 ng/mL (ref >3.0)
Vitamin B-12: 553 pg/mL (ref 232–1245)

## 2019-06-19 ENCOUNTER — Telehealth: Payer: Medicaid Other | Admitting: Physician Assistant

## 2019-06-19 DIAGNOSIS — R05 Cough: Secondary | ICD-10-CM | POA: Diagnosis not present

## 2019-06-19 DIAGNOSIS — J069 Acute upper respiratory infection, unspecified: Secondary | ICD-10-CM

## 2019-06-19 DIAGNOSIS — R059 Cough, unspecified: Secondary | ICD-10-CM

## 2019-06-19 MED ORDER — BENZONATATE 100 MG PO CAPS: 100.0000 mg | ORAL_CAPSULE | Freq: Three times a day (TID) | ORAL | 0 refills | Status: DC | PRN

## 2019-06-19 MED ORDER — FLUTICASONE PROPIONATE 50 MCG/ACT NA SUSP: 2.0000 | Freq: Every day | NASAL | 0 refills | Status: DC

## 2019-06-19 MED ORDER — MUCINEX DM MAXIMUM STRENGTH 60-1200 MG PO TB12: 1.0000 | ORAL_TABLET | Freq: Two times a day (BID) | ORAL | 0 refills | Status: DC

## 2019-06-19 NOTE — Progress Notes (Signed)
We are sorry you are not feeling well.  Here is how we plan to help! Based on what you have shared with me, it looks like you may have a viral upper respiratory infection.    Read below regarding your treatment plan and prescription medications. Please schedule an appointment with your Primary Care Provider in the next few days to follow-up.  If your symptoms worsen, please go to an Urgent Care facility.      Upper respiratory infections are caused by a large number of viruses; however, rhinovirus is the most common cause.  Symptoms vary from person to person, with common symptoms including sore throat, cough, fatigue or lack of energy and feeling of general discomfort.  A low-grade fever of up to 100.4 may present, but is often uncommon.  Symptoms vary however, and are closely related to a person's age or underlying illnesses.  The most common symptoms associated with an upper respiratory infection are nasal discharge or congestion, cough, sneezing, headache and pressure in the ears and face.  These symptoms usually persist for about 3 to 10 days, but can last up to 2 weeks.  It is important to know that upper respiratory infections do not cause serious illness or complications in most cases.    Upper respiratory infections can be transmitted from person to person, with the most common method of transmission being a person's hands.  The virus is able to live on the skin and can infect other persons for up to 2 hours after direct contact.  Also, these can be transmitted when someone coughs or sneezes; thus, it is important to cover the mouth to reduce this risk.  To keep the spread of the illness at bay, good hand hygiene is very important.  This is an infection that is most likely caused by a virus. There are no specific treatments other than to help you with the symptoms until the infection runs its course.  We are sorry you are not feeling well.  Here is how we plan to help!   For nasal congestion,  you may use an oral decongestants such as Mucinex D or if you have glaucoma or high blood pressure use plain Mucinex.  Saline nasal spray or nasal drops can help and can safely be used as often as needed for congestion.  For your congestion, I have prescribed Fluticasone nasal spray one spray in each nostril twice a day  If you do not have a history of heart disease, hypertension, diabetes or thyroid disease, prostate/bladder issues or glaucoma, you may also use Sudafed to treat nasal congestion.  It is highly recommended that you consult with a pharmacist or your primary care physician to ensure this medication is safe for you to take.     If you have a cough, you may use cough suppressants such as Delsym and Robitussin.  If you have glaucoma or high blood pressure, you can also use Coricidin HBP.   For cough I have prescribed for you A prescription cough medication called Tessalon Perles 100 mg. You may take 1-2 capsules every 8 hours as needed for cough  If you have a sore or scratchy throat, use a saltwater gargle-  to  teaspoon of salt dissolved in a 4-ounce to 8-ounce glass of warm water.  Gargle the solution for approximately 15-30 seconds and then spit.  It is important not to swallow the solution.  You can also use throat lozenges/cough drops and Chloraseptic spray to help with throat pain  or discomfort.  Warm or cold liquids can also be helpful in relieving throat pain.  For headache, pain or general discomfort, you can use Ibuprofen or Tylenol as directed.   Some authorities believe that zinc sprays or the use of Echinacea may shorten the course of your symptoms.   HOME CARE . Only take medications as instructed by your medical team. . Be sure to drink plenty of fluids. Water is fine as well as fruit juices, sodas and electrolyte beverages. You may want to stay away from caffeine or alcohol. If you are nauseated, try taking small sips of liquids. How do you know if you are getting enough  fluid? Your urine should be a pale yellow or almost colorless. . Get rest. . Taking a steamy shower or using a humidifier may help nasal congestion and ease sore throat pain. You can place a towel over your head and breathe in the steam from hot water coming from a faucet. . Using a saline nasal spray works much the same way. . Cough drops, hard candies and sore throat lozenges may ease your cough. . Avoid close contacts especially the very young and the elderly . Cover your mouth if you cough or sneeze . Always remember to wash your hands.   GET HELP RIGHT AWAY IF: . You develop worsening fever. . If your symptoms do not improve within 10 days . You develop yellow or green discharge from your nose over 3 days. . You have coughing fits . You develop a severe head ache or visual changes. . You develop shortness of breath, difficulty breathing or start having chest pain . Your symptoms persist after you have completed your treatment plan  MAKE SURE YOU   Understand these instructions.  Will watch your condition.  Will get help right away if you are not doing well or get worse.  Your e-visit answers were reviewed by a board certified advanced clinical practitioner to complete your personal care plan. Depending upon the condition, your plan could have included both over the counter or prescription medications. Please review your pharmacy choice. If there is a problem, you may call our nursing hot line at and have the prescription routed to another pharmacy. Your safety is important to [place]. If you have drug allergies check your prescription carefully.   You can use MyChart to ask questions about today's visit, request a non-urgent call back, or ask for a work or school excuse for 24 hours related to this e-Visit. If it has been greater than 24 hours you will need to follow up with your provider, or enter a new e-Visit to address those concerns. You will get an e-mail in the next two days  asking about your experience.  I hope that your e-visit has been valuable and will speed your recovery. Thank you for using e-visits.     Greater than 5 minutes, yet less than 10 minutes of time have been spent researching, coordinating and implementing care for this patient today.

## 2019-06-24 DIAGNOSIS — H468 Other optic neuritis: Secondary | ICD-10-CM | POA: Diagnosis not present

## 2019-06-25 ENCOUNTER — Ambulatory Visit: Payer: Medicaid Other | Admitting: Neurology

## 2019-06-25 ENCOUNTER — Encounter: Payer: Self-pay | Admitting: Neurology

## 2019-06-25 ENCOUNTER — Other Ambulatory Visit: Payer: Self-pay

## 2019-06-25 VITALS — BP 120/79 | HR 88 | Temp 97.8°F | Ht 60.0 in | Wt 129.5 lb

## 2019-06-25 DIAGNOSIS — G43709 Chronic migraine without aura, not intractable, without status migrainosus: Secondary | ICD-10-CM | POA: Diagnosis not present

## 2019-06-25 DIAGNOSIS — H469 Unspecified optic neuritis: Secondary | ICD-10-CM

## 2019-06-25 DIAGNOSIS — R200 Anesthesia of skin: Secondary | ICD-10-CM | POA: Diagnosis not present

## 2019-06-25 DIAGNOSIS — R9082 White matter disease, unspecified: Secondary | ICD-10-CM

## 2019-06-25 DIAGNOSIS — H5213 Myopia, bilateral: Secondary | ICD-10-CM | POA: Diagnosis not present

## 2019-06-25 NOTE — Progress Notes (Signed)
[facility]  PATIENT: Tracy Mcconnell DOB: [birth year]  REFERRING DOCTOR OR PCP: Dr. Lucia Gaskins and neurology);  PCP is [facility] SOURCE: Patient, notes from neurology and hospital, imaging and lab reports, MRI images personally reviewed  _________________________________   HISTORICAL  CHIEF COMPLAINT:  Chief Complaint  Patient presents with  . New Patient (Initial Visit)    RM 12, alone. Internal referral for optic neuritis/MS. Was at [facility] 06/10/19-06/14/19. Had MRI brain/cervical/orbits completed- can be viewed in epic. She is still having difficulty with eyesight, has blurry vision in left eye only. This is constant. Received 5 days IV steroids. Now on oral prednisone. She saw opthalmologist yesterday and getting new presription glasses.  . Migraine    Takes Aimovig, magnesium, gabapentin, botox (received first dose this month)    HISTORY OF PRESENT ILLNESS:  I had the pleasure of seeing your patient, Tracy Mcconnell, at the MS center at [facility] for neurologic consultation regarding her recent optic neuritis and abnormal brain MRI  She is a 24 year old woman who had the onset of left eye blurry vision about a month ago.   The blurry vision was followed by pain, worse with eye movements.    She saw urgent care and then ophthalmology   She was felt to have optic neuritis and presented to the [facility] ED.   MRI of the brain and orbits showed enhancement of th left optic nerve,   There were a few T2 hyperintense foci in the hemispheres, including a periventricular focus in the right frontal lobe and one juxtacortical focus in the right parietal lobe.  She has a long history of migraine headaches and sees Dr. Lucia Gaskins.    She just switched from Aimovig to Botox and she feels she is doing better.      She has numbness and weakness in her legs that lasted a week, left > right.  This occurred about November 2019.  She gets intermittent in her  tongue and fingertips that is more fluctuating.     She also has had lower back pain that lasted about a week.    She has diplopia that has not changed and occurs when she reads at a certain distance.     She reports fatigue, worse when she has a busier day.   She feels better after resting.     She sleeps poorly some nights.   She reports depression and anxiety.      Bladder function is fine.      Her maternal aunt and two second cousins have MS.    REVIEW OF SYSTEMS: Constitutional: No fevers, chills, sweats, or change in appetite Eyes: No visual changes, double vision, eye pain Ear, nose and throat: No hearing loss, ear pain, nasal congestion, sore throat Cardiovascular: No chest pain, palpitations Respiratory: No shortness of breath at rest or with exertion.   No wheezes GastrointestinaI: No nausea, vomiting, diarrhea, abdominal pain, fecal incontinence Genitourinary: No dysuria, urinary retention or frequency.  No nocturia. Musculoskeletal: No neck pain, back pain Integumentary: No rash, pruritus, skin lesions Neurological: as above Psychiatric: No depression at this time.  No anxiety Endocrine: No palpitations, diaphoresis, change in appetite, change in weigh or increased thirst Hematologic/Lymphatic: No anemia, purpura, petechiae. Allergic/Immunologic: No itchy/runny eyes, nasal congestion, recent allergic reactions, rashes  ALLERGIES: Allergies  Allergen Reactions  . Sumatriptan Shortness Of Breath and Other (See Comments)    Entire body "felt heavy"  . Other Itching, Swelling and Other (See  Comments)    Pesticides (family history)- Exposure face and mouth itch and the tongue/lips swell    HOME MEDICATIONS:  Current Outpatient Medications:  .  acetaminophen (TYLENOL) 500 MG tablet, Take 500 mg by mouth every 6 (six) hours as needed (pain)., Disp: , Rfl:  .  benzonatate (TESSALON) 100 MG capsule, Take 1-2 capsules (100-200 mg total) by mouth 3 (three) times daily as  needed for cough., Disp: 40 capsule, Rfl: 0 .  buPROPion (WELLBUTRIN XL) 300 MG 24 hr tablet, Take 300 mg by mouth in the morning. , Disp: , Rfl:  .  busPIRone (BUSPAR) 30 MG tablet, Take 30 mg by mouth 2 (two) times daily., Disp: , Rfl:  .  Dextromethorphan-guaiFENesin (MUCINEX DM MAXIMUM STRENGTH) 60-1200 MG TB12, Take 1 tablet by mouth every 12 (twelve) hours., Disp: 6 tablet, Rfl: 0 .  diazepam (VALIUM) 5 MG tablet, Take 1 tablet (5 mg total) by mouth every 12 (twelve) hours as needed for anxiety or muscle spasms., Disp: 30 tablet, Rfl: 0 .  FLUoxetine (PROZAC) 40 MG capsule, Take 40 mg by mouth in the morning. , Disp: , Rfl:  .  fluticasone (FLONASE) 50 MCG/ACT nasal spray, Place 2 sprays into both nostrils daily., Disp: 16 g, Rfl: 0 .  gabapentin (NEURONTIN) 300 MG capsule, TAKE 1 CAPSULE BY MOUTH THREE TIMES A DAY (Patient taking differently: Take 600 mg by mouth 3 (three) times daily. ), Disp: 90 capsule, Rfl: 1 .  hydrOXYzine (ATARAX/VISTARIL) 50 MG tablet, Take 1 tablet (50 mg total) by mouth 3 (three) times daily as needed for anxiety., Disp: 30 tablet, Rfl:  .  hydrOXYzine (VISTARIL) 50 MG capsule, Take 50 mg by mouth at bedtime., Disp: , Rfl:  .  LAVENDER OIL PO, Take 1 capsule by mouth daily., Disp: , Rfl:  .  LORazepam (ATIVAN) 0.5 MG tablet, Take 0.25-0.5 mg by mouth every 6 (six) hours as needed for anxiety. , Disp: , Rfl:  .  magnesium oxide (MAG-OX) 400 MG tablet, Take 400 mg by mouth daily., Disp: , Rfl:  .  predniSONE (DELTASONE) 20 MG tablet, Take 3 tablets (60 mg total) by mouth daily for 11 days., Disp: 33 tablet, Rfl: 0 .  [START ON 06/27/2019] predniSONE (DELTASONE) 20 MG tablet, Take 2 tablets (40 mg total) by mouth daily for 2 days., Disp: 4 tablet, Rfl: 0 .  [START ON 06/29/2019] predniSONE (DELTASONE) 20 MG tablet, Take 1 tablet (20 mg total) by mouth daily for 2 days., Disp: 2 tablet, Rfl: 0 .  QUEtiapine (SEROQUEL) 50 MG tablet, 1/2 to 1 tab qhs for sleep, Disp: 30  tablet, Rfl: 0 .  rizatriptan (MAXALT-MLT) 10 MG disintegrating tablet, Take 1 tablet (10 mg total) by mouth as needed for migraine. May repeat in 2 hours if needed (Patient taking differently: Take 10 mg by mouth as needed for migraine (and may repeat once in 2 hours, if no relief (DISSOLVE IN THE MOUTH)). ), Disp: 9 tablet, Rfl: 11  PAST MEDICAL HISTORY: Past Medical History:  Diagnosis Date  . Anxiety   . Depression   . Hypovitaminosis D 04/07/2019  . Left eye complaint 06/08/2019  . Migraines     PAST SURGICAL HISTORY: Past Surgical History:  Procedure Laterality Date  . NO PAST SURGERIES      FAMILY HISTORY: Family History  Problem Relation Age of Onset  . Migraines Mother   . Thyroid disease Mother   . Cancer Maternal Grandmother   . Diabetes Paternal Grandfather   .  Hypertension Maternal Grandfather   . Diabetes Paternal Grandmother   . Stroke Paternal Grandmother     SOCIAL HISTORY:  Social History   Socioeconomic History  . Marital status: Significant Other    Spouse name: Not on file  . Number of children: 0  . Years of education: college student  . Highest education level: Not on file  Occupational History  . Not on file  Tobacco Use  . Smoking status: Never Smoker  . Smokeless tobacco: Never Used  Substance and Sexual Activity  . Alcohol use: No  . Drug use: No  . Sexual activity: Not Currently  Other Topics Concern  . Not on file  Social History Narrative   Lives at home alone   Right handed   Does not drink caffeine, makes her nauseated   Social Determinants of Health   Financial Resource Strain:   . Difficulty of Paying Living Expenses:   Food Insecurity:   . Worried About Programme researcher, broadcasting/film/video in the Last Year:   . Barista in the Last Year:   Transportation Needs:   . Freight forwarder (Medical):   [name] Lack of Transportation (Non-Medical):   Physical Activity:   . Days of Exercise per Week:   . Minutes of Exercise per Session:    Stress:   . Feeling of Stress :   Social Connections:   . Frequency of Communication with Friends and Family:   . Frequency of Social Gatherings with Friends and Family:   . Attends Religious Services:   . Active Member of Clubs or Organizations:   . Attends Banker Meetings:   [name] Marital Status:   Intimate Partner Violence:   . Fear of Current or Ex-Partner:   . Emotionally Abused:   [name] Physically Abused:   . Sexually Abused:      PHYSICAL EXAM  Vitals:   06/25/19 1314  BP: 120/79  Pulse: 88  Temp: 97.8 F (36.6 C)  Weight: 129 lb 8 oz (58.7 kg)  Height: 5' (1.524 m)    Body mass index is 25.29 kg/m.   Hearing Screening   125Hz  250Hz  500Hz  1000Hz  2000Hz  3000Hz  4000Hz  6000Hz  8000Hz   Right ear:           Left ear:             Visual Acuity Screening   Right eye Left eye Both eyes  Without correction: 20/40 20/50 20/30   With correction:       General: The patient is well-developed and well-nourished and in no acute distress  HEENT:  Head is Taylor/AT.  Sclera are anicteric.  Funduscopic exam shows normal optic discs and retinal vessels.  Skin: Extremities are without rash or  edema.   Neurologic Exam  Mental status: The patient is alert and oriented x 3 at the time of the examination. The patient has apparent normal recent and remote memory, with an apparently normal attention span and concentration ability.   Speech is normal.  Cranial nerves: Extraocular movements are full. Pupils show 1+ left APD.   colo.  Visual fields are full.  Facial symmetry is present. There is good facial sensation to soft touch bilaterally.Facial strength is normal.  Trapezius and sternocleidomastoid strength is normal. No dysarthria is noted.  The tongue is midline, and the patient has symmetric elevation of the soft palate. No obvious hearing deficits are noted.  Motor:  Muscle bulk is normal.   Tone is normal. Strength is  5 / 5 in all 4 extremities.   Sensory: Sensory  testing is intact to pinprick, soft touch and vibration sensation in all 4 extremities.  Coordination: Cerebellar testing reveals good finger-nose-finger and heel-to-shin bilaterally.  Gait and station: Station is normal.   Gait is normal. Tandem gait is normal. Romberg is negative.   Reflexes: Deep tendon reflexes are symmetric and normal bilaterally (2 in the arms and 3 in the legs).   Plantar responses are flexor.    DIAGNOSTIC DATA (LABS, IMAGING, TESTING) - I reviewed patient records, labs, notes, testing and imaging myself where available.  Lab Results  Component Value Date   WBC 9.7 06/17/2019   HGB 15.8 06/17/2019   HCT 45.7 06/17/2019   MCV 87 06/17/2019   PLT 515 (H) 06/17/2019      Component Value Date/Time   NA 136 06/17/2019 1546   K 4.5 06/17/2019 1546   CL 96 06/17/2019 1546   CO2 22 06/17/2019 1546   GLUCOSE 104 (H) 06/17/2019 1546   GLUCOSE 138 (H) 06/14/2019 0305   BUN 15 06/17/2019 1546   CREATININE 0.94 06/17/2019 1546   CALCIUM 10.1 06/17/2019 1546   PROT 7.6 06/17/2019 1546   ALBUMIN 4.9 06/17/2019 1546   AST 43 (H) 06/17/2019 1546   ALT 60 (H) 06/17/2019 1546   ALKPHOS 100 06/17/2019 1546   BILITOT 0.4 06/17/2019 1546   GFRNONAA 85 06/17/2019 1546   GFRAA 98 06/17/2019 1546   No results found for: CHOL, HDL, LDLCALC, LDLDIRECT, TRIG, CHOLHDL No results found for: VOZD6U Lab Results  Component Value Date   VITAMINB12 553 06/17/2019   Lab Results  Component Value Date   TSH 0.496 06/17/2019       ASSESSMENT AND PLAN  Optic neuritis - Plan: DG FL GUIDED LUMBAR PUNCTURE, Pan-ANCA, B. burgdorfi antibodies  White matter abnormality on MRI of brain - Plan: DG FL GUIDED LUMBAR PUNCTURE, Pan-ANCA, B. burgdorfi antibodies  Numbness - Plan: Pan-ANCA, B. burgdorfi antibodies  Chronic migraine without aura without status migrainosus, not intractable   In summary, Tracy Mcconnell is a 24 year old woman with left optic neuritis several  weeks ago who also has 2 flair/T2 weighted hyperintense foci on MRI, 1 of which occurred since her last MRI November 2018.  Her cervical spine did not show any abnormal foci.  Both of the foci on the current MRI are small and would be unlikely to cause any symptoms.  I discussed with her that optic neuritis at her age combined with an abnormal MRI makes MS a possible diagnosis.  I feel the likelihood that this is MS is about 75%.  Because she has migraine headaches, and the new focus is nonspecific, we do not know if it is demyelinating or related to the headaches.  In order to determine with more certainty if she has MS, we will set up a lumbar puncture to determine if she has oligoclonal bands and/or elevated IgG index.  If the CSF is abnormal, MS is more likely and I will bring her back in to discuss disease modifying therapies.  If the CSF is normal, I will want to recheck the MRI of the brain again in 6 months and, if negative, again about a year later to determine if there are changes over time.   I will also check ANCA and Lyme antibodies Western blot reflex.  She will return in 6 months if the spinal fluid is normal we will call sooner if new or worsening neurologic symptoms.  Thank you for asking me to see Tracy Mcconnell.   Tracy Mcconnell A. [name], MD, [facility] 09/27/3660, 9:47 PM Certified in Neurology, Clinical Neurophysiology, Sleep Medicine and Neuroimaging  [facility] [address], [place], [place] [address] [phone]

## 2019-06-26 DIAGNOSIS — Z20822 Contact with and (suspected) exposure to covid-19: Secondary | ICD-10-CM | POA: Diagnosis not present

## 2019-06-26 DIAGNOSIS — Z20828 Contact with and (suspected) exposure to other viral communicable diseases: Secondary | ICD-10-CM | POA: Diagnosis not present

## 2019-06-27 ENCOUNTER — Other Ambulatory Visit: Payer: Self-pay | Admitting: Diagnostic Neuroimaging

## 2019-06-29 LAB — PAN-ANCA
ANCA Proteinase 3: <3.5 U/mL (ref 0.0–3.5)
Atypical pANCA: <1:20 {titer}
C-ANCA: <1:20 {titer}
Myeloperoxidase Ab: <9 U/mL (ref 0.0–9.0)
P-ANCA: <1:20 {titer}

## 2019-06-29 LAB — B. BURGDORFI ANTIBODIES: Lyme IgG/IgM Ab: <0.91 {ISR} (ref 0.00–0.90)

## 2019-07-01 ENCOUNTER — Ambulatory Visit
Admission: RE | Admit: 2019-07-01 | Discharge: 2019-07-01 | Disposition: A | Discharge status: Home / Self Care | Payer: Medicaid Other | Source: Ambulatory Visit | Attending: Neurology | Admitting: Neurology

## 2019-07-01 ENCOUNTER — Other Ambulatory Visit: Payer: Self-pay

## 2019-07-01 VITALS — BP 144/65 | HR 70

## 2019-07-01 DIAGNOSIS — H469 Unspecified optic neuritis: Secondary | ICD-10-CM

## 2019-07-01 DIAGNOSIS — R9082 White matter disease, unspecified: Secondary | ICD-10-CM

## 2019-07-01 NOTE — Progress Notes (Signed)
Blood drawn from pts Right AC for labs to be sent off with LP labs by Donell Sievert, RN. 1 attempt, pt tolerated well, site clean and dry. Gauze and tape applied after blood draw.

## 2019-07-01 NOTE — Discharge Instructions (Signed)

## 2019-07-03 ENCOUNTER — Ambulatory Visit: Payer: Medicaid Other | Attending: Internal Medicine

## 2019-07-03 DIAGNOSIS — Z23 Encounter for immunization: Secondary | ICD-10-CM

## 2019-07-03 NOTE — Progress Notes (Signed)
   Covid-19 Vaccination Clinic  Name:  Tracy Mcconnell    MRN: [id number] DOB: [birth year]  07/03/2019  Tracy Mcconnell was observed post Covid-19 immunization for 15 minutes without incident. She was provided with Vaccine Information Sheet and instruction to access the V-Safe system.   Tracy Mcconnell was instructed to call 911 with any severe reactions post vaccine: [name] Difficulty breathing  . Swelling of face and throat  . A fast heartbeat  . A bad rash all over body  . Dizziness and weakness   Immunizations Administered    Name Date Dose VIS Date Route   Pfizer COVID-19 Vaccine 07/03/2019  2:00 PM 0.3 mL 03/13/2019 Intramuscular   Manufacturer: [employer], [employer]   Lot: [id number]   NDC: [address]-7

## 2019-07-07 DIAGNOSIS — H469 Unspecified optic neuritis: Secondary | ICD-10-CM | POA: Diagnosis not present

## 2019-07-07 NOTE — Telephone Encounter (Signed)
Called pt. Her sx are still present. Advised Dr. Epimenio Foot would like to bring her in for IV solumedrol. Lab for oligoclonal bands still pending, should be back today or tomorrow. Depending on result, we may do two more days of IV steroids. Provided orders to intrafusion. She will come today at 1pm.

## 2019-07-07 NOTE — Telephone Encounter (Addendum)
Called [facility] at [phone]. Spoke with [place]. She checked on status of oligoclonal bands. Lab is still pending. It was sent out 07/02/19 and takes 4-6 days to results. Should have result by today or tomorrow.  I relayed this to Dr. Epimenio Foot. He is going to review pt chart to determine next steps.

## 2019-07-08 DIAGNOSIS — H469 Unspecified optic neuritis: Secondary | ICD-10-CM | POA: Diagnosis not present

## 2019-07-08 NOTE — Telephone Encounter (Signed)
Called pt, advised no bands seen from oligoclonal bands lab test. Per Dr. Epimenio Foot, he would like her to still come for IV steroids today at 1pm and he will speak with her about next steps while she is here. She verbalized understanding.

## 2019-07-09 LAB — CSF CELL COUNT WITH DIFFERENTIAL
RBC Count, CSF: 4 cells/uL — ABNORMAL HIGH
WBC, CSF: 0 cells/uL (ref 0–5)

## 2019-07-09 LAB — CNS IGG SYNTHESIS RATE, CSF+BLOOD
Albumin Serum: 4.2 g/dL (ref 3.5–5.2)
Albumin, CSF: 17.7 mg/dL (ref 8.0–42.0)
CNS-IgG Synthesis Rate: -1 mg/24 h (ref <=3.3)
IgG (Immunoglobin G), Serum: 788 mg/dL (ref 600–1640)
IgG Total CSF: 1.9 mg/dL (ref 0.8–7.7)
IgG-Index: 0.57 (ref <0.66)

## 2019-07-09 LAB — OLIGOCLONAL BANDS, CSF + SERM

## 2019-07-09 LAB — GLUCOSE, CSF: Glucose, CSF: 66 mg/dL (ref 40–80)

## 2019-07-09 LAB — PROTEIN, CSF: Total Protein, CSF: 34 mg/dL (ref 15–45)

## 2019-07-09 LAB — VDRL, CSF: VDRL Quant, CSF: NONREACTIVE

## 2019-07-13 ENCOUNTER — Other Ambulatory Visit: Payer: Self-pay | Admitting: *Deleted

## 2019-07-13 DIAGNOSIS — R29898 Other symptoms and signs involving the musculoskeletal system: Secondary | ICD-10-CM

## 2019-07-13 DIAGNOSIS — R200 Anesthesia of skin: Secondary | ICD-10-CM

## 2019-07-14 ENCOUNTER — Other Ambulatory Visit: Payer: Self-pay | Admitting: Adult Health Nurse Practitioner

## 2019-07-14 DIAGNOSIS — F411 Generalized anxiety disorder: Secondary | ICD-10-CM

## 2019-07-14 MED ORDER — BUPROPION HCL ER (XL) 300 MG PO TB24: 300.0000 mg | ORAL_TABLET | Freq: Every morning | ORAL | 1 refills | Status: DC

## 2019-07-14 MED ORDER — QUETIAPINE FUMARATE 50 MG PO TABS: ORAL_TABLET | ORAL | 0 refills | Status: DC

## 2019-07-14 MED ORDER — HYDROXYZINE HCL 50 MG PO TABS: 50.0000 mg | ORAL_TABLET | Freq: Three times a day (TID) | ORAL | Status: DC | PRN

## 2019-07-14 MED ORDER — BUSPIRONE HCL 30 MG PO TABS: 30.0000 mg | ORAL_TABLET | Freq: Two times a day (BID) | ORAL | 1 refills | Status: AC

## 2019-07-14 MED ORDER — FLUOXETINE HCL 40 MG PO CAPS: 40.0000 mg | ORAL_CAPSULE | Freq: Every morning | ORAL | 1 refills | Status: DC

## 2019-07-14 MED ORDER — LORAZEPAM 0.5 MG PO TABS: 0.2500 mg | ORAL_TABLET | Freq: Four times a day (QID) | ORAL | 1 refills | Status: DC | PRN

## 2019-07-14 NOTE — Progress Notes (Signed)
Patient's insurance changed and needs a new psychiatrist.  Refilled patient's meds per request and placed order for referral.

## 2019-07-15 ENCOUNTER — Encounter: Payer: Self-pay | Admitting: *Deleted

## 2019-07-15 ENCOUNTER — Encounter: Payer: Self-pay | Admitting: Adult Health Nurse Practitioner

## 2019-07-15 ENCOUNTER — Telehealth: Payer: Self-pay | Admitting: *Deleted

## 2019-07-15 ENCOUNTER — Other Ambulatory Visit: Payer: Self-pay | Admitting: Adult Health Nurse Practitioner

## 2019-07-15 ENCOUNTER — Ambulatory Visit: Payer: Medicaid Other | Admitting: Adult Health Nurse Practitioner

## 2019-07-15 DIAGNOSIS — E559 Vitamin D deficiency, unspecified: Secondary | ICD-10-CM

## 2019-07-15 MED ORDER — VITAMIN D (ERGOCALCIFEROL) 1.25 MG (50000 UNIT) PO CAPS: 50000.0000 [IU] | ORAL_CAPSULE | ORAL | 3 refills | Status: AC

## 2019-07-15 NOTE — Telephone Encounter (Signed)
Pa approved Quetiapine Fumarate end date 07/09/20  #PA [id number]   Id number [id number]  Patient informed

## 2019-07-22 ENCOUNTER — Telehealth: Payer: Medicaid Other | Admitting: Family

## 2019-07-22 DIAGNOSIS — M5442 Lumbago with sciatica, left side: Secondary | ICD-10-CM

## 2019-07-22 MED ORDER — NAPROXEN 500 MG PO TABS: 500.0000 mg | ORAL_TABLET | Freq: Two times a day (BID) | ORAL | 0 refills | Status: DC

## 2019-07-22 MED ORDER — PREDNISONE 10 MG (21) PO TBPK: ORAL_TABLET | ORAL | 0 refills | Status: DC

## 2019-07-22 MED ORDER — BACLOFEN 10 MG PO TABS: 10.0000 mg | ORAL_TABLET | Freq: Three times a day (TID) | ORAL | 0 refills | Status: DC

## 2019-07-22 NOTE — Progress Notes (Signed)

## 2019-07-24 ENCOUNTER — Encounter: Payer: Self-pay | Admitting: Adult Health Nurse Practitioner

## 2019-07-24 ENCOUNTER — Other Ambulatory Visit: Payer: Self-pay

## 2019-07-24 ENCOUNTER — Ambulatory Visit (INDEPENDENT_AMBULATORY_CARE_PROVIDER_SITE_OTHER): Payer: Medicaid Other | Admitting: Adult Health Nurse Practitioner

## 2019-07-24 VITALS — BP 102/75 | HR 85 | Temp 98.0°F | Ht 60.0 in | Wt 126.8 lb

## 2019-07-24 DIAGNOSIS — F411 Generalized anxiety disorder: Secondary | ICD-10-CM | POA: Diagnosis not present

## 2019-07-24 DIAGNOSIS — M5416 Radiculopathy, lumbar region: Secondary | ICD-10-CM | POA: Diagnosis not present

## 2019-07-24 DIAGNOSIS — R200 Anesthesia of skin: Secondary | ICD-10-CM

## 2019-07-24 MED ORDER — METHYLPREDNISOLONE 4 MG PO TBPK: ORAL_TABLET | ORAL | 0 refills | Status: DC

## 2019-07-24 MED ORDER — BACLOFEN 20 MG PO TABS: 20.0000 mg | ORAL_TABLET | Freq: Three times a day (TID) | ORAL | 0 refills | Status: DC

## 2019-07-24 MED ORDER — GABAPENTIN 600 MG PO TABS: 600.0000 mg | ORAL_TABLET | Freq: Three times a day (TID) | ORAL | 0 refills | Status: DC

## 2019-07-24 NOTE — Patient Instructions (Signed)
° ° ° °  If you have lab work done today you will be contacted with your lab results within the next 2 weeks.  If you have not heard from us then please contact us. The fastest way to get your results is to register for My Chart. ° ° °IF you received an x-ray today, you will receive an invoice from [facility]. Please contact  Radiology at [phone] with questions or concerns regarding your invoice.  ° °IF you received labwork today, you will receive an invoice from [facility]. Please contact [facility] at [phone] with questions or concerns regarding your invoice.  ° °Our billing staff will not be able to assist you with questions regarding bills from these companies. ° °You will be contacted with the lab results as soon as they are available. The fastest way to get your results is to activate your My Chart account. Instructions are located on the last page of this paperwork. If you have not heard from us regarding the results in 2 weeks, please contact this office. °  ° ° ° °

## 2019-07-24 NOTE — Progress Notes (Signed)
Chief Complaint  Patient presents with  . Back Pain    on left side. pinch a nerve maybe since the pain goes down the left leg. Happened WED am      HPI   Patient presents with acute left sided back pain with radiation down the entire left lower extremity.  Not isolated to L5 or S1.  Recent dx of optic neuritis and MS.  No motor weakness.  No sensory deficit related to recent injury.  Per patient, she was dead lifting in gym which she is only recently restarting.  Felt an acute pain to left lumbar spine, then immediately stopped lifting.  Previously was into power lifting and had similar exacerbations in the past.    She has had no new recent exacerbations of MS symptoms.  Some intermittent pain with movement of eye still.  Appt with neurology in 2 weeks for EMG/NCS.    Has not received appt for new psychiatrist yet.  Will place referral again. Sleep is much better with Seroquel.  Anxiety controlled.      Problem List    Problem List: 2021-04: Left lumbar radiculitis 2021-04: Vitamin D deficiency 2021-03: White matter abnormality on MRI of brain 2021-03: Numbness 2021-03: Optic neuritis 2021-03: Left eye complaint 2021-03: Screening for cervical cancer 2021-01: Hypovitaminosis D 2020-07: Migraines 2020-07: Lumbar radiculopathy 2018-11: Chronic daily headache Blurry vision Generalized anxiety disorder   Allergies   is allergic to sumatriptan and other.  Medications    Current Outpatient Medications:  .  baclofen (LIORESAL) 10 MG tablet, Take 1 tablet (10 mg total) by mouth 3 (three) times daily., Disp: 30 each, Rfl: 0 .  buPROPion (WELLBUTRIN XL) 300 MG 24 hr tablet, Take 1 tablet (300 mg total) by mouth in the morning., Disp: 30 tablet, Rfl: 1 .  busPIRone (BUSPAR) 30 MG tablet, Take 1 tablet (30 mg total) by mouth 2 (two) times daily., Disp: 60 tablet, Rfl: 1 .  FLUoxetine (PROZAC) 40 MG capsule, Take 1 capsule (40 mg total) by mouth in the morning., Disp: 30 capsule,  Rfl: 1 .  fluticasone (FLONASE) 50 MCG/ACT nasal spray, Place 2 sprays into both nostrils daily., Disp: 16 g, Rfl: 0 .  hydrOXYzine (ATARAX/VISTARIL) 50 MG tablet, Take 1 tablet (50 mg total) by mouth 3 (three) times daily as needed for anxiety., Disp: 30 tablet, Rfl:  .  LAVENDER OIL PO, Take 1 capsule by mouth daily., Disp: , Rfl:  .  LORazepam (ATIVAN) 0.5 MG tablet, Take 0.5-1 tablets (0.25-0.5 mg total) by mouth every 6 (six) hours as needed for up to 30 doses for anxiety., Disp: 30 tablet, Rfl: 1 .  magnesium oxide (MAG-OX) 400 MG tablet, Take 400 mg by mouth daily., Disp: , Rfl:  .  naproxen (NAPROSYN) 500 MG tablet, Take 1 tablet (500 mg total) by mouth 2 (two) times daily with a meal., Disp: 60 tablet, Rfl: 0 .  predniSONE (STERAPRED UNI-PAK 21 TAB) 10 MG (21) TBPK tablet, Use as directed, Disp: 21 tablet, Rfl: 0 .  QUEtiapine (SEROQUEL) 50 MG tablet, 1/2 to 1 tab qhs for sleep, Disp: 30 tablet, Rfl: 0 .  rizatriptan (MAXALT-MLT) 10 MG disintegrating tablet, Take 1 tablet (10 mg total) by mouth as needed for migraine. May repeat in 2 hours if needed (Patient taking differently: Take 10 mg by mouth as needed for migraine (and may repeat once in 2 hours, if no relief (DISSOLVE IN THE MOUTH)). ), Disp: 9 tablet, Rfl: 11 .  Vitamin D, Ergocalciferol, (  DRISDOL) 1.25 MG (50000 UNIT) CAPS capsule, Take 1 capsule (50,000 Units total) by mouth every 7 (seven) days., Disp: 5 capsule, Rfl: 3 .  acetaminophen (TYLENOL) 500 MG tablet, Take 500 mg by mouth every 6 (six) hours as needed (pain)., Disp: , Rfl:  .  baclofen (LIORESAL) 20 MG tablet, Take 1 tablet (20 mg total) by mouth 3 (three) times daily., Disp: 30 each, Rfl: 0 .  gabapentin (NEURONTIN) 600 MG tablet, Take 1 tablet (600 mg total) by mouth 3 (three) times daily., Disp: 270 tablet, Rfl: 0 .  methylPREDNISolone (MEDROL DOSEPAK) 4 MG TBPK tablet, As directed on pkg label, Disp: 21 each, Rfl: 0   Review of Systems    Constitutional: Negative  for activity change, appetite change, chills and fever.  HENT: Negative for congestion, nosebleeds, trouble swallowing and voice change.   Respiratory: Negative for cough, shortness of breath and wheezing.   Cardiac:  Negative for chest pain, pressure, syncope  Gastrointestinal: Negative for diarrhea, nausea and vomiting.  Genitourinary: Negative for difficulty urinating, dysuria, flank pain and hematuria.  Musculoskeletal: Negative for back pain, joint swelling and neck pain.  Neurological: Negative for dizziness, speech difficulty, light-headedness and numbness.  See HPI. All other review of systems negative.     Physical Exam:    height is 5' (1.524 m) and weight is 126 lb 12.8 oz (57.5 kg). Her temporal temperature is 98 F (36.7 C). Her blood pressure is 102/75 and her pulse is 85. Her oxygen saturation is 96%.   Physical Examination: General appearance - alert, well appearing, and in no distress and oriented to person, place, and time Mental status - normal mood, behavior, speech, dress, motor activity, and thought processes Eyes - PERRL. Extraocular movements intact.  No nystagmus.  Neck - supple, no significant adenopathy, carotids upstroke normal bilaterally, no bruits, thyroid exam: thyroid is normal in size without nodules or tenderness Chest - clear to auscultation, no wheezes, rales or rhonchi, symmetric air entry  Heart - normal rate, regular rhythm, normal S1, S2, no murmurs, rubs, clicks or gallops Extremities - dependent LE edema without clubbing or cyanosis Skin - normal coloration and turgor, no rashes, no suspicious skin lesions noted  Neuro:  5/5 muscle strength in plantar flexion/extension, everstion, tib-ant, hip flexion and extension.   No hyperpigmentation of skin.  No current hematomas noted   Lab /Imaging Review      Assessment & Plan:  Tracy Mcconnell is a 24 y.o. female    1. Generalized anxiety disorder   2. Numbness   3. Left lumbar  radiculitis    No orders of the defined types were placed in this encounter.  Meds ordered this encounter  Medications  . methylPREDNISolone (MEDROL DOSEPAK) 4 MG TBPK tablet    Sig: As directed on pkg label    Dispense:  21 each    Refill:  0  . baclofen (LIORESAL) 20 MG tablet    Sig: Take 1 tablet (20 mg total) by mouth 3 (three) times daily.    Dispense:  30 each    Refill:  0  . gabapentin (NEURONTIN) 600 MG tablet    Sig: Take 1 tablet (600 mg total) by mouth 3 (three) times daily.    Dispense:  270 tablet    Refill:  0   No orders of the defined types were placed in this encounter.  Patient to f/u 6-12 weeks or as needed.  Verbalized understanding.    Elyse Jarvis, NP

## 2019-07-27 ENCOUNTER — Ambulatory Visit: Payer: Medicaid Other | Attending: Internal Medicine

## 2019-07-27 DIAGNOSIS — Z23 Encounter for immunization: Secondary | ICD-10-CM

## 2019-07-27 NOTE — Progress Notes (Signed)
   Covid-19 Vaccination Clinic  Name:  Tracy Mcconnell    MRN: [id number] DOB: 1995-04-05  07/27/2019  [name] was observed post Covid-19 immunization for 15 minutes without incident. She was provided with Vaccine Information Sheet and instruction to access the V-Safe system.   [name] was instructed to call 911 with any severe reactions post vaccine: [name] Difficulty breathing  . Swelling of face and throat  . A fast heartbeat  . A bad rash all over body  . Dizziness and weakness   Immunizations Administered    Name Date Dose VIS Date Route   Pfizer COVID-19 Vaccine 07/27/2019  2:04 PM 0.3 mL 05/27/2018 Intramuscular   Manufacturer: [employer], [employer]   Lot: [id number]   NDC: [address]-1

## 2019-08-06 ENCOUNTER — Other Ambulatory Visit: Payer: Self-pay | Admitting: *Deleted

## 2019-08-06 MED ORDER — GABAPENTIN 600 MG PO TABS: 600.0000 mg | ORAL_TABLET | Freq: Four times a day (QID) | ORAL | 0 refills | Status: DC

## 2019-08-11 ENCOUNTER — Encounter: Payer: Medicaid Other | Admitting: Neurology

## 2019-08-11 ENCOUNTER — Other Ambulatory Visit: Payer: Self-pay

## 2019-08-11 ENCOUNTER — Ambulatory Visit (INDEPENDENT_AMBULATORY_CARE_PROVIDER_SITE_OTHER): Payer: Medicaid Other | Admitting: Neurology

## 2019-08-11 DIAGNOSIS — R29898 Other symptoms and signs involving the musculoskeletal system: Secondary | ICD-10-CM

## 2019-08-11 DIAGNOSIS — R200 Anesthesia of skin: Secondary | ICD-10-CM | POA: Diagnosis not present

## 2019-08-11 DIAGNOSIS — Z0289 Encounter for other administrative examinations: Secondary | ICD-10-CM

## 2019-08-11 NOTE — Progress Notes (Signed)
Full Name: Jasmain Ramsey-Peralta Gender: Female MRN #: [id number] Date of Birth: 08-26-1995    Visit Date: 08/11/2019 07:20 Age: 24 Years Examining Physician: Arlice Colt, MD  Referring Physician: Arlice Colt, MD Height: 5 feet 0 inch    History: Ms. Leverett is a 24 year old woman with numbness and tingling in the arms and hands, left worse than right.  She had optic neuritis earlier this year and MRI of the brain showed 2 small foci that would likely be asymptomatic.  Cerebrospinal fluid was normal.  On exam she has normal strength and symmetric sensation.  Nerve conduction studies: Bilateral median and ulnar motor responses had normal distal latencies, amplitudes and conduction velocities.  Ulnar F-wave latencies were normal.  Bilateral median and ulnar sensory responses had normal peak latencies and amplitudes.  Electromyography: Needle EMG of selected muscles of the left arm was performed.  Motor unit morphology and recruitment was normal in all of the muscles tested.  There was no abnormal spontaneous activity.  Impression: This is a normal NCV/EMG study.  There is no evidence of mononeuropathy or radiculopathy.  Tomesha Sargent A. [name], MD, PhD, FAAN Certified in Neurology, Clinical Neurophysiology, Sleep Medicine, Pain Medicine and Neuroimaging Director, [place] [facility] [address] [place], [place] [address] [phone]   Verbal informed consent was obtained from the patient, patient was informed of potential risk of procedure, including bruising, bleeding, hematoma formation, infection, muscle weakness, muscle pain, numbness, among others.         [place]    Nerve / Sites Muscle Latency Ref. Amplitude Ref. Rel Amp Segments Distance Velocity Ref. Area    ms ms mV mV %  cm m/s m/s mVms  R Median - APB     Wrist APB 2.7 ?4.4 10.7 ?4.0 100 Wrist - APB 7   38.1     Upper arm  APB 5.9  10.5  98.6 Upper arm - Wrist 20 61 ?49 36.3  L Median - APB     Wrist APB 3.0 ?4.4 7.4 ?4.0 100 Wrist - APB 7   30.1     Upper arm APB 6.1  7.3  98.2 Upper arm - Wrist 19 61 ?49 29.4  R Ulnar - ADM     Wrist ADM 2.7 ?3.3 13.1 ?6.0 100 Wrist - ADM 7   36.7     B.Elbow ADM 5.4  12.0  91 B.Elbow - Wrist 17 63 ?49 34.8     A.Elbow ADM 6.9  11.7  97.6 A.Elbow - B.Elbow 10 63 ?49 34.9         A.Elbow - Wrist      L Ulnar - ADM     Wrist ADM 2.4 ?3.3 13.0 ?6.0 100 Wrist - ADM 7   36.7     B.Elbow ADM 4.8  12.7  97.7 B.Elbow - Wrist 16 65 ?49 35.7     A.Elbow ADM 6.5  12.5  98.4 A.Elbow - B.Elbow 10 61 ?49 35.6         A.Elbow - Wrist                 SNC    Nerve / Sites Rec. Site Peak Lat Ref.  Amp Ref. Segments Distance    ms ms V V  cm  R Median - Orthodromic (Dig II, Mid palm)     Dig II Wrist 2.6 ?3.4 26 ?10 Dig II -  Wrist 13  L Median - Orthodromic (Dig II, Mid palm)     Dig II Wrist 2.6 ?3.4 43 ?10 Dig II - Wrist 13  R Ulnar - Orthodromic, (Dig V, Mid palm)     Dig V Wrist 2.5 ?3.1 17 ?5 Dig V - Wrist 11  L Ulnar - Orthodromic, (Dig V, Mid palm)     Dig V Wrist 2.4 ?3.1 20 ?5 Dig V - Wrist 58             F  Wave    Nerve F Lat Ref.   ms ms  R Ulnar - ADM 24.3 ?32.0  L Ulnar - ADM 24.1 ?32.0            EMG Summary Table    Spontaneous MUAP Recruitment  Muscle IA Fib PSW Fasc Other Amp Dur. Poly Pattern  L. Deltoid Normal None None None _______ Normal Normal Normal Normal  L. Biceps brachii Normal None None None _______ Normal Normal Normal Normal  L. Triceps brachii Normal None None None _______ Normal Normal Normal Normal  L. Extensor digitorum communis Normal None None None _______ Normal Normal Normal Normal  L. Flexor carpi ulnaris Normal None None None  Normal Normal Normal Normal  L. First dorsal interosseous Normal None None None _______ Normal Normal Normal Normal

## 2019-08-19 DIAGNOSIS — H5213 Myopia, bilateral: Secondary | ICD-10-CM | POA: Diagnosis not present

## 2019-09-15 ENCOUNTER — Encounter: Payer: Self-pay | Admitting: Family Medicine

## 2019-09-17 ENCOUNTER — Other Ambulatory Visit: Payer: Self-pay | Admitting: *Deleted

## 2019-09-17 MED ORDER — RIZATRIPTAN BENZOATE 10 MG PO TBDP: 10.0000 mg | ORAL_TABLET | ORAL | 5 refills | Status: DC | PRN

## 2019-09-17 NOTE — Telephone Encounter (Signed)
Dr. Lucia Gaskins signed the PA request. I faxed it to [phone]).

## 2019-09-17 NOTE — Telephone Encounter (Signed)
I saw that patient's current Botox PA is with [employer]. In patient's chart, it shows that her only coverage is through [place]. I called patient to clarify and she states she no longer has [employer]. I told her that we would have to reschedule the 6/24 Botox appt that we just made with Amy, because we have to request a new PA through [facility]. They also require that we use [facility], which will take extra time. I moved patient's appt to 6/29 to allow extra time. She was okay with this. I filled out PA form for Medicaid and gave to the MD on call (Dr. Lucia Gaskins) to sign. NP is out of office this week.

## 2019-09-22 ENCOUNTER — Emergency Department (HOSPITAL_COMMUNITY)
Admission: EM | Admit: 2019-09-22 | Discharge: 2019-09-23 | Disposition: A | Discharge status: Home / Self Care | Payer: Medicaid Other | Attending: Emergency Medicine | Admitting: Emergency Medicine

## 2019-09-22 ENCOUNTER — Other Ambulatory Visit: Payer: Self-pay

## 2019-09-22 ENCOUNTER — Encounter (HOSPITAL_COMMUNITY): Payer: Self-pay | Admitting: Emergency Medicine

## 2019-09-22 DIAGNOSIS — Z5321 Procedure and treatment not carried out due to patient leaving prior to being seen by health care provider: Secondary | ICD-10-CM | POA: Insufficient documentation

## 2019-09-22 DIAGNOSIS — R0789 Other chest pain: Secondary | ICD-10-CM | POA: Insufficient documentation

## 2019-09-22 DIAGNOSIS — R112 Nausea with vomiting, unspecified: Secondary | ICD-10-CM | POA: Insufficient documentation

## 2019-09-22 DIAGNOSIS — R61 Generalized hyperhidrosis: Secondary | ICD-10-CM | POA: Diagnosis not present

## 2019-09-22 LAB — BASIC METABOLIC PANEL
Anion gap: 11 (ref 5–15)
BUN: 11 mg/dL (ref 6–20)
CO2: 24 mmol/L (ref 22–32)
Calcium: 9.5 mg/dL (ref 8.9–10.3)
Chloride: 104 mmol/L (ref 98–111)
Creatinine, Ser: 0.76 mg/dL (ref 0.44–1.00)
GFR calc Af Amer: >60 mL/min (ref >60)
GFR calc non Af Amer: >60 mL/min (ref >60)
Glucose, Bld: 121 mg/dL — ABNORMAL HIGH (ref 70–99)
Potassium: 3.5 mmol/L (ref 3.5–5.1)
Sodium: 139 mmol/L (ref 135–145)

## 2019-09-22 LAB — CBC
HCT: 43.4 % (ref 36.0–46.0)
Hemoglobin: 14.5 g/dL (ref 12.0–15.0)
MCH: 30.4 pg (ref 26.0–34.0)
MCHC: 33.4 g/dL (ref 30.0–36.0)
MCV: 91 fL (ref 80.0–100.0)
Platelets: 376 10*3/uL (ref 150–400)
RBC: 4.77 MIL/uL (ref 3.87–5.11)
RDW: 12.6 % (ref 11.5–15.5)
WBC: 13.2 10*3/uL — ABNORMAL HIGH (ref 4.0–10.5)
nRBC: 0 % (ref 0.0–0.2)

## 2019-09-22 LAB — I-STAT BETA HCG BLOOD, ED (MC, WL, AP ONLY): I-stat hCG, quantitative: <5 m[IU]/mL (ref <5)

## 2019-09-22 LAB — TROPONIN I (HIGH SENSITIVITY): Troponin I (High Sensitivity): <2 ng/L (ref <18)

## 2019-09-22 MED ORDER — SODIUM CHLORIDE 0.9% FLUSH: 3.0000 mL | Freq: Once | INTRAVENOUS | Status: DC

## 2019-09-22 NOTE — ED Triage Notes (Signed)
Patient arrives to ED by POV with complaints of substernal chest pain starting this morning. Patient stated she sudden went diaphoretic, nauseous, vomited, and had severe chest pain for about an hour.

## 2019-09-22 NOTE — Telephone Encounter (Signed)
Medicaid approved Botox PA request via [place] [facility] portal. PA #[id number]. Valid 09/19/19 to 03/17/20. I called patient to let her know, and advised her that [facility] would be calling her to get her consent for shipment. She verbalized understanding. I called [facility] after we hung up and set up patient's profile with Tammy Sours. He then transferred me to the pharmacist, Harrold Donath. I gave him a verbal prescription of (1) 200U vial (inject 155 units IM into the head and neck muscles every 3 months) with 1 refill. ([facility] [phone])

## 2019-09-24 ENCOUNTER — Encounter (HOSPITAL_COMMUNITY): Payer: Self-pay | Admitting: Emergency Medicine

## 2019-09-24 ENCOUNTER — Emergency Department (HOSPITAL_COMMUNITY)
Admission: EM | Admit: 2019-09-24 | Discharge: 2019-09-24 | Disposition: A | Discharge status: Home / Self Care | Payer: Medicaid Other | Attending: Emergency Medicine | Admitting: Emergency Medicine

## 2019-09-24 ENCOUNTER — Emergency Department (HOSPITAL_COMMUNITY): Payer: Medicaid Other

## 2019-09-24 ENCOUNTER — Other Ambulatory Visit: Payer: Self-pay

## 2019-09-24 ENCOUNTER — Ambulatory Visit: Payer: Medicaid Other | Admitting: Family Medicine

## 2019-09-24 DIAGNOSIS — K8080 Other cholelithiasis without obstruction: Secondary | ICD-10-CM

## 2019-09-24 DIAGNOSIS — K8020 Calculus of gallbladder without cholecystitis without obstruction: Secondary | ICD-10-CM | POA: Insufficient documentation

## 2019-09-24 DIAGNOSIS — K760 Fatty (change of) liver, not elsewhere classified: Secondary | ICD-10-CM | POA: Insufficient documentation

## 2019-09-24 DIAGNOSIS — R7989 Other specified abnormal findings of blood chemistry: Secondary | ICD-10-CM

## 2019-09-24 DIAGNOSIS — K8050 Calculus of bile duct without cholangitis or cholecystitis without obstruction: Secondary | ICD-10-CM | POA: Diagnosis not present

## 2019-09-24 DIAGNOSIS — R1011 Right upper quadrant pain: Secondary | ICD-10-CM | POA: Diagnosis present

## 2019-09-24 LAB — COMPREHENSIVE METABOLIC PANEL
ALT: 236 U/L — ABNORMAL HIGH (ref 0–44)
AST: 670 U/L — ABNORMAL HIGH (ref 15–41)
Albumin: 4.6 g/dL (ref 3.5–5.0)
Alkaline Phosphatase: 86 U/L (ref 38–126)
Anion gap: 8 (ref 5–15)
BUN: 10 mg/dL (ref 6–20)
CO2: 28 mmol/L (ref 22–32)
Calcium: 10 mg/dL (ref 8.9–10.3)
Chloride: 100 mmol/L (ref 98–111)
Creatinine, Ser: 0.68 mg/dL (ref 0.44–1.00)
GFR calc Af Amer: >60 mL/min (ref >60)
GFR calc non Af Amer: >60 mL/min (ref >60)
Glucose, Bld: 99 mg/dL (ref 70–99)
Potassium: 4 mmol/L (ref 3.5–5.1)
Sodium: 136 mmol/L (ref 135–145)
Total Bilirubin: 0.9 mg/dL (ref 0.3–1.2)
Total Protein: 7.7 g/dL (ref 6.5–8.1)

## 2019-09-24 LAB — URINALYSIS, ROUTINE W REFLEX MICROSCOPIC
Bilirubin Urine: NEGATIVE
Glucose, UA: NEGATIVE mg/dL
Ketones, ur: NEGATIVE mg/dL
Nitrite: NEGATIVE
Protein, ur: NEGATIVE mg/dL
Specific Gravity, Urine: 1.003 — ABNORMAL LOW (ref 1.005–1.030)
pH: 6 (ref 5.0–8.0)

## 2019-09-24 LAB — CBC
HCT: 42.9 % (ref 36.0–46.0)
Hemoglobin: 14.4 g/dL (ref 12.0–15.0)
MCH: 30.6 pg (ref 26.0–34.0)
MCHC: 33.6 g/dL (ref 30.0–36.0)
MCV: 91.3 fL (ref 80.0–100.0)
Platelets: 347 10*3/uL (ref 150–400)
RBC: 4.7 MIL/uL (ref 3.87–5.11)
RDW: 12.7 % (ref 11.5–15.5)
WBC: 11 10*3/uL — ABNORMAL HIGH (ref 4.0–10.5)
nRBC: 0 % (ref 0.0–0.2)

## 2019-09-24 LAB — LIPASE, BLOOD: Lipase: 55 U/L — ABNORMAL HIGH (ref 11–51)

## 2019-09-24 LAB — I-STAT BETA HCG BLOOD, ED (MC, WL, AP ONLY): I-stat hCG, quantitative: <5 m[IU]/mL (ref <5)

## 2019-09-24 MED ORDER — ONDANSETRON HCL 4 MG/2ML IJ SOLN
4.0000 mg | Freq: Once | INTRAMUSCULAR | Status: AC
  Administered 2019-09-24: 4 mg via INTRAVENOUS

## 2019-09-24 MED ORDER — SODIUM CHLORIDE 0.9 % IV BOLUS
1000.0000 mL | Freq: Once | INTRAVENOUS | Status: AC
  Administered 2019-09-24: 1000 mL via INTRAVENOUS

## 2019-09-24 MED ORDER — OXYCODONE HCL 5 MG PO TABS: 5.0000 mg | ORAL_TABLET | ORAL | 0 refills | Status: DC | PRN

## 2019-09-24 MED ORDER — SODIUM CHLORIDE 0.9 % IV BOLUS: 1000.0000 mL | Freq: Once | INTRAVENOUS | Status: DC

## 2019-09-24 MED ORDER — ONDANSETRON HCL 4 MG/2ML IJ SOLN
4.0000 mg | Freq: Once | INTRAMUSCULAR | Status: DC
  Filled 2019-09-24: qty 2

## 2019-09-24 MED ORDER — SODIUM CHLORIDE 0.9% FLUSH: 3.0000 mL | Freq: Once | INTRAVENOUS | Status: DC

## 2019-09-24 MED ORDER — MORPHINE SULFATE (PF) 4 MG/ML IV SOLN
4.0000 mg | Freq: Once | INTRAVENOUS | Status: DC
  Filled 2019-09-24: qty 1

## 2019-09-24 MED ORDER — HYDROCODONE-ACETAMINOPHEN 5-325 MG PO TABS: 2.0000 | ORAL_TABLET | Freq: Once | ORAL | Status: DC

## 2019-09-24 MED ORDER — MORPHINE SULFATE (PF) 4 MG/ML IV SOLN
4.0000 mg | Freq: Once | INTRAVENOUS | Status: AC
  Administered 2019-09-24: 4 mg via INTRAVENOUS

## 2019-09-24 NOTE — Discharge Instructions (Signed)
You have gallstones and your liver enzymes are elevated.  Please avoid taking Tylenol.  Take Motrin for pain and take oxycodone for severe pain.  Please repeat your liver enzyme tests in a week with surgery.  Follow-up with the surgeon.  We also send off acute hepatitis panel and you will be called if you have positive results.  Return to ER if you have severe abdominal pain, vomiting, fevers, dehydration.

## 2019-09-24 NOTE — ED Triage Notes (Signed)
Pt c/o upper abd pains since Tuesday. denies n/v/d. reports there is constant pain then in waves gets worse.

## 2019-09-24 NOTE — ED Provider Notes (Signed)
[facility] DEPT Provider Note   CSN: [id number] Arrival date & time: 09/24/19  1358     History No chief complaint on file.   Tracy Mcconnell is a 24 y.o. female history of anxiety, depression who presented with abdominal pain.  Patient states that she has intermittent right upper quadrant pain for the last 2 days.  Patient states that she initially was thought to have chest pain and called EMS and went to come and had unremarkable labs and left without being seen due to the wait time.  She initially felt better afterwards but then this morning, started having epigastric pain again is crampy in nature.  Patient denies any nausea vomiting or fevers.  Denies being pregnant.  Denies any urinary symptoms or flank pain.  The history is provided by the patient.       Past Medical History:  Diagnosis Date  . Anxiety   . Depression   . Hypovitaminosis D 04/07/2019  . Left eye complaint 06/08/2019  . Migraines     Patient Active Problem List   Diagnosis Date Noted  . Left lumbar radiculitis 07/24/2019  . Vitamin D deficiency 07/15/2019  . 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 Numbness 06/25/2019  . Blurry vision   . Generalized anxiety disorder   . Optic neuritis 06/10/2019  . Hypovitaminosis D 04/07/2019  . Migraines 10/28/2018  . Lumbar radiculopathy 10/28/2018  . Chronic daily headache 02/04/2017    Past Surgical History:  Procedure Laterality Date  . NO PAST SURGERIES       OB History   No obstetric history on file.     Family History  Problem Relation Age of Onset  . Migraines Mother   . Thyroid disease Mother   . Cancer Maternal Grandmother   . Diabetes Paternal Grandfather   . Hypertension Maternal Grandfather   . Diabetes Paternal Grandmother   . Stroke Paternal Grandmother     Social History   Tobacco Use  . Smoking status: Never Smoker  . Smokeless tobacco: Never Used  Vaping Use  . Vaping Use:  Never used  Substance Use Topics  . Alcohol use: No  . Drug use: No    Home Medications Prior to Admission medications   Medication Sig Start Date End Date Taking? Authorizing Provider  acetaminophen (TYLENOL) 500 MG tablet Take 500 mg by mouth every 6 (six) hours as needed (pain).    [provider]  baclofen (LIORESAL) 10 MG tablet Take 1 tablet (10 mg total) by mouth 3 (three) times daily. 07/22/19   Sharion Balloon, FNP  baclofen (LIORESAL) 20 MG tablet Take 1 tablet (20 mg total) by mouth 3 (three) times daily. 07/24/19   Wendall Mola, NP  buPROPion (WELLBUTRIN XL) 300 MG 24 hr tablet Take 1 tablet (300 mg total) by mouth in the morning. 07/14/19 08/13/19  Wendall Mola, NP  FLUoxetine (PROZAC) 40 MG capsule Take 1 capsule (40 mg total) by mouth in the morning. 07/14/19 08/13/19  Wendall Mola, NP  fluticasone Asencion Islam) 50 MCG/ACT nasal spray Place 2 sprays into both nostrils daily. 06/19/19   McVey, Gelene Mink, PA-C  gabapentin (NEURONTIN) 600 MG tablet Take 1 tablet (600 mg total) by mouth in the morning, at noon, in the evening, and at bedtime. 08/06/19 11/04/19  Sater, Nanine Means, MD  hydrOXYzine (ATARAX/VISTARIL) 50 MG tablet Take 1 tablet (50 mg total) by mouth 3 (three) times daily as needed for anxiety. 07/14/19  Wendall Mola, NP  LAVENDER OIL PO Take 1 capsule by mouth daily.    [provider]  LORazepam (ATIVAN) 0.5 MG tablet Take 0.5-1 tablets (0.25-0.5 mg total) by mouth every 6 (six) hours as needed for up to 30 doses for anxiety. 07/14/19   Wendall Mola, NP  magnesium oxide (MAG-OX) 400 MG tablet Take 400 mg by mouth daily.    [provider]  methylPREDNISolone (MEDROL DOSEPAK) 4 MG TBPK tablet As directed on pkg label 07/24/19   Wendall Mola, NP  naproxen (NAPROSYN) 500 MG tablet Take 1 tablet (500 mg total) by mouth 2 (two) times daily with a meal. 07/22/19   Sharion Balloon, FNP  predniSONE (STERAPRED UNI-PAK  21 TAB) 10 MG (21) TBPK tablet Use as directed 07/22/19   Sharion Balloon, FNP  QUEtiapine (SEROQUEL) 50 MG tablet 1/2 to 1 tab qhs for sleep 07/14/19   Wendall Mola, NP  rizatriptan (MAXALT-MLT) 10 MG disintegrating tablet Take 1 tablet (10 mg total) by mouth as needed for migraine (and may repeat once in 2 hours, if no relief (DISSOLVE IN THE MOUTH)). 09/17/19   Lomax, Amy, NP    Allergies    Sumatriptan and Other  Review of Systems   Review of Systems  Gastrointestinal: Positive for abdominal pain.  All other systems reviewed and are negative.   Physical Exam Updated Vital Signs BP 118/82   Pulse 70   Temp 98.7 F (37.1 C) (Oral)   Resp 18   LMP 09/16/2019   SpO2 99%   Physical Exam Vitals and nursing note reviewed.  Constitutional:      Comments: Slightly uncomfortable   HENT:     Head: Normocephalic.     Nose: Nose normal.     Mouth/Throat:     Mouth: Mucous membranes are moist.  Eyes:     Extraocular Movements: Extraocular movements intact.     Pupils: Pupils are equal, round, and reactive to light.  Cardiovascular:     Rate and Rhythm: Normal rate and regular rhythm.     Pulses: Normal pulses.  Pulmonary:     Effort: Pulmonary effort is normal.     Breath sounds: Normal breath sounds.  Abdominal:     General: Abdomen is flat.     Palpations: Abdomen is soft.     Comments: Mild RUQ tenderness, no rebound. No CVAT or periumbilical or lower abdominal tenderness   Musculoskeletal:        General: Normal range of motion.     Cervical back: Normal range of motion.  Skin:    General: Skin is warm.     Capillary Refill: Capillary refill takes less than 2 seconds.  Neurological:     General: No focal deficit present.     Mental Status: She is alert and oriented to person, place, and time.  Psychiatric:        Mood and Affect: Mood normal.        Behavior: Behavior normal.     ED Results / Procedures / Treatments   Labs (all labs ordered are listed,  but only abnormal results are displayed) Labs Reviewed  LIPASE, BLOOD - Abnormal; Notable for the following components:      Result Value   Lipase 55 (*)    All other components within normal limits  COMPREHENSIVE METABOLIC PANEL - Abnormal; Notable for the following components:   AST 670 (*)    ALT 236 (*)    All  other components within normal limits  CBC - Abnormal; Notable for the following components:   WBC 11.0 (*)    All other components within normal limits  URINALYSIS, ROUTINE W REFLEX MICROSCOPIC  HEPATITIS PANEL, ACUTE  I-STAT BETA HCG BLOOD, ED ([facility], [facility], [facility] ONLY)    EKG None  Radiology US Abdomen Limited  Result Date: 09/24/2019 CLINICAL DATA:  Right upper quadrant pain and elevated LFTs EXAM: ULTRASOUND ABDOMEN LIMITED RIGHT UPPER QUADRANT COMPARISON:  None. FINDINGS: Gallbladder: Gallbladder is contracted with multiple stones within. No pericholecystic fluid is noted. Negative sonographic Murphy's sign is elicited. Common bile duct: Diameter: 6 mm Liver: Mild increased echogenicity is noted consistent with fatty infiltration. No mass is seen. Portal vein is patent on color Doppler imaging with normal direction of blood flow towards the liver. Other: None. IMPRESSION: Fatty infiltration of the liver. Cholelithiasis without complicating factors. Electronically Signed   By: Inez Catalina M.D.   On: 09/24/2019 17:32    Procedures Procedures (including critical care time)  Medications Ordered in ED Medications  sodium chloride 0.9 % bolus 1,000 mL (1,000 mLs Intravenous New Bag/Given (Non-Interop) 09/24/19 2014)  ondansetron (ZOFRAN) injection 4 mg (4 mg Intravenous Given 09/24/19 2014)  morphine 4 MG/ML injection 4 mg (4 mg Intravenous Given 09/24/19 2015)    ED Course  I have reviewed the triage vital signs and the nursing notes.  Pertinent labs & imaging results that were available during my care of the patient were reviewed by me and considered in my medical decision  making (see chart for details).    MDM Rules/Calculators/A&P                          Tracy Mcconnell is a 24 y.o. female who presented with right upper quadrant pain.  Consider biliary colic versus pancreatitis versus gastritis.  Plan to get CBC, CMP, lipase, right upper quadrant ultrasound.  Will give IV fluids and pain medicine and reassess.  9:10 PM AST/ALT slightly elevated. Alk Phos and Bili nl.  Ultrasound showed cholelithiasis.  Pain is controlled.  Since her LFTs are elevated, will avoid Tylenol.  Will discharge her with Roxicodone and surgery follow-up.  Final Clinical Impression(s) / ED Diagnoses Final diagnoses:  None    Rx / DC Orders ED Discharge Orders    None       Drenda Freeze, MD 09/24/19 2111

## 2019-09-25 ENCOUNTER — Encounter: Payer: Self-pay | Admitting: Registered Nurse

## 2019-09-25 ENCOUNTER — Ambulatory Visit (INDEPENDENT_AMBULATORY_CARE_PROVIDER_SITE_OTHER): Payer: Medicaid Other | Admitting: Registered Nurse

## 2019-09-25 ENCOUNTER — Other Ambulatory Visit: Payer: Self-pay

## 2019-09-25 VITALS — BP 107/72 | HR 78 | Temp 97.9°F | Resp 18 | Ht 60.0 in | Wt 123.4 lb

## 2019-09-25 DIAGNOSIS — K8080 Other cholelithiasis without obstruction: Secondary | ICD-10-CM

## 2019-09-25 LAB — HEPATITIS PANEL, ACUTE
HCV Ab: NONREACTIVE
Hep A IgM: NONREACTIVE
Hep B C IgM: NONREACTIVE
Hepatitis B Surface Ag: NONREACTIVE

## 2019-09-25 NOTE — Patient Instructions (Signed)
° ° ° °  If you have lab work done today you will be contacted with your lab results within the next 2 weeks.  If you have not heard from us then please contact us. The fastest way to get your results is to register for My Chart. ° ° °IF you received an x-ray today, you will receive an invoice from [facility]. Please contact [facility] at [phone] with questions or concerns regarding your invoice.  ° °IF you received labwork today, you will receive an invoice from [facility]. Please contact [facility] at [phone] with questions or concerns regarding your invoice.  ° °Our billing staff will not be able to assist you with questions regarding bills from these companies. ° °You will be contacted with the lab results as soon as they are available. The fastest way to get your results is to activate your My Chart account. Instructions are located on the last page of this paperwork. If you have not heard from us regarding the results in 2 weeks, please contact this office. °  ° ° ° °

## 2019-09-28 NOTE — Telephone Encounter (Signed)
Jody with [facility] called and scheduled patient's Botox delivery for 7/1.

## 2019-09-29 ENCOUNTER — Ambulatory Visit: Payer: Medicaid Other | Admitting: Family Medicine

## 2019-09-29 DIAGNOSIS — H468 Other optic neuritis: Secondary | ICD-10-CM | POA: Diagnosis not present

## 2019-09-29 DIAGNOSIS — H47292 Other optic atrophy, left eye: Secondary | ICD-10-CM | POA: Diagnosis not present

## 2019-09-30 ENCOUNTER — Encounter: Payer: Self-pay | Admitting: Registered Nurse

## 2019-10-01 DIAGNOSIS — Z419 Encounter for procedure for purposes other than remedying health state, unspecified: Secondary | ICD-10-CM | POA: Diagnosis not present

## 2019-10-01 NOTE — Telephone Encounter (Signed)
I was out of the office today, but I called [facility] [phone] and spoke with Colin Mulders to confirm that Botox was delivered. She states Botox was delivered to our office today for patient's 7/7 appointment.

## 2019-10-02 ENCOUNTER — Encounter: Payer: Self-pay | Admitting: Registered Nurse

## 2019-10-02 ENCOUNTER — Other Ambulatory Visit: Payer: Self-pay | Admitting: Registered Nurse

## 2019-10-02 DIAGNOSIS — M549 Dorsalgia, unspecified: Secondary | ICD-10-CM

## 2019-10-02 MED ORDER — OXYCODONE HCL 5 MG PO TABS: 5.0000 mg | ORAL_TABLET | ORAL | 0 refills | Status: DC | PRN

## 2019-10-02 NOTE — Progress Notes (Signed)
Established Patient Office Visit  Subjective:  Patient ID: Tracy Mcconnell, female    DOB: 04-Dec-1995  Age: 24 y.o. MRN: [id number]  CC:  Chief Complaint  Patient presents with  . Chest Pain    patient states she was having chest and abdominal pain on tuesday morning but went to the ER because she couldnt wait and found out she had Gallstones and have surgery so she decided to come do a follow up.    HPI Cheyrl Ramsey-Peralta presents for ongoing abdominal pain  Assessed in ED, determined to be gallstones. Given date for surgery on 10/15/19 - hoping to have this moved sooner. Pain controlled with oxycodone for breakthrough pain. Appropriately she is not taking acetaminophen. She is aware of red flag symptoms   Past Medical History:  Diagnosis Date  . Anxiety   . Depression   . Hypovitaminosis D 04/07/2019  . Left eye complaint 06/08/2019  . Migraines     Past Surgical History:  Procedure Laterality Date  . NO PAST SURGERIES      Family History  Problem Relation Age of Onset  . Migraines Mother   . Thyroid disease Mother   . Cancer Maternal Grandmother   . Diabetes Paternal Grandfather   . Hypertension Maternal Grandfather   . Diabetes Paternal Grandmother   . Stroke Paternal Grandmother     Social History   Socioeconomic History  . Marital status: Significant Other    Spouse name: Not on file  . Number of children: 0  . Years of education: college student  . Highest education level: Not on file  Occupational History  . Not on file  Tobacco Use  . Smoking status: Never Smoker  . Smokeless tobacco: Never Used  Vaping Use  . Vaping Use: Never used  Substance and Sexual Activity  . Alcohol use: No  . Drug use: No  . Sexual activity: Not Currently  Other Topics Concern  . Not on file  Social History Narrative   Lives at home alone   Right handed   Does not drink caffeine, makes her nauseated   Social Determinants of Health   Financial Resource  Strain:   . Difficulty of Paying Living Expenses:   Food Insecurity:   . Worried About Programme researcher, broadcasting/film/video in the Last Year:   . Barista in the Last Year:   Transportation Needs:   . Freight forwarder (Medical):   [name] Lack of Transportation (Non-Medical):   Physical Activity:   . Days of Exercise per Week:   . Minutes of Exercise per Session:   Stress:   . Feeling of Stress :   Social Connections:   . Frequency of Communication with Friends and Family:   . Frequency of Social Gatherings with Friends and Family:   . Attends Religious Services:   . Active Member of Clubs or Organizations:   . Attends Banker Meetings:   [name] Marital Status:   Intimate Partner Violence:   . Fear of Current or Ex-Partner:   . Emotionally Abused:   [name] Physically Abused:   . Sexually Abused:     Outpatient Medications Prior to Visit  Medication Sig Dispense Refill  . baclofen (LIORESAL) 20 MG tablet Take 20 mg by mouth 3 (three) times daily.    . ergocalciferol (VITAMIN D2) 1.25 MG (50000 UT) capsule Take 50,000 Units by mouth once a week.    . gabapentin (NEURONTIN) 600 MG tablet Take 1 tablet (600  mg total) by mouth in the morning, at noon, in the evening, and at bedtime. 360 tablet 0  . hydrOXYzine (ATARAX/VISTARIL) 50 MG tablet Take 1 tablet (50 mg total) by mouth 3 (three) times daily as needed for anxiety. 30 tablet   . LAVENDER OIL PO Take 1 capsule by mouth daily.    [name] LORazepam (ATIVAN) 0.5 MG tablet Take 0.5-1 tablets (0.25-0.5 mg total) by mouth every 6 (six) hours as needed for up to 30 doses for anxiety. (Patient taking differently: Take 0.5 mg by mouth every 6 (six) hours as needed for anxiety. ) 30 tablet 1  . magnesium oxide (MAG-OX) 400 MG tablet Take 400 mg by mouth daily.    . naproxen (NAPROSYN) 500 MG tablet Take 1 tablet (500 mg total) by mouth 2 (two) times daily with a meal. (Patient taking differently: Take 500 mg by mouth daily as needed for mild pain. ) 60  tablet 0  . predniSONE (STERAPRED UNI-PAK 21 TAB) 10 MG (21) TBPK tablet Use as directed 21 tablet 0  . QUEtiapine (SEROQUEL) 50 MG tablet 1/2 to 1 tab qhs for sleep (Patient taking differently: Take 50 mg by mouth at bedtime. ) 30 tablet 0  . rizatriptan (MAXALT-MLT) 10 MG disintegrating tablet Take 1 tablet (10 mg total) by mouth as needed for migraine (and may repeat once in 2 hours, if no relief (DISSOLVE IN THE MOUTH)). (Patient taking differently: Take 10 mg by mouth as needed for migraine. May repeat in once in 2 hours.) 11 tablet 5  . oxyCODONE (ROXICODONE) 5 MG immediate release tablet Take 1 tablet (5 mg total) by mouth every 4 (four) hours as needed for severe pain. 12 tablet 0  . baclofen (LIORESAL) 10 MG tablet Take 1 tablet (10 mg total) by mouth 3 (three) times daily. (Patient not taking: Reported on 09/24/2019) 30 each 0  . baclofen (LIORESAL) 20 MG tablet Take 1 tablet (20 mg total) by mouth 3 (three) times daily. (Patient not taking: Reported on 09/24/2019) 30 each 0  . buPROPion (WELLBUTRIN XL) 300 MG 24 hr tablet Take 1 tablet (300 mg total) by mouth in the morning. 30 tablet 1  . FLUoxetine (PROZAC) 40 MG capsule Take 1 capsule (40 mg total) by mouth in the morning. 30 capsule 1  . fluticasone (FLONASE) 50 MCG/ACT nasal spray Place 2 sprays into both nostrils daily. (Patient not taking: Reported on 09/24/2019) 16 g 0  . methylPREDNISolone (MEDROL DOSEPAK) 4 MG TBPK tablet As directed on pkg label (Patient not taking: Reported on 09/24/2019) 21 each 0   No facility-administered medications prior to visit.    Allergies  Allergen Reactions  . Sumatriptan Shortness Of Breath and Other (See Comments)    Entire body "felt heavy"  . Other Itching, Swelling and Other (See Comments)    Pesticides (family history)- Exposure face and mouth itch and the tongue/lips swell    ROS Review of Systems  Constitutional: Negative.   HENT: Negative.   Eyes: Negative.   Respiratory: Negative.     Cardiovascular: Negative.   Gastrointestinal: Positive for abdominal pain and nausea. Negative for abdominal distention, anal bleeding, blood in stool, constipation, diarrhea, rectal pain and vomiting.  Endocrine: Negative.   Musculoskeletal: Negative.   Skin: Negative.   Allergic/Immunologic: Negative.   Neurological: Negative.   Hematological: Negative.   Psychiatric/Behavioral: Negative.   All other systems reviewed and are negative.     Objective:    Physical Exam Vitals and nursing note reviewed.  Constitutional:      General: She is not in acute distress.    Appearance: She is well-developed and normal weight. She is not ill-appearing, toxic-appearing or diaphoretic.  Abdominal:     General: Bowel sounds are normal. There is no abdominal bruit.     Palpations: Abdomen is soft. There is no fluid wave, hepatomegaly, splenomegaly or mass.     Tenderness: There is abdominal tenderness. There is guarding. There is no rebound.  Neurological:     General: No focal deficit present.     Mental Status: She is alert and oriented to person, place, and time.  Psychiatric:        Mood and Affect: Mood normal. Mood is not anxious.        Behavior: Behavior normal. Behavior is not agitated.     BP 107/72   Pulse 78   Temp 97.9 F (36.6 C) (Temporal)   Resp 18   Ht 5' (1.524 m)   Wt 123 lb 6.4 oz (56 kg)   LMP 09/16/2019   SpO2 99%   BMI 24.10 kg/m  Wt Readings from Last 3 Encounters:  09/25/19 123 lb 6.4 oz (56 kg)  09/22/19 125 lb (56.7 kg)  07/24/19 126 lb 12.8 oz (57.5 kg)     There are no preventive care reminders to display for this patient.  There are no preventive care reminders to display for this patient.  Lab Results  Component Value Date   TSH 0.496 06/17/2019   Lab Results  Component Value Date   WBC 11.0 (H) 09/24/2019   HGB 14.4 09/24/2019   HCT 42.9 09/24/2019   MCV 91.3 09/24/2019   PLT 347 09/24/2019   Lab Results  Component Value Date    NA 136 09/24/2019   K 4.0 09/24/2019   CO2 28 09/24/2019   GLUCOSE 99 09/24/2019   BUN 10 09/24/2019   CREATININE 0.68 09/24/2019   BILITOT 0.9 09/24/2019   ALKPHOS 86 09/24/2019   AST 670 (H) 09/24/2019   ALT 236 (H) 09/24/2019   PROT 7.7 09/24/2019   ALBUMIN 4.6 09/24/2019   CALCIUM 10.0 09/24/2019   ANIONGAP 8 09/24/2019   No results found for: CHOL No results found for: HDL No results found for: LDLCALC No results found for: TRIG No results found for: CHOLHDL No results found for: WCHE5I    Assessment & Plan:   Problem List Items Addressed This Visit    None    Visit Diagnoses    Biliary calculus of other site without obstruction    -  Primary   Relevant Orders   Ambulatory referral to General Surgery      No orders of the defined types were placed in this encounter.   Follow-up: No follow-ups on file.    Janeece Agee, NP

## 2019-10-02 NOTE — Telephone Encounter (Signed)
Pt has surgery coming up but is going to run out of her Oxycodone before then pt is wondering if you can write her enough to get to her surgery date, Previously prescribed by ER

## 2019-10-07 ENCOUNTER — Ambulatory Visit: Payer: Medicaid Other | Admitting: Family Medicine

## 2019-10-07 ENCOUNTER — Other Ambulatory Visit: Payer: Self-pay

## 2019-10-07 DIAGNOSIS — G43719 Chronic migraine without aura, intractable, without status migrainosus: Secondary | ICD-10-CM | POA: Diagnosis not present

## 2019-10-07 DIAGNOSIS — R519 Headache, unspecified: Secondary | ICD-10-CM

## 2019-10-07 NOTE — Progress Notes (Signed)
She returns today for second Botox procedure. She tolerated last procedure well. She dos feel Botox is helping migraines. She has used rizatriptan 3-4 times since March. She did have optic neuritis in March. MRI concerning for foci. LP performed and CSF normal. She is being followed by Dr Epimenio Foot every 6 months with repeat imaging planned. She reports vision loss of left eye resolved. She has had some blurred/double vision bilaterally and is followed by Dr Dione Booze.   Consent Form Botulism Toxin Injection For Chronic Migraine    Reviewed orally with patient, additionally signature is on file:  Botulism toxin has been approved by the Federal drug administration for treatment of chronic migraine. Botulism toxin does not cure chronic migraine and it may not be effective in some patients.  The administration of botulism toxin is accomplished by injecting a small amount of toxin into the muscles of the neck and head. Dosage must be titrated for each individual. Any benefits resulting from botulism toxin tend to wear off after 3 months with a repeat injection required if benefit is to be maintained. Injections are usually done every 3-4 months with maximum effect peak achieved by about 2 or 3 weeks. Botulism toxin is expensive and you should be sure of what costs you will incur resulting from the injection.  The side effects of botulism toxin use for chronic migraine may include:   -Transient, and usually mild, facial weakness with facial injections  -Transient, and usually mild, head or neck weakness with head/neck injections  -Reduction or loss of forehead facial animation due to forehead muscle weakness  -Eyelid drooping  -Dry eye  -Pain at the site of injection or bruising at the site of injection  -Double vision  -Potential unknown long term risks   Contraindications: You should not have Botox if you are pregnant, nursing, allergic to albumin, have an infection, skin condition, or muscle  weakness at the site of the injection, or have myasthenia gravis, Lambert-Eaton syndrome, or ALS.  It is also possible that as with any injection, there may be an allergic reaction or no effect from the medication. Reduced effectiveness after repeated injections is sometimes seen and rarely infection at the injection site may occur. All care will be taken to prevent these side effects. If therapy is given over a long time, atrophy and wasting in the muscle injected may occur. Occasionally the patient's become refractory to treatment because they develop antibodies to the toxin. In this event, therapy needs to be modified.  I have read the above information and consent to the administration of botulism toxin.    BOTOX PROCEDURE NOTE FOR MIGRAINE HEADACHE  Contraindications and precautions discussed with patient(above). Aseptic procedure was observed and patient tolerated procedure. Procedure performed by Shawnie Dapper, FNP-C.   The condition has existed for more than 6 months, and pt does not have a diagnosis of ALS, Myasthenia Gravis or Lambert-Eaton Syndrome.  Risks and benefits of injections discussed and pt agrees to proceed with the procedure.  Written consent obtained  These injections are medically necessary. Pt  receives good benefits from these injections. These injections do not cause sedations or hallucinations which the oral therapies may cause.   Description of procedure:  The patient was placed in a sitting position. The standard protocol was used for Botox as follows, with 5 units of Botox injected at each site:  -Procerus muscle, midline injection  -Corrugator muscle, bilateral injection  -Frontalis muscle, bilateral injection, with 2 sites each side, medial injection  was performed in the upper one third of the frontalis muscle, in the region vertical from the medial inferior edge of the superior orbital rim. The lateral injection was again in the upper one third of the forehead  vertically above the lateral limbus of the cornea, 1.5 cm lateral to the medial injection site.  -Temporalis muscle injection, 4 sites, bilaterally. The first injection was 3 cm above the tragus of the ear, second injection site was 1.5 cm to 3 cm up from the first injection site in line with the tragus of the ear. The third injection site was 1.5-3 cm forward between the first 2 injection sites. The fourth injection site was 1.5 cm posterior to the second injection site. 5th site laterally in the temporalis  muscleat the level of the outer canthus.  -Occipitalis muscle injection, 3 sites, bilaterally. The first injection was done one half way between the occipital protuberance and the tip of the mastoid process behind the ear. The second injection site was done lateral and superior to the first, 1 fingerbreadth from the first injection. The third injection site was 1 fingerbreadth superiorly and medially from the first injection site.  -Cervical paraspinal muscle injection, 2 sites, bilaterally. The first injection site was 1 cm from the midline of the cervical spine, 3 cm inferior to the lower border of the occipital protuberance. The second injection site was 1.5 cm superiorly and laterally to the first injection site.  -Trapezius muscle injection was performed at 3 sites, bilaterally. The first injection site was in the upper trapezius muscle halfway between the inflection point of the neck, and the acromion. The second injection site was one half way between the acromion and the first injection site. The third injection was done between the first injection site and the inflection point of the neck.   Will return for repeat injection in 3 months.   A total of 200 units of Botox was prepared, 155 units of Botox was injected as documented above, any Botox not injected was wasted. The patient tolerated the procedure well, there were no complications of the above procedure.

## 2019-10-07 NOTE — Progress Notes (Signed)
Botox- 200 units x 1 vial Lot: [id number] Expiration: 07/01/2019 NDC: 5573-2202-54  Bacteriostatic 0.9% Sodium Chloride- 56mL total Lot: [id number] Expiration: 05/30/2020 NDC: 62831-517-61  Dx: Y07.371 S/P

## 2019-10-11 ENCOUNTER — Telehealth: Payer: Medicaid Other | Admitting: Family

## 2019-10-11 DIAGNOSIS — K219 Gastro-esophageal reflux disease without esophagitis: Secondary | ICD-10-CM

## 2019-10-11 MED ORDER — OMEPRAZOLE 20 MG PO CPDR: 20.0000 mg | DELAYED_RELEASE_CAPSULE | Freq: Every day | ORAL | 3 refills | Status: DC

## 2019-10-11 NOTE — Progress Notes (Signed)
We are sorry that you are not feeling well.  Here is how we plan to help!  Based on what you shared with me it looks like you most likely have Gastroesophageal Reflux Disease (GERD)  Gastroesophageal reflux disease (GERD) happens when acid from your stomach flows up into the esophagus.  When acid comes in contact with the esophagus, the acid causes sorenss (inflammation) in the esophagus.  Over time, GERD may create small holes (ulcers) in the lining of the esophagus.  I have prescribed Omeprazole 20 mg one by mouth daily until you follow up with a provider. Given all of your symptoms, I want you to follow up with your primary care provider this week.   Your symptoms should improve in the next day or two.  You can use antacids as needed until symptoms resolve.  Call us if your heartburn worsens, you have trouble swallowing, weight loss, spitting up blood or recurrent vomiting.  Home Care:  May include lifestyle changes such as weight loss, quitting smoking and alcohol consumption  Avoid foods and drinks that make your symptoms worse, such as:  Caffeine or alcoholic drinks  Chocolate  Peppermint or mint flavorings  Garlic and onions  Spicy foods  Citrus fruits, such as oranges, lemons, or limes  Tomato-based foods such as sauce, chili, salsa and pizza  Fried and fatty foods  Avoid lying down for 3 hours prior to your bedtime or prior to taking a nap  Eat small, frequent meals instead of a large meals  Wear loose-fitting clothing.  Do not wear anything tight around your waist that causes pressure on your stomach.  Raise the head of your bed 6 to 8 inches with wood blocks to help you sleep.  Extra pillows will not help.  Seek Help Right Away If:  You have pain in your arms, neck, jaw, teeth or back  Your pain increases or changes in intensity or duration  You develop nausea, vomiting or sweating (diaphoresis)  You develop shortness of breath or you faint  Your vomit is  green, yellow, black or looks like coffee grounds or blood  Your stool is red, bloody or black  These symptoms could be signs of other problems, such as heart disease, gastric bleeding or esophageal bleeding.  Make sure you :  Understand these instructions.  Will watch your condition.  Will get help right away if you are not doing well or get worse.  Your e-visit answers were reviewed by a board certified advanced clinical practitioner to complete your personal care plan.  Depending on the condition, your plan could have included both over the counter or prescription medications.  If there is a problem please reply  once you have received a response from your provider.  Your safety is important to [place].  If you have drug allergies check your prescription carefully.    You can use MyChart to ask questions about today's visit, request a non-urgent call back, or ask for a work or school excuse for 24 hours related to this e-Visit. If it has been greater than 24 hours you will need to follow up with your provider, or enter a new e-Visit to address those concerns.  You will get an e-mail in the next two days asking about your experience.  I hope that your e-visit has been valuable and will speed your recovery. Thank you for using e-visits.  Approximately 5 minutes was spent documenting and reviewing patient's chart.

## 2019-10-14 ENCOUNTER — Other Ambulatory Visit: Payer: Self-pay

## 2019-10-14 ENCOUNTER — Ambulatory Visit (INDEPENDENT_AMBULATORY_CARE_PROVIDER_SITE_OTHER): Payer: Medicaid Other | Admitting: Registered Nurse

## 2019-10-14 VITALS — BP 105/75 | HR 70 | Temp 98.0°F | Ht 60.0 in | Wt 122.0 lb

## 2019-10-14 DIAGNOSIS — Z1329 Encounter for screening for other suspected endocrine disorder: Secondary | ICD-10-CM | POA: Diagnosis not present

## 2019-10-14 DIAGNOSIS — Z13228 Encounter for screening for other metabolic disorders: Secondary | ICD-10-CM | POA: Diagnosis not present

## 2019-10-14 DIAGNOSIS — K219 Gastro-esophageal reflux disease without esophagitis: Secondary | ICD-10-CM | POA: Diagnosis not present

## 2019-10-14 DIAGNOSIS — Z130 Encounter for screening for diseases of the blood and blood-forming organs and certain disorders involving the immune mechanism: Secondary | ICD-10-CM

## 2019-10-14 NOTE — Patient Instructions (Signed)
° ° ° °  If you have lab work done today you will be contacted with your lab results within the next 2 weeks.  If you have not heard from us then please contact us. The fastest way to get your results is to register for My Chart. ° ° °IF you received an x-ray today, you will receive an invoice from [facility]. Please contact [facility] at [phone] with questions or concerns regarding your invoice.  ° °IF you received labwork today, you will receive an invoice from [facility]. Please contact [facility] at [phone] with questions or concerns regarding your invoice.  ° °Our billing staff will not be able to assist you with questions regarding bills from these companies. ° °You will be contacted with the lab results as soon as they are available. The fastest way to get your results is to activate your My Chart account. Instructions are located on the last page of this paperwork. If you have not heard from us regarding the results in 2 weeks, please contact this office. °  ° ° ° °

## 2019-10-15 ENCOUNTER — Ambulatory Visit: Payer: Self-pay | Admitting: Surgery

## 2019-10-15 ENCOUNTER — Other Ambulatory Visit: Payer: Self-pay

## 2019-10-15 ENCOUNTER — Encounter (HOSPITAL_COMMUNITY): Payer: Self-pay | Admitting: Surgery

## 2019-10-15 DIAGNOSIS — K819 Cholecystitis, unspecified: Secondary | ICD-10-CM | POA: Diagnosis not present

## 2019-10-15 LAB — COMPREHENSIVE METABOLIC PANEL
ALT: 14 IU/L (ref 0–32)
AST: 20 IU/L (ref 0–40)
Albumin/Globulin Ratio: 1.8 (ref 1.2–2.2)
Albumin: 4.7 g/dL (ref 3.9–5.0)
Alkaline Phosphatase: 97 IU/L (ref 48–121)
BUN/Creatinine Ratio: 7 — ABNORMAL LOW (ref 9–23)
BUN: 6 mg/dL (ref 6–20)
Bilirubin Total: 0.4 mg/dL (ref 0.0–1.2)
CO2: 23 mmol/L (ref 20–29)
Calcium: 9.9 mg/dL (ref 8.7–10.2)
Chloride: 103 mmol/L (ref 96–106)
Creatinine, Ser: 0.82 mg/dL (ref 0.57–1.00)
GFR calc Af Amer: 116 mL/min/{1.73_m2} (ref >59)
GFR calc non Af Amer: 100 mL/min/{1.73_m2} (ref >59)
Globulin, Total: 2.6 g/dL (ref 1.5–4.5)
Glucose: 82 mg/dL (ref 65–99)
Potassium: 4.7 mmol/L (ref 3.5–5.2)
Sodium: 138 mmol/L (ref 134–144)
Total Protein: 7.3 g/dL (ref 6.0–8.5)

## 2019-10-15 LAB — CBC
Hematocrit: 43 % (ref 34.0–46.6)
Hemoglobin: 14.5 g/dL (ref 11.1–15.9)
MCH: 30.2 pg (ref 26.6–33.0)
MCHC: 33.7 g/dL (ref 31.5–35.7)
MCV: 90 fL (ref 79–97)
Platelets: 411 10*3/uL (ref 150–450)
RBC: 4.8 x10E6/uL (ref 3.77–5.28)
RDW: 12.1 % (ref 11.7–15.4)
WBC: 7 10*3/uL (ref 3.4–10.8)

## 2019-10-15 NOTE — H&P (Signed)
Surgical Evaluation   HPI: Lovely 24-year-old woman with history of anxiety, depression, migraines treated with Botox and hypovitaminosis D who is referred by emergency department for biliary colic. She presented there on June 24 with a 2 day history of intermittent right upper quadrant pain. She was found to have a lipase of 55, AST of 670, ALT of 236, bilirubin 0.9. White blood cell count at that time was 11. Ultrasound demonstrated fatty infiltration of the liver and cholelithiasis without evidence of cholecystitis. Of note the ultrasound documents a contracted gallbladder containing multiple stones. Hepatitis panel was negative. Repeat LFTs yesterday demonstrated normalization of her AST and ALT, bilirubin of 0.4. She notes that she has had intermittent upper abdominal pain for quite some time and has been to the emergency room on prior occasions with the same. On this occasion the pain was much higher in [place] thought she might be having a heart attack. Currently is still having some discomfort but does feel better than initially. Gestures to the subcostal margin in the right midclavicular line when asked where the pain is most intense. Notes that the pain is associated with nausea. She has not had any prior abdominal surgery.  Allergies  Allergen Reactions  . Sumatriptan Shortness Of Breath and Other (See Comments)    Entire body "felt heavy"  . Other Itching, Swelling and Other (See Comments)    Pesticides (family history)- Exposure face and mouth itch and the tongue/lips swell    Past Medical History:  Diagnosis Date  . Anxiety   . Depression   . Hypovitaminosis D 04/07/2019  . Left eye complaint 06/08/2019  . Migraines     Past Surgical History:  Procedure Laterality Date  . NO PAST SURGERIES      Family History  Problem Relation Age of Onset  . Migraines Mother   . Thyroid disease Mother   . Cancer Maternal Grandmother   . Diabetes Paternal Grandfather   .  Hypertension Maternal Grandfather   . Diabetes Paternal Grandmother   . Stroke Paternal Grandmother     Social History   Socioeconomic History  . Marital status: Significant Other    Spouse name: Not on file  . Number of children: 0  . Years of education: college student  . Highest education level: Not on file  Occupational History  . Not on file  Tobacco Use  . Smoking status: Never Smoker  . Smokeless tobacco: Never Used  Vaping Use  . Vaping Use: Never used  Substance and Sexual Activity  . Alcohol use: No  . Drug use: No  . Sexual activity: Not Currently  Other Topics Concern  . Not on file  Social History Narrative   Lives at home alone   Right handed   Does not drink caffeine, makes her nauseated   Social Determinants of Health   Financial Resource Strain:   . Difficulty of Paying Living Expenses:   Food Insecurity:   . Worried About Running Out of Food in the Last Year:   . Ran Out of Food in the Last Year:   Transportation Needs:   . Lack of Transportation (Medical):   . Lack of Transportation (Non-Medical):   Physical Activity:   . Days of Exercise per Week:   . Minutes of Exercise per Session:   Stress:   . Feeling of Stress :   Social Connections:   . Frequency of Communication with Friends and Family:   . Frequency of Social Gatherings with Friends and   Family:   . Attends Religious Services:   . Active Member of Clubs or Organizations:   . Attends Club or Organization Meetings:   . Marital Status:     Current Outpatient Medications on File Prior to Visit  Medication Sig Dispense Refill  . baclofen (LIORESAL) 20 MG tablet Take 20 mg by mouth 3 (three) times daily.    . buPROPion (WELLBUTRIN XL) 300 MG 24 hr tablet Take 1 tablet (300 mg total) by mouth in the morning. 30 tablet 1  . ergocalciferol (VITAMIN D2) 1.25 MG (50000 UT) capsule Take 50,000 Units by mouth once a week.    . FLUoxetine (PROZAC) 40 MG capsule Take 1 capsule (40 mg total) by  mouth in the morning. 30 capsule 1  . fluticasone (FLONASE) 50 MCG/ACT nasal spray Place 2 sprays into both nostrils daily. 16 g 0  . gabapentin (NEURONTIN) 600 MG tablet Take 1 tablet (600 mg total) by mouth in the morning, at noon, in the evening, and at bedtime. 360 tablet 0  . hydrOXYzine (ATARAX/VISTARIL) 50 MG tablet Take 1 tablet (50 mg total) by mouth 3 (three) times daily as needed for anxiety. 30 tablet   . LAVENDER OIL PO Take 1 capsule by mouth daily.    . LORazepam (ATIVAN) 0.5 MG tablet Take 0.5-1 tablets (0.25-0.5 mg total) by mouth every 6 (six) hours as needed for up to 30 doses for anxiety. (Patient taking differently: Take 0.5 mg by mouth every 6 (six) hours as needed for anxiety. ) 30 tablet 1  . magnesium oxide (MAG-OX) 400 MG tablet Take 400 mg by mouth daily.    . naproxen (NAPROSYN) 500 MG tablet Take 1 tablet (500 mg total) by mouth 2 (two) times daily with a meal. (Patient taking differently: Take 500 mg by mouth daily as needed for mild pain. ) 60 tablet 0  . omeprazole (PRILOSEC) 20 MG capsule Take 1 capsule (20 mg total) by mouth daily. 30 capsule 3  . oxyCODONE (ROXICODONE) 5 MG immediate release tablet Take 1 tablet (5 mg total) by mouth every 4 (four) hours as needed for severe pain. 12 tablet 0  . QUEtiapine (SEROQUEL) 50 MG tablet 1/2 to 1 tab qhs for sleep (Patient taking differently: Take 50 mg by mouth at bedtime. ) 30 tablet 0  . rizatriptan (MAXALT-MLT) 10 MG disintegrating tablet Take 1 tablet (10 mg total) by mouth as needed for migraine (and may repeat once in 2 hours, if no relief (DISSOLVE IN THE MOUTH)). (Patient taking differently: Take 10 mg by mouth as needed for migraine. May repeat in once in 2 hours.) 11 tablet 5   No current facility-administered medications on file prior to visit.    Review of Systems: a complete, 10pt review of systems was completed. HEENT Present- Wears glasses/contact lenses. Not Present- Earache, Hearing Loss, Hoarseness, Nose  Bleed, Oral Ulcers, Ringing in the Ears, Seasonal Allergies, Sinus Pain, Sore Throat, Visual Disturbances and Yellow Eyes. Respiratory Not Present- Bloody sputum, Chronic Cough, Difficulty Breathing, Snoring and Wheezing. Breast Not Present- Breast Mass, Breast Pain, Nipple Discharge and Skin Changes. Cardiovascular Present- Chest Pain. Not Present- Difficulty Breathing Lying Down, Leg Cramps, Palpitations, Rapid Heart Rate, Shortness of Breath and Swelling of Extremities. Gastrointestinal Present- Bloating and Nausea. Not Present- Abdominal Pain, Bloody Stool, Change in Bowel Habits, Chronic diarrhea, Constipation, Difficulty Swallowing, Excessive gas, Gets full quickly at meals, Hemorrhoids, Indigestion, Rectal Pain and Vomiting. Musculoskeletal Present- Back Pain. Not Present- Joint Pain, Joint Stiffness, Muscle Pain,   Muscle Weakness and Swelling of Extremities. Neurological Present- Headaches, Numbness and Tingling. Not Present- Decreased Memory, Fainting, Seizures, Tremor, Trouble walking and Weakness. Psychiatric Present- Depression. Not Present- Anxiety, Bipolar, Change in Sleep Pattern, Fearful and Frequent crying. Endocrine Present- Heat Intolerance and Hot flashes. Not Present- Cold Intolerance, Excessive Hunger, Hair Changes and New Diabetes. Hematology Not Present- Blood Thinners, Easy Bruising, Excessive bleeding, Gland problems, HIV and Persistent Infections. All other systems negative  Physical Exam: Vitals Weight: 121 lb Height: 61in Body Surface Area: 1.53 m Body Mass Index: 22.86 kg/m  Temp.: 97.9F(Temporal)  Pulse: 102 (Regular)  P.OX: 99% (Room air) BP: 102/66(Sitting, Left Arm, Standard)  Alert and well-appearing Extraocular motions intact, no scleral icterus Unlabored respirations Abdomen is soft, nondistended, nontender. There is no mass or organomegaly.   CBC Latest Ref Rng & Units 10/14/2019 09/24/2019 09/22/2019  WBC 3.4 - 10.8 x10E3/uL 7.0 11.0(H)  13.2(H)  Hemoglobin 11.1 - 15.9 g/dL 14.5 14.4 14.5  Hematocrit 34.0 - 46.6 % 43.0 42.9 43.4  Platelets 150 - 450 x10E3/uL 411 347 376    CMP Latest Ref Rng & Units 10/14/2019 09/24/2019 09/22/2019  Glucose 65 - 99 mg/dL 82 99 121(H)  BUN 6 - 20 mg/dL 6 10 11  Creatinine 0.57 - 1.00 mg/dL 0.82 0.68 0.76  Sodium 134 - 144 mmol/L 138 136 139  Potassium 3.5 - 5.2 mmol/L 4.7 4.0 3.5  Chloride 96 - 106 mmol/L 103 100 104  CO2 20 - 29 mmol/L 23 28 24  Calcium 8.7 - 10.2 mg/dL 9.9 10.0 9.5  Total Protein 6.0 - 8.5 g/dL 7.3 7.7 -  Total Bilirubin 0.0 - 1.2 mg/dL 0.4 0.9 -  Alkaline Phos 48 - 121 IU/L 97 86 -  AST 0 - 40 IU/L 20 670(H) -  ALT 0 - 32 IU/L 14 236(H) -    No results found for: INR, PROTIME  Imaging: No results found.   A/P: CHOLECYSTITIS (K81.9) Story: Continued smoldering pain. I recommend proceeding with laparoscopic or robotic cholecystectomy with possible cholangiogram. Discussed risks of surgery including bleeding, pain, scarring, intraabdominal injury specifically to the common bile duct and sequelae, bile leak, conversion to open surgery, failure to resolve symptoms, blood clots/ pulmonary embolus, heart attack, pneumonia, stroke, death. Questions welcomed and answered to patient's satisfaction. We'll proceed with scheduling as soon as possible.    Patient Active Problem List   Diagnosis Date Noted  . Left lumbar radiculitis 07/24/2019  . Vitamin D deficiency 07/15/2019  . 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 Numbness 06/25/2019  . Blurry vision   . Generalized anxiety disorder   . Optic neuritis 06/10/2019  . Hypovitaminosis D 04/07/2019  . Migraines 10/28/2018  . Lumbar radiculopathy 10/28/2018  . Chronic daily headache 02/04/2017       Ella Guillotte, MD [facility]  See AMION to contact appropriate on-call provider   

## 2019-10-15 NOTE — H&P (View-Only) (Signed)
Surgical Evaluation   HPI: Lovely 24 year old woman with history of anxiety, depression, migraines treated with Botox and hypovitaminosis D who is referred by emergency department for biliary colic. She presented there on June 24 with a 2 day history of intermittent right upper quadrant pain. She was found to have a lipase of 55, AST of 670, ALT of 236, bilirubin 0.9. White blood cell count at that time was 11. Ultrasound demonstrated fatty infiltration of the liver and cholelithiasis without evidence of cholecystitis. Of note the ultrasound documents a contracted gallbladder containing multiple stones. Hepatitis panel was negative. Repeat LFTs yesterday demonstrated normalization of her AST and ALT, bilirubin of 0.4. She notes that she has had intermittent upper abdominal pain for quite some time and has been to the emergency room on prior occasions with the same. On this occasion the pain was much higher in [place] thought she might be having a heart attack. Currently is still having some discomfort but does feel better than initially. Gestures to the subcostal margin in the right midclavicular line when asked where the pain is most intense. Notes that the pain is associated with nausea. She has not had any prior abdominal surgery.  Allergies  Allergen Reactions  . Sumatriptan Shortness Of Breath and Other (See Comments)    Entire body "felt heavy"  . Other Itching, Swelling and Other (See Comments)    Pesticides (family history)- Exposure face and mouth itch and the tongue/lips swell    Past Medical History:  Diagnosis Date  . Anxiety   . Depression   . Hypovitaminosis D 04/07/2019  . Left eye complaint 06/08/2019  . Migraines     Past Surgical History:  Procedure Laterality Date  . NO PAST SURGERIES      Family History  Problem Relation Age of Onset  . Migraines Mother   . Thyroid disease Mother   . Cancer Maternal Grandmother   . Diabetes Paternal Grandfather   .  Hypertension Maternal Grandfather   . Diabetes Paternal Grandmother   . Stroke Paternal Grandmother     Social History   Socioeconomic History  . Marital status: Significant Other    Spouse name: Not on file  . Number of children: 0  . Years of education: college student  . Highest education level: Not on file  Occupational History  . Not on file  Tobacco Use  . Smoking status: Never Smoker  . Smokeless tobacco: Never Used  Vaping Use  . Vaping Use: Never used  Substance and Sexual Activity  . Alcohol use: No  . Drug use: No  . Sexual activity: Not Currently  Other Topics Concern  . Not on file  Social History Narrative   Lives at home alone   Right handed   Does not drink caffeine, makes her nauseated   Social Determinants of Health   Financial Resource Strain:   . Difficulty of Paying Living Expenses:   Food Insecurity:   . Worried About Programme researcher, broadcasting/film/video in the Last Year:   . Barista in the Last Year:   Transportation Needs:   . Freight forwarder (Medical):   [name] Lack of Transportation (Non-Medical):   Physical Activity:   . Days of Exercise per Week:   . Minutes of Exercise per Session:   Stress:   . Feeling of Stress :   Social Connections:   . Frequency of Communication with Friends and Family:   . Frequency of Social Gatherings with Friends and  Family:   . Attends Religious Services:   . Active Member of Clubs or Organizations:   . Attends Banker Meetings:   [name] Marital Status:     Current Outpatient Medications on File Prior to Visit  Medication Sig Dispense Refill  . baclofen (LIORESAL) 20 MG tablet Take 20 mg by mouth 3 (three) times daily.    [name] buPROPion (WELLBUTRIN XL) 300 MG 24 hr tablet Take 1 tablet (300 mg total) by mouth in the morning. 30 tablet 1  . ergocalciferol (VITAMIN D2) 1.25 MG (50000 UT) capsule Take 50,000 Units by mouth once a week.    [name] FLUoxetine (PROZAC) 40 MG capsule Take 1 capsule (40 mg total) by  mouth in the morning. 30 capsule 1  . fluticasone (FLONASE) 50 MCG/ACT nasal spray Place 2 sprays into both nostrils daily. 16 g 0  . gabapentin (NEURONTIN) 600 MG tablet Take 1 tablet (600 mg total) by mouth in the morning, at noon, in the evening, and at bedtime. 360 tablet 0  . hydrOXYzine (ATARAX/VISTARIL) 50 MG tablet Take 1 tablet (50 mg total) by mouth 3 (three) times daily as needed for anxiety. 30 tablet   . LAVENDER OIL PO Take 1 capsule by mouth daily.    [name] LORazepam (ATIVAN) 0.5 MG tablet Take 0.5-1 tablets (0.25-0.5 mg total) by mouth every 6 (six) hours as needed for up to 30 doses for anxiety. (Patient taking differently: Take 0.5 mg by mouth every 6 (six) hours as needed for anxiety. ) 30 tablet 1  . magnesium oxide (MAG-OX) 400 MG tablet Take 400 mg by mouth daily.    . naproxen (NAPROSYN) 500 MG tablet Take 1 tablet (500 mg total) by mouth 2 (two) times daily with a meal. (Patient taking differently: Take 500 mg by mouth daily as needed for mild pain. ) 60 tablet 0  . omeprazole (PRILOSEC) 20 MG capsule Take 1 capsule (20 mg total) by mouth daily. 30 capsule 3  . oxyCODONE (ROXICODONE) 5 MG immediate release tablet Take 1 tablet (5 mg total) by mouth every 4 (four) hours as needed for severe pain. 12 tablet 0  . QUEtiapine (SEROQUEL) 50 MG tablet 1/2 to 1 tab qhs for sleep (Patient taking differently: Take 50 mg by mouth at bedtime. ) 30 tablet 0  . rizatriptan (MAXALT-MLT) 10 MG disintegrating tablet Take 1 tablet (10 mg total) by mouth as needed for migraine (and may repeat once in 2 hours, if no relief (DISSOLVE IN THE MOUTH)). (Patient taking differently: Take 10 mg by mouth as needed for migraine. May repeat in once in 2 hours.) 11 tablet 5   No current facility-administered medications on file prior to visit.    Review of Systems: a complete, 10pt review of systems was completed. HEENT Present- Wears glasses/contact lenses. Not Present- Earache, Hearing Loss, Hoarseness, Nose  Bleed, Oral Ulcers, Ringing in the Ears, Seasonal Allergies, Sinus Pain, Sore Throat, Visual Disturbances and Yellow Eyes. Respiratory Not Present- Bloody sputum, Chronic Cough, Difficulty Breathing, Snoring and Wheezing. Breast Not Present- Breast Mass, Breast Pain, Nipple Discharge and Skin Changes. Cardiovascular Present- Chest Pain. Not Present- Difficulty Breathing Lying Down, Leg Cramps, Palpitations, Rapid Heart Rate, Shortness of Breath and Swelling of Extremities. Gastrointestinal Present- Bloating and Nausea. Not Present- Abdominal Pain, Bloody Stool, Change in Bowel Habits, Chronic diarrhea, Constipation, Difficulty Swallowing, Excessive gas, Gets full quickly at meals, Hemorrhoids, Indigestion, Rectal Pain and Vomiting. Musculoskeletal Present- Back Pain. Not Present- Joint Pain, Joint Stiffness, Muscle Pain,  Muscle Weakness and Swelling of Extremities. Neurological Present- Headaches, Numbness and Tingling. Not Present- Decreased Memory, Fainting, Seizures, Tremor, Trouble walking and Weakness. Psychiatric Present- Depression. Not Present- Anxiety, Bipolar, Change in Sleep Pattern, Fearful and Frequent crying. Endocrine Present- Heat Intolerance and Hot flashes. Not Present- Cold Intolerance, Excessive Hunger, Hair Changes and New Diabetes. Hematology Not Present- Blood Thinners, Easy Bruising, Excessive bleeding, Gland problems, HIV and Persistent Infections. All other systems negative  Physical Exam: Vitals Weight: 121 lb Height: 61in Body Surface Area: 1.53 m Body Mass Index: 22.86 kg/m  Temp.: 97.6F(Temporal)  Pulse: 102 (Regular)  P.OX: 99% (Room air) BP: 102/66(Sitting, Left Arm, Standard)  Alert and well-appearing Extraocular motions intact, no scleral icterus Unlabored respirations Abdomen is soft, nondistended, nontender. There is no mass or organomegaly.   CBC Latest Ref Rng & Units 10/14/2019 09/24/2019 09/22/2019  WBC 3.4 - 10.8 x10E3/uL 7.0 11.0(H)  13.2(H)  Hemoglobin 11.1 - 15.9 g/dL 82.4 23.5 36.1  Hematocrit 34.0 - 46.6 % 43.0 42.9 43.4  Platelets 150 - 450 x10E3/uL 411 347 376    CMP Latest Ref Rng & Units 10/14/2019 09/24/2019 09/22/2019  Glucose 65 - 99 mg/dL 82 99 443(X)  BUN 6 - 20 mg/dL 6 10 11   Creatinine 0.57 - 1.00 mg/dL 5.40 0.86  Sodium 134 - 144 mmol/L 138 136 139  Potassium 3.5 - 5.2 mmol/L 4.7 4.0 3.5  Chloride 96 - 106 mmol/L 103 100 104  CO2 20 - 29 mmol/L 23 28 24   Calcium 8.7 - 10.2 mg/dL 9.9 7.61 9.5  Total Protein 6.0 - 8.5 g/dL 7.3 7.7 -  Total Bilirubin 0.0 - 1.2 mg/dL 0.4 0.9 -  Alkaline Phos 48 - 121 IU/L 97 86 -  AST 0 - 40 IU/L 20 670(H) -  ALT 0 - 32 IU/L 14 236(H) -    No results found for: INR, PROTIME  Imaging: No results found.   A/P: CHOLECYSTITIS (K81.9) Story: Continued smoldering pain. I recommend proceeding with laparoscopic or robotic cholecystectomy with possible cholangiogram. Discussed risks of surgery including bleeding, pain, scarring, intraabdominal injury specifically to the common bile duct and sequelae, bile leak, conversion to open surgery, failure to resolve symptoms, blood clots/ pulmonary embolus, heart attack, pneumonia, stroke, death. Questions welcomed and answered to patient's satisfaction. We'll proceed with scheduling as soon as possible.    Patient Active Problem List   Diagnosis Date Noted  . Left lumbar radiculitis 07/24/2019  . Vitamin D deficiency 07/15/2019  . 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 Numbness 06/25/2019  . Blurry vision   . Generalized anxiety disorder   . Optic neuritis 06/10/2019  . Hypovitaminosis D 04/07/2019  . Migraines 10/28/2018  . Lumbar radiculopathy 10/28/2018  . Chronic daily headache 02/04/2017       10/30/2018, MD [facility]  See AMION to contact appropriate on-call provider

## 2019-10-16 ENCOUNTER — Other Ambulatory Visit (HOSPITAL_COMMUNITY)
Admission: RE | Admit: 2019-10-16 | Discharge: 2019-10-16 | Disposition: A | Discharge status: Home / Self Care | Payer: Medicaid Other | Source: Ambulatory Visit | Attending: Surgery | Admitting: Surgery

## 2019-10-16 DIAGNOSIS — Z01812 Encounter for preprocedural laboratory examination: Secondary | ICD-10-CM | POA: Insufficient documentation

## 2019-10-16 DIAGNOSIS — Z20822 Contact with and (suspected) exposure to covid-19: Secondary | ICD-10-CM | POA: Insufficient documentation

## 2019-10-16 LAB — SARS CORONAVIRUS 2 (TAT 6-24 HRS): SARS Coronavirus 2: NEGATIVE

## 2019-10-18 MED ORDER — BUPIVACAINE LIPOSOME 1.3 % IJ SUSP
20.0000 mL | Freq: Once | INTRAMUSCULAR | Status: DC
  Filled 2019-10-18: qty 20

## 2019-10-19 ENCOUNTER — Other Ambulatory Visit: Payer: Self-pay

## 2019-10-19 ENCOUNTER — Encounter (HOSPITAL_COMMUNITY)
Admission: RE | Disposition: A | Discharge status: Home / Self Care | Payer: Self-pay | Source: Home / Self Care | Attending: Surgery

## 2019-10-19 ENCOUNTER — Encounter (HOSPITAL_COMMUNITY): Payer: Self-pay | Admitting: Surgery

## 2019-10-19 ENCOUNTER — Ambulatory Visit (HOSPITAL_COMMUNITY): Payer: Medicaid Other | Admitting: Registered Nurse

## 2019-10-19 ENCOUNTER — Ambulatory Visit (HOSPITAL_COMMUNITY)
Admission: RE | Admit: 2019-10-19 | Discharge: 2019-10-19 | Disposition: A | Discharge status: Home / Self Care | Payer: Medicaid Other | Attending: Surgery | Admitting: Surgery

## 2019-10-19 DIAGNOSIS — Z888 Allergy status to other drugs, medicaments and biological substances status: Secondary | ICD-10-CM | POA: Insufficient documentation

## 2019-10-19 DIAGNOSIS — E559 Vitamin D deficiency, unspecified: Secondary | ICD-10-CM | POA: Diagnosis not present

## 2019-10-19 DIAGNOSIS — K760 Fatty (change of) liver, not elsewhere classified: Secondary | ICD-10-CM | POA: Diagnosis not present

## 2019-10-19 DIAGNOSIS — K219 Gastro-esophageal reflux disease without esophagitis: Secondary | ICD-10-CM | POA: Diagnosis not present

## 2019-10-19 DIAGNOSIS — F329 Major depressive disorder, single episode, unspecified: Secondary | ICD-10-CM | POA: Insufficient documentation

## 2019-10-19 DIAGNOSIS — Z791 Long term (current) use of non-steroidal anti-inflammatories (NSAID): Secondary | ICD-10-CM | POA: Diagnosis not present

## 2019-10-19 DIAGNOSIS — K8010 Calculus of gallbladder with chronic cholecystitis without obstruction: Secondary | ICD-10-CM | POA: Insufficient documentation

## 2019-10-19 DIAGNOSIS — Z79899 Other long term (current) drug therapy: Secondary | ICD-10-CM | POA: Insufficient documentation

## 2019-10-19 DIAGNOSIS — K8050 Calculus of bile duct without cholangitis or cholecystitis without obstruction: Secondary | ICD-10-CM | POA: Diagnosis not present

## 2019-10-19 DIAGNOSIS — F411 Generalized anxiety disorder: Secondary | ICD-10-CM | POA: Diagnosis not present

## 2019-10-19 DIAGNOSIS — F418 Other specified anxiety disorders: Secondary | ICD-10-CM | POA: Diagnosis not present

## 2019-10-19 DIAGNOSIS — G43909 Migraine, unspecified, not intractable, without status migrainosus: Secondary | ICD-10-CM | POA: Diagnosis not present

## 2019-10-19 HISTORY — DX: Gastro-esophageal reflux disease without esophagitis: K21.9

## 2019-10-19 HISTORY — DX: Anemia, unspecified: D64.9

## 2019-10-19 HISTORY — PX: CHOLECYSTECTOMY: SHX55

## 2019-10-19 LAB — PREGNANCY, URINE: Preg Test, Ur: NEGATIVE

## 2019-10-19 SURGERY — LAPAROSCOPIC CHOLECYSTECTOMY
Anesthesia: General | Site: Abdomen

## 2019-10-19 MED ORDER — BUPIVACAINE-EPINEPHRINE (PF) 0.25% -1:200000 IJ SOLN
INTRAMUSCULAR | Status: AC
  Filled 2019-10-19: qty 30

## 2019-10-19 MED ORDER — PROMETHAZINE HCL 25 MG/ML IJ SOLN: 6.2500 mg | INTRAMUSCULAR | Status: DC | PRN

## 2019-10-19 MED ORDER — INDOCYANINE GREEN 25 MG IV SOLR
2.5000 mg | Freq: Once | INTRAVENOUS | Status: AC
  Administered 2019-10-19: 2.5 mg via INTRAVENOUS
  Filled 2019-10-19: qty 1

## 2019-10-19 MED ORDER — DEXAMETHASONE SODIUM PHOSPHATE 10 MG/ML IJ SOLN
INTRAMUSCULAR | Status: AC
  Filled 2019-10-19: qty 1

## 2019-10-19 MED ORDER — CHLORHEXIDINE GLUCONATE 0.12 % MT SOLN
15.0000 mL | Freq: Once | OROMUCOSAL | Status: AC
  Administered 2019-10-19: 15 mL via OROMUCOSAL

## 2019-10-19 MED ORDER — CEFAZOLIN SODIUM-DEXTROSE 2-4 GM/100ML-% IV SOLN
2.0000 g | INTRAVENOUS | Status: AC
  Administered 2019-10-19: 2 g via INTRAVENOUS
  Filled 2019-10-19: qty 100

## 2019-10-19 MED ORDER — SODIUM CHLORIDE 0.9% FLUSH: 3.0000 mL | Freq: Two times a day (BID) | INTRAVENOUS | Status: DC

## 2019-10-19 MED ORDER — ONDANSETRON HCL 4 MG/2ML IJ SOLN
INTRAMUSCULAR | Status: DC | PRN
  Administered 2019-10-19: 4 mg via INTRAVENOUS

## 2019-10-19 MED ORDER — BUPIVACAINE-EPINEPHRINE 0.25% -1:200000 IJ SOLN
INTRAMUSCULAR | Status: DC | PRN
  Administered 2019-10-19: 30 mL

## 2019-10-19 MED ORDER — SCOPOLAMINE 1 MG/3DAYS TD PT72
1.0000 | MEDICATED_PATCH | Freq: Once | TRANSDERMAL | Status: DC
  Administered 2019-10-19: 1.5 mg via TRANSDERMAL
  Filled 2019-10-19: qty 1

## 2019-10-19 MED ORDER — LACTATED RINGERS IV SOLN: INTRAVENOUS | Status: DC

## 2019-10-19 MED ORDER — SUGAMMADEX SODIUM 200 MG/2ML IV SOLN
INTRAVENOUS | Status: DC | PRN
  Administered 2019-10-19: 200 mg via INTRAVENOUS

## 2019-10-19 MED ORDER — CHLORHEXIDINE GLUCONATE 4 % EX LIQD: 60.0000 mL | Freq: Once | CUTANEOUS | Status: DC

## 2019-10-19 MED ORDER — ONDANSETRON HCL 4 MG/2ML IJ SOLN
INTRAMUSCULAR | Status: AC
  Filled 2019-10-19: qty 2

## 2019-10-19 MED ORDER — DEXAMETHASONE SODIUM PHOSPHATE 10 MG/ML IJ SOLN
INTRAMUSCULAR | Status: DC | PRN
  Administered 2019-10-19: 5 mg via INTRAVENOUS

## 2019-10-19 MED ORDER — SODIUM CHLORIDE 0.9% FLUSH: 3.0000 mL | INTRAVENOUS | Status: DC | PRN

## 2019-10-19 MED ORDER — LIDOCAINE 2% (20 MG/ML) 5 ML SYRINGE
INTRAMUSCULAR | Status: AC
  Filled 2019-10-19: qty 5

## 2019-10-19 MED ORDER — OXYCODONE HCL 5 MG PO TABS: 5.0000 mg | ORAL_TABLET | ORAL | Status: DC | PRN

## 2019-10-19 MED ORDER — OXYCODONE HCL 5 MG PO TABS: 5.0000 mg | ORAL_TABLET | Freq: Once | ORAL | Status: DC | PRN

## 2019-10-19 MED ORDER — MIDAZOLAM HCL 5 MG/5ML IJ SOLN
INTRAMUSCULAR | Status: DC | PRN
  Administered 2019-10-19: 2 mg via INTRAVENOUS

## 2019-10-19 MED ORDER — MIDAZOLAM HCL 2 MG/2ML IJ SOLN
INTRAMUSCULAR | Status: AC
  Filled 2019-10-19: qty 2

## 2019-10-19 MED ORDER — HYDROMORPHONE HCL 1 MG/ML IJ SOLN
0.2500 mg | INTRAMUSCULAR | Status: DC | PRN
  Administered 2019-10-19: 0.5 mg via INTRAVENOUS

## 2019-10-19 MED ORDER — LACTATED RINGERS IR SOLN
Status: DC | PRN
  Administered 2019-10-19: 1000 mL

## 2019-10-19 MED ORDER — FENTANYL CITRATE (PF) 250 MCG/5ML IJ SOLN
INTRAMUSCULAR | Status: AC
  Filled 2019-10-19: qty 5

## 2019-10-19 MED ORDER — ACETAMINOPHEN 500 MG PO TABS
1000.0000 mg | ORAL_TABLET | ORAL | Status: AC
  Administered 2019-10-19: 1000 mg via ORAL
  Filled 2019-10-19: qty 2

## 2019-10-19 MED ORDER — ACETAMINOPHEN 650 MG RE SUPP
650.0000 mg | RECTAL | Status: DC | PRN
  Filled 2019-10-19: qty 1

## 2019-10-19 MED ORDER — FENTANYL CITRATE (PF) 250 MCG/5ML IJ SOLN
INTRAMUSCULAR | Status: DC | PRN
  Administered 2019-10-19 (×3): 50 ug via INTRAVENOUS

## 2019-10-19 MED ORDER — ORAL CARE MOUTH RINSE: 15.0000 mL | Freq: Once | OROMUCOSAL | Status: AC

## 2019-10-19 MED ORDER — PROPOFOL 10 MG/ML IV BOLUS
INTRAVENOUS | Status: DC | PRN
  Administered 2019-10-19: 130 mg via INTRAVENOUS

## 2019-10-19 MED ORDER — DOCUSATE SODIUM 100 MG PO CAPS
100.0000 mg | ORAL_CAPSULE | Freq: Two times a day (BID) | ORAL | 0 refills | Status: AC
Start: 2019-10-19 — End: 2019-11-18

## 2019-10-19 MED ORDER — OXYCODONE HCL 5 MG/5ML PO SOLN: 5.0000 mg | Freq: Once | ORAL | Status: DC | PRN

## 2019-10-19 MED ORDER — ACETAMINOPHEN 325 MG PO TABS: 650.0000 mg | ORAL_TABLET | ORAL | Status: DC | PRN

## 2019-10-19 MED ORDER — LIDOCAINE 2% (20 MG/ML) 5 ML SYRINGE
INTRAMUSCULAR | Status: DC | PRN
  Administered 2019-10-19: 40 mg via INTRAVENOUS

## 2019-10-19 MED ORDER — MIDAZOLAM HCL 2 MG/2ML IJ SOLN: 0.5000 mg | Freq: Once | INTRAMUSCULAR | Status: DC | PRN

## 2019-10-19 MED ORDER — HYDROMORPHONE HCL 1 MG/ML IJ SOLN
INTRAMUSCULAR | Status: AC
  Filled 2019-10-19: qty 1

## 2019-10-19 MED ORDER — 0.9 % SODIUM CHLORIDE (POUR BTL) OPTIME
TOPICAL | Status: DC | PRN
  Administered 2019-10-19: 1000 mL

## 2019-10-19 MED ORDER — ROCURONIUM BROMIDE 10 MG/ML (PF) SYRINGE
PREFILLED_SYRINGE | INTRAVENOUS | Status: DC | PRN
  Administered 2019-10-19: 50 mg via INTRAVENOUS

## 2019-10-19 MED ORDER — SODIUM CHLORIDE 0.9 % IV SOLN: 250.0000 mL | INTRAVENOUS | Status: DC | PRN

## 2019-10-19 MED ORDER — TRAMADOL HCL 50 MG PO TABS: 50.0000 mg | ORAL_TABLET | Freq: Four times a day (QID) | ORAL | 0 refills | Status: DC | PRN

## 2019-10-19 MED ORDER — ROCURONIUM BROMIDE 10 MG/ML (PF) SYRINGE
PREFILLED_SYRINGE | INTRAVENOUS | Status: AC
  Filled 2019-10-19: qty 10

## 2019-10-19 MED ORDER — BUPIVACAINE LIPOSOME 1.3 % IJ SUSP
INTRAMUSCULAR | Status: DC | PRN
  Administered 2019-10-19: 20 mL

## 2019-10-19 MED ORDER — MEPERIDINE HCL 50 MG/ML IJ SOLN: 6.2500 mg | INTRAMUSCULAR | Status: DC | PRN

## 2019-10-19 MED ORDER — FENTANYL CITRATE (PF) 100 MCG/2ML IJ SOLN: 25.0000 ug | INTRAMUSCULAR | Status: DC | PRN

## 2019-10-19 SURGICAL SUPPLY — 34 items
APPLIER CLIP ROT 10 11.4 M/L (STAPLE) ×3
CABLE HIGH FREQUENCY MONO STRZ (ELECTRODE) ×3 IMPLANT
CHLORAPREP W/TINT 26 (MISCELLANEOUS) ×3 IMPLANT
CLIP APPLIE ROT 10 11.4 M/L (STAPLE) ×1 IMPLANT
COVER MAYO STAND STRL (DRAPES) IMPLANT
COVER SURGICAL LIGHT HANDLE (MISCELLANEOUS) ×3 IMPLANT
COVER WAND RF STERILE (DRAPES) IMPLANT
DECANTER SPIKE VIAL GLASS SM (MISCELLANEOUS) ×3 IMPLANT
DERMABOND ADVANCED (GAUZE/BANDAGES/DRESSINGS) ×2
DERMABOND ADVANCED .7 DNX12 (GAUZE/BANDAGES/DRESSINGS) ×1 IMPLANT
DRAPE C-ARM 42X120 X-RAY (DRAPES) IMPLANT
ELECT REM PT RETURN 15FT ADLT (MISCELLANEOUS) ×3 IMPLANT
GLOVE BIO SURGEON STRL SZ 6 (GLOVE) ×3 IMPLANT
GLOVE INDICATOR 6.5 STRL GRN (GLOVE) ×3 IMPLANT
GOWN STRL REUS W/TWL LRG LVL3 (GOWN DISPOSABLE) ×3 IMPLANT
GOWN STRL REUS W/TWL XL LVL3 (GOWN DISPOSABLE) ×6 IMPLANT
GRASPER SUT TROCAR 14GX15 (MISCELLANEOUS) ×3 IMPLANT
HEMOSTAT SNOW SURGICEL 2X4 (HEMOSTASIS) IMPLANT
KIT BASIN OR (CUSTOM PROCEDURE TRAY) ×3 IMPLANT
KIT TURNOVER KIT A (KITS) IMPLANT
NEEDLE INSUFFLATION 14GA 120MM (NEEDLE) ×3 IMPLANT
PENCIL SMOKE EVACUATOR (MISCELLANEOUS) IMPLANT
POUCH SPECIMEN RETRIEVAL 10MM (ENDOMECHANICALS) ×3 IMPLANT
SCISSORS LAP 5X35 DISP (ENDOMECHANICALS) ×3 IMPLANT
SET CHOLANGIOGRAPH MIX (MISCELLANEOUS) IMPLANT
SET IRRIG TUBING LAPAROSCOPIC (IRRIGATION / IRRIGATOR) ×3 IMPLANT
SET TUBE SMOKE EVAC HIGH FLOW (TUBING) ×3 IMPLANT
SLEEVE XCEL OPT CAN 5 100 (ENDOMECHANICALS) ×6 IMPLANT
SUT MNCRL AB 4-0 PS2 18 (SUTURE) ×3 IMPLANT
TOWEL OR 17X26 10 PK STRL BLUE (TOWEL DISPOSABLE) ×3 IMPLANT
TOWEL OR NON WOVEN STRL DISP B (DISPOSABLE) IMPLANT
TRAY LAPAROSCOPIC (CUSTOM PROCEDURE TRAY) ×3 IMPLANT
TROCAR BLADELESS OPT 5 100 (ENDOMECHANICALS) ×3 IMPLANT
TROCAR XCEL 12X100 BLDLESS (ENDOMECHANICALS) ×3 IMPLANT

## 2019-10-19 NOTE — Anesthesia Preprocedure Evaluation (Addendum)
Anesthesia Evaluation  Patient identified by MRN, date of birth, ID band Patient awake    Reviewed: Allergy & Precautions, NPO status , Patient's Chart, lab work & pertinent test results  History of Anesthesia Complications Negative for: history of anesthetic complications  Airway Mallampati: II  TM Distance: >3 FB Neck ROM: Full    Dental  (+) Dental Advisory Given, Chipped, Loose   Pulmonary neg pulmonary ROS,  10/16/2019 SARS coronavirus NEG   breath sounds clear to auscultation       Cardiovascular negative cardio ROS   Rhythm:Regular Rate:Normal     Neuro/Psych  Headaches, Anxiety Depression    GI/Hepatic Neg liver ROS, GERD  Medicated and Controlled,  Endo/Other  negative endocrine ROS  Renal/GU negative Renal ROS     Musculoskeletal   Abdominal   Peds  Hematology negative hematology ROS (+)   Anesthesia Other Findings   Reproductive/Obstetrics                            Anesthesia Physical Anesthesia Plan  ASA: II  Anesthesia Plan: General   Post-op Pain Management:    Induction: Intravenous  PONV Risk Score and Plan: 3 and Scopolamine patch - Pre-op, Dexamethasone and Ondansetron  Airway Management Planned: Oral ETT  Additional Equipment: None  Intra-op Plan:   Post-operative Plan: Extubation in OR  Informed Consent: I have reviewed the patients History and Physical, chart, labs and discussed the procedure including the risks, benefits and alternatives for the proposed anesthesia with the patient or authorized representative who has indicated his/her understanding and acceptance.     Dental advisory given  Plan Discussed with: CRNA and Surgeon  Anesthesia Plan Comments:         Anesthesia Quick Evaluation

## 2019-10-19 NOTE — Anesthesia Postprocedure Evaluation (Signed)
Anesthesia Post Note  Patient: Safina Ramsey-Peralta  Procedure(s) Performed: LAPAROSCOPIC CHOLECYSTECTOMY (N/A Abdomen)     Patient location during evaluation: Phase II Anesthesia Type: General Level of consciousness: awake and alert, oriented and patient cooperative Pain management: pain level controlled Vital Signs Assessment: post-procedure vital signs reviewed and stable Respiratory status: spontaneous breathing, nonlabored ventilation and respiratory function stable Cardiovascular status: blood pressure returned to baseline and stable Postop Assessment: no apparent nausea or vomiting, adequate PO intake and able to ambulate Anesthetic complications: no   No complications documented.  Last Vitals:  Vitals:   10/19/19 1258 10/19/19 1319  BP: 114/77 118/77  Pulse: 77 85  Resp: 16 14  Temp:  36.7 C  SpO2: 96% 99%    Last Pain:  Vitals:   10/19/19 1319  TempSrc: Oral  PainSc: 5                  Shigeo Baugh,E. Chantrice Hagg

## 2019-10-19 NOTE — Transfer of Care (Signed)
Immediate Anesthesia Transfer of Care Note  Patient: Norah Ramsey-Peralta  Procedure(s) Performed: LAPAROSCOPIC CHOLECYSTECTOMY (N/A Abdomen)  Patient Location: PACU  Anesthesia Type:General  Level of Consciousness: sedated  Airway & Oxygen Therapy: Patient Spontanous Breathing and Patient connected to face mask oxygen  Post-op Assessment: Report given to RN and Post -op Vital signs reviewed and stable  Post vital signs: Reviewed and stable  Last Vitals:  Vitals Value Taken Time  BP    Temp    Pulse    Resp    SpO2      Last Pain:  Vitals:   10/19/19 1038  TempSrc:   PainSc: 0-No pain      Patients Stated Pain Goal: 4 (10/19/19 1038)  Complications: No complications documented.

## 2019-10-19 NOTE — Op Note (Signed)
Operative Note  Tracy Mcconnell 24 y.o. female [id number]  10/19/2019  Surgeon: Berna Bue MD FACS  Assistant: Axel Filler MD FACS  Procedure performed: Laparoscopic Cholecystectomy  Preop diagnosis: biliary colic Post-op diagnosis/intraop findings: same  Specimens: gallbladder  Retained items: none  EBL: minimal  Complications: none  Description of procedure: After obtaining informed consent the patient was brought to the operating room. Antibiotics were administered. SCD's were applied. General endotracheal anesthesia was initiated and a formal time-out was performed. The abdomen was prepped and draped in the usual sterile fashion and the abdomen was entered using an infraumbilical veress needle after instilling the site with local. Insufflation to was obtained, 42mm trocar and camera inserted, and gross inspection revealed no evidence of injury from our entry or other intraabdominal abnormalities. Two 56mm trocars were introduced in the right midclavicular and right anterior axillary lines under direct visualization and following infiltration with local. An 61mm trocar was placed in the epigastrium. The gallbladder was retracted cephalad and the infundibulum was retracted laterally. A combination of hook electrocautery and blunt dissection was utilized to clear the peritoneum from the gallbladder neck and cystic duct, circumferentially isolating the cystic artery and cystic duct and lifting the gallbladder from the cystic plate. The critical view of safety was achieved with the cystic artery, cystic duct, and liver bed visualized between them with no other structures. The artery was clipped with a single clip proximally and divided as was the cystic duct with three clips on the proximal end. The gallbladder was dissected from the liver plate using electrocautery. Once freed the gallbladder was placed in an endocatch bag and removed intact through the epigastric trocar  site. A minimal amount of bleeding on the liver bed was controlled with cautery. Hemostasis was once again confirmed, and reinspection of the abdomen revealed no injuries. The clips were well opposed without any bile leak from the duct or the liver bed. The 56mm trocar site in the epigastrium was closed with a 0 vicryl in the fascia under direct visualization using a PMI device. The abdomen was desufflated and all trocars removed. The skin incisions were closed with subcuticular monocryl and Dermabond. The patient was awakened, extubated and transported to the recovery room in stable condition.    All counts were correct at the completion of the case.

## 2019-10-19 NOTE — Anesthesia Procedure Notes (Signed)
Procedure Name: Intubation Date/Time: 10/19/2019 11:21 AM Performed by: Talbot Grumbling, CRNA Pre-anesthesia Checklist: Patient identified, Emergency Drugs available, Suction available and Patient being monitored Patient Re-evaluated:Patient Re-evaluated prior to induction Oxygen Delivery Method: Circle system utilized Preoxygenation: Pre-oxygenation with 100% oxygen Induction Type: IV induction Ventilation: Mask ventilation without difficulty Laryngoscope Size: Mac and 3 Grade View: Grade I Tube type: Oral Tube size: 7.0 mm Number of attempts: 1 Airway Equipment and Method: Stylet Placement Confirmation: ETT inserted through vocal cords under direct vision,  positive ETCO2 and breath sounds checked- equal and bilateral Secured at: 20 cm Tube secured with: Tape Dental Injury: Teeth and Oropharynx as per pre-operative assessment

## 2019-10-19 NOTE — Discharge Instructions (Signed)
LAPAROSCOPIC SURGERY: POST OP INSTRUCTIONS  ######################################################################  EAT Gradually transition to a high fiber diet with a fiber supplement over the next few weeks after discharge.  Start with a pureed / full liquid diet (see below)  WALK Walk an hour a day (cumulative).  Control your pain to do that.    CONTROL PAIN Control pain so that you can walk, sleep, tolerate sneezing/coughing, go up/down stairs.  HAVE A BOWEL MOVEMENT DAILY Keep your bowels regular to avoid problems.  OK to try a laxative to override constipation.  OK to use an antidairrheal to slow down diarrhea.  Call if not better after 2 tries  CALL IF YOU HAVE PROBLEMS/CONCERNS Call if you are still struggling despite following these instructions. Call if you have concerns not answered by these instructions  ######################################################################    1. DIET: Follow a light bland diet & liquids the first 24 hours after arrival home, such as soup, liquids, starches, etc.  Be sure to drink plenty of fluids.  Quickly advance to a usual solid diet within a few days.  Avoid fast food or heavy meals as your are more likely to get nauseated or have irregular bowels.  A low-sugar, high-fiber diet for the rest of your life is ideal.  2. Take your usually prescribed home medications unless otherwise directed.  3. PAIN CONTROL: a. Pain is best controlled by a usual combination of three different methods TOGETHER: i. Ice/Heat ii. Over the counter pain medication iii. Prescription pain medication b. Most patients will experience some swelling and bruising around the incisions.  Ice packs or heating pads (30-60 minutes up to 6 times a day) will help. Use ice for the first few days to help decrease swelling and bruising, then switch to heat to help relax tight/sore spots and speed recovery.  Some people prefer to use ice alone, heat alone, alternating between  ice & heat.  Experiment to what works for you.  Swelling and bruising can take several weeks to resolve.   c. It is helpful to take an over-the-counter pain medication regularly for the first few days: i. Naproxen (Aleve, etc)  Two 220mg tabs twice a day OR Ibuprofen (Advil, etc) Three 200mg tabs four times a day (every meal & bedtime) AND ii. Acetaminophen (Tylenol, etc) 500-650mg four times a day (every meal & bedtime) d. A  prescription for pain medication (such as oxycodone, hydrocodone, tramadol, gabapentin, methocarbamol, etc) should be given to you upon discharge.  Take your pain medication as prescribed.  i. If you are having problems/concerns with the prescription medicine (does not control pain, nausea, vomiting, rash, itching, etc), please call us [phone] to see if we need to switch you to a different pain medicine that will work better for you and/or control your side effect better. ii. If you need a refill on your pain medication, please give us 48 hour notice.  contact your pharmacy.  They will contact our office to request authorization. Prescriptions will not be filled after 5 pm or on week-ends  4. Avoid getting constipated.   a. Between the surgery and the pain medications, it is common to experience some constipation.   b. Increasing fluid intake and taking a fiber supplement (such as Metamucil, Citrucel, FiberCon, MiraLax, etc) 1-2 times a day regularly will usually help prevent this problem from occurring.   c. A mild laxative (prune juice, Milk of Magnesia, MiraLax, etc) should be taken according to package directions if there are no bowel movements after   48 hours.   5. Watch out for diarrhea.   a. If you have many loose bowel movements, simplify your diet to bland foods & liquids for a few days.   b. Stop any stool softeners and decrease your fiber supplement.   c. Switching to mild anti-diarrheal medications (Kayopectate, Pepto Bismol) can help.   d. If this worsens or  does not improve, please call us.  6. Wash / shower every day.  You may shower over the skin glue which is waterproof.  Continue to shower over incision(s) after the dressing is off.  7. Glue will flake off after about 2 weeks.  You may leave the incision open to air.  You may replace a dressing/Band-Aid to cover the incision for comfort if you wish.   8. ACTIVITIES as tolerated:   a. You may resume regular (light) daily activities beginning the next day--such as daily self-care, walking, climbing stairs--gradually increasing activities as tolerated.  If you can walk 30 minutes without difficulty, it is safe to try more intense activity such as jogging, treadmill, bicycling, low-impact aerobics, swimming, etc. b. Save the most intensive and strenuous activity for last such as sit-ups, heavy lifting, contact sports, etc  Refrain from any heavy lifting or straining until you are off narcotics for pain control.   c. DO NOT PUSH THROUGH PAIN.  Let pain be your guide: If it hurts to do something, don't do it.  Pain is your body warning you to avoid that activity for another week until the pain goes down. d. You may drive when you are no longer taking prescription pain medication, you can comfortably wear a seatbelt, and you can safely maneuver your car and apply brakes. e. You may have sexual intercourse when it is comfortable.  9. FOLLOW UP in our office a. Please call [facility] at [phone] to set up an appointment to see your surgeon in the office for a follow-up appointment approximately 2-3 weeks after your surgery. b. Make sure that you call for this appointment the day you arrive home to insure a convenient appointment time.  10. IF YOU HAVE DISABILITY OR FAMILY LEAVE FORMS, BRING THEM TO THE OFFICE FOR PROCESSING.  DO NOT GIVE THEM TO YOUR DOCTOR.   WHEN TO CALL US [phone]: 1. Poor pain control 2. Reactions / problems with new medications (rash/itching, nausea, etc)  3. Fever over  101.5 F (38.5 C) 4. Inability to urinate 5. Nausea and/or vomiting 6. Worsening swelling or bruising 7. Continued bleeding from incision. 8. Increased pain, redness, or drainage from the incision   The clinic staff is available to answer your questions during regular business hours (8:30am-5pm).  Please don't hesitate to call and ask to speak to one of our nurses for clinical concerns.   If you have a medical emergency, go to the nearest emergency room or call 911.  A surgeon from [facility] is always on call at the hospitals   [facility] [address], [address], [place], [place]  [address] ? MAIN: [phone] ? TOLL FREE: [phone] ?  FAX [phone] [email or web address]   

## 2019-10-19 NOTE — Interval H&P Note (Signed)
History and Physical Interval Note:  10/19/2019 10:35 AM  Tracy Mcconnell  has presented today for surgery, with the diagnosis of BILIARY COLIC.  The various methods of treatment have been discussed with the patient and family. After consideration of risks, benefits and other options for treatment, the patient has consented to  Procedure(s): LAPAROSCOPIC CHOLECYSTECTOMY (N/A) as a surgical intervention.  The patient's history has been reviewed, patient examined, no change in status, stable for surgery.  I have reviewed the patient's chart and labs.  Questions were answered to the patient's satisfaction.     Noelie Renfrow Lollie Sails

## 2019-10-20 ENCOUNTER — Encounter (HOSPITAL_COMMUNITY): Payer: Self-pay | Admitting: Surgery

## 2019-10-20 ENCOUNTER — Other Ambulatory Visit: Payer: Self-pay | Admitting: Registered Nurse

## 2019-10-20 DIAGNOSIS — M549 Dorsalgia, unspecified: Secondary | ICD-10-CM

## 2019-10-20 LAB — SURGICAL PATHOLOGY

## 2019-10-20 MED ORDER — OXYCODONE HCL 5 MG PO TABS: 5.0000 mg | ORAL_TABLET | ORAL | 0 refills | Status: DC | PRN

## 2019-10-20 NOTE — Telephone Encounter (Signed)
Patient is requesting a refill of the following medications: Requested Prescriptions   Pending Prescriptions Disp Refills   oxyCODONE (ROXICODONE) 5 MG immediate release tablet 12 tablet 0    Sig: Take 1 tablet (5 mg total) by mouth every 4 (four) hours as needed for severe pain.    Date of patient request: 10/20/19 Last office visit: 10/16/19 Date of last refill: 10/02/19 Last refill amount: 12 Follow up time period per chart: 11/11/19

## 2019-10-22 ENCOUNTER — Emergency Department (HOSPITAL_COMMUNITY): Payer: Medicaid Other

## 2019-10-22 ENCOUNTER — Encounter (HOSPITAL_COMMUNITY): Payer: Self-pay

## 2019-10-22 ENCOUNTER — Emergency Department (HOSPITAL_COMMUNITY)
Admission: EM | Admit: 2019-10-22 | Discharge: 2019-10-23 | Disposition: A | Discharge status: Home / Self Care | Payer: Medicaid Other | Attending: Emergency Medicine | Admitting: Emergency Medicine

## 2019-10-22 DIAGNOSIS — Z5321 Procedure and treatment not carried out due to patient leaving prior to being seen by health care provider: Secondary | ICD-10-CM | POA: Diagnosis not present

## 2019-10-22 DIAGNOSIS — Z9049 Acquired absence of other specified parts of digestive tract: Secondary | ICD-10-CM | POA: Diagnosis not present

## 2019-10-22 DIAGNOSIS — H538 Other visual disturbances: Secondary | ICD-10-CM | POA: Diagnosis not present

## 2019-10-22 DIAGNOSIS — H47292 Other optic atrophy, left eye: Secondary | ICD-10-CM | POA: Diagnosis not present

## 2019-10-22 DIAGNOSIS — R079 Chest pain, unspecified: Secondary | ICD-10-CM | POA: Insufficient documentation

## 2019-10-22 DIAGNOSIS — R918 Other nonspecific abnormal finding of lung field: Secondary | ICD-10-CM | POA: Diagnosis not present

## 2019-10-22 DIAGNOSIS — H468 Other optic neuritis: Secondary | ICD-10-CM | POA: Diagnosis not present

## 2019-10-22 DIAGNOSIS — R0600 Dyspnea, unspecified: Secondary | ICD-10-CM | POA: Diagnosis not present

## 2019-10-22 LAB — CBC
HCT: 42.2 % (ref 36.0–46.0)
Hemoglobin: 13.6 g/dL (ref 12.0–15.0)
MCH: 29.4 pg (ref 26.0–34.0)
MCHC: 32.2 g/dL (ref 30.0–36.0)
MCV: 91.3 fL (ref 80.0–100.0)
Platelets: 356 10*3/uL (ref 150–400)
RBC: 4.62 MIL/uL (ref 3.87–5.11)
RDW: 11.7 % (ref 11.5–15.5)
WBC: 10.2 10*3/uL (ref 4.0–10.5)
nRBC: 0 % (ref 0.0–0.2)

## 2019-10-22 LAB — TROPONIN I (HIGH SENSITIVITY)
Troponin I (High Sensitivity): <2 ng/L (ref <18)
Troponin I (High Sensitivity): <2 ng/L (ref <18)

## 2019-10-22 LAB — I-STAT BETA HCG BLOOD, ED (MC, WL, AP ONLY): I-stat hCG, quantitative: <5 m[IU]/mL (ref <5)

## 2019-10-22 LAB — BASIC METABOLIC PANEL
Anion gap: 11 (ref 5–15)
BUN: 8 mg/dL (ref 6–20)
CO2: 23 mmol/L (ref 22–32)
Calcium: 9.5 mg/dL (ref 8.9–10.3)
Chloride: 102 mmol/L (ref 98–111)
Creatinine, Ser: 0.78 mg/dL (ref 0.44–1.00)
GFR calc Af Amer: >60 mL/min (ref >60)
GFR calc non Af Amer: >60 mL/min (ref >60)
Glucose, Bld: 137 mg/dL — ABNORMAL HIGH (ref 70–99)
Potassium: 3.4 mmol/L — ABNORMAL LOW (ref 3.5–5.1)
Sodium: 136 mmol/L (ref 135–145)

## 2019-10-22 MED ORDER — IOHEXOL 350 MG/ML SOLN
75.0000 mL | Freq: Once | INTRAVENOUS | Status: AC | PRN
  Administered 2019-10-22: 75 mL via INTRAVENOUS

## 2019-10-22 MED ORDER — SODIUM CHLORIDE 0.9% FLUSH: 3.0000 mL | Freq: Once | INTRAVENOUS | Status: DC

## 2019-10-22 NOTE — ED Notes (Signed)
Pt leaving AMA, advised to return if symptoms worsen. 

## 2019-10-22 NOTE — ED Triage Notes (Signed)
Pt arrives to ED w/ c/o 8/10 chest pain that is worse w/ inspiration that started yesterday. Pt had gallbladder surgery on Monday.

## 2019-11-01 DIAGNOSIS — Z419 Encounter for procedure for purposes other than remedying health state, unspecified: Secondary | ICD-10-CM | POA: Diagnosis not present

## 2019-11-11 ENCOUNTER — Other Ambulatory Visit: Payer: Self-pay

## 2019-11-11 ENCOUNTER — Ambulatory Visit (INDEPENDENT_AMBULATORY_CARE_PROVIDER_SITE_OTHER): Payer: Medicaid Other | Admitting: Registered Nurse

## 2019-11-11 VITALS — BP 114/77 | HR 74 | Temp 97.6°F | Ht 61.0 in | Wt 125.0 lb

## 2019-11-11 DIAGNOSIS — F411 Generalized anxiety disorder: Secondary | ICD-10-CM

## 2019-11-11 DIAGNOSIS — Z7689 Persons encountering health services in other specified circumstances: Secondary | ICD-10-CM

## 2019-11-11 MED ORDER — FLUOXETINE HCL 40 MG PO CAPS: 40.0000 mg | ORAL_CAPSULE | Freq: Every morning | ORAL | 3 refills | Status: DC

## 2019-11-11 NOTE — Patient Instructions (Signed)
° ° ° °  If you have lab work done today you will be contacted with your lab results within the next 2 weeks.  If you have not heard from us then please contact us. The fastest way to get your results is to register for My Chart. ° ° °IF you received an x-ray today, you will receive an invoice from [facility]. Please contact [facility] at [phone] with questions or concerns regarding your invoice.  ° °IF you received labwork today, you will receive an invoice from [facility]. Please contact [facility] at [phone] with questions or concerns regarding your invoice.  ° °Our billing staff will not be able to assist you with questions regarding bills from these companies. ° °You will be contacted with the lab results as soon as they are available. The fastest way to get your results is to activate your My Chart account. Instructions are located on the last page of this paperwork. If you have not heard from us regarding the results in 2 weeks, please contact this office. °  ° ° ° °

## 2019-12-01 ENCOUNTER — Encounter: Payer: Self-pay | Admitting: Registered Nurse

## 2019-12-01 NOTE — Telephone Encounter (Signed)
Called patient to see if I could set her up an appointment.

## 2019-12-02 DIAGNOSIS — Z419 Encounter for procedure for purposes other than remedying health state, unspecified: Secondary | ICD-10-CM | POA: Diagnosis not present

## 2019-12-05 ENCOUNTER — Telehealth: Payer: Medicaid Other | Admitting: Family

## 2019-12-05 DIAGNOSIS — M5440 Lumbago with sciatica, unspecified side: Secondary | ICD-10-CM

## 2019-12-05 MED ORDER — CYCLOBENZAPRINE HCL 10 MG PO TABS
10.0000 mg | ORAL_TABLET | Freq: Three times a day (TID) | ORAL | 0 refills | Status: DC | PRN
Start: 2019-12-05 — End: 2020-03-02

## 2019-12-05 NOTE — Progress Notes (Signed)
We are sorry that you are not feeling well.  Here is how we plan to help!  Based on what you have shared with me it looks like you mostly have acute back pain.  Acute back pain is defined as musculoskeletal pain that can resolve in 1-3 weeks with conservative treatment.  I have prescribed Flexeril 10 mg every eight hours as needed which is a muscle relaxer  Please keep in mind that muscle relaxer's can cause fatigue and should not be taken while at work or driving.  Back pain is very common.  The pain often gets better over time.  The cause of back pain is usually not dangerous.  Most people can learn to manage their back pain on their own.  Home Care  Stay active.  Start with short walks on flat ground if you can.  Try to walk farther each day.  Do not sit, drive or stand in one place for more than 30 minutes.  Do not stay in bed.  Do not avoid exercise or work.  Activity can help your back heal faster.  Be careful when you bend or lift an object.  Bend at your knees, keep the object close to you, and do not twist.  Sleep on a firm mattress.  Lie on your side, and bend your knees.  If you lie on your back, put a pillow under your knees.  Only take medicines as told by your doctor.  Put ice on the injured area.  Put ice in a plastic bag  Place a towel between your skin and the bag  Leave the ice on for 15-20 minutes, 3-4 times a day for the first 2-3 days. 210 After that, you can switch between ice and heat packs.  Ask your doctor about back exercises or massage.  Avoid feeling anxious or stressed.  Find good ways to deal with stress, such as exercise.  Get Help Right Way If:  Your pain does not go away with rest or medicine.  Your pain does not go away in 1 week.  You have new problems.  You do not feel well.  The pain spreads into your legs.  You cannot control when you poop (bowel movement) or pee (urinate)  You feel sick to your stomach (nauseous) or throw up  (vomit)  You have belly (abdominal) pain.  You feel like you may pass out (faint).  If you develop a fever.  Make Sure you:  Understand these instructions.  Will watch your condition  Will get help right away if you are not doing well or get worse.  Your e-visit answers were reviewed by a board certified advanced clinical practitioner to complete your personal care plan.  Depending on the condition, your plan could have included both over the counter or prescription medications.  If there is a problem please reply  once you have received a response from your provider.  Your safety is important to us.  If you have drug allergies check your prescription carefully.    You can use MyChart to ask questions about today's visit, request a non-urgent call back, or ask for a work or school excuse for 24 hours related to this e-Visit. If it has been greater than 24 hours you will need to follow up with your provider, or enter a new e-Visit to address those concerns.  You will get an e-mail in the next two days asking about your experience.  I hope that your e-visit has been   valuable and will speed your recovery. Thank you for using e-visits. Greater than 5 minutes, yet less than 10 minutes of time have been spent researching, coordinating, and implementing care for this patient today.  Thank you for the details you included in the comment boxes. Those details are very helpful in determining the best course of treatment for you and help us to provide the best care.  

## 2019-12-09 ENCOUNTER — Encounter: Payer: Self-pay | Admitting: Family Medicine

## 2019-12-09 ENCOUNTER — Other Ambulatory Visit: Payer: Self-pay | Admitting: Family Medicine

## 2019-12-09 MED ORDER — NURTEC 75 MG PO TBDP
75.0000 mg | ORAL_TABLET | Freq: Every day | ORAL | 11 refills | Status: DC | PRN
Start: 2019-12-09 — End: 2020-03-21

## 2019-12-15 ENCOUNTER — Encounter: Payer: Self-pay | Admitting: Registered Nurse

## 2019-12-16 ENCOUNTER — Encounter: Payer: Self-pay | Admitting: Neurology

## 2019-12-16 ENCOUNTER — Ambulatory Visit: Payer: Medicaid Other | Admitting: Neurology

## 2019-12-16 ENCOUNTER — Other Ambulatory Visit: Payer: Self-pay

## 2019-12-16 VITALS — BP 107/70 | HR 86 | Ht 61.0 in | Wt 130.0 lb

## 2019-12-16 DIAGNOSIS — R9082 White matter disease, unspecified: Secondary | ICD-10-CM | POA: Diagnosis not present

## 2019-12-16 DIAGNOSIS — G43709 Chronic migraine without aura, not intractable, without status migrainosus: Secondary | ICD-10-CM | POA: Diagnosis not present

## 2019-12-16 DIAGNOSIS — H469 Unspecified optic neuritis: Secondary | ICD-10-CM | POA: Diagnosis not present

## 2019-12-16 NOTE — Progress Notes (Signed)
[facility]  PATIENT: Tracy Mcconnell DOB: [birth year]  REFERRING DOCTOR OR PCP: Dr. Lucia Gaskins and neurology);  PCP is [facility] SOURCE: Patient, notes from neurology and hospital, imaging and lab reports, MRI images personally reviewed  _________________________________   HISTORICAL  CHIEF COMPLAINT:  Chief Complaint  Patient presents with  . Optic Neuritis    6 month FU  . Migraine    HISTORY OF PRESENT ILLNESS:  Tracy Mcconnell is a 24 y.o. woman with left optic neuritis and abnormal brain MRi  Update 12/16/2019: She reports the left vision has improved but not to baseline.   Colors are still desaturated.  She has had some pain in her right hand that is fluctuating  She gets migraine headaches about 10 days a month.  They are classic with a visual aura for 30-60 minutes before the headache.   HA is associated with nausea, photophobia and phonophobia.  She was recently prescribed Nurtec and that has helped.  Imitrex had not helped.    MRI's form earlier this year were reviewed.   She has only a couple non-specific WM lesions on MRI.    She has had her Covid-19 vaccinations.     Optic Neuritis History: In February 2021, she had the onset of left eye blurry vision and pain, worse with eye movements.    She saw urgent care and then ophthalmology   She was felt to have optic neuritis and presented to the [facility] ED.   MRI of the brain and orbits showed enhancement of the left optic nerve,   There were a few T2 hyperintense foci in the hemispheres, including a periventricular focus in the right frontal lobe and one juxtacortical focus in the right parietal lobe.  None of the brain foci enhanced.  In retrospect, she has numbness and weakness in her legs that lasted a week, left > right in November 2019.   She had an LP 07/01/2019 and CSF showed no OCB and normal IgG Index   Her maternal aunt and two second cousins have MS.    REVIEW OF  SYSTEMS: Constitutional: No fevers, chills, sweats, or change in appetite Eyes: No visual changes, double vision, eye pain Ear, nose and throat: No hearing loss, ear pain, nasal congestion, sore throat Cardiovascular: No chest pain, palpitations Respiratory: No shortness of breath at rest or with exertion.   No wheezes GastrointestinaI: No nausea, vomiting, diarrhea, abdominal pain, fecal incontinence Genitourinary: No dysuria, urinary retention or frequency.  No nocturia. Musculoskeletal: No neck pain, back pain Integumentary: No rash, pruritus, skin lesions Neurological: as above Psychiatric: No depression at this time.  No anxiety Endocrine: No palpitations, diaphoresis, change in appetite, change in weigh or increased thirst Hematologic/Lymphatic: No anemia, purpura, petechiae. Allergic/Immunologic: No itchy/runny eyes, nasal congestion, recent allergic reactions, rashes  ALLERGIES: Allergies  Allergen Reactions  . Sumatriptan Shortness Of Breath and Other (See Comments)    Entire body "felt heavy"  . Other Itching, Swelling and Other (See Comments)    Pesticides (family history)- Exposure face and mouth itch and the tongue/lips swell    HOME MEDICATIONS:  Current Outpatient Medications:  .  buPROPion (WELLBUTRIN XL) 300 MG 24 hr tablet, Take 1 tablet (300 mg total) by mouth in the morning., Disp: 30 tablet, Rfl: 1 .  busPIRone (BUSPAR) 30 MG tablet, Take 30 mg by mouth 2 (two) times daily., Disp: , Rfl:  .  cyclobenzaprine (FLEXERIL) 10 MG tablet, Take 1 tablet (10 mg total) by mouth 3 (  three) times daily as needed for muscle spasms., Disp: 30 tablet, Rfl: 0 .  ergocalciferol (VITAMIN D2) 1.25 MG (50000 UT) capsule, Take 50,000 Units by mouth every Friday. , Disp: , Rfl:  .  FLUoxetine (PROZAC) 40 MG capsule, Take 1 capsule (40 mg total) by mouth in the morning., Disp: 90 capsule, Rfl: 3 .  gabapentin (NEURONTIN) 600 MG tablet, Take 1 tablet (600 mg total) by mouth in the  morning, at noon, in the evening, and at bedtime., Disp: 360 tablet, Rfl: 0 .  LAVENDER OIL PO, Take 1 capsule by mouth daily., Disp: , Rfl:  .  LORazepam (ATIVAN) 1 MG tablet, Take 1 mg by mouth every 6 (six) hours as needed for anxiety., Disp: , Rfl:  .  magnesium oxide (MAG-OX) 400 MG tablet, Take 400 mg by mouth daily., Disp: , Rfl:  .  naproxen sodium (ALEVE) 220 MG tablet, Take 440 mg by mouth 2 (two) times daily as needed (pain)., Disp: , Rfl:  .  omeprazole (PRILOSEC) 20 MG capsule, Take 1 capsule (20 mg total) by mouth daily., Disp: 30 capsule, Rfl: 3 .  OnabotulinumtoxinA (BOTOX IJ), Inject 1 Dose as directed every 3 (three) months., Disp: , Rfl:  .  QUEtiapine (SEROQUEL) 50 MG tablet, 1/2 to 1 tab qhs for sleep (Patient taking differently: Take 50 mg by mouth at bedtime. ), Disp: 30 tablet, Rfl: 0 .  Rimegepant Sulfate (NURTEC) 75 MG TBDP, Take 75 mg by mouth daily as needed (take for abortive therapy of migraine, no more than 1 tablet in 24 hours or 10 per month)., Disp: 8 tablet, Rfl: 11 .  rizatriptan (MAXALT-MLT) 10 MG disintegrating tablet, Take 1 tablet (10 mg total) by mouth as needed for migraine (and may repeat once in 2 hours, if no relief (DISSOLVE IN THE MOUTH)). (Patient taking differently: Take 10 mg by mouth as needed for migraine. May repeat in once in 2 hours.), Disp: 11 tablet, Rfl: 5 .  oxyCODONE (ROXICODONE) 5 MG immediate release tablet, Take 1 tablet (5 mg total) by mouth every 4 (four) hours as needed for severe pain. (Patient not taking: Reported on 12/16/2019), Disp: 12 tablet, Rfl: 0 .  traMADol (ULTRAM) 50 MG tablet, Take 1 tablet (50 mg total) by mouth every 6 (six) hours as needed (pain not relieved by tylenol or ibuprofen). (Patient not taking: Reported on 12/16/2019), Disp: 10 tablet, Rfl: 0  PAST MEDICAL HISTORY: Past Medical History:  Diagnosis Date  . Anemia   . Anxiety   . Depression   . GERD (gastroesophageal reflux disease)   . Hypovitaminosis D  04/07/2019  . Left eye complaint 06/08/2019  . Migraines     PAST SURGICAL HISTORY: Past Surgical History:  Procedure Laterality Date  . CHOLECYSTECTOMY N/A 10/19/2019   Procedure: LAPAROSCOPIC CHOLECYSTECTOMY;  Surgeon: Berna Bue, MD;  Location: [facility];  Service: General;  Laterality: N/A;    FAMILY HISTORY: Family History  Problem Relation Age of Onset  . Migraines Mother   . Thyroid disease Mother   . Cancer Maternal Grandmother   . Diabetes Paternal Grandfather   . Hypertension Maternal Grandfather   . Diabetes Paternal Grandmother   . Stroke Paternal Grandmother     SOCIAL HISTORY:  Social History   Socioeconomic History  . Marital status: Significant Other    Spouse name: Not on file  . Number of children: 0  . Years of education: college student  . Highest education level: Not on file  Occupational  History  . Not on file  Tobacco Use  . Smoking status: Never Smoker  . Smokeless tobacco: Never Used  Vaping Use  . Vaping Use: Never used  Substance and Sexual Activity  . Alcohol use: No  . Drug use: No  . Sexual activity: Not Currently  Other Topics Concern  . Not on file  Social History Narrative   Lives at home alone   Right handed   Does not drink caffeine, makes her nauseated   Social Determinants of Health   Financial Resource Strain:   . Difficulty of Paying Living Expenses: Not on file  Food Insecurity:   . Worried About Programme researcher, broadcasting/film/video in the Last Year: Not on file  . Ran Out of Food in the Last Year: Not on file  Transportation Needs:   . Lack of Transportation (Medical): Not on file  . Lack of Transportation (Non-Medical): Not on file  Physical Activity:   . Days of Exercise per Week: Not on file  . Minutes of Exercise per Session: Not on file  Stress:   . Feeling of Stress : Not on file  Social Connections:   . Frequency of Communication with Friends and Family: Not on file  . Frequency of Social Gatherings with Friends and  Family: Not on file  . Attends Religious Services: Not on file  . Active Member of Clubs or Organizations: Not on file  . Attends Banker Meetings: Not on file  . Marital Status: Not on file  Intimate Partner Violence:   . Fear of Current or Ex-Partner: Not on file  . Emotionally Abused: Not on file  . Physically Abused: Not on file  . Sexually Abused: Not on file     PHYSICAL EXAM  Vitals:   12/16/19 1548  BP: 107/70  Pulse: 86  Weight: 130 lb (59 kg)  Height: 5\' 1"  (1.549 m)    Body mass index is 24.56 kg/m.  No exam data present  General: The patient is well-developed and well-nourished and in no acute distress  HEENT:  Head is [address]/AT.  Sclera are anicteric.  Funduscopic exam shows normal optic discs and retinal vessels.  Skin: Extremities are without rash or  edema.   Neurologic Exam  Mental status: The patient is alert and oriented x 3 at the time of the examination. The patient has apparent normal recent and remote memory, with an apparently normal attention span and concentration ability.   Speech is normal.  Cranial nerves: Extraocular movements are full. Pupils show 1+ left APD.   She has reduced color vision OS.  Facial strength was normal and symmetric. No obvious hearing deficits are noted.  Motor:  Muscle bulk is normal.   Tone is normal. Strength is  5 / 5 in all 4 extremities.   Sensory: Sensory testing is intact to pinprick, soft touch and vibration sensation in all 4 extremities.  Coordination: Cerebellar testing reveals good finger-nose-finger and heel-to-shin bilaterally.  Gait and station: Station is normal.   Gait is normal. Tandem gait is normal. Romberg is negative.   Reflexes: Deep tendon reflexes are symmetric and normal bilaterally (2 in the arms and 3 in the legs).   Plantar responses are flexor.    DIAGNOSTIC DATA (LABS, IMAGING, TESTING) - I reviewed patient records, labs, notes, testing and imaging myself where  available.  Lab Results  Component Value Date   WBC 10.2 10/22/2019   HGB 13.6 10/22/2019   HCT 42.2 10/22/2019  MCV 91.3 10/22/2019   PLT 356 10/22/2019      Component Value Date/Time   NA 136 10/22/2019 1958   NA 138 10/14/2019 1331   K 3.4 (L) 10/22/2019 1958   CL 102 10/22/2019 1958   CO2 23 10/22/2019 1958   GLUCOSE 137 (H) 10/22/2019 1958   BUN 8 10/22/2019 1958   BUN 6 10/14/2019 1331   CREATININE 0.78 10/22/2019 1958   CALCIUM 9.5 10/22/2019 1958   PROT 7.3 10/14/2019 1331   ALBUMIN 4.7 10/14/2019 1331   ALBUMIN 4.2 07/01/2019 0919   AST 20 10/14/2019 1331   ALT 14 10/14/2019 1331   ALKPHOS 97 10/14/2019 1331   BILITOT 0.4 10/14/2019 1331   GFRNONAA >60 10/22/2019 1958   GFRAA >60 10/22/2019 1958   No results found for: CHOL, HDL, LDLCALC, LDLDIRECT, TRIG, CHOLHDL No results found for: NTZG0F Lab Results  Component Value Date   VITAMINB12 553 06/17/2019   Lab Results  Component Value Date   TSH 0.496 06/17/2019       ASSESSMENT AND PLAN  Optic neuritis - Plan: MR BRAIN W WO CONTRAST  White matter abnormality on MRI of brain - Plan: MR BRAIN W WO CONTRAST  Chronic migraine without aura without status migrainosus, not intractable  1.   MRi of the brain c/s contrast to determine if additional white matter lesion have occurred.  If so, MS is more likely and I would recommend considering a DMT..  If no new lesion, will still need another MRI in about a year.   2.   She will continue Nurtec.   Consider one of the self injectable anti-CGRP agents if frequency increases.    3.   rtc 1 year or sooner based on MRI or if new or worsening symptoms.  Gareld Obrecht A. Epimenio Foot, MD, [facility] 12/16/2019, 6:15 PM Certified in Neurology, Clinical Neurophysiology, Sleep Medicine and Neuroimaging  [facility] [address] [place], [place] [address] [phone]

## 2019-12-17 ENCOUNTER — Telehealth: Payer: Self-pay | Admitting: Neurology

## 2019-12-17 NOTE — Telephone Encounter (Signed)
[facility] order sent to GI. No auth until 12/29/19 GI will reach out to the patient to schedule.

## 2019-12-24 ENCOUNTER — Ambulatory Visit
Admission: RE | Admit: 2019-12-24 | Discharge: 2019-12-24 | Disposition: A | Discharge status: Home / Self Care | Payer: Medicaid Other | Source: Ambulatory Visit | Attending: Neurology | Admitting: Neurology

## 2019-12-24 ENCOUNTER — Other Ambulatory Visit: Payer: Self-pay

## 2019-12-24 DIAGNOSIS — R9082 White matter disease, unspecified: Secondary | ICD-10-CM | POA: Diagnosis not present

## 2019-12-24 DIAGNOSIS — H469 Unspecified optic neuritis: Secondary | ICD-10-CM

## 2019-12-24 MED ORDER — GADOBENATE DIMEGLUMINE 529 MG/ML IV SOLN
15.0000 mL | Freq: Once | INTRAVENOUS | Status: AC | PRN
  Administered 2019-12-24: 11 mL via INTRAVENOUS

## 2019-12-28 ENCOUNTER — Ambulatory Visit (INDEPENDENT_AMBULATORY_CARE_PROVIDER_SITE_OTHER): Payer: Medicaid Other | Admitting: Registered Nurse

## 2019-12-28 ENCOUNTER — Encounter: Payer: Self-pay | Admitting: Registered Nurse

## 2019-12-28 ENCOUNTER — Telehealth: Payer: Self-pay | Admitting: Neurology

## 2019-12-28 ENCOUNTER — Other Ambulatory Visit: Payer: Self-pay

## 2019-12-28 VITALS — BP 114/81 | HR 98 | Temp 98.2°F | Resp 18 | Ht 61.0 in | Wt 124.2 lb

## 2019-12-28 DIAGNOSIS — R61 Generalized hyperhidrosis: Secondary | ICD-10-CM | POA: Diagnosis not present

## 2019-12-28 DIAGNOSIS — R232 Flushing: Secondary | ICD-10-CM

## 2019-12-28 NOTE — Patient Instructions (Signed)
° ° ° °  If you have lab work done today you will be contacted with your lab results within the next 2 weeks.  If you have not heard from us then please contact us. The fastest way to get your results is to register for My Chart. ° ° °IF you received an x-ray today, you will receive an invoice from [facility]. Please contact [facility] at [phone] with questions or concerns regarding your invoice.  ° °IF you received labwork today, you will receive an invoice from [facility]. Please contact [facility] at [phone] with questions or concerns regarding your invoice.  ° °Our billing staff will not be able to assist you with questions regarding bills from these companies. ° °You will be contacted with the lab results as soon as they are available. The fastest way to get your results is to activate your My Chart account. Instructions are located on the last page of this paperwork. If you have not heard from us regarding the results in 2 weeks, please contact this office. °  ° ° ° °

## 2019-12-28 NOTE — Progress Notes (Signed)
Acute Office Visit  Subjective:    Patient ID: Tracy Mcconnell, female    DOB: 11/11/1995, 24 y.o.   MRN: [id number]  Chief Complaint  Patient presents with  . Hot Flashes    PAtient states she has been getting hot flashes at night for a few months and now it starting to happen throughout the day. Per patient she doesn't have a fever when the hot flashes occur,  . Medication Refill    on pended medicatios    HPI Patient is in today for hot flashes Ongoing for a few months Worse at night but happen during the day as well Thought it was related to her cholecystitis, but has been continuing following her cholecystectomy.  No lightheadedness/dizziness. No shob/doe. No chest pain or palpitations Denies headaches, sick contacts, GI symptoms, GU symptoms  Does sweat profusely with these hot flashes   Past Medical History:  Diagnosis Date  . Anemia   . Anxiety   . Depression   . GERD (gastroesophageal reflux disease)   . Hypovitaminosis D 04/07/2019  . Left eye complaint 06/08/2019  . Migraines     Past Surgical History:  Procedure Laterality Date  . CHOLECYSTECTOMY N/A 10/19/2019   Procedure: LAPAROSCOPIC CHOLECYSTECTOMY;  Surgeon: Berna Bue, MD;  Location: [facility];  Service: General;  Laterality: N/A;    Family History  Problem Relation Age of Onset  . Migraines Mother   . Thyroid disease Mother   . Cancer Maternal Grandmother   . Diabetes Paternal Grandfather   . Hypertension Maternal Grandfather   . Diabetes Paternal Grandmother   . Stroke Paternal Grandmother     Social History   Socioeconomic History  . Marital status: Significant Other    Spouse name: Not on file  . Number of children: 0  . Years of education: college student  . Highest education level: Not on file  Occupational History  . Not on file  Tobacco Use  . Smoking status: Never Smoker  . Smokeless tobacco: Never Used  Vaping Use  . Vaping Use: Never used  Substance and Sexual  Activity  . Alcohol use: No  . Drug use: No  . Sexual activity: Not Currently  Other Topics Concern  . Not on file  Social History Narrative   Lives at home alone   Right handed   Does not drink caffeine, makes her nauseated   Social Determinants of Health   Financial Resource Strain:   . Difficulty of Paying Living Expenses: Not on file  Food Insecurity:   . Worried About Programme researcher, broadcasting/film/video in the Last Year: Not on file  . Ran Out of Food in the Last Year: Not on file  Transportation Needs:   . Lack of Transportation (Medical): Not on file  . Lack of Transportation (Non-Medical): Not on file  Physical Activity:   . Days of Exercise per Week: Not on file  . Minutes of Exercise per Session: Not on file  Stress:   . Feeling of Stress : Not on file  Social Connections:   . Frequency of Communication with Friends and Family: Not on file  . Frequency of Social Gatherings with Friends and Family: Not on file  . Attends Religious Services: Not on file  . Active Member of Clubs or Organizations: Not on file  . Attends Banker Meetings: Not on file  . Marital Status: Not on file  Intimate Partner Violence:   . Fear of Current or Ex-Partner: Not  on file  . Emotionally Abused: Not on file  . Physically Abused: Not on file  . Sexually Abused: Not on file    Outpatient Medications Prior to Visit  Medication Sig Dispense Refill  . busPIRone (BUSPAR) 30 MG tablet Take 30 mg by mouth 2 (two) times daily.    . cyclobenzaprine (FLEXERIL) 10 MG tablet Take 1 tablet (10 mg total) by mouth 3 (three) times daily as needed for muscle spasms. 30 tablet 0  . ergocalciferol (VITAMIN D2) 1.25 MG (50000 UT) capsule Take 50,000 Units by mouth every Friday.     [name] FLUoxetine (PROZAC) 40 MG capsule Take 1 capsule (40 mg total) by mouth in the morning. 90 capsule 3  . LAVENDER OIL PO Take 1 capsule by mouth daily.    [name] LORazepam (ATIVAN) 1 MG tablet Take 1 mg by mouth every 6 (six) hours  as needed for anxiety.    . magnesium oxide (MAG-OX) 400 MG tablet Take 400 mg by mouth daily.    . naproxen sodium (ALEVE) 220 MG tablet Take 440 mg by mouth 2 (two) times daily as needed (pain).    [name] omeprazole (PRILOSEC) 20 MG capsule Take 1 capsule (20 mg total) by mouth daily. 30 capsule 3  . OnabotulinumtoxinA (BOTOX IJ) Inject 1 Dose as directed every 3 (three) months.    [name] oxyCODONE (ROXICODONE) 5 MG immediate release tablet Take 1 tablet (5 mg total) by mouth every 4 (four) hours as needed for severe pain. 12 tablet 0  . QUEtiapine (SEROQUEL) 50 MG tablet 1/2 to 1 tab qhs for sleep (Patient taking differently: Take 50 mg by mouth at bedtime. ) 30 tablet 0  . Rimegepant Sulfate (NURTEC) 75 MG TBDP Take 75 mg by mouth daily as needed (take for abortive therapy of migraine, no more than 1 tablet in 24 hours or 10 per month). 8 tablet 11  . rizatriptan (MAXALT-MLT) 10 MG disintegrating tablet Take 1 tablet (10 mg total) by mouth as needed for migraine (and may repeat once in 2 hours, if no relief (DISSOLVE IN THE MOUTH)). (Patient taking differently: Take 10 mg by mouth as needed for migraine. May repeat in once in 2 hours.) 11 tablet 5  . buPROPion (WELLBUTRIN XL) 300 MG 24 hr tablet Take 1 tablet (300 mg total) by mouth in the morning. 30 tablet 1  . gabapentin (NEURONTIN) 600 MG tablet Take 1 tablet (600 mg total) by mouth in the morning, at noon, in the evening, and at bedtime. 360 tablet 0  . traMADol (ULTRAM) 50 MG tablet Take 1 tablet (50 mg total) by mouth every 6 (six) hours as needed (pain not relieved by tylenol or ibuprofen). (Patient not taking: Reported on 12/28/2019) 10 tablet 0   No facility-administered medications prior to visit.    Allergies  Allergen Reactions  . Sumatriptan Shortness Of Breath and Other (See Comments)    Entire body "felt heavy"  . Other Itching, Swelling and Other (See Comments)    Pesticides (family history)- Exposure face and mouth itch and the  tongue/lips swell    Review of Systems Per hpi      Objective:    Physical Exam Vitals and nursing note reviewed.  Constitutional:      General: She is not in acute distress.    Appearance: Normal appearance. She is normal weight. She is not ill-appearing, toxic-appearing or diaphoretic.  Cardiovascular:     Rate and Rhythm: Normal rate and regular rhythm.  Pulmonary:  Effort: Pulmonary effort is normal. No respiratory distress.  Skin:    General: Skin is warm and dry.     Capillary Refill: Capillary refill takes less than 2 seconds.     Coloration: Skin is not jaundiced or pale.     Findings: No erythema.  Neurological:     General: No focal deficit present.     Mental Status: She is alert and oriented to person, place, and time. Mental status is at baseline.  Psychiatric:        Mood and Affect: Mood normal.        Behavior: Behavior normal.        Thought Content: Thought content normal.        Judgment: Judgment normal.     BP 114/81   Pulse 98   Temp 98.2 F (36.8 C) (Temporal)   Resp 18   Ht 5\' 1"  (1.549 m)   Wt 124 lb 3.2 oz (56.3 kg)   SpO2 96%   BMI 23.47 kg/m  Wt Readings from Last 3 Encounters:  12/28/19 124 lb 3.2 oz (56.3 kg)  12/16/19 130 lb (59 kg)  11/11/19 125 lb (56.7 kg)    There are no preventive care reminders to display for this patient.  There are no preventive care reminders to display for this patient.   Lab Results  Component Value Date   TSH 0.496 06/17/2019   Lab Results  Component Value Date   WBC 10.2 10/22/2019   HGB 13.6 10/22/2019   HCT 42.2 10/22/2019   MCV 91.3 10/22/2019   PLT 356 10/22/2019   Lab Results  Component Value Date   NA 136 10/22/2019   K 3.4 (L) 10/22/2019   CO2 23 10/22/2019   GLUCOSE 137 (H) 10/22/2019   BUN 8 10/22/2019   CREATININE 0.78 10/22/2019   BILITOT 0.4 10/14/2019   ALKPHOS 97 10/14/2019   AST 20 10/14/2019   ALT 14 10/14/2019   PROT 7.3 10/14/2019   ALBUMIN 4.7 10/14/2019    CALCIUM 9.5 10/22/2019   ANIONGAP 11 10/22/2019   No results found for: CHOL No results found for: HDL No results found for: LDLCALC No results found for: TRIG No results found for: CHOLHDL No results found for: 10/24/2019     Assessment & Plan:   Problem List Items Addressed This Visit    None    Visit Diagnoses    Hot flashes not due to menopause    -  Primary   Relevant Orders   Thyroid Panel With TSH   CBC With Differential   Comprehensive metabolic panel   Hemoglobin A1c   Lipase   Amylase   HIV antibody (with reflex)   QuantiFERON-TB Gold Plus   Urinalysis   Excessive sweating       Relevant Orders   Thyroid Panel With TSH   CBC With Differential   Comprehensive metabolic panel   Hemoglobin A1c   Lipase   Amylase   HIV antibody (with reflex)   QuantiFERON-TB Gold Plus   Urinalysis       No orders of the defined types were placed in this encounter.  PLAN  Nonspecific symptom with no definite etiology - will cast a wide net with labs.  Following up on CBC, CMP, and Urinalysis - last studies had abnormalities  Will follow up on results as warranted and plan from there  Patient encouraged to call clinic with any questions, comments, or concerns.  [id number], NP

## 2019-12-28 NOTE — Telephone Encounter (Signed)
  °  IMPRESSION: Abnormal MRI scan of the brain showing subtle right frontal periventricular and right parietal white matter nonspecific hyperintensities which appear unchanged compared with previous MRI from 06/10/2019.  No new or enhancing lesions are noted.  The previously seen optic nerve enhancement is no longer appreciated.  Please call patient, MRI of the brain showed improvement, previous optic enhancement is no longer appreciated, there was no new development of intracranial abnormalities.

## 2019-12-28 NOTE — Telephone Encounter (Signed)
Called patient and discussed MRI results with patient.    All questions answered, patient denied any further needs and expressed appreciation.

## 2019-12-29 ENCOUNTER — Encounter: Payer: Self-pay | Admitting: Registered Nurse

## 2019-12-29 ENCOUNTER — Ambulatory Visit: Payer: Medicaid Other | Admitting: Neurology

## 2019-12-29 LAB — URINALYSIS
Bilirubin, UA: NEGATIVE
Glucose, UA: NEGATIVE
Leukocytes,UA: NEGATIVE
Nitrite, UA: NEGATIVE
RBC, UA: NEGATIVE
Specific Gravity, UA: 1.025 (ref 1.005–1.030)
Urobilinogen, Ur: 0.2 mg/dL (ref 0.2–1.0)
pH, UA: 6 (ref 5.0–7.5)

## 2019-12-29 NOTE — Progress Notes (Signed)
Established Patient Office Visit  Subjective:  Patient ID: Tracy Mcconnell, female    DOB: 01/03/1996  Age: 24 y.o. MRN: [id number]  CC:  Chief Complaint  Patient presents with   Gastroesophageal Reflux    follow up / stattes doing better w/ tx     HPI Tracy Mcconnell presents for GERD follow up  Doing better with treatment consistency and dietary modification No melena, brbpr, hematochezia, coffee ground emesis Denies sxs of anemia  No other concerns  Past Medical History:  Diagnosis Date   Anemia    Anxiety    Depression    GERD (gastroesophageal reflux disease)    Hypovitaminosis D 04/07/2019   Left eye complaint 06/08/2019   Migraines     Past Surgical History:  Procedure Laterality Date   CHOLECYSTECTOMY N/A 10/19/2019   Procedure: LAPAROSCOPIC CHOLECYSTECTOMY;  Surgeon: Berna Bue, MD;  Location: [facility];  Service: General;  Laterality: N/A;    Family History  Problem Relation Age of Onset   Migraines Mother    Thyroid disease Mother    Cancer Maternal Grandmother    Diabetes Paternal Grandfather    Hypertension Maternal Grandfather    Diabetes Paternal Grandmother    Stroke Paternal Grandmother     Social History   Socioeconomic History   Marital status: Significant Other    Spouse name: Not on file   Number of children: 0   Years of education: college student   Highest education level: Not on file  Occupational History   Not on file  Tobacco Use   Smoking status: Never Smoker   Smokeless tobacco: Never Used  Vaping Use   Vaping Use: Never used  Substance and Sexual Activity   Alcohol use: No   Drug use: No   Sexual activity: Not Currently  Other Topics Concern   Not on file  Social History Narrative   Lives at home alone   Right handed   Does not drink caffeine, makes her nauseated   Social Determinants of Health   Financial Resource Strain:    Difficulty of Paying Living Expenses: Not  on file  Food Insecurity:    Worried About Programme researcher, broadcasting/film/video in the Last Year: Not on file   [employer] of Food in the Last Year: Not on file  Transportation Needs:    Lack of Transportation (Medical): Not on file   Lack of Transportation (Non-Medical): Not on file  Physical Activity:    Days of Exercise per Week: Not on file   Minutes of Exercise per Session: Not on file  Stress:    Feeling of Stress : Not on file  Social Connections:    Frequency of Communication with Friends and Family: Not on file   Frequency of Social Gatherings with Friends and Family: Not on file   Attends Religious Services: Not on file   Active Member of Clubs or Organizations: Not on file   Attends Banker Meetings: Not on file   Marital Status: Not on file  Intimate Partner Violence:    Fear of Current or Ex-Partner: Not on file   Emotionally Abused: Not on file   Physically Abused: Not on file   Sexually Abused: Not on file    Outpatient Medications Prior to Visit  Medication Sig Dispense Refill   ergocalciferol (VITAMIN D2) 1.25 MG (50000 UT) capsule Take 50,000 Units by mouth every Friday.      gabapentin (NEURONTIN) 600 MG tablet Take 1 tablet (600  mg total) by mouth in the morning, at noon, in the evening, and at bedtime. 360 tablet 0   LAVENDER OIL PO Take 1 capsule by mouth daily.     magnesium oxide (MAG-OX) 400 MG tablet Take 400 mg by mouth daily.     omeprazole (PRILOSEC) 20 MG capsule Take 1 capsule (20 mg total) by mouth daily. 30 capsule 3   QUEtiapine (SEROQUEL) 50 MG tablet 1/2 to 1 tab qhs for sleep (Patient taking differently: Take 50 mg by mouth at bedtime. ) 30 tablet 0   rizatriptan (MAXALT-MLT) 10 MG disintegrating tablet Take 1 tablet (10 mg total) by mouth as needed for migraine (and may repeat once in 2 hours, if no relief (DISSOLVE IN THE MOUTH)). (Patient taking differently: Take 10 mg by mouth as needed for migraine. May repeat in once in 2  hours.) 11 tablet 5   baclofen (LIORESAL) 20 MG tablet Take 20 mg by mouth 3 (three) times daily as needed for muscle spasms.      fluticasone (FLONASE) 50 MCG/ACT nasal spray Place 2 sprays into both nostrils daily. (Patient not taking: Reported on 10/15/2019) 16 g 0   hydrOXYzine (ATARAX/VISTARIL) 50 MG tablet Take 1 tablet (50 mg total) by mouth 3 (three) times daily as needed for anxiety. (Patient not taking: Reported on 10/15/2019) 30 tablet    LORazepam (ATIVAN) 0.5 MG tablet Take 0.5-1 tablets (0.25-0.5 mg total) by mouth every 6 (six) hours as needed for up to 30 doses for anxiety. (Patient not taking: Reported on 10/15/2019) 30 tablet 1   naproxen (NAPROSYN) 500 MG tablet Take 1 tablet (500 mg total) by mouth 2 (two) times daily with a meal. (Patient not taking: Reported on 10/15/2019) 60 tablet 0   oxyCODONE (ROXICODONE) 5 MG immediate release tablet Take 1 tablet (5 mg total) by mouth every 4 (four) hours as needed for severe pain. 12 tablet 0   buPROPion (WELLBUTRIN XL) 300 MG 24 hr tablet Take 1 tablet (300 mg total) by mouth in the morning. 30 tablet 1   FLUoxetine (PROZAC) 40 MG capsule Take 1 capsule (40 mg total) by mouth in the morning. 30 capsule 1   No facility-administered medications prior to visit.    Allergies  Allergen Reactions   Sumatriptan Shortness Of Breath and Other (See Comments)    Entire body "felt heavy"   Other Itching, Swelling and Other (See Comments)    Pesticides (family history)- Exposure face and mouth itch and the tongue/lips swell    ROS Review of Systems  Constitutional: Negative.   HENT: Negative.   Eyes: Negative.   Respiratory: Negative.   Cardiovascular: Negative.   Gastrointestinal: Negative.   Genitourinary: Negative.   Musculoskeletal: Negative.   Skin: Negative.   Neurological: Negative.   Psychiatric/Behavioral: Negative.       Objective:    Physical Exam Vitals and nursing note reviewed.  Constitutional:       General: She is not in acute distress.    Appearance: Normal appearance. She is normal weight. She is not ill-appearing, toxic-appearing or diaphoretic.  Cardiovascular:     Rate and Rhythm: Normal rate and regular rhythm.     Heart sounds: Normal heart sounds. No murmur heard.  No friction rub. No gallop.   Pulmonary:     Effort: Pulmonary effort is normal. No respiratory distress.     Breath sounds: Normal breath sounds. No stridor. No wheezing, rhonchi or rales.  Chest:     Chest wall: No  tenderness.  Abdominal:     General: Bowel sounds are normal. There is no distension.     Tenderness: There is no abdominal tenderness. There is no guarding.  Skin:    General: Skin is warm and dry.  Neurological:     General: No focal deficit present.     Mental Status: She is alert and oriented to person, place, and time. Mental status is at baseline.  Psychiatric:        Mood and Affect: Mood normal.        Behavior: Behavior normal.        Thought Content: Thought content normal.        Judgment: Judgment normal.     BP 105/75    Pulse 70    Temp 98 F (36.7 C)    Ht 5' (1.524 m)    Wt 122 lb (55.3 kg)    LMP 10/04/2019    BMI 23.83 kg/m  Wt Readings from Last 3 Encounters:  12/28/19 124 lb 3.2 oz (56.3 kg)  12/16/19 130 lb (59 kg)  11/11/19 125 lb (56.7 kg)     There are no preventive care reminders to display for this patient.  There are no preventive care reminders to display for this patient.  Lab Results  Component Value Date   TSH 0.494 12/28/2019   Lab Results  Component Value Date   WBC 8.6 12/28/2019   HGB 14.6 12/28/2019   HCT 42.8 12/28/2019   MCV 87 12/28/2019   PLT 356 10/22/2019   Lab Results  Component Value Date   NA 140 12/28/2019   K 3.9 12/28/2019   CO2 23 12/28/2019   GLUCOSE 95 12/28/2019   BUN 9 12/28/2019   CREATININE 0.71 12/28/2019   BILITOT 0.3 12/28/2019   ALKPHOS 100 12/28/2019   AST 16 12/28/2019   ALT 11 12/28/2019   PROT 7.5  12/28/2019   ALBUMIN 4.9 12/28/2019   CALCIUM 10.4 (H) 12/28/2019   ANIONGAP 11 10/22/2019   No results found for: CHOL No results found for: HDL No results found for: LDLCALC No results found for: TRIG No results found for: CHOLHDL Lab Results  Component Value Date   HGBA1C 5.2 12/28/2019      Assessment & Plan:   Problem List Items Addressed This Visit    None    Visit Diagnoses    Screening for endocrine, metabolic and immunity disorder    -  Primary   Relevant Orders   CBC (Completed)   Comprehensive metabolic panel (Completed)   Gastroesophageal reflux disease, unspecified whether esophagitis present       Relevant Orders   CBC (Completed)   Comprehensive metabolic panel (Completed)      No orders of the defined types were placed in this encounter.   Follow-up: No follow-ups on file.   PLAN  Continue with treatment and lifestyle changes  Will draw CBC and CMP to trend and follow up as warranted  Patient encouraged to call clinic with any questions, comments, or concerns.  Janeece Agee, NP

## 2019-12-30 ENCOUNTER — Encounter: Payer: Self-pay | Admitting: Registered Nurse

## 2019-12-30 LAB — COMPREHENSIVE METABOLIC PANEL
ALT: 11 IU/L (ref 0–32)
AST: 16 IU/L (ref 0–40)
Albumin/Globulin Ratio: 1.9 (ref 1.2–2.2)
Albumin: 4.9 g/dL (ref 3.9–5.0)
Alkaline Phosphatase: 100 IU/L (ref 44–121)
BUN/Creatinine Ratio: 13 (ref 9–23)
BUN: 9 mg/dL (ref 6–20)
Bilirubin Total: 0.3 mg/dL (ref 0.0–1.2)
CO2: 23 mmol/L (ref 20–29)
Calcium: 10.4 mg/dL — ABNORMAL HIGH (ref 8.7–10.2)
Chloride: 104 mmol/L (ref 96–106)
Creatinine, Ser: 0.71 mg/dL (ref 0.57–1.00)
GFR calc Af Amer: 138 mL/min/{1.73_m2} (ref >59)
GFR calc non Af Amer: 120 mL/min/{1.73_m2} (ref >59)
Globulin, Total: 2.6 g/dL (ref 1.5–4.5)
Glucose: 95 mg/dL (ref 65–99)
Potassium: 3.9 mmol/L (ref 3.5–5.2)
Sodium: 140 mmol/L (ref 134–144)
Total Protein: 7.5 g/dL (ref 6.0–8.5)

## 2019-12-30 LAB — CBC WITH DIFFERENTIAL
Basophils Absolute: 0.1 10*3/uL (ref 0.0–0.2)
Basos: 1 %
EOS (ABSOLUTE): 0.1 10*3/uL (ref 0.0–0.4)
Eos: 1 %
Hematocrit: 42.8 % (ref 34.0–46.6)
Hemoglobin: 14.6 g/dL (ref 11.1–15.9)
Immature Grans (Abs): 0 10*3/uL (ref 0.0–0.1)
Immature Granulocytes: 0 %
Lymphocytes Absolute: 2.4 10*3/uL (ref 0.7–3.1)
Lymphs: 28 %
MCH: 29.6 pg (ref 26.6–33.0)
MCHC: 34.1 g/dL (ref 31.5–35.7)
MCV: 87 fL (ref 79–97)
Monocytes Absolute: 0.5 10*3/uL (ref 0.1–0.9)
Monocytes: 5 %
Neutrophils Absolute: 5.6 10*3/uL (ref 1.4–7.0)
Neutrophils: 65 %
RBC: 4.93 x10E6/uL (ref 3.77–5.28)
RDW: 12.5 % (ref 11.7–15.4)
WBC: 8.6 10*3/uL (ref 3.4–10.8)

## 2019-12-30 LAB — QUANTIFERON-TB GOLD PLUS
QuantiFERON Mitogen Value: >10 IU/mL
QuantiFERON Nil Value: 0.03 IU/mL
QuantiFERON TB1 Ag Value: 0.02 IU/mL
QuantiFERON TB2 Ag Value: 0.05 IU/mL
QuantiFERON-TB Gold Plus: NEGATIVE

## 2019-12-30 LAB — HIV ANTIBODY (ROUTINE TESTING W REFLEX): HIV Screen 4th Generation wRfx: NONREACTIVE

## 2019-12-30 LAB — LIPASE: Lipase: 28 U/L (ref 14–72)

## 2019-12-30 LAB — THYROID PANEL WITH TSH
Free Thyroxine Index: 1.8 (ref 1.2–4.9)
T3 Uptake Ratio: 22 % — ABNORMAL LOW (ref 24–39)
T4, Total: 8.2 ug/dL (ref 4.5–12.0)
TSH: 0.494 u[IU]/mL (ref 0.450–4.500)

## 2019-12-30 LAB — HEMOGLOBIN A1C
Est. average glucose Bld gHb Est-mCnc: 103 mg/dL
Hgb A1c MFr Bld: 5.2 % (ref 4.8–5.6)

## 2019-12-30 LAB — AMYLASE: Amylase: 65 U/L (ref 31–110)

## 2019-12-30 NOTE — Telephone Encounter (Signed)
Please advise on med refills.   Pt was last seen on 12/28/19.

## 2020-01-01 DIAGNOSIS — Z419 Encounter for procedure for purposes other than remedying health state, unspecified: Secondary | ICD-10-CM | POA: Diagnosis not present

## 2020-01-03 ENCOUNTER — Encounter: Payer: Self-pay | Admitting: Registered Nurse

## 2020-01-04 MED ORDER — BUPROPION HCL ER (XL) 300 MG PO TB24: 300.0000 mg | ORAL_TABLET | Freq: Every morning | ORAL | 1 refills | Status: DC

## 2020-01-05 MED ORDER — LORAZEPAM 1 MG PO TABS: 1.0000 mg | ORAL_TABLET | Freq: Four times a day (QID) | ORAL | 1 refills | Status: DC | PRN

## 2020-01-05 MED ORDER — QUETIAPINE FUMARATE 50 MG PO TABS: ORAL_TABLET | ORAL | 0 refills | Status: DC

## 2020-01-06 ENCOUNTER — Telehealth: Payer: Self-pay | Admitting: Family Medicine

## 2020-01-06 NOTE — Telephone Encounter (Signed)
Patient has a Botox appointment on 10/11. I called [facility] to see if I could schedule delivery. I was informed that they are unable to fill patient's Botox because she has new insurance that took effect on 9/1. She now has [facility] Medicaid. I filled out [facility] PA form and gave to NP to sign.

## 2020-01-07 MED ORDER — BOTOX 200 UNITS IJ SOLR: INTRAMUSCULAR | 3 refills | Status: DC

## 2020-01-07 NOTE — Telephone Encounter (Signed)
I faxed PA form to [facility].

## 2020-01-07 NOTE — Telephone Encounter (Signed)
Received approval from [facility] via fax. PA #[id number] (01/07/20- 07/07/20). I called [facility] to see which SP patient needs to use. I spoke with [place] who states that patient should use [facility]. Reference #[id number].   Andrey Campanile,  Can you please send patient's Botox prescription to [facility]? [address] [place], [place] [address] [phone]

## 2020-01-07 NOTE — Telephone Encounter (Signed)
Placed order

## 2020-01-07 NOTE — Addendum Note (Signed)
Addended by: Hermenia Fiscal S on: 01/07/2020 01:50 PM   Modules accepted: Orders

## 2020-01-11 ENCOUNTER — Ambulatory Visit: Payer: Medicaid Other | Admitting: Adult Health

## 2020-01-11 ENCOUNTER — Ambulatory Visit: Payer: Medicaid Other | Admitting: Family Medicine

## 2020-01-11 ENCOUNTER — Other Ambulatory Visit: Payer: Self-pay

## 2020-01-11 ENCOUNTER — Telehealth: Payer: Self-pay | Admitting: *Deleted

## 2020-01-11 DIAGNOSIS — G43709 Chronic migraine without aura, not intractable, without status migrainosus: Secondary | ICD-10-CM

## 2020-01-11 NOTE — Progress Notes (Signed)
BOTOX PROCEDURE NOTE FOR MIGRAINE HEADACHE    Contraindications and precautions discussed with patient(above). Aseptic procedure was observed and patient tolerated procedure. Procedure performed by [name], NP  The condition has existed for more than 6 months, and pt does not have a diagnosis of ALS, Myasthenia Gravis or Lambert-Eaton Syndrome.  Risks and benefits of injections discussed and pt agrees to proceed with the procedure.  Written consent obtained  These injections are medically necessary. She receives good benefits from these injections. Headache frequency has decreased to 15 a month.  These injections do not cause sedations or hallucinations which the oral therapies may cause.  Indication/Diagnosis: chronic migraine BOTOX(J0585) injection was performed according to protocol by [employer]. 200 units of BOTOX was dissolved into 4 cc NS.   NDC: [address]-93  Type of toxin: Botox  Botox- 100 units x 2 vials Lot: [id number] Expiration: 05/2019 NDC: 0258-5277-82  Bacteriostatic 0.9% Sodium Chloride- 17mL total Lot: [id number] Expiration: 05/2021 NDC: 4431-5400-86  Dx: P61.950 B/B  Patient was given consent today. Consent was signed.    Description of procedure:  The patient was placed in a sitting position. The standard protocol was used for Botox as follows, with 5 units of Botox injected at each site:   -Procerus muscle, midline injection  -Corrugator muscle, bilateral injection  -Frontalis muscle, bilateral injection, with 2 sites each side, medial injection was performed in the upper one third of the frontalis muscle, in the region vertical from the medial inferior edge of the superior orbital rim. The lateral injection was again in the upper one third of the forehead vertically above the lateral limbus of the cornea, 1.5 cm lateral to the medial injection site.  -Temporalis muscle injection, 4 sites, bilaterally. The first injection was 3 cm above the  tragus of the ear, second injection site was 1.5 cm to 3 cm up from the first injection site in line with the tragus of the ear. The third injection site was 1.5-3 cm forward between the first 2 injection sites. The fourth injection site was 1.5 cm posterior to the second injection site.  -Occipitalis muscle injection, 3 sites, bilaterally. The first injection was done one half way between the occipital protuberance and the tip of the mastoid process behind the ear. The second injection site was done lateral and superior to the first, 1 fingerbreadth from the first injection. The third injection site was 1 fingerbreadth superiorly and medially from the first injection site.  -Cervical paraspinal muscle injection, 2 sites, bilateral knee first injection site was 1 cm from the midline of the cervical spine, 3 cm inferior to the lower border of the occipital protuberance. The second injection site was 1.5 cm superiorly and laterally to the first injection site.  -Trapezius muscle injection was performed at 3 sites, bilaterally. The first injection site was in the upper trapezius muscle halfway between the inflection point of the neck, and the acromion. The second injection site was one half way between the acromion and the first injection site. The third injection was done between the first injection site and the inflection point of the neck.   Will return for repeat injection in 3 months.   A 200 unit sof Botox was used, 155 units were injected, the rest of the Botox was wasted. The patient tolerated the procedure well, there were no complications of the above procedure.  [name], MSN, NP-C 01/11/2020, 5:30 PM [facility] [address] [place], [place]  [address] [phone]  

## 2020-01-11 NOTE — Progress Notes (Signed)
Botox- 100 units x 2 vials Lot: [id number] Expiration: 05/2019 NDC: 9021-1155-20  Bacteriostatic 0.9% Sodium Chloride- 25mL total Lot: [id number] Expiration: 05/2021 NDC: 1224-4975-30  Dx: Y51.102 B/B  Patient was given consent today. Consent was signed.

## 2020-01-11 NOTE — Telephone Encounter (Signed)
Received fax from [facility] about BOTOX 200unit vial.  Referral received if prescription we have already started processing your pts order.  If prescription rerquires a PA we will coordinated the necessary documentation and expediate the submission.  [phone].

## 2020-01-11 NOTE — Telephone Encounter (Signed)
I called pt and LMVM for her that [facility] NP schedule changed needing to if possbile to see her at 0900 for her BOTOX, if this not possible then can see at 1500 with another NP for her BOTOX.  Please call back. Did change appt to MM/NP for BOTOX.

## 2020-01-13 NOTE — Telephone Encounter (Signed)
I called [facility] and spoke with Rinaldo Cloud to check status of the Botox order. She states that they are currently verifying benefits. She has sent an e-mail to the insurance department to look into it.

## 2020-01-14 ENCOUNTER — Telehealth: Payer: Medicaid Other | Admitting: Family

## 2020-01-14 ENCOUNTER — Telehealth: Payer: Medicaid Other | Admitting: Orthopedic Surgery

## 2020-01-14 DIAGNOSIS — J309 Allergic rhinitis, unspecified: Secondary | ICD-10-CM | POA: Diagnosis not present

## 2020-01-14 DIAGNOSIS — J029 Acute pharyngitis, unspecified: Secondary | ICD-10-CM | POA: Diagnosis not present

## 2020-01-14 NOTE — Progress Notes (Signed)
We are sorry that you are not feeling well.  Here is how we plan to help!  Your symptoms indicate a likely viral infection (Pharyngitis).   Pharyngitis is inflammation in the back of the throat which can cause a sore throat, scratchiness and sometimes difficulty swallowing.   Pharyngitis is typically caused by a respiratory virus and will just run its course.  Please keep in mind that your symptoms could last up to 10 days.  For throat pain, we recommend over the counter oral pain relief medications such as acetaminophen or aspirin, or anti-inflammatory medications such as ibuprofen or naproxen sodium.  Topical treatments such as oral throat lozenges or sprays may be used as needed.  Avoid close contact with loved ones, especially the very young and elderly.  Remember to wash your hands thoroughly throughout the day as this is the number one way to prevent the spread of infection and wipe down door knobs and counters with disinfectant.  After careful review of your answers, I would not recommend and antibiotic for your condition.  Antibiotics should not be used to treat conditions that we suspect are caused by viruses like the virus that causes the common cold or flu. However, some people can have Strep with atypical symptoms. You may need formal testing in clinic or office to confirm if your symptoms continue or worsen.  Providers prescribe antibiotics to treat infections caused by bacteria. Antibiotics are very powerful in treating bacterial infections when they are used properly.  To maintain their effectiveness, they should be used only when necessary.  Overuse of antibiotics has resulted in the development of super bugs that are resistant to treatment!    The blood in your ears is very unusual and not typical with any sort of sore throat. Although I don't think you need to go to the emergency room, I think you plan to go to your PCP or and urgent care in the morning.  Home Care:  Only take  medications as instructed by your medical team.  Do not drink alcohol while taking these medications.  A steam or ultrasonic humidifier can help congestion.  You can place a towel over your head and breathe in the steam from hot water coming from a faucet.  Avoid close contacts especially the very young and the elderly.  Cover your mouth when you cough or sneeze.  Always remember to wash your hands.  Get Help Right Away If:  You develop worsening fever or throat pain.  You develop a severe head ache or visual changes.  Your symptoms persist after you have completed your treatment plan.  Make sure you  Understand these instructions.  Will watch your condition.  Will get help right away if you are not doing well or get worse.  Your e-visit answers were reviewed by a board certified advanced clinical practitioner to complete your personal care plan.  Depending on the condition, your plan could have included both over the counter or prescription medications.  If there is a problem please reply  once you have received a response from your provider.  Your safety is important to [place].  If you have drug allergies check your prescription carefully.    You can use MyChart to ask questions about todays visit, request a non-urgent call back, or ask for a work or school excuse for 24 hours related to this e-Visit. If it has been greater than 24 hours you will need to follow up with your provider, or enter a  new e-Visit to address those concerns.  You will get an e-mail in the next two days asking about your experience.  I hope that your e-visit has been valuable and will speed your recovery. Thank you for using e-visits.  Greater than 5 minutes, yet less than 10 minutes of time have been spent researching, coordinating and implementing care for this patient today.

## 2020-01-14 NOTE — Progress Notes (Signed)
E visit for Allergic Rhinitis We are sorry that you are not feeling well.  Here is how we plan to help!  Based on what you have shared with me it looks like you have Allergic Rhinitis.  Rhinitis is when a reaction occurs that causes nasal congestion, runny nose, sneezing, and itching.  Most types of rhinitis are caused by an inflammation and are associated with symptoms in the eyes ears or throat. There are several types of rhinitis.  The most common are acute rhinitis, which is usually caused by a viral illness, allergic or seasonal rhinitis, and nonallergic or year-round rhinitis.  Nasal allergies occur certain times of the year.  Allergic rhinitis is caused when allergens in the air trigger the release of histamine in the body.  Histamine causes itching, swelling, and fluid to build up in the fragile linings of the nasal passages, sinuses and eyelids.  An itchy nose and clear discharge are common.  I recommend the following over the counter treatments: Xyzal 5 mg take 1 tablet daily  I also would recommend a nasal spray: Flonase 2 sprays into each nostril once daily   HOME CARE:   You can use an over-the-counter saline nasal spray as needed  Avoid areas where there is heavy dust, mites, or molds  Stay indoors on windy days during the pollen season  Keep windows closed in home, at least in bedroom; use air conditioner.  Use high-efficiency house air filter  Keep windows closed in car, turn AC on re-circulate  Avoid playing out with dog during pollen season  GET HELP RIGHT AWAY IF:   If your symptoms do not improve within 10 days  You become short of breath  You develop yellow or green discharge from your nose for over 3 days  You have coughing fits  MAKE SURE YOU:   Understand these instructions  Will watch your condition  Will get help right away if you are not doing well or get worse  Thank you for choosing an e-visit. Your e-visit answers were reviewed by a  board certified advanced clinical practitioner to complete your personal care plan. Depending upon the condition, your plan could have included both over the counter or prescription medications. Please review your pharmacy choice. Be sure that the pharmacy you have chosen is open so that you can pick up your prescription now.  If there is a problem you may message your provider in MyChart to have the prescription routed to another pharmacy. Your safety is important to [place]. If you have drug allergies check your prescription carefully.  For the next 24 hours, you can use MyChart to ask questions about today's visit, request a non-urgent call back, or ask for a work or school excuse from your e-visit provider. You will get an email in the next two days asking about your experience. I hope that your e-visit has been valuable and will speed your recovery.   Greater than 5 minutes, yet less than 10 minutes of time have been spent researching, coordinating, and implementing care for this patient.

## 2020-01-15 ENCOUNTER — Emergency Department: Admission: EM | Admit: 2020-01-15 | Discharge status: Absent without leave | Payer: Self-pay

## 2020-01-15 ENCOUNTER — Emergency Department (INDEPENDENT_AMBULATORY_CARE_PROVIDER_SITE_OTHER)
Admission: EM | Admit: 2020-01-15 | Discharge: 2020-01-15 | Disposition: A | Discharge status: Home / Self Care | Payer: Medicaid Other | Source: Home / Self Care

## 2020-01-15 ENCOUNTER — Other Ambulatory Visit: Payer: Self-pay

## 2020-01-15 DIAGNOSIS — J988 Other specified respiratory disorders: Secondary | ICD-10-CM

## 2020-01-15 DIAGNOSIS — B9789 Other viral agents as the cause of diseases classified elsewhere: Secondary | ICD-10-CM

## 2020-01-15 DIAGNOSIS — J029 Acute pharyngitis, unspecified: Secondary | ICD-10-CM

## 2020-01-15 LAB — POCT INFLUENZA A/B
Influenza A, POC: NEGATIVE
Influenza B, POC: NEGATIVE

## 2020-01-15 MED ORDER — PROMETHAZINE-DM 6.25-15 MG/5ML PO SYRP: 5.0000 mL | ORAL_SOLUTION | Freq: Three times a day (TID) | ORAL | 0 refills | Status: DC | PRN

## 2020-01-15 MED ORDER — LIDOCAINE VISCOUS HCL 2 % MT SOLN
15.0000 mL | OROMUCOSAL | 0 refills | Status: DC | PRN
Start: 2020-01-15 — End: 2020-05-24

## 2020-01-15 NOTE — ED Provider Notes (Addendum)
[facility]    CSN: [id number] Arrival date & time: 01/15/20  1732      History   Chief Complaint Chief Complaint  Patient presents with  . Fever  . Sore Throat  . Cough    HPI Tracy Mcconnell is a 24 y.o. female.   HPI  Patient presents today with symptoms of sore throat x2 days, nonproductive cough and low-grade fever.  Patient in endorses right flank pain with breathing.  She also endorses some blood draining from her ears.  Patient had to ED visit encounters on yesterday with similar complaints.  Patient was advised at that time symptoms were likely viral treat symptomatically. TMAX  99.8 F.  She has not taken any antipyretics today.  Denies any shortness of breath.  No known exposure to anyone positive for Covid or influenza.  She has not had her influenza vaccine as of yet. Past Medical History:  Diagnosis Date  . Anemia   . Anxiety   . Depression   . GERD (gastroesophageal reflux disease)   . Hypovitaminosis D 04/07/2019  . Left eye complaint 06/08/2019  . Migraines     Patient Active Problem List   Diagnosis Date Noted  . Left lumbar radiculitis 07/24/2019  . Vitamin D deficiency 07/15/2019  . 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 Numbness 06/25/2019  . Blurry vision   . Generalized anxiety disorder   . Optic neuritis 06/10/2019  . Hypovitaminosis D 04/07/2019  . Migraines 10/28/2018  . Lumbar radiculopathy 10/28/2018  . Chronic daily headache 02/04/2017    Past Surgical History:  Procedure Laterality Date  . CHOLECYSTECTOMY N/A 10/19/2019   Procedure: LAPAROSCOPIC CHOLECYSTECTOMY;  Surgeon: Berna Bue, MD;  Location: [facility];  Service: General;  Laterality: N/A;    OB History   No obstetric history on file.      Home Medications    Prior to Admission medications   Medication Sig Start Date End Date Taking? Authorizing Provider  Botulinum Toxin Type A (BOTOX) 200 units SOLR Inject 155 units intramuscularly  into head and neck muscles every 3 months by provider. 01/07/20   Lomax, Amy, NP  buPROPion (WELLBUTRIN XL) 300 MG 24 hr tablet Take 1 tablet (300 mg total) by mouth in the morning. 01/04/20 02/03/20  Janeece Agee, NP  busPIRone (BUSPAR) 30 MG tablet Take 30 mg by mouth 2 (two) times daily.    [provider]  cyclobenzaprine (FLEXERIL) 10 MG tablet Take 1 tablet (10 mg total) by mouth 3 (three) times daily as needed for muscle spasms. 12/05/19   Worthy Rancher B, FNP  ergocalciferol (VITAMIN D2) 1.25 MG (50000 UT) capsule Take 50,000 Units by mouth every Friday.     [provider]  FLUoxetine (PROZAC) 40 MG capsule Take 1 capsule (40 mg total) by mouth in the morning. 11/11/19 11/05/20  Janeece Agee, NP  gabapentin (NEURONTIN) 600 MG tablet Take 1 tablet (600 mg total) by mouth in the morning, at noon, in the evening, and at bedtime. 08/06/19 12/16/19  Sater, Pearletha Furl, MD  LAVENDER OIL PO Take 1 capsule by mouth daily.    [provider]  LORazepam (ATIVAN) 1 MG tablet Take 1 tablet (1 mg total) by mouth every 6 (six) hours as needed for anxiety. 01/05/20   Janeece Agee, NP  magnesium oxide (MAG-OX) 400 MG tablet Take 400 mg by mouth daily.    [provider]  naproxen sodium (ALEVE) 220 MG tablet Take 440 mg  by mouth 2 (two) times daily as needed (pain).    [provider]  omeprazole (PRILOSEC) 20 MG capsule Take 1 capsule (20 mg total) by mouth daily. 10/11/19   Jannifer Rodney A, FNP  OnabotulinumtoxinA (BOTOX IJ) Inject 1 Dose as directed every 3 (three) months.    [provider]  oxyCODONE (ROXICODONE) 5 MG immediate release tablet Take 1 tablet (5 mg total) by mouth every 4 (four) hours as needed for severe pain. 10/20/19   Janeece Agee, NP  QUEtiapine (SEROQUEL) 50 MG tablet 1/2 to 1 tab qhs for sleep 01/05/20   Janeece Agee, NP  Rimegepant Sulfate (NURTEC) 75 MG TBDP Take 75 mg by mouth daily as needed (take for abortive therapy of  migraine, no more than 1 tablet in 24 hours or 10 per month). 12/09/19   Lomax, Amy, NP  rizatriptan (MAXALT-MLT) 10 MG disintegrating tablet Take 1 tablet (10 mg total) by mouth as needed for migraine (and may repeat once in 2 hours, if no relief (DISSOLVE IN THE MOUTH)). Patient taking differently: Take 10 mg by mouth as needed for migraine. May repeat in once in 2 hours. 09/17/19   Lomax, Amy, NP    Family History Family History  Problem Relation Age of Onset  . Migraines Mother   . Thyroid disease Mother   . Cancer Maternal Grandmother   . Diabetes Paternal Grandfather   . Hypertension Maternal Grandfather   . Diabetes Paternal Grandmother   . Stroke Paternal Grandmother     Social History Social History   Tobacco Use  . Smoking status: Never Smoker  . Smokeless tobacco: Never Used  Vaping Use  . Vaping Use: Never used  Substance Use Topics  . Alcohol use: No  . Drug use: No     Allergies   Sumatriptan and Other   Review of Systems Review of Systems Pertinent negatives listed in HPI Physical Exam Triage Vital Signs ED Triage Vitals [01/15/20 1747]  Enc Vitals Group     BP 114/80     Pulse Rate 96     Resp 18     Temp 98.8 F (37.1 C)     Temp Source Oral     SpO2 96 %     Weight      Height      Head Circumference      Peak Flow      Pain Score 0     Pain Loc      Pain Edu?      Excl. in GC?    No data found.  Updated Vital Signs BP 114/80 (BP Location: Right Arm)   Pulse 96   Temp 98.8 F (37.1 C) (Oral)   Resp 18   LMP 01/11/2020   SpO2 96%   Visual Acuity Right Eye Distance:   Left Eye Distance:   Bilateral Distance:    Right Eye Near:   Left Eye Near:    Bilateral Near:     Physical Exam General appearance: alert, well developed, well nourished, cooperative and in no distress Head: Normocephalic, without obvious abnormality, atraumatic Nares: Bilateral nares slightly erythematous with scant nasal discharge present Respiratory:  Respirations even and unlabored, normal respiratory rate Heart: rate and rhythm normal. No gallop or murmurs noted on exam  Abdomen: BS +, no distention, no rebound tenderness, or no mass Skin: Skin color, texture, turgor normal. No rashes seen  Psych: Appropriate mood and affect. UC Treatments / Results  Labs (all labs ordered  are listed, but only abnormal results are displayed) Labs Reviewed - No data to display  EKG   Radiology No results found.  Procedures Procedures (including critical care time)  Medications Ordered in UC Medications - No data to display  Initial Impression / Assessment and Plan / UC Course  I have reviewed the triage vital signs and the nursing notes.  Pertinent labs & imaging results that were available during my care of the patient were reviewed by me and considered in my medical decision making (see chart for details).    Rapid influenza negative. PCR Covid pending. Continue with supportive therapy by management of symptoms.  Patient is afebrile.  Nontoxic appearance.  Advised to continue with rest, hydration.  See discharge medications as prescribed below.  Along with discharge instructions. Final Clinical Impressions(s) / UC Diagnoses   Final diagnoses:  Viral respiratory illness  Pharyngitis, unspecified etiology     Discharge Instructions     Influenza test was negative. COVID-19 test pending your results will be available via MyChart within 48 to 72 hours.  Continue masking and social distancing until results are known.  Take medication as prescribed.  Continue Xyzal daily.  Take Tylenol or ibuprofen as needed for achiness.  If symptoms worsen or do not improve follow-up with primary care if symptoms become severe go immediately to the Emergency Department.    ED Prescriptions    Medication Sig Dispense Auth. Provider   promethazine-dextromethorphan (PROMETHAZINE-DM) 6.25-15 MG/5ML syrup Take 5 mLs by mouth 3 (three) times daily as needed  for cough. 140 mL Bing Neighbors, FNP   lidocaine (XYLOCAINE) 2 % solution Use as directed 15 mLs in the mouth or throat every 4 (four) hours as needed for mouth pain (mix with warm salt water, gargle and spit as needed for throat pain). 60 mL Bing Neighbors, FNP     PDMP not reviewed this encounter.   Bing Neighbors, FNP 01/15/20 1845    Bing Neighbors, FNP 01/15/20 1849

## 2020-01-15 NOTE — Discharge Instructions (Addendum)
Influenza test was negative. COVID-19 test pending your results will be available via MyChart within 48 to 72 hours.  Continue masking and social distancing until results are known.  Take medication as prescribed.  Continue Xyzal daily.  Take Tylenol or ibuprofen as needed for achiness.  If symptoms worsen or do not improve follow-up with primary care if symptoms become severe go immediately to the Emergency Department.

## 2020-01-15 NOTE — ED Triage Notes (Signed)
Pt c/o cold sxs x 2 days. Sore throat, cough and low grade fever. Now says some pain in RT side of upper abd area when she breathes. Also says she noticed some blood from her ears. Xyzal prn. No known covid exposure. Has had covid vaccinations (April)

## 2020-01-16 ENCOUNTER — Inpatient Hospital Stay (HOSPITAL_COMMUNITY)
Admission: EM | Admit: 2020-01-16 | Discharge: 2020-01-18 | DRG: 343 | Disposition: A | Discharge status: Home / Self Care | Payer: Medicaid Other | Attending: Physician Assistant | Admitting: Physician Assistant

## 2020-01-16 ENCOUNTER — Emergency Department (HOSPITAL_COMMUNITY): Payer: Medicaid Other

## 2020-01-16 ENCOUNTER — Other Ambulatory Visit: Payer: Self-pay

## 2020-01-16 ENCOUNTER — Encounter (HOSPITAL_COMMUNITY): Payer: Self-pay

## 2020-01-16 ENCOUNTER — Encounter: Payer: Self-pay | Admitting: Registered Nurse

## 2020-01-16 DIAGNOSIS — K3589 Other acute appendicitis without perforation or gangrene: Principal | ICD-10-CM

## 2020-01-16 DIAGNOSIS — R109 Unspecified abdominal pain: Secondary | ICD-10-CM | POA: Diagnosis not present

## 2020-01-16 DIAGNOSIS — R1031 Right lower quadrant pain: Secondary | ICD-10-CM

## 2020-01-16 DIAGNOSIS — K219 Gastro-esophageal reflux disease without esophagitis: Secondary | ICD-10-CM | POA: Diagnosis present

## 2020-01-16 DIAGNOSIS — Z23 Encounter for immunization: Secondary | ICD-10-CM

## 2020-01-16 DIAGNOSIS — K3580 Unspecified acute appendicitis: Secondary | ICD-10-CM | POA: Diagnosis present

## 2020-01-16 DIAGNOSIS — Z9049 Acquired absence of other specified parts of digestive tract: Secondary | ICD-10-CM

## 2020-01-16 DIAGNOSIS — F32A Depression, unspecified: Secondary | ICD-10-CM | POA: Diagnosis present

## 2020-01-16 DIAGNOSIS — R9431 Abnormal electrocardiogram [ECG] [EKG]: Secondary | ICD-10-CM | POA: Diagnosis not present

## 2020-01-16 DIAGNOSIS — E559 Vitamin D deficiency, unspecified: Secondary | ICD-10-CM | POA: Diagnosis present

## 2020-01-16 DIAGNOSIS — F411 Generalized anxiety disorder: Secondary | ICD-10-CM | POA: Diagnosis present

## 2020-01-16 DIAGNOSIS — Z79899 Other long term (current) drug therapy: Secondary | ICD-10-CM

## 2020-01-16 DIAGNOSIS — Z20822 Contact with and (suspected) exposure to covid-19: Secondary | ICD-10-CM | POA: Diagnosis present

## 2020-01-16 LAB — CBC WITH DIFFERENTIAL/PLATELET
Abs Immature Granulocytes: 0.03 10*3/uL (ref 0.00–0.07)
Basophils Absolute: 0 10*3/uL (ref 0.0–0.1)
Basophils Relative: 0 %
Eosinophils Absolute: 0.1 10*3/uL (ref 0.0–0.5)
Eosinophils Relative: 1 %
HCT: 35 % — ABNORMAL LOW (ref 36.0–46.0)
Hemoglobin: 11.6 g/dL — ABNORMAL LOW (ref 12.0–15.0)
Immature Granulocytes: 0 %
Lymphocytes Relative: 28 %
Lymphs Abs: 2.5 10*3/uL (ref 0.7–4.0)
MCH: 30.3 pg (ref 26.0–34.0)
MCHC: 33.1 g/dL (ref 30.0–36.0)
MCV: 91.4 fL (ref 80.0–100.0)
Monocytes Absolute: 0.7 10*3/uL (ref 0.1–1.0)
Monocytes Relative: 7 %
Neutro Abs: 5.7 10*3/uL (ref 1.7–7.7)
Neutrophils Relative %: 64 %
Platelets: 310 10*3/uL (ref 150–400)
RBC: 3.83 MIL/uL — ABNORMAL LOW (ref 3.87–5.11)
RDW: 12.7 % (ref 11.5–15.5)
WBC: 9 10*3/uL (ref 4.0–10.5)
nRBC: 0 % (ref 0.0–0.2)

## 2020-01-16 LAB — COMPREHENSIVE METABOLIC PANEL
ALT: 16 U/L (ref 0–44)
AST: 21 U/L (ref 15–41)
Albumin: 4.1 g/dL (ref 3.5–5.0)
Alkaline Phosphatase: 79 U/L (ref 38–126)
Anion gap: 9 (ref 5–15)
BUN: 7 mg/dL (ref 6–20)
CO2: 24 mmol/L (ref 22–32)
Calcium: 9 mg/dL (ref 8.9–10.3)
Chloride: 104 mmol/L (ref 98–111)
Creatinine, Ser: 0.75 mg/dL (ref 0.44–1.00)
GFR, Estimated: >60 mL/min (ref >60)
Glucose, Bld: 103 mg/dL — ABNORMAL HIGH (ref 70–99)
Potassium: 3.5 mmol/L (ref 3.5–5.1)
Sodium: 137 mmol/L (ref 135–145)
Total Bilirubin: 0.5 mg/dL (ref 0.3–1.2)
Total Protein: 7.3 g/dL (ref 6.5–8.1)

## 2020-01-16 LAB — NOVEL CORONAVIRUS, NAA: SARS-CoV-2, NAA: NOT DETECTED

## 2020-01-16 LAB — I-STAT BETA HCG BLOOD, ED (MC, WL, AP ONLY): I-stat hCG, quantitative: <5 m[IU]/mL (ref <5)

## 2020-01-16 LAB — BASIC METABOLIC PANEL
Anion gap: 11 (ref 5–15)
BUN: 6 mg/dL (ref 6–20)
CO2: 23 mmol/L (ref 22–32)
Calcium: 8.1 mg/dL — ABNORMAL LOW (ref 8.9–10.3)
Chloride: 105 mmol/L (ref 98–111)
Creatinine, Ser: 0.84 mg/dL (ref 0.44–1.00)
GFR, Estimated: >60 mL/min (ref >60)
Glucose, Bld: 85 mg/dL (ref 70–99)
Potassium: 3.8 mmol/L (ref 3.5–5.1)
Sodium: 139 mmol/L (ref 135–145)

## 2020-01-16 LAB — CBC
HCT: 38.5 % (ref 36.0–46.0)
Hemoglobin: 12.8 g/dL (ref 12.0–15.0)
MCH: 29.8 pg (ref 26.0–34.0)
MCHC: 33.2 g/dL (ref 30.0–36.0)
MCV: 89.5 fL (ref 80.0–100.0)
Platelets: 349 10*3/uL (ref 150–400)
RBC: 4.3 MIL/uL (ref 3.87–5.11)
RDW: 12.4 % (ref 11.5–15.5)
WBC: 9 10*3/uL (ref 4.0–10.5)
nRBC: 0 % (ref 0.0–0.2)

## 2020-01-16 LAB — URINALYSIS, ROUTINE W REFLEX MICROSCOPIC
Bilirubin Urine: NEGATIVE
Glucose, UA: NEGATIVE mg/dL
Hgb urine dipstick: NEGATIVE
Ketones, ur: NEGATIVE mg/dL
Leukocytes,Ua: NEGATIVE
Nitrite: NEGATIVE
Protein, ur: NEGATIVE mg/dL
Specific Gravity, Urine: >1.046 — ABNORMAL HIGH (ref 1.005–1.030)
pH: 7 (ref 5.0–8.0)

## 2020-01-16 LAB — LIPASE, BLOOD: Lipase: 23 U/L (ref 11–51)

## 2020-01-16 LAB — RESPIRATORY PANEL BY RT PCR (FLU A&B, COVID)
Influenza A by PCR: NEGATIVE
Influenza B by PCR: NEGATIVE
SARS Coronavirus 2 by RT PCR: NEGATIVE

## 2020-01-16 LAB — SARS-COV-2, NAA 2 DAY TAT

## 2020-01-16 MED ORDER — PIPERACILLIN-TAZOBACTAM 3.375 G IVPB
3.3750 g | Freq: Three times a day (TID) | INTRAVENOUS | Status: DC
  Administered 2020-01-16 – 2020-01-17 (×3): 3.375 g via INTRAVENOUS
  Filled 2020-01-16 (×3): qty 50

## 2020-01-16 MED ORDER — MAGNESIUM OXIDE 400 (241.3 MG) MG PO TABS
400.0000 mg | ORAL_TABLET | Freq: Every day | ORAL | Status: DC
  Administered 2020-01-16 – 2020-01-18 (×3): 400 mg via ORAL
  Filled 2020-01-16 (×3): qty 1

## 2020-01-16 MED ORDER — FENTANYL CITRATE (PF) 100 MCG/2ML IJ SOLN
50.0000 ug | Freq: Once | INTRAMUSCULAR | Status: AC
  Administered 2020-01-16: 50 ug via INTRAVENOUS
  Filled 2020-01-16: qty 2

## 2020-01-16 MED ORDER — LORAZEPAM 1 MG PO TABS
1.0000 mg | ORAL_TABLET | Freq: Four times a day (QID) | ORAL | Status: DC | PRN
  Administered 2020-01-17: 1 mg via ORAL
  Filled 2020-01-16: qty 1

## 2020-01-16 MED ORDER — SODIUM CHLORIDE 0.9 % IV BOLUS
1000.0000 mL | Freq: Once | INTRAVENOUS | Status: AC
  Administered 2020-01-16: 1000 mL via INTRAVENOUS

## 2020-01-16 MED ORDER — ONDANSETRON HCL 4 MG/2ML IJ SOLN
4.0000 mg | Freq: Four times a day (QID) | INTRAMUSCULAR | Status: DC | PRN
  Administered 2020-01-16: 4 mg via INTRAVENOUS
  Filled 2020-01-16: qty 2

## 2020-01-16 MED ORDER — HYDROMORPHONE HCL 1 MG/ML IJ SOLN
1.0000 mg | INTRAMUSCULAR | Status: DC | PRN
  Administered 2020-01-17 (×3): 1 mg via INTRAVENOUS
  Filled 2020-01-16 (×3): qty 1

## 2020-01-16 MED ORDER — DEXTROSE-NACL 5-0.9 % IV SOLN: INTRAVENOUS | Status: DC

## 2020-01-16 MED ORDER — BUPROPION HCL ER (XL) 300 MG PO TB24
300.0000 mg | ORAL_TABLET | Freq: Every morning | ORAL | Status: DC
  Administered 2020-01-18: 07:00:00 300 mg via ORAL
  Filled 2020-01-16: qty 1

## 2020-01-16 MED ORDER — IOHEXOL 300 MG/ML  SOLN
100.0000 mL | Freq: Once | INTRAMUSCULAR | Status: AC | PRN
  Administered 2020-01-16: 100 mL via INTRAVENOUS

## 2020-01-16 MED ORDER — METOPROLOL TARTRATE 5 MG/5ML IV SOLN: 5.0000 mg | Freq: Four times a day (QID) | INTRAVENOUS | Status: DC | PRN

## 2020-01-16 MED ORDER — RIMEGEPANT SULFATE 75 MG PO TBDP: 75.0000 mg | ORAL_TABLET | Freq: Every day | ORAL | Status: DC | PRN

## 2020-01-16 MED ORDER — IOHEXOL 350 MG/ML SOLN: 100.0000 mL | Freq: Once | INTRAVENOUS | Status: DC | PRN

## 2020-01-16 MED ORDER — METRONIDAZOLE IN NACL 5-0.79 MG/ML-% IV SOLN
500.0000 mg | Freq: Once | INTRAVENOUS | Status: AC
  Administered 2020-01-16: 500 mg via INTRAVENOUS
  Filled 2020-01-16: qty 100

## 2020-01-16 MED ORDER — PANTOPRAZOLE SODIUM 40 MG PO TBEC
40.0000 mg | DELAYED_RELEASE_TABLET | Freq: Every day | ORAL | Status: DC
  Administered 2020-01-16 – 2020-01-18 (×3): 40 mg via ORAL
  Filled 2020-01-16 (×3): qty 1

## 2020-01-16 MED ORDER — SODIUM CHLORIDE 0.9 % IV SOLN
2.0000 g | Freq: Once | INTRAVENOUS | Status: AC
  Administered 2020-01-16: 2 g via INTRAVENOUS
  Filled 2020-01-16: qty 20

## 2020-01-16 MED ORDER — ENOXAPARIN SODIUM 40 MG/0.4ML ~~LOC~~ SOLN
40.0000 mg | SUBCUTANEOUS | Status: DC
  Administered 2020-01-17: 18:00:00 40 mg via SUBCUTANEOUS
  Filled 2020-01-16: qty 0.4

## 2020-01-16 MED ORDER — FLUOXETINE HCL 20 MG PO CAPS
40.0000 mg | ORAL_CAPSULE | Freq: Every morning | ORAL | Status: DC
  Administered 2020-01-18: 40 mg via ORAL
  Filled 2020-01-16: qty 2

## 2020-01-16 MED ORDER — SIMETHICONE 80 MG PO CHEW
40.0000 mg | CHEWABLE_TABLET | Freq: Four times a day (QID) | ORAL | Status: DC | PRN
  Filled 2020-01-16: qty 1

## 2020-01-16 MED ORDER — SODIUM CHLORIDE (PF) 0.9 % IJ SOLN
INTRAMUSCULAR | Status: AC
  Filled 2020-01-16: qty 50

## 2020-01-16 MED ORDER — HYDROMORPHONE HCL 1 MG/ML IJ SOLN
1.0000 mg | INTRAMUSCULAR | Status: DC | PRN
  Administered 2020-01-16 (×3): 1 mg via INTRAVENOUS
  Filled 2020-01-16 (×3): qty 1

## 2020-01-16 MED ORDER — ACETAMINOPHEN 650 MG RE SUPP: 650.0000 mg | Freq: Four times a day (QID) | RECTAL | Status: DC | PRN

## 2020-01-16 MED ORDER — NAPROXEN SODIUM 220 MG PO TABS: 220.0000 mg | ORAL_TABLET | Freq: Two times a day (BID) | ORAL | Status: DC | PRN

## 2020-01-16 MED ORDER — BUSPIRONE HCL 5 MG PO TABS
30.0000 mg | ORAL_TABLET | Freq: Two times a day (BID) | ORAL | Status: DC
  Administered 2020-01-16 – 2020-01-18 (×4): 30 mg via ORAL
  Filled 2020-01-16: qty 3
  Filled 2020-01-16 (×3): qty 6

## 2020-01-16 MED ORDER — ONDANSETRON 4 MG PO TBDP: 4.0000 mg | ORAL_TABLET | Freq: Four times a day (QID) | ORAL | Status: DC | PRN

## 2020-01-16 MED ORDER — QUETIAPINE FUMARATE 50 MG PO TABS
50.0000 mg | ORAL_TABLET | Freq: Every day | ORAL | Status: DC
  Administered 2020-01-16 – 2020-01-18 (×3): 50 mg via ORAL
  Filled 2020-01-16 (×3): qty 1

## 2020-01-16 MED ORDER — ACETAMINOPHEN 325 MG PO TABS: 650.0000 mg | ORAL_TABLET | Freq: Four times a day (QID) | ORAL | Status: DC | PRN

## 2020-01-16 NOTE — ED Notes (Signed)
Pt ambulated to restroom without difficulty. Pt lights dimmed and call bell in reach. No other needs at this time.

## 2020-01-16 NOTE — ED Triage Notes (Signed)
Patient c/o mid abdominal pain since yesterday. Patient was seen at [facility] yesterday for c/o cough, sore throat, and fever. Covid test not back yet.

## 2020-01-16 NOTE — ED Provider Notes (Signed)
[facility]-EMERGENCY DEPT Provider Mcconnell   CSN: [id number] Arrival date & time: 01/16/20  0908     History Chief Complaint  Patient presents with  . Abdominal Pain    Tracy Mcconnell is a 24 y.o. female with past medical history of anemia, anxiety that presents emergency department today for abdominal pain that began this morning.  Patient states that abdominal pain feels like a cramping sensation, is localized to RLQ and periumbilical region. Patient states that pain is a 6 out of 10 currently, woke up like this.  Pain is constant.  No nausea, vomiting, diarrhea, constipation.  Patient was recently seen in urgent care yesterday for URI symptoms including low-grade temperature, sore throat and cough.  Patient states that temperature and sore throat have resolved, however is still having productive cough.  Has been vaccinated against Covid, no sick contacts.  Patient had flu test done yesterday which were negative, Covid test pending. She states that she has not taken anything for this.  States that pain does not radiate anywhere.  Denies any vaginal complaints, no vaginal bleeding, vaginal discharge, vaginal pain.  No chance of pregnancy, no STD chance.  Patient states that she has had gallbladder removed, did have that surgery 3 months ago.  States that she is been doing well with this.  Still has her appendix.  Denies pain elsewhere, no myalgias, headache, dysuria, hematuria.  No chest pain, rib pain, shortness of breath.   HPI     Past Medical History:  Diagnosis Date  . Anemia   . Anxiety   . Depression   . GERD (gastroesophageal reflux disease)   . Hypovitaminosis D 04/07/2019  . Left eye complaint 06/08/2019  . Migraines     Patient Active Problem List   Diagnosis Date Noted  . Abdominal pain 01/16/2020  . Left lumbar radiculitis 07/24/2019  . Vitamin D deficiency 07/15/2019  . 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 Numbness  06/25/2019  . Blurry vision   . Generalized anxiety disorder   . Optic neuritis 06/10/2019  . Hypovitaminosis D 04/07/2019  . Migraines 10/28/2018  . Lumbar radiculopathy 10/28/2018  . Chronic daily headache 02/04/2017    Past Surgical History:  Procedure Laterality Date  . CHOLECYSTECTOMY N/A 10/19/2019   Procedure: LAPAROSCOPIC CHOLECYSTECTOMY;  Surgeon: Berna Bue, MD;  Location: [facility];  Service: General;  Laterality: N/A;     OB History   No obstetric history on file.     Family History  Problem Relation Age of Onset  . Migraines Mother   . Thyroid disease Mother   . Cancer Maternal Grandmother   . Diabetes Paternal Grandfather   . Hypertension Maternal Grandfather   . Diabetes Paternal Grandmother   . Stroke Paternal Grandmother     Social History   Tobacco Use  . Smoking status: Never Smoker  . Smokeless tobacco: Never Used  Vaping Use  . Vaping Use: Never used  Substance Use Topics  . Alcohol use: No  . Drug use: No    Home Medications Prior to Admission medications   Medication Sig Start Date End Date Taking? Authorizing Provider  Botulinum Toxin Type A (BOTOX) 200 units SOLR Inject 155 units intramuscularly into head and neck muscles every 3 months by provider. 01/07/20   Lomax, Amy, NP  buPROPion (WELLBUTRIN XL) 300 MG 24 hr tablet Take 1 tablet (300 mg total) by mouth in the morning. 01/04/20 02/03/20  Janeece Agee, NP  busPIRone Orbie Hurst)  30 MG tablet Take 30 mg by mouth 2 (two) times daily.    [provider]  cyclobenzaprine (FLEXERIL) 10 MG tablet Take 1 tablet (10 mg total) by mouth 3 (three) times daily as needed for muscle spasms. 12/05/19   Worthy Rancher B, FNP  ergocalciferol (VITAMIN D2) 1.25 MG (50000 UT) capsule Take 50,000 Units by mouth every Friday.     [provider]  FLUoxetine (PROZAC) 40 MG capsule Take 1 capsule (40 mg total) by mouth in the morning. 11/11/19 11/05/20  Janeece Agee, NP  gabapentin (NEURONTIN)  600 MG tablet Take 1 tablet (600 mg total) by mouth in the morning, at noon, in the evening, and at bedtime. 08/06/19 12/16/19  Sater, Pearletha Furl, MD  LAVENDER OIL PO Take 1 capsule by mouth daily.    [provider]  lidocaine (XYLOCAINE) 2 % solution Use as directed 15 mLs in the mouth or throat every 4 (four) hours as needed for mouth pain (mix with warm salt water, gargle and spit as needed for throat pain). 01/15/20   Bing Neighbors, FNP  LORazepam (ATIVAN) 1 MG tablet Take 1 tablet (1 mg total) by mouth every 6 (six) hours as needed for anxiety. 01/05/20   Janeece Agee, NP  magnesium oxide (MAG-OX) 400 MG tablet Take 400 mg by mouth daily.    [provider]  naproxen sodium (ALEVE) 220 MG tablet Take 440 mg by mouth 2 (two) times daily as needed (pain).    [provider]  omeprazole (PRILOSEC) 20 MG capsule Take 1 capsule (20 mg total) by mouth daily. 10/11/19   Jannifer Rodney A, FNP  OnabotulinumtoxinA (BOTOX IJ) Inject 1 Dose as directed every 3 (three) months.    [provider]  oxyCODONE (ROXICODONE) 5 MG immediate release tablet Take 1 tablet (5 mg total) by mouth every 4 (four) hours as needed for severe pain. 10/20/19   Janeece Agee, NP  promethazine-dextromethorphan (PROMETHAZINE-DM) 6.25-15 MG/5ML syrup Take 5 mLs by mouth 3 (three) times daily as needed for cough. 01/15/20   Bing Neighbors, FNP  QUEtiapine (SEROQUEL) 50 MG tablet 1/2 to 1 tab qhs for sleep 01/05/20   Janeece Agee, NP  Rimegepant Sulfate (NURTEC) 75 MG TBDP Take 75 mg by mouth daily as needed (take for abortive therapy of migraine, no more than 1 tablet in 24 hours or 10 per month). 12/09/19   Lomax, Amy, NP  rizatriptan (MAXALT-MLT) 10 MG disintegrating tablet Take 1 tablet (10 mg total) by mouth as needed for migraine (and may repeat once in 2 hours, if no relief (DISSOLVE IN THE MOUTH)). Patient taking differently: Take 10 mg by mouth as needed for migraine. May repeat in  once in 2 hours. 09/17/19   Lomax, Amy, NP    Allergies    Sumatriptan and Other  Review of Systems   Review of Systems  Constitutional: Negative for chills, diaphoresis, fatigue and fever.  HENT: Negative for congestion, sore throat and trouble swallowing.   Eyes: Negative for pain and visual disturbance.  Respiratory: Positive for cough. Negative for chest tightness, shortness of breath and wheezing.   Cardiovascular: Negative for chest pain, palpitations and leg swelling.  Gastrointestinal: Positive for abdominal pain. Negative for abdominal distention, diarrhea, nausea and vomiting.  Genitourinary: Negative for difficulty urinating.  Musculoskeletal: Negative for back pain, neck pain and neck stiffness.  Skin: Negative for pallor.  Neurological: Negative for dizziness, speech difficulty, weakness and headaches.  Psychiatric/Behavioral: Negative for confusion.  Physical Exam Updated Vital Signs BP (!) 93/59   Pulse 90   Temp 98.1 F (36.7 C) (Oral)   Resp 16   Ht 5\' 1"  (1.549 m)   Wt 56.3 kg   LMP 01/11/2020   SpO2 100%   BMI 23.47 kg/m   Physical Exam Constitutional:      General: She is not in acute distress.    Appearance: Normal appearance. She is not ill-appearing, toxic-appearing or diaphoretic.  HENT:     Mouth/Throat:     Mouth: Mucous membranes are moist.     Pharynx: Oropharynx is clear.  Eyes:     General: No scleral icterus.    Extraocular Movements: Extraocular movements intact.     Pupils: Pupils are equal, round, and reactive to light.  Cardiovascular:     Rate and Rhythm: Normal rate and regular rhythm.     Pulses: Normal pulses.     Heart sounds: Normal heart sounds.  Pulmonary:     Effort: Pulmonary effort is normal. No respiratory distress.     Breath sounds: Normal breath sounds. No stridor. No wheezing, rhonchi or rales.  Chest:     Chest wall: No tenderness.  Abdominal:     General: Abdomen is flat. There is no distension.      Palpations: Abdomen is soft.     Tenderness: There is abdominal tenderness in the right lower quadrant and periumbilical area. There is no right CVA tenderness, left CVA tenderness, guarding or rebound. Positive signs include McBurney's sign. Negative signs include Murphy's sign, Rovsing's sign and psoas sign.    Musculoskeletal:        General: No swelling or tenderness. Normal range of motion.     Cervical back: Normal range of motion and neck supple. No rigidity.     Right lower leg: No edema.     Left lower leg: No edema.  Skin:    General: Skin is warm and dry.     Capillary Refill: Capillary refill takes less than 2 seconds.     Coloration: Skin is not pale.  Neurological:     General: No focal deficit present.     Mental Status: She is alert and oriented to person, place, and time.  Psychiatric:        Mood and Affect: Mood normal.        Behavior: Behavior normal.     ED Results / Procedures / Treatments   Labs (all labs ordered are listed, but only abnormal results are displayed) Labs Reviewed  COMPREHENSIVE METABOLIC PANEL - Abnormal; Notable for the following components:      Result Value   Glucose, Bld 103 (*)    All other components within normal limits  RESPIRATORY PANEL BY RT PCR (FLU A&B, COVID)  LIPASE, BLOOD  CBC  URINALYSIS, ROUTINE W REFLEX MICROSCOPIC  CBC WITH DIFFERENTIAL/PLATELET  BASIC METABOLIC PANEL  I-STAT BETA HCG BLOOD, ED ([facility], [facility], [facility] ONLY)    EKG EKG Interpretation  Date/Time:  Saturday January 16 2020 10:21:07 EDT Ventricular Rate:  95 PR Interval:    QRS Duration: 81 QT Interval:  368 QTC Calculation: 463 R Axis:   76 Text Interpretation: Sinus rhythm Borderline repolarization abnormality No significant change since last tracing Confirmed by [id number] (Gwyneth Sprout) on 01/16/2020 10:24:59 AM   Radiology CT Abdomen Pelvis W Contrast  Result Date: 01/16/2020 CLINICAL DATA:  Acute non localized right lower quadrant abdominal pain.  EXAM: CT ABDOMEN AND PELVIS WITH CONTRAST TECHNIQUE: Multidetector CT  imaging of the abdomen and pelvis was performed using the standard protocol following bolus administration of intravenous contrast. CONTRAST:  OMNIPAQUE IOHEXOL 300 MG/ML  SOLN COMPARISON:  CT abdomen and pelvis-05/28/2019 FINDINGS: Lower chest: Limited visualization of the lower thorax demonstrates minimal bibasilar dependent subpleural atelectasis. No discrete focal airspace opacities. Normal heart size.  No pericardial effusion. Hepatobiliary: Normal hepatic contour. No discrete hepatic lesions. Post cholecystectomy. No intra or extrahepatic biliary duct dilatation. No ascites. Pancreas: Normal appearance of the pancreas. Spleen: Normal appearance of the spleen. Adrenals/Urinary Tract: There is symmetric enhancement of the bilateral kidneys. No definite renal stones on this postcontrast examination. No discrete renal lesions. No urinary obstruction or perinephric stranding. Normal appearance the bilateral adrenal glands. Normal appearance of the urinary bladder given degree of distention. Stomach/Bowel: The appendix is of normal size however there is apparent wall hyperemia. No appendicoliths or definitive evidence of periappendiceal stranding. The bowel is otherwise normal in course and caliber. Normal appearance of the terminal ileum. No discrete areas of bowel wall thickening. No pneumoperitoneum, pneumatosis or portal venous gas. Vascular/Lymphatic: Normal caliber of the abdominal aorta. The major branch vessels of the abdominal aorta appear patent on this non CTA examination. No bulky retroperitoneal, mesenteric, pelvic or inguinal lymphadenopathy. Reproductive: Punctate (approximately 1 cm) right-sided presumably physiologic adnexal cyst (coronal image 64, series 5). No discrete left-sided adnexal lesions. There is a small amount of presumably physiologic free fluid in the pelvic cul-de-sac. Other: Regional soft tissues appear  normal. Musculoskeletal: No acute or aggressive osseous abnormalities. IMPRESSION: 1. The appendix is of normal size however there is apparent wall hyperemia without evidence of an appendicolith or definitive periappendiceal stranding - while potentially attributable to phase of enhancement, conceivably an early appendicitis could have a similar appearance. Clinical correlation is advised. 2. Suspected punctate (approximately 1 cm) right-sided physiologic adnexal cyst with small amount of free fluid in the pelvic cul-de-sac. 3. Otherwise, no explanation for patient's right lower quadrant abdominal pain. Specifically, no evidence of enteric or urinary obstruction. 4. Post cholecystectomy. Electronically Signed   By: Simonne Come M.D.   On: 01/16/2020 12:51    Procedures Procedures (including critical care time)  Medications Ordered in ED Medications  cefTRIAXone (ROCEPHIN) 2 g in sodium chloride 0.9 % 100 mL IVPB (2 g Intravenous New Bag/Given 01/16/20 1344)    And  metroNIDAZOLE (FLAGYL) IVPB 500 mg (500 mg Intravenous New Bag/Given 01/16/20 1344)  HYDROmorphone (DILAUDID) injection 1 mg (has no administration in time range)  piperacillin-tazobactam (ZOSYN) IVPB 3.375 g (has no administration in time range)  fentaNYL (SUBLIMAZE) injection 50 mcg (50 mcg Intravenous Given 01/16/20 1035)  sodium chloride (PF) 0.9 % injection (  Given by Other 01/16/20 1223)  iohexol (OMNIPAQUE) 300 MG/ML solution 100 mL (100 mLs Intravenous Contrast Given 01/16/20 1217)  fentaNYL (SUBLIMAZE) injection 50 mcg (50 mcg Intravenous Given 01/16/20 1337)  sodium chloride 0.9 % bolus 1,000 mL (1,000 mLs Intravenous Bolus 01/16/20 1337)    ED Course  I have reviewed the triage vital signs and the nursing notes.  Pertinent labs & imaging results that were available during my care of the patient were reviewed by me and considered in my medical decision making (see chart for details).    MDM Rules/Calculators/A&P                           Amberley Mcconnell is a 24 y.o. female with past medical history of anemia, anxiety that presents  emergency department today for abdominal pain that began this morning.  Differential diagnoses considered include colitis, viral gastroenteritis, appendicitis.  Initial interventions fentanyl.  Labs demonstrated unremarkable CBC, CMP, lipase. Covid swab negative.  CT shows questionable early signs of appendicitis  135 spoke to Dr. Luisa Hart, surgery who suggests observation overnight.  Did suggest antibiotics and clear liquid diet.  He states that he will put orders in.  IV antibiotics and fluids started.  Patient still complaining of right lower quadrant pain, more fentanyl given for pain control.  The patient appears reasonably stabilized for admission considering the current resources, flow, and capabilities available in the ED at this time, and I doubt any other [facility] requiring further screening and/or treatment in the ED prior to admission.   Final Clinical Impression(s) / ED Diagnoses Final diagnoses:  RLQ abdominal pain  Other acute appendicitis    Rx / DC Orders ED Discharge Orders    None       Farrel Gordon, PA-C 01/16/20 1405    Gwyneth Sprout, MD 01/16/20 2018

## 2020-01-16 NOTE — H&P (Signed)
Tracy Mcconnell is an 24 y.o. female.   Chief Complaint: Abdominal pain HPI: Patient presents emergency room today with the chief complaint of right lower quadrant abdominal pain.  She awoke at 6 AM and had a burning pain in her right lower quadrant.  This presented as she awoke so time course is not well known.  The pain continued with a burning quality pain which was anywhere from a 4-7 out of 10 without radiation.  Patient denies any blood in urine or dysuria.  No fever or chills.  She was evaluated in the emergency room and CT scan was obtained which showed a normal-sized appendix with some mild enhancement of the wall of the appendix.  There is no appendicolith or signs of appendicitis on CT scan but this was felt to be early and this could represent possible early appendicitis.  She feels about the same as when she woke up today but no worse.  Past Medical History:  Diagnosis Date  . Anemia   . Anxiety   . Depression   . GERD (gastroesophageal reflux disease)   . Hypovitaminosis D 04/07/2019  . Left eye complaint 06/08/2019  . Migraines     Past Surgical History:  Procedure Laterality Date  . CHOLECYSTECTOMY N/A 10/19/2019   Procedure: LAPAROSCOPIC CHOLECYSTECTOMY;  Surgeon: Berna Bue, MD;  Location: [facility];  Service: General;  Laterality: N/A;    Family History  Problem Relation Age of Onset  . Migraines Mother   . Thyroid disease Mother   . Cancer Maternal Grandmother   . Diabetes Paternal Grandfather   . Hypertension Maternal Grandfather   . Diabetes Paternal Grandmother   . Stroke Paternal Grandmother    Social History:  reports that she has never smoked. She has never used smokeless tobacco. She reports that she does not drink alcohol and does not use drugs.  Allergies:  Allergies  Allergen Reactions  . Sumatriptan Shortness Of Breath and Other (See Comments)    Entire body "felt heavy"  . Other Itching, Swelling and Other (See Comments)    Pesticides  (family history)- Exposure face and mouth itch and the tongue/lips swell    (Not in a hospital admission)   Results for orders placed or performed during the hospital encounter of 01/16/20 (from the past 48 hour(s))  Urinalysis, Routine w reflex microscopic Urine, Clean Catch     Status: Abnormal   Collection Time: 01/16/20  9:33 AM  Result Value Ref Range   Color, Urine STRAW (A) YELLOW   APPearance CLEAR CLEAR   Specific Gravity, Urine >1.046 (H) 1.005 - 1.030   pH 7.0 5.0 - 8.0   Glucose, UA NEGATIVE NEGATIVE mg/dL   Hgb urine dipstick NEGATIVE NEGATIVE   Bilirubin Urine NEGATIVE NEGATIVE   Ketones, ur NEGATIVE NEGATIVE mg/dL   Protein, ur NEGATIVE NEGATIVE mg/dL   Nitrite NEGATIVE NEGATIVE   Leukocytes,Ua NEGATIVE NEGATIVE    Comment: Performed at [facility], [address], [place], [place] [address]  Lipase, blood     Status: None   Collection Time: 01/16/20 10:09 AM  Result Value Ref Range   Lipase 23 11 - 51 U/L    Comment: Performed at [facility], [address] [place], [place] [address]  Comprehensive metabolic panel     Status: Abnormal   Collection Time: 01/16/20 10:09 AM  Result Value Ref Range   Sodium 137 135 - 145 mmol/L   Potassium 3.5 3.5 - 5.1 mmol/L  Chloride 104 98 - 111 mmol/L   CO2 24 22 - 32 mmol/L   Glucose, Bld 103 (H) 70 - 99 mg/dL    Comment: Glucose reference range applies only to samples taken after fasting for at least 8 hours.   BUN 7 6 - 20 mg/dL   Creatinine, Ser 9.93 0.44 - 1.00 mg/dL   Calcium 9.0 8.9 - 57.0 mg/dL   Total Protein 7.3 6.5 - 8.1 g/dL   Albumin 4.1 3.5 - 5.0 g/dL   AST 21 15 - 41 U/L   ALT 16 0 - 44 U/L   Alkaline Phosphatase 79 38 - 126 U/L   Total Bilirubin 0.5 0.3 - 1.2 mg/dL   GFR, Estimated >17 >79 mL/min   Anion gap 9 5 - 15    Comment: Performed at [facility], [address], [place], [place] [address]  CBC     Status: None   Collection Time:  01/16/20 10:09 AM  Result Value Ref Range   WBC 9.0 4.0 - 10.5 K/uL   RBC 4.30 3.87 - 5.11 MIL/uL   Hemoglobin 12.8 12.0 - 15.0 g/dL   HCT 09.2 36 - 46 %   MCV 89.5 80.0 - 100.0 fL   MCH 29.8 26.0 - 34.0 pg   MCHC 33.2 30.0 - 36.0 g/dL   RDW 33.0 07.6 - 22.6 %   Platelets 349 150 - 400 K/uL   nRBC 0.0 0.0 - 0.2 %    Comment: Performed at [facility], [address], [place], [place] [address]  Respiratory Panel by RT PCR (Flu A&B, Covid) - Nasopharyngeal Swab     Status: None   Collection Time: 01/16/20 10:09 AM   Specimen: Nasopharyngeal Swab  Result Value Ref Range   SARS Coronavirus 2 by RT PCR NEGATIVE NEGATIVE    Comment: (NOTE) SARS-CoV-2 target nucleic acids are NOT DETECTED.  The SARS-CoV-2 RNA is generally detectable in upper respiratoy specimens during the acute phase of infection. The lowest concentration of SARS-CoV-2 viral copies this assay can detect is 131 copies/mL. A negative result does not preclude SARS-Cov-2 infection and should not be used as the sole basis for treatment or other patient management decisions. A negative result may occur with  improper specimen collection/handling, submission of specimen other than nasopharyngeal swab, presence of viral mutation(s) within the areas targeted by this assay, and inadequate number of viral copies (<131 copies/mL). A negative result must be combined with clinical observations, patient history, and epidemiological information. The expected result is Negative.  Fact Sheet for Patients:  https://www.moore.com/  Fact Sheet for Healthcare Providers:  https://www.young.biz/  This test is no t yet approved or cleared by the [place] FDA and  has been authorized for detection and/or diagnosis of SARS-CoV-2 by FDA under an Emergency Use Authorization (EUA). This EUA will remain  in effect (meaning this test can be used) for the duration of the COVID-19  declaration under Section 564(b)(1) of the Act, 21 U.S.C. section 360bbb-3(b)(1), unless the authorization is terminated or revoked sooner.     Influenza A by PCR NEGATIVE NEGATIVE   Influenza B by PCR NEGATIVE NEGATIVE    Comment: (NOTE) The Xpert Xpress SARS-CoV-2/FLU/RSV assay is intended as an aid in  the diagnosis of influenza from Nasopharyngeal swab specimens and  should not be used as a sole basis for treatment. Nasal washings and  aspirates are unacceptable for Xpert Xpress SARS-CoV-2/FLU/RSV  testing.  Fact Sheet for Patients: https://www.moore.com/  Fact  Sheet for Healthcare Providers: https://www.young.biz/  This test is not yet approved or cleared by the [place] and  has been authorized for detection and/or diagnosis of SARS-CoV-2 by  FDA under an Emergency Use Authorization (EUA). This EUA will remain  in effect (meaning this test can be used) for the duration of the  Covid-19 declaration under Section 564(b)(1) of the Act, 21  U.S.C. section 360bbb-3(b)(1), unless the authorization is  terminated or revoked. Performed at [facility], [address] [place], [place] [address]   I-Stat beta hCG blood, ED     Status: None   Collection Time: 01/16/20 10:31 AM  Result Value Ref Range   I-stat hCG, quantitative <5.0 <5 mIU/mL   Comment 3            Comment:   GEST. AGE      CONC.  (mIU/mL)   <=1 WEEK        5 - 50     2 WEEKS       50 - 500     3 WEEKS       100 - 10,000     4 WEEKS     1,000 - 30,000        FEMALE AND NON-PREGNANT FEMALE:     LESS THAN 5 mIU/mL   CBC with Differential/Platelet     Status: Abnormal   Collection Time: 01/16/20  1:40 PM  Result Value Ref Range   WBC 9.0 4.0 - 10.5 K/uL   RBC 3.83 (L) 3.87 - 5.11 MIL/uL   Hemoglobin 11.6 (L) 12.0 - 15.0 g/dL   HCT 32.2 (L) 36 - 46 %   MCV 91.4 80.0 - 100.0 fL   MCH 30.3 26.0 - 34.0 pg   MCHC 33.1 30.0 - 36.0 g/dL   RDW 02.5  42.7 - 06.2 %   Platelets 310 150 - 400 K/uL   nRBC 0.0 0.0 - 0.2 %   Neutrophils Relative % 64 %   Neutro Abs 5.7 1.7 - 7.7 K/uL   Lymphocytes Relative 28 %   Lymphs Abs 2.5 0.7 - 4.0 K/uL   Monocytes Relative 7 %   Monocytes Absolute 0.7 0.1 - 1.0 K/uL   Eosinophils Relative 1 %   Eosinophils Absolute 0.1 0.0 - 0.5 K/uL   Basophils Relative 0 %   Basophils Absolute 0.0 0.0 - 0.1 K/uL   Immature Granulocytes 0 %   Abs Immature Granulocytes 0.03 0.00 - 0.07 K/uL    Comment: Performed at [facility], [address], [place], [place] [address]  Basic metabolic panel     Status: Abnormal   Collection Time: 01/16/20  1:40 PM  Result Value Ref Range   Sodium 139 135 - 145 mmol/L   Potassium 3.8 3.5 - 5.1 mmol/L   Chloride 105 98 - 111 mmol/L   CO2 23 22 - 32 mmol/L   Glucose, Bld 85 70 - 99 mg/dL    Comment: Glucose reference range applies only to samples taken after fasting for at least 8 hours.   BUN 6 6 - 20 mg/dL   Creatinine, Ser 3.15 0.44 - 1.00 mg/dL   Calcium 8.1 (L) 8.9 - 10.3 mg/dL   GFR, Estimated >17 >61 mL/min   Anion gap 11 5 - 15    Comment: Performed at [facility], [address] [place], [place] [address]   CT Abdomen Pelvis W Contrast  Result Date: 01/16/2020 CLINICAL DATA:  Acute non localized right lower quadrant abdominal pain. EXAM: CT ABDOMEN AND PELVIS WITH CONTRAST TECHNIQUE: Multidetector CT imaging of the abdomen and pelvis was performed using the standard protocol following bolus administration of intravenous contrast. CONTRAST:  OMNIPAQUE IOHEXOL 300 MG/ML  SOLN COMPARISON:  CT abdomen and pelvis-05/28/2019 FINDINGS: Lower chest: Limited visualization of the lower thorax demonstrates minimal bibasilar dependent subpleural atelectasis. No discrete focal airspace opacities. Normal heart size.  No pericardial effusion. Hepatobiliary: Normal hepatic contour. No discrete hepatic lesions. Post cholecystectomy. No  intra or extrahepatic biliary duct dilatation. No ascites. Pancreas: Normal appearance of the pancreas. Spleen: Normal appearance of the spleen. Adrenals/Urinary Tract: There is symmetric enhancement of the bilateral kidneys. No definite renal stones on this postcontrast examination. No discrete renal lesions. No urinary obstruction or perinephric stranding. Normal appearance the bilateral adrenal glands. Normal appearance of the urinary bladder given degree of distention. Stomach/Bowel: The appendix is of normal size however there is apparent wall hyperemia. No appendicoliths or definitive evidence of periappendiceal stranding. The bowel is otherwise normal in course and caliber. Normal appearance of the terminal ileum. No discrete areas of bowel wall thickening. No pneumoperitoneum, pneumatosis or portal venous gas. Vascular/Lymphatic: Normal caliber of the abdominal aorta. The major branch vessels of the abdominal aorta appear patent on this non CTA examination. No bulky retroperitoneal, mesenteric, pelvic or inguinal lymphadenopathy. Reproductive: Punctate (approximately 1 cm) right-sided presumably physiologic adnexal cyst (coronal image 64, series 5). No discrete left-sided adnexal lesions. There is a small amount of presumably physiologic free fluid in the pelvic cul-de-sac. Other: Regional soft tissues appear normal. Musculoskeletal: No acute or aggressive osseous abnormalities. IMPRESSION: 1. The appendix is of normal size however there is apparent wall hyperemia without evidence of an appendicolith or definitive periappendiceal stranding - while potentially attributable to phase of enhancement, conceivably an early appendicitis could have a similar appearance. Clinical correlation is advised. 2. Suspected punctate (approximately 1 cm) right-sided physiologic adnexal cyst with small amount of free fluid in the pelvic cul-de-sac. 3. Otherwise, no explanation for patient's right lower quadrant abdominal pain.  Specifically, no evidence of enteric or urinary obstruction. 4. Post cholecystectomy. Electronically Signed   By: Simonne Come M.D.   On: 01/16/2020 12:51    Review of Systems  Gastrointestinal: Positive for abdominal pain.  Genitourinary: Negative for dysuria and flank pain.  All other systems reviewed and are negative.   Blood pressure 96/67, pulse 70, temperature 98.1 F (36.7 C), temperature source Oral, resp. rate 15, height  (1.549 m), weight 56.3 kg, last menstrual period 01/11/2020, SpO2 100 %. Physical Exam Constitutional:      Appearance: She is well-developed.  HENT:     Head: Normocephalic and atraumatic.  Cardiovascular:     Rate and Rhythm: Normal rate and regular rhythm.  Pulmonary:     Effort: Pulmonary effort is normal.     Breath sounds: Normal breath sounds. No stridor.  Abdominal:     Palpations: Abdomen is soft.     Tenderness: There is abdominal tenderness in the right lower quadrant. There is no guarding or rebound. Negative signs include McBurney's sign.     Hernia: No hernia is present.  Skin:    General: Skin is warm and dry.  Neurological:     General: No focal deficit present.     Mental Status: She is alert.      Assessment/Plan  Right lower quadrant abdominal pain with equivocal examination and CT scan-I examined the patient and reviewed the  CT scan.  She has no evidence of peritonitis at this point time but is mildly tender in the right lower quadrant.  Her pain has not worsened throughout the day.  She has no nausea or vomiting.  The appendix is prominent but of normal size without stranding.  Given the time course of her presentation, admission for observation and reexamination warranted.  Empiric antibiotics started.  She does not have peritonitis on examination and her abdomen is soft especially the right lower quadrant with no significant rebound or guarding at this point time.  Reexamine in a.m.  If pain worsens or exam worsens, recommend  laparoscopic appendectomy/laparoscopy.  I discussed the plan with the patient she understands and agrees to proceed with admission.  Allow clear liquids for now but make her n.p.o. after midnight.  Continue antibiotics for now.  Of note she does have a right adnexal cyst with fluid in the cul-de-sac which can represent mittelschmerz pain in the circumstance as well. Dortha Schwalbe, MD 01/16/2020, 4:01 PM

## 2020-01-17 ENCOUNTER — Encounter (HOSPITAL_COMMUNITY): Admission: EM | Disposition: A | Discharge status: Home / Self Care | Payer: Self-pay | Source: Home / Self Care

## 2020-01-17 ENCOUNTER — Encounter (HOSPITAL_COMMUNITY): Payer: Self-pay

## 2020-01-17 ENCOUNTER — Observation Stay (HOSPITAL_COMMUNITY): Payer: Medicaid Other | Admitting: Registered Nurse

## 2020-01-17 DIAGNOSIS — R1031 Right lower quadrant pain: Secondary | ICD-10-CM | POA: Diagnosis not present

## 2020-01-17 DIAGNOSIS — Z20822 Contact with and (suspected) exposure to covid-19: Secondary | ICD-10-CM | POA: Diagnosis not present

## 2020-01-17 DIAGNOSIS — Z79899 Other long term (current) drug therapy: Secondary | ICD-10-CM | POA: Diagnosis not present

## 2020-01-17 DIAGNOSIS — E559 Vitamin D deficiency, unspecified: Secondary | ICD-10-CM | POA: Diagnosis not present

## 2020-01-17 DIAGNOSIS — F418 Other specified anxiety disorders: Secondary | ICD-10-CM | POA: Diagnosis not present

## 2020-01-17 DIAGNOSIS — F32A Depression, unspecified: Secondary | ICD-10-CM | POA: Diagnosis not present

## 2020-01-17 DIAGNOSIS — K219 Gastro-esophageal reflux disease without esophagitis: Secondary | ICD-10-CM | POA: Diagnosis not present

## 2020-01-17 DIAGNOSIS — K3580 Unspecified acute appendicitis: Secondary | ICD-10-CM | POA: Diagnosis not present

## 2020-01-17 DIAGNOSIS — Z9049 Acquired absence of other specified parts of digestive tract: Secondary | ICD-10-CM | POA: Diagnosis not present

## 2020-01-17 DIAGNOSIS — Z23 Encounter for immunization: Secondary | ICD-10-CM | POA: Diagnosis not present

## 2020-01-17 DIAGNOSIS — R109 Unspecified abdominal pain: Secondary | ICD-10-CM | POA: Diagnosis not present

## 2020-01-17 DIAGNOSIS — R9431 Abnormal electrocardiogram [ECG] [EKG]: Secondary | ICD-10-CM | POA: Diagnosis not present

## 2020-01-17 DIAGNOSIS — G43909 Migraine, unspecified, not intractable, without status migrainosus: Secondary | ICD-10-CM | POA: Diagnosis not present

## 2020-01-17 DIAGNOSIS — F411 Generalized anxiety disorder: Secondary | ICD-10-CM | POA: Diagnosis not present

## 2020-01-17 DIAGNOSIS — K3589 Other acute appendicitis without perforation or gangrene: Secondary | ICD-10-CM | POA: Diagnosis not present

## 2020-01-17 HISTORY — PX: LAPAROSCOPIC APPENDECTOMY: SHX408

## 2020-01-17 LAB — CBC
HCT: 34 % — ABNORMAL LOW (ref 36.0–46.0)
Hemoglobin: 11.1 g/dL — ABNORMAL LOW (ref 12.0–15.0)
MCH: 29.8 pg (ref 26.0–34.0)
MCHC: 32.6 g/dL (ref 30.0–36.0)
MCV: 91.4 fL (ref 80.0–100.0)
Platelets: 302 10*3/uL (ref 150–400)
RBC: 3.72 MIL/uL — ABNORMAL LOW (ref 3.87–5.11)
RDW: 12.5 % (ref 11.5–15.5)
WBC: 5.8 10*3/uL (ref 4.0–10.5)
nRBC: 0 % (ref 0.0–0.2)

## 2020-01-17 LAB — MRSA PCR SCREENING: MRSA by PCR: NEGATIVE

## 2020-01-17 SURGERY — APPENDECTOMY, LAPAROSCOPIC
Anesthesia: General | Site: Abdomen

## 2020-01-17 MED ORDER — ONDANSETRON HCL 4 MG/2ML IJ SOLN
INTRAMUSCULAR | Status: DC | PRN
  Administered 2020-01-17: 4 mg via INTRAVENOUS

## 2020-01-17 MED ORDER — PROPOFOL 10 MG/ML IV BOLUS
INTRAVENOUS | Status: DC | PRN
  Administered 2020-01-17: 120 mg via INTRAVENOUS

## 2020-01-17 MED ORDER — METRONIDAZOLE IN NACL 5-0.79 MG/ML-% IV SOLN
500.0000 mg | Freq: Three times a day (TID) | INTRAVENOUS | Status: DC
  Administered 2020-01-17 – 2020-01-18 (×3): 500 mg via INTRAVENOUS
  Filled 2020-01-17 (×3): qty 100

## 2020-01-17 MED ORDER — BUPIVACAINE-EPINEPHRINE 0.25% -1:200000 IJ SOLN
INTRAMUSCULAR | Status: DC | PRN
  Administered 2020-01-17: 8 mL

## 2020-01-17 MED ORDER — ONDANSETRON HCL 4 MG/2ML IJ SOLN
INTRAMUSCULAR | Status: AC
  Filled 2020-01-17: qty 2

## 2020-01-17 MED ORDER — ROCURONIUM BROMIDE 10 MG/ML (PF) SYRINGE
PREFILLED_SYRINGE | INTRAVENOUS | Status: DC | PRN
  Administered 2020-01-17: 30 mg via INTRAVENOUS

## 2020-01-17 MED ORDER — MIDAZOLAM HCL 2 MG/2ML IJ SOLN
INTRAMUSCULAR | Status: AC
  Filled 2020-01-17: qty 2

## 2020-01-17 MED ORDER — FENTANYL CITRATE (PF) 100 MCG/2ML IJ SOLN
INTRAMUSCULAR | Status: DC | PRN
  Administered 2020-01-17: 50 ug via INTRAVENOUS
  Administered 2020-01-17 (×2): 100 ug via INTRAVENOUS

## 2020-01-17 MED ORDER — BUPIVACAINE-EPINEPHRINE 0.25% -1:200000 IJ SOLN
INTRAMUSCULAR | Status: AC
  Filled 2020-01-17: qty 1

## 2020-01-17 MED ORDER — DEXAMETHASONE SODIUM PHOSPHATE 10 MG/ML IJ SOLN
INTRAMUSCULAR | Status: AC
  Filled 2020-01-17: qty 1

## 2020-01-17 MED ORDER — HYDROMORPHONE HCL 1 MG/ML IJ SOLN
INTRAMUSCULAR | Status: AC
  Filled 2020-01-17: qty 1

## 2020-01-17 MED ORDER — SODIUM CHLORIDE 0.9 % IV SOLN: 2.0000 g | INTRAVENOUS | Status: AC

## 2020-01-17 MED ORDER — SUGAMMADEX SODIUM 200 MG/2ML IV SOLN
INTRAVENOUS | Status: DC | PRN
  Administered 2020-01-17: 150 mg via INTRAVENOUS

## 2020-01-17 MED ORDER — LIDOCAINE 2% (20 MG/ML) 5 ML SYRINGE
INTRAMUSCULAR | Status: DC | PRN
  Administered 2020-01-17: 100 mg via INTRAVENOUS

## 2020-01-17 MED ORDER — 0.9 % SODIUM CHLORIDE (POUR BTL) OPTIME
TOPICAL | Status: DC | PRN
  Administered 2020-01-17: 1000 mL

## 2020-01-17 MED ORDER — PROPOFOL 10 MG/ML IV BOLUS
INTRAVENOUS | Status: AC
  Filled 2020-01-17: qty 20

## 2020-01-17 MED ORDER — MIDAZOLAM HCL 5 MG/5ML IJ SOLN
INTRAMUSCULAR | Status: DC | PRN
  Administered 2020-01-17: 2 mg via INTRAVENOUS

## 2020-01-17 MED ORDER — SUCCINYLCHOLINE CHLORIDE 200 MG/10ML IV SOSY
PREFILLED_SYRINGE | INTRAVENOUS | Status: DC | PRN
  Administered 2020-01-17: 140 mg via INTRAVENOUS

## 2020-01-17 MED ORDER — OXYCODONE-ACETAMINOPHEN 5-325 MG PO TABS
1.0000 | ORAL_TABLET | ORAL | Status: DC | PRN
  Administered 2020-01-17 – 2020-01-18 (×3): 1 via ORAL
  Filled 2020-01-17 (×3): qty 1

## 2020-01-17 MED ORDER — CHLORHEXIDINE GLUCONATE CLOTH 2 % EX PADS: 6.0000 | MEDICATED_PAD | Freq: Once | CUTANEOUS | Status: DC

## 2020-01-17 MED ORDER — INFLUENZA VAC SPLIT QUAD 0.5 ML IM SUSY
0.5000 mL | PREFILLED_SYRINGE | INTRAMUSCULAR | Status: AC
  Administered 2020-01-18: 0.5 mL via INTRAMUSCULAR
  Filled 2020-01-17: qty 0.5

## 2020-01-17 MED ORDER — ONDANSETRON HCL 4 MG/2ML IJ SOLN: 4.0000 mg | Freq: Once | INTRAMUSCULAR | Status: DC | PRN

## 2020-01-17 MED ORDER — MEPERIDINE HCL 50 MG/ML IJ SOLN: 6.2500 mg | INTRAMUSCULAR | Status: DC | PRN

## 2020-01-17 MED ORDER — LACTATED RINGERS IV SOLN: INTRAVENOUS | Status: DC | PRN

## 2020-01-17 MED ORDER — DEXAMETHASONE SODIUM PHOSPHATE 10 MG/ML IJ SOLN
INTRAMUSCULAR | Status: DC | PRN
  Administered 2020-01-17: 6 mg via INTRAVENOUS

## 2020-01-17 MED ORDER — FENTANYL CITRATE (PF) 250 MCG/5ML IJ SOLN
INTRAMUSCULAR | Status: AC
  Filled 2020-01-17: qty 5

## 2020-01-17 MED ORDER — LACTATED RINGERS IV SOLN
INTRAVENOUS | Status: AC | PRN
  Administered 2020-01-17: 1000 mL

## 2020-01-17 MED ORDER — HYDROMORPHONE HCL 1 MG/ML IJ SOLN
0.2500 mg | INTRAMUSCULAR | Status: DC | PRN
  Administered 2020-01-17: 0.25 mg via INTRAVENOUS

## 2020-01-17 SURGICAL SUPPLY — 38 items
APPLIER CLIP ROT 10 11.4 M/L (STAPLE)
CLIP APPLIE ROT 10 11.4 M/L (STAPLE) IMPLANT
COVER WAND RF STERILE (DRAPES) IMPLANT
CUTTER FLEX LINEAR 45M (STAPLE) IMPLANT
DECANTER SPIKE VIAL GLASS SM (MISCELLANEOUS) ×3 IMPLANT
DERMABOND ADVANCED (GAUZE/BANDAGES/DRESSINGS) ×2
DERMABOND ADVANCED .7 DNX12 (GAUZE/BANDAGES/DRESSINGS) ×1 IMPLANT
DRAPE LAPAROSCOPIC ABDOMINAL (DRAPES) ×1 IMPLANT
DRAPE WARM FLUID 44X44 (DRAPES) ×1 IMPLANT
ELECT REM PT RETURN 15FT ADLT (MISCELLANEOUS) ×3 IMPLANT
ENDOLOOP SUT PDS II  0 18 (SUTURE)
ENDOLOOP SUT PDS II 0 18 (SUTURE) IMPLANT
GLOVE BIOGEL PI IND STRL 7.0 (GLOVE) ×1 IMPLANT
GLOVE BIOGEL PI INDICATOR 7.0 (GLOVE) ×2
GLOVE INDICATOR 8.0 STRL GRN (GLOVE) ×6 IMPLANT
GLOVE SS BIOGEL STRL SZ 8 (GLOVE) ×1 IMPLANT
GLOVE SUPERSENSE BIOGEL SZ 8 (GLOVE) ×2
GOWN STRL REUS W/TWL LRG LVL3 (GOWN DISPOSABLE) ×3 IMPLANT
GOWN STRL REUS W/TWL XL LVL3 (GOWN DISPOSABLE) ×6 IMPLANT
KIT BASIN OR (CUSTOM PROCEDURE TRAY) ×3 IMPLANT
KIT TURNOVER KIT A (KITS) IMPLANT
PENCIL SMOKE EVACUATOR (MISCELLANEOUS) IMPLANT
POUCH SPECIMEN RETRIEVAL 10MM (ENDOMECHANICALS) ×3 IMPLANT
RELOAD 45 VASCULAR/THIN (ENDOMECHANICALS) IMPLANT
RELOAD STAPLE 45 2.5 WHT GRN (ENDOMECHANICALS) IMPLANT
RELOAD STAPLE 45 3.5 BLU ETS (ENDOMECHANICALS) IMPLANT
RELOAD STAPLE TA45 3.5 REG BLU (ENDOMECHANICALS) ×3 IMPLANT
SET IRRIG TUBING LAPAROSCOPIC (IRRIGATION / IRRIGATOR) ×3 IMPLANT
SET TUBE SMOKE EVAC HIGH FLOW (TUBING) ×3 IMPLANT
SHEARS HARMONIC ACE PLUS 36CM (ENDOMECHANICALS) ×2 IMPLANT
SLEEVE XCEL OPT CAN 5 100 (ENDOMECHANICALS) ×3 IMPLANT
SUT MNCRL AB 4-0 PS2 18 (SUTURE) ×3 IMPLANT
TOWEL OR 17X26 10 PK STRL BLUE (TOWEL DISPOSABLE) ×3 IMPLANT
TRAY FOLEY MTR SLVR 16FR STAT (SET/KITS/TRAYS/PACK) ×3 IMPLANT
TRAY LAPAROSCOPIC (CUSTOM PROCEDURE TRAY) ×3 IMPLANT
TROCAR BLADELESS OPT 5 100 (ENDOMECHANICALS) ×3 IMPLANT
TROCAR XCEL 12X100 BLDLESS (ENDOMECHANICALS) ×3 IMPLANT
TROCAR XCEL BLUNT TIP 100MML (ENDOMECHANICALS) ×3 IMPLANT

## 2020-01-17 NOTE — Interval H&P Note (Signed)
History and Physical Interval Note:  01/17/2020 8:34 AM  Tracy Mcconnell  has presented today for surgery, with the diagnosis of appendicitis.  The various methods of treatment have been discussed with the patient and family. After consideration of risks, benefits and other options for treatment, the patient has consented to  Procedure(s): APPENDECTOMY LAPAROSCOPIC (N/A) as a surgical intervention.  The patient's history has been reviewed, patient examined, no change in status, stable for surgery.  I have reviewed the patient's chart and labs.  Questions were answered to the patient's satisfaction.     Raahim Shartzer A Vieva Brummitt

## 2020-01-17 NOTE — Anesthesia Postprocedure Evaluation (Signed)
Anesthesia Post Note  Patient: Tracy Mcconnell  Procedure(s) Performed: APPENDECTOMY LAPAROSCOPIC (N/A Abdomen)     Patient location during evaluation: PACU Anesthesia Type: General Level of consciousness: awake and alert Pain management: pain level controlled Vital Signs Assessment: post-procedure vital signs reviewed and stable Respiratory status: spontaneous breathing, nonlabored ventilation, respiratory function stable and patient connected to nasal cannula oxygen Cardiovascular status: blood pressure returned to baseline and stable Postop Assessment: no apparent nausea or vomiting Anesthetic complications: no   No complications documented.  Last Vitals:  Vitals:   01/17/20 1054 01/17/20 1104  BP: 120/78 112/78  Pulse:  78  Resp:  18  Temp:  36.6 C  SpO2:  98%    Last Pain:  Vitals:   01/17/20 1040  TempSrc:   PainSc: 5                  Shanicqua Coldren DAVID

## 2020-01-17 NOTE — Transfer of Care (Signed)
Immediate Anesthesia Transfer of Care Note  Patient: Tracy Mcconnell  Procedure(s) Performed: APPENDECTOMY LAPAROSCOPIC (N/A Abdomen)  Patient Location: PACU  Anesthesia Type:General  Level of Consciousness: awake, alert , oriented and patient cooperative  Airway & Oxygen Therapy: Patient Spontanous Breathing and Patient connected to face mask oxygen  Post-op Assessment: Report given to RN, Post -op Vital signs reviewed and stable and Patient moving all extremities X 4  Post vital signs: stable  Last Vitals:  Vitals Value Taken Time  BP 116/83 01/17/20 0957  Temp    Pulse 92 01/17/20 0959  Resp 17 01/17/20 0959  SpO2 100 % 01/17/20 0959  Vitals shown include unvalidated device data.  Last Pain:  Vitals:   01/17/20 0507  TempSrc: Oral  PainSc:          Complications: No complications documented.

## 2020-01-17 NOTE — H&P (View-Only) (Signed)
Subjective/Chief Complaint: Abdominal pain Patient states her pain is no better today.  It is about the same as it was yesterday.  No nausea or vomiting.  Location is right lower quadrant has been constant.  It is not worsened but has not improved she states.   Objective: Vital signs in last 24 hours: Temp:  [97.8 F (36.6 C)-98.8 F (37.1 C)] 98.8 F (37.1 C) (10/17 0507) Pulse Rate:  [63-91] 65 (10/17 0507) Resp:  [12-23] 15 (10/17 0507) BP: (89-118)/(59-83) 110/76 (10/17 0507) SpO2:  [93 %-100 %] 100 % (10/17 0507) Weight:  [56.3 kg] 56.3 kg (10/16 0929) Last BM Date: 01/16/20  Intake/Output from previous day: 10/16 0701 - 10/17 0700 In: 894.1 [I.V.:805.1; IV Piggyback:89] Out: -  Intake/Output this shift: No intake/output data recorded.  General appearance: alert and cooperative Cardio: regular rate and rhythm, S1, S2 normal, no murmur, click, rub or gallop GI: Mild tenderness right lower quadrant without rebound or guarding Neurologic: Grossly normal  Lab Results:  Recent Labs    01/16/20 1340 01/17/20 0439  WBC 9.0 5.8  HGB 11.6* 11.1*  HCT 35.0* 34.0*  PLT 310 302   BMET Recent Labs    01/16/20 1009 01/16/20 1340  NA 137 139  K 3.5 3.8  CL 104 105  CO2 24 23  GLUCOSE 103* 85  BUN 7 6  CREATININE 0.75 0.84  CALCIUM 9.0 8.1*   PT/INR No results for input(s): LABPROT, INR in the last 72 hours. ABG No results for input(s): PHART, HCO3 in the last 72 hours.  Invalid input(s): PCO2, PO2  Studies/Results: CT Abdomen Pelvis W Contrast  Result Date: 01/16/2020 CLINICAL DATA:  Acute non localized right lower quadrant abdominal pain. EXAM: CT ABDOMEN AND PELVIS WITH CONTRAST TECHNIQUE: Multidetector CT imaging of the abdomen and pelvis was performed using the standard protocol following bolus administration of intravenous contrast. CONTRAST:  OMNIPAQUE IOHEXOL 300 MG/ML  SOLN COMPARISON:  CT abdomen and pelvis-05/28/2019 FINDINGS: Lower chest:  Limited visualization of the lower thorax demonstrates minimal bibasilar dependent subpleural atelectasis. No discrete focal airspace opacities. Normal heart size.  No pericardial effusion. Hepatobiliary: Normal hepatic contour. No discrete hepatic lesions. Post cholecystectomy. No intra or extrahepatic biliary duct dilatation. No ascites. Pancreas: Normal appearance of the pancreas. Spleen: Normal appearance of the spleen. Adrenals/Urinary Tract: There is symmetric enhancement of the bilateral kidneys. No definite renal stones on this postcontrast examination. No discrete renal lesions. No urinary obstruction or perinephric stranding. Normal appearance the bilateral adrenal glands. Normal appearance of the urinary bladder given degree of distention. Stomach/Bowel: The appendix is of normal size however there is apparent wall hyperemia. No appendicoliths or definitive evidence of periappendiceal stranding. The bowel is otherwise normal in course and caliber. Normal appearance of the terminal ileum. No discrete areas of bowel wall thickening. No pneumoperitoneum, pneumatosis or portal venous gas. Vascular/Lymphatic: Normal caliber of the abdominal aorta. The major branch vessels of the abdominal aorta appear patent on this non CTA examination. No bulky retroperitoneal, mesenteric, pelvic or inguinal lymphadenopathy. Reproductive: Punctate (approximately 1 cm) right-sided presumably physiologic adnexal cyst (coronal image 64, series 5). No discrete left-sided adnexal lesions. There is a small amount of presumably physiologic free fluid in the pelvic cul-de-sac. Other: Regional soft tissues appear normal. Musculoskeletal: No acute or aggressive osseous abnormalities. IMPRESSION: 1. The appendix is of normal size however there is apparent wall hyperemia without evidence of an appendicolith or definitive periappendiceal stranding - while potentially attributable to phase of enhancement, conceivably  an early appendicitis  could have a similar appearance. Clinical correlation is advised. 2. Suspected punctate (approximately 1 cm) right-sided physiologic adnexal cyst with small amount of free fluid in the pelvic cul-de-sac. 3. Otherwise, no explanation for patient's right lower quadrant abdominal pain. Specifically, no evidence of enteric or urinary obstruction. 4. Post cholecystectomy. Electronically Signed   By: John  Watts M.D.   On: 01/16/2020 12:51    Anti-infectives: Anti-infectives (From admission, onward)   Start     Dose/Rate Route Frequency Ordered Stop   01/16/20 1400  piperacillin-tazobactam (ZOSYN) IVPB 3.375 g       Note to Pharmacy: Pharmacy to check dosing   3.375 g 12.5 mL/hr over 240 Minutes Intravenous Every 8 hours 01/16/20 1342     01/16/20 1345  cefTRIAXone (ROCEPHIN) 2 g in sodium chloride 0.9 % 100 mL IVPB       "And" Linked Group Details   2 g 200 mL/hr over 30 Minutes Intravenous  Once 01/16/20 1334 01/16/20 1423   01/16/20 1345  metroNIDAZOLE (FLAGYL) IVPB 500 mg       "And" Linked Group Details   500 mg 100 mL/hr over 60 Minutes Intravenous  Once 01/16/20 1334 01/16/20 1453      Assessment/Plan: Abdominal pain-pain is consistent.  Findings are equivocal for appendicitis but since she is not improved I recommend laparoscopy with possible laparoscopic appendectomy.  Risk reviewed.  Benefits reviewed.  Continued medical management offered.  She is opted for laparoscopic appendectomy.The procedure has been discussed with the patient.  Alternative therapies have been discussed with the patient.  Operative risks include bleeding,  Infection,  Organ injury,  Nerve injury,  Blood vessel injury,  DVT,  Pulmonary embolism,  Death,  And possible reoperation.  Medical management risks include worsening of present situation.  The success of the procedure is 50 -90 % at treating patients symptoms.  The patient understands and agrees to proceed.   LOS: 1 day    Jevonte Clanton A Zhaniya Swallows 01/17/2020 

## 2020-01-17 NOTE — Anesthesia Procedure Notes (Signed)
Procedure Name: Intubation Date/Time: 01/17/2020 9:03 AM Performed by: Lissa Morales, CRNA Pre-anesthesia Checklist: Patient identified, Emergency Drugs available, Suction available and Patient being monitored Patient Re-evaluated:Patient Re-evaluated prior to induction Oxygen Delivery Method: Circle system utilized Preoxygenation: Pre-oxygenation with 100% oxygen Induction Type: IV induction, Rapid sequence and Cricoid Pressure applied Laryngoscope Size: Mac and 4 Grade View: Grade I Tube type: Oral Tube size: 7.0 mm Number of attempts: 1 Airway Equipment and Method: Stylet and Oral airway Placement Confirmation: ETT inserted through vocal cords under direct vision,  positive ETCO2 and breath sounds checked- equal and bilateral Secured at: 21 cm Tube secured with: Tape Dental Injury: Teeth and Oropharynx as per pre-operative assessment

## 2020-01-17 NOTE — Anesthesia Preprocedure Evaluation (Signed)
Anesthesia Evaluation  Patient identified by MRN, date of birth, ID band Patient awake    Reviewed: Allergy & Precautions, NPO status , Patient's Chart, lab work & pertinent test results  Airway Mallampati: I  TM Distance: >3 FB Neck ROM: Full    Dental   Pulmonary    Pulmonary exam normal        Cardiovascular Normal cardiovascular exam     Neuro/Psych Anxiety Depression    GI/Hepatic GERD  Medicated and Controlled,  Endo/Other    Renal/GU      Musculoskeletal   Abdominal   Peds  Hematology   Anesthesia Other Findings   Reproductive/Obstetrics                             Anesthesia Physical Anesthesia Plan  ASA: II and emergent  Anesthesia Plan: General   Post-op Pain Management:    Induction: Intravenous, Rapid sequence and Cricoid pressure planned  PONV Risk Score and Plan: 3 and Ondansetron, Midazolam and Dexamethasone  Airway Management Planned: Oral ETT  Additional Equipment:   Intra-op Plan:   Post-operative Plan: Extubation in OR  Informed Consent: I have reviewed the patients History and Physical, chart, labs and discussed the procedure including the risks, benefits and alternatives for the proposed anesthesia with the patient or authorized representative who has indicated his/her understanding and acceptance.       Plan Discussed with: CRNA and Surgeon  Anesthesia Plan Comments:         Anesthesia Quick Evaluation

## 2020-01-17 NOTE — Progress Notes (Signed)
Subjective/Chief Complaint: Abdominal pain Patient states her pain is no better today.  It is about the same as it was yesterday.  No nausea or vomiting.  Location is right lower quadrant has been constant.  It is not worsened but has not improved she states.   Objective: Vital signs in last 24 hours: Temp:  [97.8 F (36.6 C)-98.8 F (37.1 C)] 98.8 F (37.1 C) (10/17 0507) Pulse Rate:  [63-91] 65 (10/17 0507) Resp:  [12-23] 15 (10/17 0507) BP: (89-118)/(59-83) 110/76 (10/17 0507) SpO2:  [93 %-100 %] 100 % (10/17 0507) Weight:  [56.3 kg] 56.3 kg (10/16 0929) Last BM Date: 01/16/20  Intake/Output from previous day: 10/16 0701 - 10/17 0700 In: 894.1 [I.V.:805.1; IV Piggyback:89] Out: -  Intake/Output this shift: No intake/output data recorded.  General appearance: alert and cooperative Cardio: regular rate and rhythm, S1, S2 normal, no murmur, click, rub or gallop GI: Mild tenderness right lower quadrant without rebound or guarding Neurologic: Grossly normal  Lab Results:  Recent Labs    01/16/20 1340 01/17/20 0439  WBC 9.0 5.8  HGB 11.6* 11.1*  HCT 35.0* 34.0*  PLT 310 302   BMET Recent Labs    01/16/20 1009 01/16/20 1340  NA 137 139  K 3.5 3.8  CL 104 105  CO2 24 23  GLUCOSE 103* 85  BUN 7 6  CREATININE 0.75 0.84  CALCIUM 9.0 8.1*   PT/INR No results for input(s): LABPROT, INR in the last 72 hours. ABG No results for input(s): PHART, HCO3 in the last 72 hours.  Invalid input(s): PCO2, PO2  Studies/Results: CT Abdomen Pelvis W Contrast  Result Date: 01/16/2020 CLINICAL DATA:  Acute non localized right lower quadrant abdominal pain. EXAM: CT ABDOMEN AND PELVIS WITH CONTRAST TECHNIQUE: Multidetector CT imaging of the abdomen and pelvis was performed using the standard protocol following bolus administration of intravenous contrast. CONTRAST:  OMNIPAQUE IOHEXOL 300 MG/ML  SOLN COMPARISON:  CT abdomen and pelvis-05/28/2019 FINDINGS: Lower chest:  Limited visualization of the lower thorax demonstrates minimal bibasilar dependent subpleural atelectasis. No discrete focal airspace opacities. Normal heart size.  No pericardial effusion. Hepatobiliary: Normal hepatic contour. No discrete hepatic lesions. Post cholecystectomy. No intra or extrahepatic biliary duct dilatation. No ascites. Pancreas: Normal appearance of the pancreas. Spleen: Normal appearance of the spleen. Adrenals/Urinary Tract: There is symmetric enhancement of the bilateral kidneys. No definite renal stones on this postcontrast examination. No discrete renal lesions. No urinary obstruction or perinephric stranding. Normal appearance the bilateral adrenal glands. Normal appearance of the urinary bladder given degree of distention. Stomach/Bowel: The appendix is of normal size however there is apparent wall hyperemia. No appendicoliths or definitive evidence of periappendiceal stranding. The bowel is otherwise normal in course and caliber. Normal appearance of the terminal ileum. No discrete areas of bowel wall thickening. No pneumoperitoneum, pneumatosis or portal venous gas. Vascular/Lymphatic: Normal caliber of the abdominal aorta. The major branch vessels of the abdominal aorta appear patent on this non CTA examination. No bulky retroperitoneal, mesenteric, pelvic or inguinal lymphadenopathy. Reproductive: Punctate (approximately 1 cm) right-sided presumably physiologic adnexal cyst (coronal image 64, series 5). No discrete left-sided adnexal lesions. There is a small amount of presumably physiologic free fluid in the pelvic cul-de-sac. Other: Regional soft tissues appear normal. Musculoskeletal: No acute or aggressive osseous abnormalities. IMPRESSION: 1. The appendix is of normal size however there is apparent wall hyperemia without evidence of an appendicolith or definitive periappendiceal stranding - while potentially attributable to phase of enhancement, conceivably  an early appendicitis  could have a similar appearance. Clinical correlation is advised. 2. Suspected punctate (approximately 1 cm) right-sided physiologic adnexal cyst with small amount of free fluid in the pelvic cul-de-sac. 3. Otherwise, no explanation for patient's right lower quadrant abdominal pain. Specifically, no evidence of enteric or urinary obstruction. 4. Post cholecystectomy. Electronically Signed   By: Simonne Come M.D.   On: 01/16/2020 12:51    Anti-infectives: Anti-infectives (From admission, onward)   Start     Dose/Rate Route Frequency Ordered Stop   01/16/20 1400  piperacillin-tazobactam (ZOSYN) IVPB 3.375 g       Note to Pharmacy: Pharmacy to check dosing   3.375 g 12.5 mL/hr over 240 Minutes Intravenous Every 8 hours 01/16/20 1342     01/16/20 1345  cefTRIAXone (ROCEPHIN) 2 g in sodium chloride 0.9 % 100 mL IVPB       "And" Linked Group Details   2 g 200 mL/hr over 30 Minutes Intravenous  Once 01/16/20 1334 01/16/20 1423   01/16/20 1345  metroNIDAZOLE (FLAGYL) IVPB 500 mg       "And" Linked Group Details   500 mg 100 mL/hr over 60 Minutes Intravenous  Once 01/16/20 1334 01/16/20 1453      Assessment/Plan: Abdominal pain-pain is consistent.  Findings are equivocal for appendicitis but since she is not improved I recommend laparoscopy with possible laparoscopic appendectomy.  Risk reviewed.  Benefits reviewed.  Continued medical management offered.  She is opted for laparoscopic appendectomy.The procedure has been discussed with the patient.  Alternative therapies have been discussed with the patient.  Operative risks include bleeding,  Infection,  Organ injury,  Nerve injury,  Blood vessel injury,  DVT,  Pulmonary embolism,  Death,  And possible reoperation.  Medical management risks include worsening of present situation.  The success of the procedure is 50 -90 % at treating patients symptoms.  The patient understands and agrees to proceed.   LOS: 1 day    Clovis Pu Quintara Bost 01/17/2020

## 2020-01-17 NOTE — ED Notes (Signed)
Pt ambulated to restroom without any difficulty. Pt instructed of NPO status which includes no food or drink after midnight. Pt acknowledged NPO status.

## 2020-01-17 NOTE — Op Note (Addendum)
Appendectomy, Lap, Procedure Note  Indications: The patient presented with a history of right-sided abdominal pain.  Work-up included a CT scan, CBC and examination.  She had some questionable hyperenhancement of the tip of the appendix on yesterday's CT scan.  She was evaluated and her symptoms were not clear-cut for appendicitis.  She was admitted for medical management and observation.  She was reexamined this morning and said her pain was the same.  I reviewed laparoscopy to further evaluate her and/or treat her underlying condition.  I discussed appendectomy as well in the same circumstance.  Risks and benefits were reviewed as well as discussion of continued medical management and the risks and benefits of that.  She opted undergo laparoscopy with laparoscopic appendectomy.The procedure has been discussed with the patient.  Alternative therapies have been discussed with the patient.  Operative risks include bleeding,  Infection,  Organ injury,  Nerve injury,  Blood vessel injury, intra-abdominal bowel injury reoperation DVT,  Pulmonary embolism,  Death,  And possible reoperation.  Medical management risks include worsening of present situation.  The success of the procedure is 50 -90 % at treating patients symptoms.  The patient understands and agrees to proceed.  Pre-operative Diagnosis: Abdominal pain, right lower quadrant  Post-operative Diagnosis: Acute appendicitis without mention of peritonitis  Surgeon: Dortha Schwalbe  MD   Assistants: None  Anesthesia: General endotracheal anesthesia and Local anesthesia 0.25.% bupivacaine, with epinephrine  ASA Class: 1  Procedure Details  The patient was seen again in the Holding Room. The risks, benefits, complications, treatment options, and expected outcomes were discussed with the patient and/or family. The possibilities of reaction to medication, pulmonary aspiration, perforation of viscus, bleeding, recurrent infection, finding a normal  appendix, the need for additional procedures, failure to diagnose a condition, and creating a complication requiring transfusion or operation were discussed. There was concurrence with the proposed plan and informed consent was obtained. The site of surgery was properly noted/marked. The patient was taken to Operating Room, identified as Tracy Mcconnell and the procedure verified as Appendectomy. A Time Out was held and the above information confirmed.  The patient was placed in the supine position and general anesthesia was induced, along with placement of orogastric tube, Venodyne boots, and a Foley catheter. The abdomen was prepped and draped in a sterile fashion. A one centimeter infraumbilical incision was made and the peritoneal cavity was accessed using the OPEN  technique. The pneumoperitoneum was then established to steady pressure of 12 mmHg. A 12 mm port was placed through the umbilical incision. Additional 5 mm cannulas then placed in the left lower quadrant of the abdomen and right upper quadrant under direct vision. A careful evaluation of the entire abdomen was carried out. The patient was placed in Trendelenburg and left lateral decubitus position. The small intestines were retracted in the cephalad and left lateral direction away from the pelvis and right lower quadrant. The patient was found to have an enlarged and inflamed appendix that was extending into the pelvis. There was no evidence of perforation.  The appendix was carefully dissected. A window was made in the mesoappendix at the base of the appendix. A harmonic scalpel was used across the mesoappendix. The appendix was divided at its base using an endo-GIA stapler. Minimal appendiceal stump was left in place. There was no evidence of bleeding, leakage, or complication after division of the appendix. Irrigation was also performed and irrigate suctioned from the abdomen as well.  Four-quadrant laparoscopy performed.  The  right  ovary was normal.  There is a 1 cm right adnexal cyst that had not ruptured.  Left ovary and tube were normal.  Uterus normal.  Small bowel appeared normal without signs of inflammation ascending colon, transverse colon, descending colon sigmoid colon and rectum were grossly normal.  Liver was normal.  Gallbladder was absent.  Stomach was normal.  Bladder was moderately distended but normal.  The umbilical port site was closed using 0 vicryl pursestring sutures fashion at the level of the fascia. The trocar site skin wounds were closed with 4-0 Monocryl..  Instrument, sponge, and needle counts were correct at the conclusion of the case.   Findings: The appendix was found to be inflamed. There were not signs of necrosis.  There was not perforation. There was not abscess formation.  Estimated Blood Loss:  Minimal         Drains: None         Total IV Fluids: Per anesthesia record         Specimens: Appendix         Complications:  None; patient tolerated the procedure well.         Disposition: PACU - hemodynamically stable.         Condition: stable

## 2020-01-18 ENCOUNTER — Encounter: Payer: Self-pay | Admitting: Registered Nurse

## 2020-01-18 ENCOUNTER — Encounter (HOSPITAL_COMMUNITY): Payer: Self-pay | Admitting: Surgery

## 2020-01-18 MED ORDER — OXYCODONE HCL 5 MG PO TABS
5.0000 mg | ORAL_TABLET | Freq: Four times a day (QID) | ORAL | 0 refills | Status: DC | PRN
Start: 2020-01-18 — End: 2020-03-02

## 2020-01-18 MED ORDER — ACETAMINOPHEN 325 MG PO TABS
650.0000 mg | ORAL_TABLET | Freq: Four times a day (QID) | ORAL | Status: DC | PRN
Start: 2020-01-18 — End: 2022-02-06

## 2020-01-18 MED ORDER — POLYETHYLENE GLYCOL 3350 17 G PO PACK: 17.0000 g | PACK | Freq: Every day | ORAL | 0 refills | Status: DC | PRN

## 2020-01-18 NOTE — Telephone Encounter (Signed)
Pt would like celiac testing due to symptoms of gluten intolerance  Informed pt make nurse only visit can you order appropriate labs?

## 2020-01-18 NOTE — Plan of Care (Signed)
Instructions were reviewed with patient. All questions were answered. Patient was transported to main entrance by wheelchair. ° °

## 2020-01-18 NOTE — Discharge Summary (Signed)
[facility] Discharge Summary   Patient ID: Tracy Mcconnell MRN: [id number] DOB/AGE: [birth year] 24 y.o.  Admit date: 01/16/2020 Discharge date: 01/18/2020  Admitting Diagnosis: Acute appendicitis  Discharge Diagnosis Patient Active Problem List   Diagnosis Date Noted  . Acute appendicitis 01/17/2020  . Abdominal pain 01/16/2020  . Left lumbar radiculitis 07/24/2019  . Vitamin D deficiency 07/15/2019  . 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 Numbness 06/25/2019  . Blurry vision   . Generalized anxiety disorder   . Optic neuritis 06/10/2019  . Hypovitaminosis D 04/07/2019  . Migraines 10/28/2018  . Lumbar radiculopathy 10/28/2018  . Chronic daily headache 02/04/2017    Consultants None  Imaging: CT Abdomen Pelvis W Contrast  Result Date: 01/16/2020 CLINICAL DATA:  Acute non localized right lower quadrant abdominal pain. EXAM: CT ABDOMEN AND PELVIS WITH CONTRAST TECHNIQUE: Multidetector CT imaging of the abdomen and pelvis was performed using the standard protocol following bolus administration of intravenous contrast. CONTRAST:  OMNIPAQUE IOHEXOL 300 MG/ML  SOLN COMPARISON:  CT abdomen and pelvis-05/28/2019 FINDINGS: Lower chest: Limited visualization of the lower thorax demonstrates minimal bibasilar dependent subpleural atelectasis. No discrete focal airspace opacities. Normal heart size.  No pericardial effusion. Hepatobiliary: Normal hepatic contour. No discrete hepatic lesions. Post cholecystectomy. No intra or extrahepatic biliary duct dilatation. No ascites. Pancreas: Normal appearance of the pancreas. Spleen: Normal appearance of the spleen. Adrenals/Urinary Tract: There is symmetric enhancement of the bilateral kidneys. No definite renal stones on this postcontrast examination. No discrete renal lesions. No urinary obstruction or perinephric stranding. Normal appearance the bilateral adrenal glands. Normal appearance of the urinary  bladder given degree of distention. Stomach/Bowel: The appendix is of normal size however there is apparent wall hyperemia. No appendicoliths or definitive evidence of periappendiceal stranding. The bowel is otherwise normal in course and caliber. Normal appearance of the terminal ileum. No discrete areas of bowel wall thickening. No pneumoperitoneum, pneumatosis or portal venous gas. Vascular/Lymphatic: Normal caliber of the abdominal aorta. The major branch vessels of the abdominal aorta appear patent on this non CTA examination. No bulky retroperitoneal, mesenteric, pelvic or inguinal lymphadenopathy. Reproductive: Punctate (approximately 1 cm) right-sided presumably physiologic adnexal cyst (coronal image 64, series 5). No discrete left-sided adnexal lesions. There is a small amount of presumably physiologic free fluid in the pelvic cul-de-sac. Other: Regional soft tissues appear normal. Musculoskeletal: No acute or aggressive osseous abnormalities. IMPRESSION: 1. The appendix is of normal size however there is apparent wall hyperemia without evidence of an appendicolith or definitive periappendiceal stranding - while potentially attributable to phase of enhancement, conceivably an early appendicitis could have a similar appearance. Clinical correlation is advised. 2. Suspected punctate (approximately 1 cm) right-sided physiologic adnexal cyst with small amount of free fluid in the pelvic cul-de-sac. 3. Otherwise, no explanation for patient's right lower quadrant abdominal pain. Specifically, no evidence of enteric or urinary obstruction. 4. Post cholecystectomy. Electronically Signed   By: Simonne Come M.D.   On: 01/16/2020 12:51    Procedures Dr. Luisa Hart (01/17/2020) - Laparoscopic Appendectomy  Hospital Course:  [name] is a 24yo female who presented to [facility] 10/16 with acute onset RLQ pain.  Workup showed possible acute appendicitis.  Patient was admitted and underwent procedure listed  above.  Tolerated procedure well and was transferred to the floor.  Diet was advanced as tolerated.  On POD1, the patient was voiding well, tolerating diet, ambulating well, pain well controlled, vital signs stable, incisions c/d/i and felt stable for discharge  home.  Patient will follow up as below and knows to call with questions or concerns.    I have personally reviewed the patients medication history on the [place] controlled substance database.    Physical Exam: General:  Alert, NAD, pleasant, comfortable Pulm: rate and effort normal Abd:  Soft, ND, appropriately tender, multiple lap incisions C/D/I, +BS Skin: warm and dry  Allergies as of 01/18/2020      Reactions   Sumatriptan Shortness Of Breath, Other (See Comments)   Entire body "felt heavy"   Other Itching, Swelling, Other (See Comments)   Pesticides (family history)- Exposure face and mouth itch and the tongue/lips swell      Medication List    STOP taking these medications   rizatriptan 10 MG disintegrating tablet Commonly known as: MAXALT-MLT     TAKE these medications   acetaminophen 325 MG tablet Commonly known as: TYLENOL Take 2 tablets (650 mg total) by mouth every 6 (six) hours as needed for mild pain (or temp > 100).   Botox 200 units Solr Generic drug: Botulinum Toxin Type A Inject 155 units intramuscularly into head and neck muscles every 3 months by provider. What changed:   how much to take  how to take this  when to take this   buPROPion 300 MG 24 hr tablet Commonly known as: WELLBUTRIN XL Take 1 tablet (300 mg total) by mouth in the morning.   busPIRone 30 MG tablet Commonly known as: BUSPAR Take 30 mg by mouth 2 (two) times daily.   cyclobenzaprine 10 MG tablet Commonly known as: FLEXERIL Take 1 tablet (10 mg total) by mouth 3 (three) times daily as needed for muscle spasms.   ergocalciferol 1.25 MG (50000 UT) capsule Commonly known as: VITAMIN D2 Take 50,000 Units by mouth every  Friday.   FLUoxetine 40 MG capsule Commonly known as: PROZAC Take 1 capsule (40 mg total) by mouth in the morning.   gabapentin 600 MG tablet Commonly known as: Neurontin Take 1 tablet (600 mg total) by mouth in the morning, at noon, in the evening, and at bedtime.   LAVENDER OIL PO Take 1 capsule by mouth daily.   lidocaine 2 % solution Commonly known as: XYLOCAINE Use as directed 15 mLs in the mouth or throat every 4 (four) hours as needed for mouth pain (mix with warm salt water, gargle and spit as needed for throat pain). What changed:   reasons to take this  additional instructions   LORazepam 1 MG tablet Commonly known as: ATIVAN Take 1 tablet (1 mg total) by mouth every 6 (six) hours as needed for anxiety.   magnesium oxide 400 MG tablet Commonly known as: MAG-OX Take 400 mg by mouth daily.   naproxen sodium 220 MG tablet Commonly known as: ALEVE Take 220 mg by mouth 2 (two) times daily as needed (pain).   Nurtec 75 MG Tbdp Generic drug: Rimegepant Sulfate Take 75 mg by mouth daily as needed (take for abortive therapy of migraine, no more than 1 tablet in 24 hours or 10 per month). What changed: reasons to take this   omeprazole 20 MG capsule Commonly known as: PRILOSEC Take 1 capsule (20 mg total) by mouth daily.   oxyCODONE 5 MG immediate release tablet Commonly known as: Roxicodone Take 1 tablet (5 mg total) by mouth every 6 (six) hours as needed for severe pain. What changed: when to take this   polyethylene glycol 17 g packet Commonly known as: MiraLax Take 17 g  by mouth daily as needed for mild constipation.   promethazine-dextromethorphan 6.25-15 MG/5ML syrup Commonly known as: PROMETHAZINE-DM Take 5 mLs by mouth 3 (three) times daily as needed for cough.   QUEtiapine 50 MG tablet Commonly known as: SEROquel 1/2 to 1 tab qhs for sleep What changed:   how much to take  how to take this  when to take this  additional instructions           Signed: Franne Forts, [facility] 01/18/2020, 9:21 AM Please see Amion for pager number during day hours 7:00am-4:30pm

## 2020-01-19 LAB — SURGICAL PATHOLOGY

## 2020-01-25 ENCOUNTER — Telehealth: Payer: Self-pay | Admitting: Emergency Medicine

## 2020-01-25 ENCOUNTER — Other Ambulatory Visit: Payer: Self-pay | Admitting: Emergency Medicine

## 2020-01-25 ENCOUNTER — Other Ambulatory Visit: Payer: Self-pay | Admitting: Registered Nurse

## 2020-01-25 DIAGNOSIS — G8929 Other chronic pain: Secondary | ICD-10-CM

## 2020-01-25 NOTE — Telephone Encounter (Signed)
We can schedule a lab only visit, that would be fine  Thanks,  Jari Sportsman, NP

## 2020-01-25 NOTE — Telephone Encounter (Signed)
Spoke with patient. Patient was informed that Luan Pulling will drop the orders for celiac testing. Patient will come by the office tomorrow for a lab order visit. Patient was informed to fast due to I was not sure for this test.

## 2020-01-27 NOTE — Progress Notes (Signed)
PA submitted for Nurtec 75mg  on Cover my Meds. There is a 24/72hr turn around. Key: [id number] Status: PENDING   [facility] is processing your PA request and will respond shortly with next steps. You may close this dialog, return to your dashboard, and perform other tasks. To check for an update later, open this request again from your dashboard.  If you need assistance, please chat with CoverMyMeds or call at [phone].

## 2020-01-28 ENCOUNTER — Telehealth: Payer: Self-pay | Admitting: Neurology

## 2020-01-28 ENCOUNTER — Encounter: Payer: Self-pay | Admitting: Registered Nurse

## 2020-01-28 NOTE — Progress Notes (Signed)
Established Patient Office Visit  Subjective:  Patient ID: Tracy Mcconnell, female    DOB: [birth year]  Age: 24 y.o. MRN: [id number]  CC:  Chief Complaint  Patient presents with  . Follow-up    HPI Tracy Mcconnell presents for follow up  Feeling well with fluoxetine, hopes to continue Denies HI/SI No acute concerns  Histories reviewed with patient, updated as relevant  Past Medical History:  Diagnosis Date  . Anemia   . Anxiety   . Depression   . GERD (gastroesophageal reflux disease)   . Hypovitaminosis D 04/07/2019  . Left eye complaint 06/08/2019  . Migraines     Past Surgical History:  Procedure Laterality Date  . CHOLECYSTECTOMY N/A 10/19/2019   Procedure: LAPAROSCOPIC CHOLECYSTECTOMY;  Surgeon: Berna Bue, MD;  Location: [facility];  Service: General;  Laterality: N/A;  . LAPAROSCOPIC APPENDECTOMY N/A 01/17/2020   Procedure: APPENDECTOMY LAPAROSCOPIC;  Surgeon: Harriette Bouillon, MD;  Location: [facility];  Service: General;  Laterality: N/A;    Family History  Problem Relation Age of Onset  . Migraines Mother   . Thyroid disease Mother   . Cancer Maternal Grandmother   . Diabetes Paternal Grandfather   . Hypertension Maternal Grandfather   . Diabetes Paternal Grandmother   . Stroke Paternal Grandmother     Social History   Socioeconomic History  . Marital status: Significant Other    Spouse name: Not on file  . Number of children: 0  . Years of education: college student  . Highest education level: Not on file  Occupational History  . Not on file  Tobacco Use  . Smoking status: Never Smoker  . Smokeless tobacco: Never Used  Vaping Use  . Vaping Use: Never used  Substance and Sexual Activity  . Alcohol use: No  . Drug use: No  . Sexual activity: Not Currently  Other Topics Concern  . Not on file  Social History Narrative   Lives at home alone   Right handed   Does not drink caffeine, makes her nauseated   Social Determinants  of Health   Financial Resource Strain:   . Difficulty of Paying Living Expenses: Not on file  Food Insecurity:   . Worried About Programme researcher, broadcasting/film/video in the Last Year: Not on file  . Ran Out of Food in the Last Year: Not on file  Transportation Needs:   . Lack of Transportation (Medical): Not on file  . Lack of Transportation (Non-Medical): Not on file  Physical Activity:   . Days of Exercise per Week: Not on file  . Minutes of Exercise per Session: Not on file  Stress:   . Feeling of Stress : Not on file  Social Connections:   . Frequency of Communication with Friends and Family: Not on file  . Frequency of Social Gatherings with Friends and Family: Not on file  . Attends Religious Services: Not on file  . Active Member of Clubs or Organizations: Not on file  . Attends Banker Meetings: Not on file  . Marital Status: Not on file  Intimate Partner Violence:   . Fear of Current or Ex-Partner: Not on file  . Emotionally Abused: Not on file  . Physically Abused: Not on file  . Sexually Abused: Not on file    Outpatient Medications Prior to Visit  Medication Sig Dispense Refill  . busPIRone (BUSPAR) 30 MG tablet Take 30 mg by mouth 2 (two) times daily.    [name]  docusate sodium (COLACE) 100 MG capsule Take 1 capsule (100 mg total) by mouth 2 (two) times daily. Okay to decrease to once daily or stop taking if having loose bowel movements 30 capsule 0  . ergocalciferol (VITAMIN D2) 1.25 MG (50000 UT) capsule Take 50,000 Units by mouth every Friday.     [name] LAVENDER OIL PO Take 1 capsule by mouth daily.    . magnesium oxide (MAG-OX) 400 MG tablet Take 400 mg by mouth daily.    . naproxen sodium (ALEVE) 220 MG tablet Take 220 mg by mouth 2 (two) times daily as needed (pain).     [name] omeprazole (PRILOSEC) 20 MG capsule Take 1 capsule (20 mg total) by mouth daily. 30 capsule 3  . baclofen (LIORESAL) 20 MG tablet Take 20 mg by mouth 3 (three) times daily as needed for muscle spasms.       [name] LORazepam (ATIVAN) 1 MG tablet Take 1 mg by mouth every 6 (six) hours as needed for anxiety.    . OnabotulinumtoxinA (BOTOX IJ) Inject 1 Dose as directed every 3 (three) months.    [name] oxyCODONE (ROXICODONE) 5 MG immediate release tablet Take 1 tablet (5 mg total) by mouth every 4 (four) hours as needed for severe pain. (Patient not taking: Reported on 01/16/2020) 12 tablet 0  . QUEtiapine (SEROQUEL) 50 MG tablet 1/2 to 1 tab qhs for sleep (Patient taking differently: Take 50 mg by mouth at bedtime. ) 30 tablet 0  . rizatriptan (MAXALT-MLT) 10 MG disintegrating tablet Take 1 tablet (10 mg total) by mouth as needed for migraine (and may repeat once in 2 hours, if no relief (DISSOLVE IN THE MOUTH)). (Patient not taking: Reported on 01/16/2020) 11 tablet 5  . traMADol (ULTRAM) 50 MG tablet Take 1 tablet (50 mg total) by mouth every 6 (six) hours as needed (pain not relieved by tylenol or ibuprofen). (Patient not taking: Reported on 12/28/2019) 10 tablet 0  . gabapentin (NEURONTIN) 600 MG tablet Take 1 tablet (600 mg total) by mouth in the morning, at noon, in the evening, and at bedtime. 360 tablet 0  . buPROPion (WELLBUTRIN XL) 300 MG 24 hr tablet Take 1 tablet (300 mg total) by mouth in the morning. 30 tablet 1  . FLUoxetine (PROZAC) 40 MG capsule Take 1 capsule (40 mg total) by mouth in the morning. 30 capsule 1   No facility-administered medications prior to visit.    Allergies  Allergen Reactions  . Sumatriptan Shortness Of Breath and Other (See Comments)    Entire body "felt heavy"  . Other Itching, Swelling and Other (See Comments)    Pesticides (family history)- Exposure face and mouth itch and the tongue/lips swell    ROS Review of Systems  Constitutional: Negative.   HENT: Negative.   Eyes: Negative.   Respiratory: Negative.   Cardiovascular: Negative.   Gastrointestinal: Negative.   Genitourinary: Negative.   Musculoskeletal: Negative.   Skin: Negative.   Neurological:  Negative.   Psychiatric/Behavioral: Negative.       Objective:    Physical Exam Vitals and nursing note reviewed.  Constitutional:      General: She is not in acute distress.    Appearance: Normal appearance. She is normal weight. She is not ill-appearing, toxic-appearing or diaphoretic.  Cardiovascular:     Rate and Rhythm: Normal rate and regular rhythm.     Heart sounds: Normal heart sounds. No murmur heard.  No friction rub. No gallop.   Pulmonary:  Effort: Pulmonary effort is normal. No respiratory distress.     Breath sounds: Normal breath sounds. No stridor. No wheezing, rhonchi or rales.  Chest:     Chest wall: No tenderness.  Skin:    General: Skin is warm and dry.  Neurological:     General: No focal deficit present.     Mental Status: She is alert and oriented to person, place, and time. Mental status is at baseline.  Psychiatric:        Mood and Affect: Mood normal.        Behavior: Behavior normal.        Thought Content: Thought content normal.        Judgment: Judgment normal.     BP 114/77 (BP Location: Right Arm, Patient Position: Sitting, Cuff Size: Normal)   Pulse 74   Temp 97.6 F (36.4 C) (Temporal)   Ht 5\' 1"  (1.549 m)   Wt 125 lb (56.7 kg)   LMP 11/08/2019   SpO2 94%   BMI 23.62 kg/m  Wt Readings from Last 3 Encounters:  01/16/20 124 lb 3.2 oz (56.3 kg)  12/28/19 124 lb 3.2 oz (56.3 kg)  12/16/19 130 lb (59 kg)     There are no preventive care reminders to display for this patient.  There are no preventive care reminders to display for this patient.  Lab Results  Component Value Date   TSH 0.494 12/28/2019   Lab Results  Component Value Date   WBC 5.8 01/17/2020   HGB 11.1 (L) 01/17/2020   HCT 34.0 (L) 01/17/2020   MCV 91.4 01/17/2020   PLT 302 01/17/2020   Lab Results  Component Value Date   NA 139 01/16/2020   K 3.8 01/16/2020   CO2 23 01/16/2020   GLUCOSE 85 01/16/2020   BUN 6 01/16/2020   CREATININE 0.84  01/16/2020   BILITOT 0.5 01/16/2020   ALKPHOS 79 01/16/2020   AST 21 01/16/2020   ALT 16 01/16/2020   PROT 7.3 01/16/2020   ALBUMIN 4.1 01/16/2020   CALCIUM 8.1 (L) 01/16/2020   ANIONGAP 11 01/16/2020   No results found for: CHOL No results found for: HDL No results found for: LDLCALC No results found for: TRIG No results found for: CHOLHDL Lab Results  Component Value Date   HGBA1C 5.2 12/28/2019      Assessment & Plan:   Problem List Items Addressed This Visit      Other   Generalized anxiety disorder - Primary   Relevant Medications   FLUoxetine (PROZAC) 40 MG capsule      Meds ordered this encounter  Medications  . FLUoxetine (PROZAC) 40 MG capsule    Sig: Take 1 capsule (40 mg total) by mouth in the morning.    Dispense:  90 capsule    Refill:  3    Follow-up: No follow-ups on file.   PLAN  Refill fluoxetine x 1 year  Return in 1 year or as needed  Patient encouraged to call clinic with any questions, comments, or concerns.  12/30/2019, NP

## 2020-01-28 NOTE — Telephone Encounter (Signed)
Phone rep worked Building services engineer @ [employer] for [employer] is asking to be called at [phone] for assistance in keeping pt in the program as it relates to the cost being covered.

## 2020-01-29 NOTE — Telephone Encounter (Signed)
LM on he VM for a call back

## 2020-02-01 DIAGNOSIS — Z419 Encounter for procedure for purposes other than remedying health state, unspecified: Secondary | ICD-10-CM | POA: Diagnosis not present

## 2020-02-03 NOTE — Progress Notes (Signed)
OUTCOME: Message from Plan Member Not Found  Will attempt PA again

## 2020-02-04 ENCOUNTER — Other Ambulatory Visit: Payer: Self-pay

## 2020-02-04 ENCOUNTER — Ambulatory Visit: Payer: Medicaid Other

## 2020-02-04 DIAGNOSIS — R109 Unspecified abdominal pain: Secondary | ICD-10-CM | POA: Diagnosis not present

## 2020-02-04 DIAGNOSIS — G8929 Other chronic pain: Secondary | ICD-10-CM | POA: Diagnosis not present

## 2020-02-09 LAB — NON CELIAC GLUTEN SENS SCREEN: Antigliadin IgG (native): 13 units (ref 0–19)

## 2020-02-09 LAB — BOWEL DISORDERS CASCADE: tTG/DGP Screen: NEGATIVE

## 2020-02-09 LAB — INFLAMMATORY BOWEL DISEASE SCR
Atypical pANCA: NEGATIVE
Saccharomyces cerevisiae, IgG: <20 Units (ref 0.0–24.9)

## 2020-02-09 LAB — NOTE:

## 2020-02-09 NOTE — Telephone Encounter (Signed)
Tracy Mcconnell, this was routed to me by accident. I see you were the one working on this. Thank you

## 2020-02-09 NOTE — Progress Notes (Signed)
PA submitted for Nurtec 75mg  on Cover my Meds. There is a 24/72hr turn around. Key: [id number] Status: PENDING   [facility] is processing your PA request and will respond shortly with next steps. You may close this dialog, return to your dashboard, and perform other tasks. To check for an update later, open this request again from your dashboard.  If you need assistance, please chat with CoverMyMeds or call at [phone].

## 2020-02-09 NOTE — Telephone Encounter (Signed)
[employer]ur[employer] (Tracy Mcconnell) called, follow up on status of PA status, approved or denied. Contact information: [phone] Ext [phone].

## 2020-02-11 ENCOUNTER — Encounter: Payer: Self-pay | Admitting: Registered Nurse

## 2020-02-15 ENCOUNTER — Telehealth: Payer: Medicaid Other | Admitting: Family

## 2020-02-15 DIAGNOSIS — M545 Low back pain, unspecified: Secondary | ICD-10-CM

## 2020-02-15 MED ORDER — BACLOFEN 10 MG PO TABS: 10.0000 mg | ORAL_TABLET | Freq: Three times a day (TID) | ORAL | 0 refills | Status: DC

## 2020-02-15 MED ORDER — NAPROXEN 500 MG PO TABS: 500.0000 mg | ORAL_TABLET | Freq: Two times a day (BID) | ORAL | 0 refills | Status: DC

## 2020-02-15 NOTE — Progress Notes (Signed)

## 2020-02-19 ENCOUNTER — Ambulatory Visit: Payer: Medicaid Other | Admitting: Registered Nurse

## 2020-02-22 ENCOUNTER — Encounter: Payer: Self-pay | Admitting: Registered Nurse

## 2020-03-01 ENCOUNTER — Telehealth: Payer: Medicaid Other | Admitting: Emergency Medicine

## 2020-03-01 DIAGNOSIS — G8929 Other chronic pain: Secondary | ICD-10-CM

## 2020-03-01 DIAGNOSIS — M545 Low back pain, unspecified: Secondary | ICD-10-CM

## 2020-03-01 NOTE — Progress Notes (Signed)
Time spent: 10 min  Based on what you shared with me, I feel your condition warrants further evaluation and I recommend that you be seen for a face to face office visit.  You mentioned several red flags like numbness, abnormal sensation in both legs, severe pain, "popping" with movement.  These symptoms are out of scope of E-visit appointments. Please seek in person appointment with your primary care doctor, orthopedist or urgent care.   Go to the emergency room if you develop sudden loss of bladder or bowel control, numbness (no feeling) in your groin, extremities or paralysis (weakness) in your extremities, urinary tract infection symptoms or fevers.    NOTE: If you entered your credit card information for this eVisit, you will not be charged. You may see a "hold" on your card for the $35 but that hold will drop off and you will not have a charge processed.   If you are having a true medical emergency please call 911.      For an urgent face to face visit, Tracy Mcconnell has five urgent care centers for your convenience:     [facility] at [facility] Directions [phone] [address] [place], [place] [address] . 10 am - 6pm Monday - Friday    [facility]) Get Driving Directions [phone] [address] [place], [place] [address] . 10 am to 8 pm Monday-Friday . 12 pm to 8 pm [facility] at [facility] Get Driving Directions [phone] [address] [place], [place] [address] . 8 am to 8 pm Monday-Friday . 9 am to 6 pm Saturday . 11 am to 6 pm Sunday     [facility] at [facility] Get Driving Directions  [phone] [address] [place], [place] [address] . 8 am to 8 pm Monday-Friday . 8 am to 4 pm [facility] Urgent Care at [facility] Directions [phone] [address] [place], [place] [address] . 12 pm to 6 pm Monday-Friday      Your e-visit answers were reviewed by a board certified advanced clinical practitioner to complete your personal care plan.  Thank you for using e-Visits.

## 2020-03-02 ENCOUNTER — Ambulatory Visit (HOSPITAL_COMMUNITY)
Admission: EM | Admit: 2020-03-02 | Discharge: 2020-03-02 | Disposition: A | Discharge status: Home / Self Care | Payer: Medicaid Other | Attending: Family Medicine | Admitting: Family Medicine

## 2020-03-02 ENCOUNTER — Encounter (HOSPITAL_COMMUNITY): Payer: Self-pay

## 2020-03-02 ENCOUNTER — Ambulatory Visit (INDEPENDENT_AMBULATORY_CARE_PROVIDER_SITE_OTHER): Payer: Medicaid Other

## 2020-03-02 ENCOUNTER — Other Ambulatory Visit: Payer: Self-pay

## 2020-03-02 DIAGNOSIS — M5431 Sciatica, right side: Secondary | ICD-10-CM | POA: Diagnosis not present

## 2020-03-02 DIAGNOSIS — M545 Low back pain, unspecified: Secondary | ICD-10-CM | POA: Diagnosis not present

## 2020-03-02 DIAGNOSIS — Z419 Encounter for procedure for purposes other than remedying health state, unspecified: Secondary | ICD-10-CM | POA: Diagnosis not present

## 2020-03-02 LAB — POC URINE PREG, ED: Preg Test, Ur: NEGATIVE

## 2020-03-02 MED ORDER — BACLOFEN 10 MG PO TABS: 10.0000 mg | ORAL_TABLET | Freq: Three times a day (TID) | ORAL | 0 refills | Status: DC

## 2020-03-02 MED ORDER — PREDNISONE 10 MG (21) PO TBPK: ORAL_TABLET | ORAL | 0 refills | Status: DC

## 2020-03-02 NOTE — ED Triage Notes (Signed)
Pt in with c/o intermittent back pain that has been going on for about 2 years now. States the pain radiates to her right leg  Pt has taken baclofen and diazepam with minimal relief

## 2020-03-02 NOTE — ED Provider Notes (Signed)
[facility]    CSN: [id number] Arrival date & time: 03/02/20  0809      History   Chief Complaint Chief Complaint  Patient presents with  . Back Pain    HPI Tracy Mcconnell is a 24 y.o. female.   Patient is a 24 year old female who presents today with lower back pain.  This pain radiates to the right buttocks and in the right leg.  There is some associated numbness and tingling.  She has had this pain comes and goes to the left side.  Denies any injuries, falls, loss of bowel or bladder function.  Reporting she can hear "crunching" in her back at times.  Has been using muscle relaxants without much relief.     Past Medical History:  Diagnosis Date  . Anemia   . Anxiety   . Depression   . GERD (gastroesophageal reflux disease)   . Hypovitaminosis D 04/07/2019  . Left eye complaint 06/08/2019  . Migraines     Patient Active Problem List   Diagnosis Date Noted  . Acute appendicitis 01/17/2020  . Abdominal pain 01/16/2020  . Left lumbar radiculitis 07/24/2019  . Vitamin D deficiency 07/15/2019  . 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 Numbness 06/25/2019  . Blurry vision   . Generalized anxiety disorder   . Optic neuritis 06/10/2019  . Hypovitaminosis D 04/07/2019  . Migraines 10/28/2018  . Lumbar radiculopathy 10/28/2018  . Chronic daily headache 02/04/2017    Past Surgical History:  Procedure Laterality Date  . CHOLECYSTECTOMY N/A 10/19/2019   Procedure: LAPAROSCOPIC CHOLECYSTECTOMY;  Surgeon: Berna Bue, MD;  Location: [facility];  Service: General;  Laterality: N/A;  . LAPAROSCOPIC APPENDECTOMY N/A 01/17/2020   Procedure: APPENDECTOMY LAPAROSCOPIC;  Surgeon: Harriette Bouillon, MD;  Location: [facility];  Service: General;  Laterality: N/A;    OB History   No obstetric history on file.      Home Medications    Prior to Admission medications   Medication Sig Start Date End Date Taking? Authorizing Provider  acetaminophen  (TYLENOL) 325 MG tablet Take 2 tablets (650 mg total) by mouth every 6 (six) hours as needed for mild pain (or temp > 100). 01/18/20   Meuth, Brooke A, PA-C  baclofen (LIORESAL) 10 MG tablet Take 1 tablet (10 mg total) by mouth 3 (three) times daily. 03/02/20   Dahlia Byes A, NP  Botulinum Toxin Type A (BOTOX) 200 units SOLR Inject 155 units intramuscularly into head and neck muscles every 3 months by provider. Patient taking differently: Inject 150 Units into the muscle See admin instructions. Inject 155 units intramuscularly into head and neck muscles every 3 months by provider. 01/07/20   Lomax, Amy, NP  buPROPion (WELLBUTRIN XL) 300 MG 24 hr tablet Take 1 tablet (300 mg total) by mouth in the morning. 01/04/20 02/03/20  Janeece Agee, NP  busPIRone (BUSPAR) 30 MG tablet Take 30 mg by mouth 2 (two) times daily.    [provider]  ergocalciferol (VITAMIN D2) 1.25 MG (50000 UT) capsule Take 50,000 Units by mouth every Friday.     [provider]  FLUoxetine (PROZAC) 40 MG capsule Take 1 capsule (40 mg total) by mouth in the morning. 11/11/19 11/05/20  Janeece Agee, NP  gabapentin (NEURONTIN) 600 MG tablet Take 1 tablet (600 mg total) by mouth in the morning, at noon, in the evening, and at bedtime. 08/06/19 01/16/20  Sater, Pearletha Furl, MD  LAVENDER OIL PO Take 1 capsule  by mouth daily.    [provider]  lidocaine (XYLOCAINE) 2 % solution Use as directed 15 mLs in the mouth or throat every 4 (four) hours as needed for mouth pain (mix with warm salt water, gargle and spit as needed for throat pain). Patient taking differently: Use as directed 15 mLs in the mouth or throat every 4 (four) hours as needed for mouth pain. Mix with warm water, gargle and spit as needed for throat pain 01/15/20   Bing Neighbors, FNP  LORazepam (ATIVAN) 1 MG tablet Take 1 tablet (1 mg total) by mouth every 6 (six) hours as needed for anxiety. 01/05/20   Janeece Agee, NP  magnesium oxide (MAG-OX)  400 MG tablet Take 400 mg by mouth daily.    [provider]  naproxen (NAPROSYN) 500 MG tablet Take 1 tablet (500 mg total) by mouth 2 (two) times daily with a meal. 02/15/20   Hawks, Christy A, FNP  omeprazole (PRILOSEC) 20 MG capsule Take 1 capsule (20 mg total) by mouth daily. 10/11/19   Junie Spencer, FNP  predniSONE (STERAPRED UNI-PAK 21 TAB) 10 MG (21) TBPK tablet 6 tabs for 1 day, then 5 tabs for 1 das, then 4 tabs for 1 day, then 3 tabs for 1 day, 2 tabs for 1 day, then 1 tab for 1 day 03/02/20   Dahlia Byes A, NP  QUEtiapine (SEROQUEL) 50 MG tablet 1/2 to 1 tab qhs for sleep Patient taking differently: Take 50 mg by mouth daily.  01/05/20   Janeece Agee, NP  Rimegepant Sulfate (NURTEC) 75 MG TBDP Take 75 mg by mouth daily as needed (take for abortive therapy of migraine, no more than 1 tablet in 24 hours or 10 per month). Patient taking differently: Take 75 mg by mouth daily as needed (migraine).  12/09/19   Lomax, Amy, NP    Family History Family History  Problem Relation Age of Onset  . Migraines Mother   . Thyroid disease Mother   . Cancer Maternal Grandmother   . Diabetes Paternal Grandfather   . Hypertension Maternal Grandfather   . Diabetes Paternal Grandmother   . Stroke Paternal Grandmother     Social History Social History   Tobacco Use  . Smoking status: Never Smoker  . Smokeless tobacco: Never Used  Vaping Use  . Vaping Use: Never used  Substance Use Topics  . Alcohol use: No  . Drug use: No     Allergies   Sumatriptan and Other   Review of Systems Review of Systems   Physical Exam Triage Vital Signs ED Triage Vitals  Enc Vitals Group     BP 03/02/20 0838 117/76     Pulse Rate 03/02/20 0838 91     Resp 03/02/20 0838 18     Temp 03/02/20 0838 98.5 F (36.9 C)     Temp Source 03/02/20 0838 Oral     SpO2 03/02/20 0838 100 %     Weight --      Height --      Head Circumference --      Peak Flow --      Pain Score 03/02/20 0835 5      Pain Loc --      Pain Edu? --      Excl. in GC? --    No data found.  Updated Vital Signs BP 117/76 (BP Location: Right Arm)   Pulse 91   Temp 98.5 F (36.9 C) (Oral)   Resp  18   LMP 01/05/2020 (Approximate)   SpO2 100%   Visual Acuity Right Eye Distance:   Left Eye Distance:   Bilateral Distance:    Right Eye Near:   Left Eye Near:    Bilateral Near:     Physical Exam Vitals and nursing note reviewed.  Constitutional:      General: She is not in acute distress.    Appearance: Normal appearance. She is not ill-appearing, toxic-appearing or diaphoretic.  HENT:     Head: Normocephalic.     Nose: Nose normal.     Mouth/Throat:     Pharynx: Oropharynx is clear.  Eyes:     Conjunctiva/sclera: Conjunctivae normal.  Pulmonary:     Effort: Pulmonary effort is normal.  Musculoskeletal:        General: Normal range of motion.     Cervical back: Normal range of motion.     Comments: No specific spinal tenderness on palpation.  Skin:    General: Skin is warm and dry.     Findings: No rash.  Neurological:     Mental Status: She is alert.  Psychiatric:        Mood and Affect: Mood normal.      UC Treatments / Results  Labs (all labs ordered are listed, but only abnormal results are displayed) Labs Reviewed  POC URINE PREG, ED    EKG   Radiology DG Lumbar Spine Complete  Result Date: 03/02/2020 CLINICAL DATA:  Low back pain EXAM: LUMBAR SPINE - COMPLETE 4+ VIEW COMPARISON:  None. FINDINGS: Frontal, lateral, and bilateral oblique views were obtained. There are 5 non-rib-bearing lumbar type vertebral bodies. There is no fracture or spondylolisthesis. The disc spaces appear normal. There is no appreciable facet arthropathy. IMPRESSION: No fracture or spondylolisthesis.  No evident arthropathy. Electronically Signed   By: Bretta Bang III M.D.   On: 03/02/2020 09:30    Procedures Procedures (including critical care time)  Medications Ordered in  UC Medications - No data to display  Initial Impression / Assessment and Plan / UC Course  I have reviewed the triage vital signs and the nursing notes.  Pertinent labs & imaging results that were available during my care of the patient were reviewed by me and considered in my medical decision making (see chart for details).     Right-sided static nerve pain Nothing concerning on exam or x-ray.  No acute findings. This is a ongoing chronic issue for her.  We will treat with prednisone taper over the next 6 days.  Muscle relaxer as needed. Gave information on stretches to perform Follow up as needed for continued or worsening symptoms  Final Clinical Impressions(s) / UC Diagnoses   Final diagnoses:  Sciatic nerve pain, right     Discharge Instructions     Your x ray was normal I believe this is sciatic nerve pain I am prescribing a prednisone taper over the next 6 days. Take with food.  You can continue the muscle relaxant as needed  I am putting some information in your discharge about stretches to do.       ED Prescriptions    Medication Sig Dispense Auth. Provider   predniSONE (STERAPRED UNI-PAK 21 TAB) 10 MG (21) TBPK tablet 6 tabs for 1 day, then 5 tabs for 1 das, then 4 tabs for 1 day, then 3 tabs for 1 day, 2 tabs for 1 day, then 1 tab for 1 day 21 tablet Kylyn Sookram A, NP   baclofen (  LIORESAL) 10 MG tablet Take 1 tablet (10 mg total) by mouth 3 (three) times daily. 30 each Janace Aris, NP     PDMP not reviewed this encounter.   Janace Aris, NP 03/02/20 1048

## 2020-03-02 NOTE — Discharge Instructions (Addendum)
Your x ray was normal I believe this is sciatic nerve pain I am prescribing a prednisone taper over the next 6 days. Take with food.  You can continue the muscle relaxant as needed  I am putting some information in your discharge about stretches to do.

## 2020-03-10 ENCOUNTER — Other Ambulatory Visit: Payer: Self-pay | Admitting: Registered Nurse

## 2020-03-16 ENCOUNTER — Encounter: Payer: Self-pay | Admitting: Registered Nurse

## 2020-03-16 ENCOUNTER — Telehealth: Payer: Medicaid Other | Admitting: Nurse Practitioner

## 2020-03-16 DIAGNOSIS — M545 Low back pain, unspecified: Secondary | ICD-10-CM

## 2020-03-16 NOTE — Progress Notes (Signed)
Based on what you shared with me it looks like you have r,that should be evaluated in a face to face office visit. You need a face to face treatment at this point. You were treated in ED on 12//1/21 and in an evisit 2 days prior to that. You will have to have face to face visit for proper treatment.    NOTE: If you entered your credit card information for this eVisit, you will not be charged. You may see a "hold" on your card for the $35 but that hold will drop off and you will not have a charge processed.  If you are having a true medical emergency please call 911.     For an urgent face to face visit,  has four urgent care centers for your convenience:   . [facility]    [phone]                  Get Driving Directions  [address] [place], [place] [address] . 10 am to 8 pm Monday-Friday . 12 pm to 8 pm Saturday-Sunday   . [facility] Urgent Care at [facility]  [phone]                  Get Driving Directions  [address] [place], [place] [address] . 8 am to 8 pm Monday-Friday . 9 am to 6 pm Saturday . 11 am to 6 pm Sunday   . [facility] Urgent Care at [facility]  [phone]                  Get Driving Directions   [address] [place], [place] [address] . 8 am to 8 pm Monday-Friday . 8 am to 4 pm Saturday-Sunday    . [facility] at [facility] Directions  [phone]  [address], [address] [place], [place] [address]  . Monday-Friday, 12 PM to 6 PM    Your e-visit answers were reviewed by a board certified advanced clinical practitioner to complete your personal care plan.  Thank you for using e-Visits.

## 2020-03-21 ENCOUNTER — Telehealth: Payer: Self-pay | Admitting: Neurology

## 2020-03-21 MED ORDER — INDOMETHACIN 25 MG PO CAPS: 25.0000 mg | ORAL_CAPSULE | Freq: Every day | ORAL | 2 refills | Status: DC | PRN

## 2020-03-21 NOTE — Telephone Encounter (Signed)
We can try indomethacin 25 mg    #30 one po qd prn 2 refills.

## 2020-03-21 NOTE — Telephone Encounter (Signed)
Called [facility]. Verified they have refills on file for nurtec. They need PA. PA department [phone]. LG#[id number] P.  I called [employer] to check on status of PA Tracy Mcconnell,CMA submitted previously. There was no update as to whether is was approved or denied. I spoke with rep, Tracy Mcconnell. She states PA denied on 02/09/2020. I tried pulling up key: [id number] that Tracy Mcconnell used for PA, states no record found on CMM. Other key used: [id number]. States pt not found.  Rep unable to fax me denial letter. Transferred me to "auth" department. Spoke with Tracy Mcconnell. She advised she will be able to fax denial letter. She placed me on 2-3 min hold first. She states she will fax denial/appeal form to our office. If marked urgent, 72hr turn around. If this is not marked, turnaround is 30 calender days.   I called pt back and LVM to let her know PA denied for nurtec. Going to see what denial letter states and if we can appeal. I will also see if Dr. Epimenio Foot has an alternative option. Advised her to call back if she has further questions.

## 2020-03-21 NOTE — Telephone Encounter (Signed)
[employer] (Lilia) called, PA for Rimegepant Sulfate (NURTEC) 75 MG TBDP was denied. Want to see if you need assistant with an appeal. Contact info: [phone], Reference key: [id number].

## 2020-03-21 NOTE — Telephone Encounter (Signed)
Called pt. Relayed Dr. Bonnita Hollow recommendation. She is agreeable to try indomethacin since Nurtec is denied. I e-scribed to pharmacy for her.

## 2020-03-21 NOTE — Telephone Encounter (Signed)
Dr. Epimenio Foot- Do you have an alternative to Nurtec she can try? I am not sure if you feel we can appeal this decision?  Denial letter states pt must try and fail one of the following: NSAID, nonopioid analgesic, tylenol, OR caffeinated analgesic combination AND try/fail or have contraindication to 2 preferred triptans. (she had SE on sumatriptan, appears she has also tried rizatriptan)

## 2020-03-21 NOTE — Telephone Encounter (Signed)
Pt request refill  Rimegepant Sulfate (NURTEC) 75 MG TBDP at [facility] [id number] [place]

## 2020-03-22 ENCOUNTER — Ambulatory Visit (INDEPENDENT_AMBULATORY_CARE_PROVIDER_SITE_OTHER): Payer: Medicaid Other | Admitting: Registered Nurse

## 2020-03-22 ENCOUNTER — Other Ambulatory Visit: Payer: Self-pay

## 2020-03-22 VITALS — BP 105/71 | HR 74 | Temp 98.0°F | Resp 18 | Ht 61.0 in | Wt 117.2 lb

## 2020-03-22 DIAGNOSIS — N3000 Acute cystitis without hematuria: Secondary | ICD-10-CM | POA: Diagnosis not present

## 2020-03-22 DIAGNOSIS — M549 Dorsalgia, unspecified: Secondary | ICD-10-CM | POA: Diagnosis not present

## 2020-03-22 LAB — POCT URINALYSIS DIP (CLINITEK)
Bilirubin, UA: NEGATIVE
Glucose, UA: NEGATIVE mg/dL
Ketones, POC UA: NEGATIVE mg/dL
Nitrite, UA: NEGATIVE
POC PROTEIN,UA: NEGATIVE
Spec Grav, UA: 1.025 (ref 1.010–1.025)
Urobilinogen, UA: 0.2 E.U./dL
pH, UA: 6 (ref 5.0–8.0)

## 2020-03-22 MED ORDER — SULFAMETHOXAZOLE-TRIMETHOPRIM 800-160 MG PO TABS: 1.0000 | ORAL_TABLET | Freq: Two times a day (BID) | ORAL | 0 refills | Status: DC

## 2020-03-22 NOTE — Patient Instructions (Signed)
° ° ° °  If you have lab work done today you will be contacted with your lab results within the next 2 weeks.  If you have not heard from us then please contact us. The fastest way to get your results is to register for My Chart. ° ° °IF you received an x-ray today, you will receive an invoice from [facility]. Please contact [facility] at [phone] with questions or concerns regarding your invoice.  ° °IF you received labwork today, you will receive an invoice from [facility]. Please contact [facility] at [phone] with questions or concerns regarding your invoice.  ° °Our billing staff will not be able to assist you with questions regarding bills from these companies. ° °You will be contacted with the lab results as soon as they are available. The fastest way to get your results is to activate your My Chart account. Instructions are located on the last page of this paperwork. If you have not heard from us regarding the results in 2 weeks, please contact this office. °  ° ° ° °

## 2020-03-22 NOTE — Progress Notes (Signed)
Pt lwbs  Based on symptoms during triage and poct UA will treat as UTI Follow up prn  Jari Sportsman, NP

## 2020-04-02 DIAGNOSIS — Z419 Encounter for procedure for purposes other than remedying health state, unspecified: Secondary | ICD-10-CM | POA: Diagnosis not present

## 2020-04-08 ENCOUNTER — Ambulatory Visit: Payer: Medicaid Other | Admitting: Registered Nurse

## 2020-04-11 ENCOUNTER — Encounter: Payer: Self-pay | Admitting: Registered Nurse

## 2020-04-11 ENCOUNTER — Other Ambulatory Visit: Payer: Self-pay

## 2020-04-11 ENCOUNTER — Ambulatory Visit (INDEPENDENT_AMBULATORY_CARE_PROVIDER_SITE_OTHER): Payer: Medicaid Other | Admitting: Registered Nurse

## 2020-04-11 VITALS — BP 107/72 | HR 82 | Temp 98.1°F | Resp 15 | Ht 61.0 in | Wt 118.8 lb

## 2020-04-11 DIAGNOSIS — M549 Dorsalgia, unspecified: Secondary | ICD-10-CM | POA: Diagnosis not present

## 2020-04-11 DIAGNOSIS — F411 Generalized anxiety disorder: Secondary | ICD-10-CM | POA: Diagnosis not present

## 2020-04-11 MED ORDER — BUSPIRONE HCL 30 MG PO TABS: 30.0000 mg | ORAL_TABLET | Freq: Two times a day (BID) | ORAL | 3 refills | Status: DC

## 2020-04-11 MED ORDER — CYCLOBENZAPRINE HCL 5 MG PO TABS: 5.0000 mg | ORAL_TABLET | Freq: Three times a day (TID) | ORAL | 1 refills | Status: DC | PRN

## 2020-04-11 MED ORDER — LORAZEPAM 1 MG PO TABS: 1.0000 mg | ORAL_TABLET | Freq: Four times a day (QID) | ORAL | 1 refills | Status: DC | PRN

## 2020-04-11 NOTE — Patient Instructions (Signed)
° ° ° °  If you have lab work done today you will be contacted with your lab results within the next 2 weeks.  If you have not heard from us then please contact us. The fastest way to get your results is to register for My Chart. ° ° °IF you received an x-ray today, you will receive an invoice from [facility]. Please contact [facility] at [phone] with questions or concerns regarding your invoice.  ° °IF you received labwork today, you will receive an invoice from [facility]. Please contact [facility] at [phone] with questions or concerns regarding your invoice.  ° °Our billing staff will not be able to assist you with questions regarding bills from these companies. ° °You will be contacted with the lab results as soon as they are available. The fastest way to get your results is to activate your My Chart account. Instructions are located on the last page of this paperwork. If you have not heard from us regarding the results in 2 weeks, please contact this office. °  ° ° ° °

## 2020-04-11 NOTE — Progress Notes (Signed)
Established Patient Office Visit  Subjective:  Patient ID: Tracy Mcconnell, female    DOB: July 29, 1995  Age: 25 y.o. MRN: [id number]  CC:  Chief Complaint  Patient presents with  . Back Pain    Pt here for follow up on back pain was given Abx for UTI last OV and this did not help pt reports no sxs have changed     HPI Tracy Mcconnell presents for lower back pain Midline, chronic Feels like it's getting worse and more frequent Has felt like pressure builds, then has a snap/crack noise and it releases No saddle symptoms Has had some R side sciatica in the past Was given baclofen at urgent care on 03/02/20, has not helped Dg lumbar spine at that time wnl  No other concerns today.  Past Medical History:  Diagnosis Date  . Anemia   . Anxiety   . Depression   . GERD (gastroesophageal reflux disease)   . Hypovitaminosis D 04/07/2019  . Left eye complaint 06/08/2019  . Migraines     Past Surgical History:  Procedure Laterality Date  . CHOLECYSTECTOMY N/A 10/19/2019   Procedure: LAPAROSCOPIC CHOLECYSTECTOMY;  Surgeon: Berna Bue, MD;  Location: [facility];  Service: General;  Laterality: N/A;  . LAPAROSCOPIC APPENDECTOMY N/A 01/17/2020   Procedure: APPENDECTOMY LAPAROSCOPIC;  Surgeon: Harriette Bouillon, MD;  Location: [facility];  Service: General;  Laterality: N/A;    Family History  Problem Relation Age of Onset  . Migraines Mother   . Thyroid disease Mother   . Cancer Maternal Grandmother   . Diabetes Paternal Grandfather   . Hypertension Maternal Grandfather   . Diabetes Paternal Grandmother   . Stroke Paternal Grandmother     Social History   Socioeconomic History  . Marital status: Significant Other    Spouse name: Not on file  . Number of children: 0  . Years of education: college student  . Highest education level: Not on file  Occupational History  . Not on file  Tobacco Use  . Smoking status: Never Smoker  . Smokeless tobacco: Never Used   Vaping Use  . Vaping Use: Never used  Substance and Sexual Activity  . Alcohol use: No  . Drug use: No  . Sexual activity: Not Currently  Other Topics Concern  . Not on file  Social History Narrative   Lives at home alone   Right handed   Does not drink caffeine, makes her nauseated   Social Determinants of Health   Financial Resource Strain: Not on file  Food Insecurity: Not on file  Transportation Needs: Not on file  Physical Activity: Not on file  Stress: Not on file  Social Connections: Not on file  Intimate Partner Violence: Not on file    Outpatient Medications Prior to Visit  Medication Sig Dispense Refill  . acetaminophen (TYLENOL) 325 MG tablet Take 2 tablets (650 mg total) by mouth every 6 (six) hours as needed for mild pain (or temp > 100).    . baclofen (LIORESAL) 10 MG tablet Take 1 tablet (10 mg total) by mouth 3 (three) times daily. 30 each 0  . Botulinum Toxin Type A (BOTOX) 200 units SOLR Inject 155 units intramuscularly into head and neck muscles every 3 months by provider. (Patient taking differently: Inject 150 Units into the muscle See admin instructions. Inject 155 units intramuscularly into head and neck muscles every 3 months by provider.) 1 each 3  . ergocalciferol (VITAMIN D2) 1.25 MG (50000 UT)  capsule Take 50,000 Units by mouth every Friday.     [name] FLUoxetine (PROZAC) 40 MG capsule Take 1 capsule (40 mg total) by mouth in the morning. 90 capsule 3  . indomethacin (INDOCIN) 25 MG capsule Take 1 capsule (25 mg total) by mouth daily as needed. 30 capsule 2  . LAVENDER OIL PO Take 1 capsule by mouth daily.    [name] lidocaine (XYLOCAINE) 2 % solution Use as directed 15 mLs in the mouth or throat every 4 (four) hours as needed for mouth pain (mix with warm salt water, gargle and spit as needed for throat pain). (Patient taking differently: Use as directed 15 mLs in the mouth or throat every 4 (four) hours as needed for mouth pain. Mix with warm water, gargle and  spit as needed for throat pain) 60 mL 0  . magnesium oxide (MAG-OX) 400 MG tablet Take 400 mg by mouth daily.    . naproxen (NAPROSYN) 500 MG tablet Take 1 tablet (500 mg total) by mouth 2 (two) times daily with a meal. 30 tablet 0  . omeprazole (PRILOSEC) 20 MG capsule Take 1 capsule (20 mg total) by mouth daily. 30 capsule 3  . predniSONE (STERAPRED UNI-PAK 21 TAB) 10 MG (21) TBPK tablet 6 tabs for 1 day, then 5 tabs for 1 das, then 4 tabs for 1 day, then 3 tabs for 1 day, 2 tabs for 1 day, then 1 tab for 1 day 21 tablet 0  . QUEtiapine (SEROQUEL) 50 MG tablet 1/2 to 1 tab qhs for sleep (Patient taking differently: Take 50 mg by mouth daily.) 30 tablet 0  . busPIRone (BUSPAR) 30 MG tablet Take 30 mg by mouth 2 (two) times daily.    [name] LORazepam (ATIVAN) 1 MG tablet Take 1 tablet (1 mg total) by mouth every 6 (six) hours as needed for anxiety. 30 tablet 1  . buPROPion (WELLBUTRIN XL) 300 MG 24 hr tablet TAKE 1 TABLET (300 MG TOTAL) BY MOUTH IN THE MORNING. 30 tablet 1  . gabapentin (NEURONTIN) 600 MG tablet Take 1 tablet (600 mg total) by mouth in the morning, at noon, in the evening, and at bedtime. 360 tablet 0  . sulfamethoxazole-trimethoprim (BACTRIM DS) 800-160 MG tablet Take 1 tablet by mouth 2 (two) times daily. (Patient not taking: Reported on 04/11/2020) 6 tablet 0   No facility-administered medications prior to visit.    Allergies  Allergen Reactions  . Sumatriptan Shortness Of Breath and Other (See Comments)    Entire body "felt heavy"  . Other Itching, Swelling and Other (See Comments)    Pesticides (family history)- Exposure face and mouth itch and the tongue/lips swell    ROS Review of Systems  Constitutional: Negative.   HENT: Negative.   Eyes: Negative.   Respiratory: Negative.   Cardiovascular: Negative.   Gastrointestinal: Negative.   Endocrine: Negative.   Genitourinary: Negative.   Musculoskeletal: Positive for back pain.  Skin: Negative.   Allergic/Immunologic:  Negative.   Neurological: Negative.   Hematological: Negative.   Psychiatric/Behavioral: Negative.   All other systems reviewed and are negative.     Objective:    Physical Exam  BP 107/72   Pulse 82   Temp 98.1 F (36.7 C) (Temporal)   Resp 15   Ht 5\' 1"  (1.549 m)   Wt 118 lb 12.8 oz (53.9 kg)   SpO2 96%   BMI 22.45 kg/m  Wt Readings from Last 3 Encounters:  04/11/20 118 lb 12.8 oz (53.9 kg)  03/22/20 117 lb 3.2 oz (53.2 kg)  01/16/20 124 lb 3.2 oz (56.3 kg)     There are no preventive care reminders to display for this patient.  There are no preventive care reminders to display for this patient.  Lab Results  Component Value Date   TSH 0.494 12/28/2019   Lab Results  Component Value Date   WBC 5.8 01/17/2020   HGB 11.1 (L) 01/17/2020   HCT 34.0 (L) 01/17/2020   MCV 91.4 01/17/2020   PLT 302 01/17/2020   Lab Results  Component Value Date   NA 139 01/16/2020   K 3.8 01/16/2020   CO2 23 01/16/2020   GLUCOSE 85 01/16/2020   BUN 6 01/16/2020   CREATININE 0.84 01/16/2020   BILITOT 0.5 01/16/2020   ALKPHOS 79 01/16/2020   AST 21 01/16/2020   ALT 16 01/16/2020   PROT 7.3 01/16/2020   ALBUMIN 4.1 01/16/2020   CALCIUM 8.1 (L) 01/16/2020   ANIONGAP 11 01/16/2020   No results found for: CHOL No results found for: HDL No results found for: LDLCALC No results found for: TRIG No results found for: CHOLHDL Lab Results  Component Value Date   HGBA1C 5.2 12/28/2019      Assessment & Plan:   Problem List Items Addressed This Visit      Other   Generalized anxiety disorder - Primary   Relevant Medications   busPIRone (BUSPAR) 30 MG tablet   LORazepam (ATIVAN) 1 MG tablet    Other Visit Diagnoses    Back pain, unspecified back location, unspecified back pain laterality, unspecified chronicity       Relevant Medications   cyclobenzaprine (FLEXERIL) 5 MG tablet      Meds ordered this encounter  Medications  . busPIRone (BUSPAR) 30 MG tablet     Sig: Take 1 tablet (30 mg total) by mouth 2 (two) times daily.    Dispense:  90 tablet    Refill:  3  . LORazepam (ATIVAN) 1 MG tablet    Sig: Take 1 tablet (1 mg total) by mouth every 6 (six) hours as needed for anxiety.    Dispense:  30 tablet    Refill:  1  . cyclobenzaprine (FLEXERIL) 5 MG tablet    Sig: Take 1 tablet (5 mg total) by mouth 3 (three) times daily as needed for muscle spasms.    Dispense:  30 tablet    Refill:  1    Order Specific Question:   Supervising Provider    Answer:   Neva Seat, JEFFREY R [[id number]]    Follow-up: No follow-ups on file.   PLAN  Refill meds as requested  Refer to neurosurg, pt may benefit from MRI  Patient encouraged to call clinic with any questions, comments, or concerns.  Janeece Agee, NP

## 2020-04-14 DIAGNOSIS — M5416 Radiculopathy, lumbar region: Secondary | ICD-10-CM | POA: Diagnosis not present

## 2020-04-18 ENCOUNTER — Ambulatory Visit: Payer: Medicaid Other | Admitting: Family Medicine

## 2020-04-20 NOTE — Progress Notes (Signed)
She returns today for fourth Botox procedure. She tolerated last procedure well [name]). She does feel Botox is helping migraines. She does continue to have about 10 migraine days per month. She has failed sumatriptan and rizatriptan. She is taking gabapentin and indomethacin for back pain. Nurtec works well but not covered by [employer]. She has not tried [place]. 1 Nurtec sample and 2 Ubrelvy 50mg  samples given in office today.    She did have optic neuritis in March. MRI concerning for foci. LP performed and CSF normal. She is being followed by Dr April every 6 months with repeat imaging planned. She reports vision loss of left eye resolved. She has had some blurred/double vision bilaterally and is followed by Dr Epimenio Foot.   Consent Form Botulism Toxin Injection For Chronic Migraine    Reviewed orally with patient, additionally signature is on file:  Botulism toxin has been approved by the Federal drug administration for treatment of chronic migraine. Botulism toxin does not cure chronic migraine and it may not be effective in some patients.  The administration of botulism toxin is accomplished by injecting a small amount of toxin into the muscles of the neck and head. Dosage must be titrated for each individual. Any benefits resulting from botulism toxin tend to wear off after 3 months with a repeat injection required if benefit is to be maintained. Injections are usually done every 3-4 months with maximum effect peak achieved by about 2 or 3 weeks. Botulism toxin is expensive and you should be sure of what costs you will incur resulting from the injection.  The side effects of botulism toxin use for chronic migraine may include:   -Transient, and usually mild, facial weakness with facial injections  -Transient, and usually mild, head or neck weakness with head/neck injections  -Reduction or loss of forehead facial animation due to forehead muscle weakness  -Eyelid drooping  -Dry  eye  -Pain at the site of injection or bruising at the site of injection  -Double vision  -Potential unknown long term risks   Contraindications: You should not have Botox if you are pregnant, nursing, allergic to albumin, have an infection, skin condition, or muscle weakness at the site of the injection, or have myasthenia gravis, Lambert-Eaton syndrome, or ALS.  It is also possible that as with any injection, there may be an allergic reaction or no effect from the medication. Reduced effectiveness after repeated injections is sometimes seen and rarely infection at the injection site may occur. All care will be taken to prevent these side effects. If therapy is given over a long time, atrophy and wasting in the muscle injected may occur. Occasionally the patient's become refractory to treatment because they develop antibodies to the toxin. In this event, therapy needs to be modified.  I have read the above information and consent to the administration of botulism toxin.    BOTOX PROCEDURE NOTE FOR MIGRAINE HEADACHE  Contraindications and precautions discussed with patient(above). Aseptic procedure was observed and patient tolerated procedure. Procedure performed by Dione Booze, FNP-C.   The condition has existed for more than 6 months, and pt does not have a diagnosis of ALS, Myasthenia Gravis or Lambert-Eaton Syndrome.  Risks and benefits of injections discussed and pt agrees to proceed with the procedure.  Written consent obtained  These injections are medically necessary. Pt  receives good benefits from these injections. These injections do not cause sedations or hallucinations which the oral therapies may cause.   Description of procedure:  The patient was placed in a sitting position. The standard protocol was used for Botox as follows, with 5 units of Botox injected at each site:  -Procerus muscle, midline injection  -Corrugator muscle, bilateral injection  -Frontalis muscle,  bilateral injection, with 2 sites each side, medial injection was performed in the upper one third of the frontalis muscle, in the region vertical from the medial inferior edge of the superior orbital rim. The lateral injection was again in the upper one third of the forehead vertically above the lateral limbus of the cornea, 1.5 cm lateral to the medial injection site.  -Temporalis muscle injection, 4 sites, bilaterally. The first injection was 3 cm above the tragus of the ear, second injection site was 1.5 cm to 3 cm up from the first injection site in line with the tragus of the ear. The third injection site was 1.5-3 cm forward between the first 2 injection sites. The fourth injection site was 1.5 cm posterior to the second injection site. 5th site laterally in the temporalis  muscleat the level of the outer canthus.  -Occipitalis muscle injection, 3 sites, bilaterally. The first injection was done one half way between the occipital protuberance and the tip of the mastoid process behind the ear. The second injection site was done lateral and superior to the first, 1 fingerbreadth from the first injection. The third injection site was 1 fingerbreadth superiorly and medially from the first injection site.  -Cervical paraspinal muscle injection, 2 sites, bilaterally. The first injection site was 1 cm from the midline of the cervical spine, 3 cm inferior to the lower border of the occipital protuberance. The second injection site was 1.5 cm superiorly and laterally to the first injection site.  -Trapezius muscle injection was performed at 3 sites, bilaterally. The first injection site was in the upper trapezius muscle halfway between the inflection point of the neck, and the acromion. The second injection site was one half way between the acromion and the first injection site. The third injection was done between the first injection site and the inflection point of the neck.   Will return for repeat  injection in 3 months.   A total of 200 units of Botox was prepared, 155 units of Botox was injected as documented above, any Botox not injected was wasted. The patient tolerated the procedure well, there were no complications of the above procedure.  agree with procedure as stated.     Naomie Dean, MD [facility]

## 2020-04-21 ENCOUNTER — Ambulatory Visit: Payer: Medicaid Other | Admitting: Family Medicine

## 2020-04-21 DIAGNOSIS — G43709 Chronic migraine without aura, not intractable, without status migrainosus: Secondary | ICD-10-CM | POA: Diagnosis not present

## 2020-04-21 MED ORDER — NURTEC 75 MG PO TBDP: 75.0000 mg | ORAL_TABLET | Freq: Every day | ORAL | 0 refills | Status: DC | PRN

## 2020-04-21 MED ORDER — UBRELVY 100 MG PO TABS: 100.0000 mg | ORAL_TABLET | Freq: Every day | ORAL | 11 refills | Status: DC | PRN

## 2020-04-21 MED ORDER — UBRELVY 50 MG PO TABS: 50.0000 mg | ORAL_TABLET | Freq: Every day | ORAL | 0 refills | Status: DC | PRN

## 2020-04-21 NOTE — Progress Notes (Signed)
Botox- 200 units x 1 vial Lot: [id number] Expiration: 6/24 NDC: 7092-9574-73  Bacteriostatic 0.9% Sodium Chloride- 39mL total Lot: [id number] Expiration: 2/23 NDC: 43838-184-03  Dx: F54.360 BB

## 2020-04-22 ENCOUNTER — Telehealth: Payer: Self-pay | Admitting: *Deleted

## 2020-04-22 NOTE — Telephone Encounter (Signed)
Tried initiating PA Ubrelvy on CMM. Key:[id number]. Member not found. I will have to follow up with pt to verify insurance info.

## 2020-04-25 ENCOUNTER — Other Ambulatory Visit: Payer: Self-pay

## 2020-04-25 ENCOUNTER — Ambulatory Visit (HOSPITAL_COMMUNITY)
Admission: EM | Admit: 2020-04-25 | Discharge: 2020-04-25 | Disposition: A | Discharge status: Home / Self Care | Payer: Medicaid Other | Attending: Family Medicine | Admitting: Family Medicine

## 2020-04-25 ENCOUNTER — Encounter (HOSPITAL_COMMUNITY): Payer: Self-pay

## 2020-04-25 ENCOUNTER — Ambulatory Visit (INDEPENDENT_AMBULATORY_CARE_PROVIDER_SITE_OTHER): Payer: Medicaid Other

## 2020-04-25 DIAGNOSIS — M25471 Effusion, right ankle: Secondary | ICD-10-CM

## 2020-04-25 DIAGNOSIS — X501XXA Overexertion from prolonged static or awkward postures, initial encounter: Secondary | ICD-10-CM | POA: Diagnosis not present

## 2020-04-25 DIAGNOSIS — M25571 Pain in right ankle and joints of right foot: Secondary | ICD-10-CM

## 2020-04-25 DIAGNOSIS — M7989 Other specified soft tissue disorders: Secondary | ICD-10-CM | POA: Diagnosis not present

## 2020-04-25 NOTE — ED Provider Notes (Signed)
[facility]    CSN: [id number] Arrival date & time: 04/25/20  7829      History   Chief Complaint Chief Complaint  Patient presents with  . Fall    Occurred yesterday    HPI Tracy Mcconnell is a 25 y.o. female.   Here today with 1 day history of right lateral ankle pain, swelling and bruising after twisting it playing tennis yesterday. Painful to bear weight or move. Has been icing and trying OTC pain relievers without relief. No numbness, tingling, weakness.      Past Medical History:  Diagnosis Date  . Anemia   . Anxiety   . Depression   . GERD (gastroesophageal reflux disease)   . Hypovitaminosis D 04/07/2019  . Left eye complaint 06/08/2019  . Migraines     Patient Active Problem List   Diagnosis Date Noted  . Acute appendicitis 01/17/2020  . Abdominal pain 01/16/2020  . Left lumbar radiculitis 07/24/2019  . Vitamin D deficiency 07/15/2019  . 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 Numbness 06/25/2019  . Blurry vision   . Generalized anxiety disorder   . Optic neuritis 06/10/2019  . Hypovitaminosis D 04/07/2019  . Migraines 10/28/2018  . Lumbar radiculopathy 10/28/2018  . Chronic daily headache 02/04/2017    Past Surgical History:  Procedure Laterality Date  . CHOLECYSTECTOMY N/A 10/19/2019   Procedure: LAPAROSCOPIC CHOLECYSTECTOMY;  Surgeon: Berna Bue, MD;  Location: [facility];  Service: General;  Laterality: N/A;  . LAPAROSCOPIC APPENDECTOMY N/A 01/17/2020   Procedure: APPENDECTOMY LAPAROSCOPIC;  Surgeon: Harriette Bouillon, MD;  Location: [facility];  Service: General;  Laterality: N/A;    OB History   No obstetric history on file.      Home Medications    Prior to Admission medications   Medication Sig Start Date End Date Taking? Authorizing Provider  acetaminophen (TYLENOL) 325 MG tablet Take 2 tablets (650 mg total) by mouth every 6 (six) hours as needed for mild pain (or temp > 100). 01/18/20  Yes Meuth,  Brooke A, PA-C  Botulinum Toxin Type A (BOTOX) 200 units SOLR Inject 155 units intramuscularly into head and neck muscles every 3 months by provider. Patient taking differently: Inject 150 Units into the muscle See admin instructions. Inject 155 units intramuscularly into head and neck muscles every 3 months by provider. 01/07/20  Yes Lomax, Amy, NP  buPROPion (WELLBUTRIN XL) 300 MG 24 hr tablet TAKE 1 TABLET (300 MG TOTAL) BY MOUTH IN THE MORNING. 03/10/20 04/09/20 Yes Janeece Agee, NP  busPIRone (BUSPAR) 30 MG tablet Take 1 tablet (30 mg total) by mouth 2 (two) times daily. 04/11/20  Yes Janeece Agee, NP  cyclobenzaprine (FLEXERIL) 5 MG tablet Take 1 tablet (5 mg total) by mouth 3 (three) times daily as needed for muscle spasms. 04/11/20  Yes Janeece Agee, NP  ergocalciferol (VITAMIN D2) 1.25 MG (50000 UT) capsule Take 50,000 Units by mouth every Friday.    Yes [provider]  FLUoxetine (PROZAC) 40 MG capsule Take 1 capsule (40 mg total) by mouth in the morning. 11/11/19 11/05/20 Yes Janeece Agee, NP  gabapentin (NEURONTIN) 600 MG tablet Take 1 tablet (600 mg total) by mouth in the morning, at noon, in the evening, and at bedtime. 08/06/19 01/16/20 Yes Sater, Pearletha Furl, MD  LORazepam (ATIVAN) 1 MG tablet Take 1 tablet (1 mg total) by mouth every 6 (six) hours as needed for anxiety. 04/11/20  Yes Janeece Agee, NP  magnesium oxide (MAG-OX)  400 MG tablet Take 400 mg by mouth daily.   Yes [provider]  naproxen (NAPROSYN) 500 MG tablet Take 1 tablet (500 mg total) by mouth 2 (two) times daily with a meal. 02/15/20  Yes Hawks, Christy A, FNP  omeprazole (PRILOSEC) 20 MG capsule Take 1 capsule (20 mg total) by mouth daily. 10/11/19  Yes Hawks, Christy A, FNP  Ubrogepant (UBRELVY) 100 MG TABS Take 100 mg by mouth daily as needed. Take one tablet at onset of headache, may repeat 1 tablet in 2 hours, no more than 2 tablets in 24 hours 04/21/20  Yes Lomax, Amy, NP  Ubrogepant (UBRELVY)  50 MG TABS Take 50 mg by mouth daily as needed. Take one tablet at onset of headache, may repeat 1 tablet in 2 hours, no more than 2 tablets in 24 hours 04/21/20  Yes Lomax, Amy, NP  baclofen (LIORESAL) 10 MG tablet Take 1 tablet (10 mg total) by mouth 3 (three) times daily. Patient not taking: No sig reported 03/02/20   Dahlia Byes A, NP  indomethacin (INDOCIN) 25 MG capsule Take 1 capsule (25 mg total) by mouth daily as needed. 03/21/20   Sater, Pearletha Furl, MD  LAVENDER OIL PO Take 1 capsule by mouth daily.    [provider]  lidocaine (XYLOCAINE) 2 % solution Use as directed 15 mLs in the mouth or throat every 4 (four) hours as needed for mouth pain (mix with warm salt water, gargle and spit as needed for throat pain). Patient not taking: No sig reported 01/15/20   Bing Neighbors, FNP  predniSONE (STERAPRED UNI-PAK 21 TAB) 10 MG (21) TBPK tablet 6 tabs for 1 day, then 5 tabs for 1 das, then 4 tabs for 1 day, then 3 tabs for 1 day, 2 tabs for 1 day, then 1 tab for 1 day Patient not taking: No sig reported 03/02/20   Dahlia Byes A, NP  QUEtiapine (SEROQUEL) 50 MG tablet 1/2 to 1 tab qhs for sleep Patient taking differently: Take 50 mg by mouth daily. 01/05/20   Janeece Agee, NP  Rimegepant Sulfate (NURTEC) 75 MG TBDP Take 75 mg by mouth daily as needed (take for abortive therapy of migraine, no more than 1 tablet in 24 hours or 10 per month). 04/21/20   Shawnie Dapper, NP    Family History Family History  Problem Relation Age of Onset  . Migraines Mother   . Thyroid disease Mother   . Cancer Maternal Grandmother   . Diabetes Paternal Grandfather   . Hypertension Maternal Grandfather   . Diabetes Paternal Grandmother   . Stroke Paternal Grandmother     Social History Social History   Tobacco Use  . Smoking status: Never Smoker  . Smokeless tobacco: Never Used  Vaping Use  . Vaping Use: Never used  Substance Use Topics  . Alcohol use: No  . Drug use: No     Allergies    Sumatriptan and Other   Review of Systems Review of Systems PER HPI   Physical Exam Triage Vital Signs ED Triage Vitals  Enc Vitals Group     BP 04/25/20 1118 117/79     Pulse Rate 04/25/20 1118 94     Resp 04/25/20 1118 17     Temp 04/25/20 1118 98 F (36.7 C)     Temp Source 04/25/20 1118 Oral     SpO2 04/25/20 1118 100 %     Weight --      Height --  Head Circumference --      Peak Flow --      Pain Score 04/25/20 1120 5     Pain Loc --      Pain Edu? --      Excl. in GC? --    No data found.  Updated Vital Signs BP 117/79 (BP Location: Right Arm)   Pulse 94   Temp 98 F (36.7 C) (Oral)   Resp 17   LMP 04/15/2020 (Approximate)   SpO2 100%   Visual Acuity Right Eye Distance:   Left Eye Distance:   Bilateral Distance:    Right Eye Near:   Left Eye Near:    Bilateral Near:     Physical Exam Vitals and nursing note reviewed.  Constitutional:      Appearance: Normal appearance. She is not ill-appearing.  HENT:     Head: Atraumatic.  Eyes:     Extraocular Movements: Extraocular movements intact.     Conjunctiva/sclera: Conjunctivae normal.  Cardiovascular:     Rate and Rhythm: Normal rate and regular rhythm.     Heart sounds: Normal heart sounds.  Pulmonary:     Effort: Pulmonary effort is normal.     Breath sounds: Normal breath sounds.  Musculoskeletal:        General: Swelling (bruising and swelling to right lateral ankle around malleolus), tenderness (in area of swelling) and signs of injury present. No deformity. Normal range of motion.     Cervical back: Normal range of motion and neck supple.     Comments: Ambulating with a limp   Skin:    General: Skin is warm and dry.     Findings: Bruising present.  Neurological:     Mental Status: She is alert and oriented to person, place, and time.     Comments: Right LE neurovascularly intact  Psychiatric:        Mood and Affect: Mood normal.        Thought Content: Thought content normal.         Judgment: Judgment normal.      UC Treatments / Results  Labs (all labs ordered are listed, but only abnormal results are displayed) Labs Reviewed - No data to display  EKG   Radiology DG Ankle Complete Right  Result Date: 04/25/2020 CLINICAL DATA:  Twisted right ankle yesterday playing tennis. Pain, swelling, and bruising laterally. EXAM: RIGHT ANKLE - COMPLETE 3+ VIEW COMPARISON:  None. FINDINGS: There is mild soft tissue swelling about the lateral and anterior aspects of the ankle. No fracture or dislocation is identified. Joint space widths are preserved. IMPRESSION: Soft tissue swelling without acute osseous abnormality identified. Electronically Signed   By: Sebastian Ache M.D.   On: 04/25/2020 12:13    Procedures Procedures (including critical care time)  Medications Ordered in UC Medications - No data to display  Initial Impression / Assessment and Plan / UC Course  I have reviewed the triage vital signs and the nursing notes.  Pertinent labs & imaging results that were available during my care of the patient were reviewed by me and considered in my medical decision making (see chart for details).     X-ray negative, exam reassuring. Ace wrap applied, RICE, OTC pain relievers reviewed. Return for acutely worsening sxs.   Final Clinical Impressions(s) / UC Diagnoses   Final diagnoses:  Acute right ankle pain   Discharge Instructions   None    ED Prescriptions    None     PDMP  not reviewed this encounter.   Particia Nearing, [place] 04/25/20 1314

## 2020-04-25 NOTE — Telephone Encounter (Signed)
I have sent pt a message via my chart asking for updated insurance information.

## 2020-04-25 NOTE — ED Triage Notes (Signed)
Patient states she was playing tennis yesterday and twisted her ankle and fell. Pt states she did not lose consciousness or hit her head and is not on blood thinners. Pt is aox4 and ambulatory.

## 2020-04-28 DIAGNOSIS — M5416 Radiculopathy, lumbar region: Secondary | ICD-10-CM | POA: Diagnosis not present

## 2020-04-28 DIAGNOSIS — M545 Low back pain, unspecified: Secondary | ICD-10-CM | POA: Diagnosis not present

## 2020-05-03 DIAGNOSIS — Z419 Encounter for procedure for purposes other than remedying health state, unspecified: Secondary | ICD-10-CM | POA: Diagnosis not present

## 2020-05-21 ENCOUNTER — Other Ambulatory Visit: Payer: Self-pay | Admitting: Registered Nurse

## 2020-05-21 NOTE — Telephone Encounter (Signed)
Requested Prescriptions  Pending Prescriptions Disp Refills  . buPROPion (WELLBUTRIN XL) 300 MG 24 hr tablet [Pharmacy Med Name: BUPROPION HCL XL 300 MG TABLET] 30 tablet 1    Sig: TAKE 1 TABLET BY MOUTH IN THE MORNING     Psychiatry: Antidepressants - bupropion Passed - 05/21/2020 11:15 AM      Passed - Last BP in normal range    BP Readings from Last 1 Encounters:  04/25/20 117/79         Passed - Valid encounter within last 6 months    Recent Outpatient Visits          1 month ago Generalized anxiety disorder   [facility], Gerlene Burdock, NP   2 months ago Back pain, unspecified back location, unspecified back pain laterality, unspecified chronicity   [facility], Gerlene Burdock, NP   4 months ago Hot flashes not due to menopause   [facility], Gerlene Burdock, NP   6 months ago Generalized anxiety disorder   [facility], Gerlene Burdock, NP   7 months ago Screening for endocrine, metabolic and immunity disorder   [facility], Richard, NP             . QUEtiapine (SEROQUEL) 50 MG tablet [Pharmacy Med Name: QUETIAPINE FUMARATE 50 MG TAB] 30 tablet 0    Sig: TAKE 1/2 - 1 TABLET BY MOUTH AT BEDTIME FOR SLEEP     Not Delegated - Psychiatry:  Antipsychotics - Second Generation (Atypical) - quetiapine Failed - 05/21/2020 11:15 AM      Failed - This refill cannot be delegated      Passed - ALT in normal range and within 180 days    ALT  Date Value Ref Range Status  01/16/2020 16 0 - 44 U/L Final         Passed - AST in normal range and within 180 days    AST  Date Value Ref Range Status  01/16/2020 21 15 - 41 U/L Final         Passed - Last BP in normal range    BP Readings from Last 1 Encounters:  04/25/20 117/79         Passed - Valid encounter within last 6 months    Recent Outpatient Visits          1 month ago Generalized anxiety disorder   [facility], Richard, NP   2 months ago  Back pain, unspecified back location, unspecified back pain laterality, unspecified chronicity   [facility], Richard, NP   4 months ago Hot flashes not due to menopause   [facility], Gerlene Burdock, NP   6 months ago Generalized anxiety disorder   [facility], Gerlene Burdock, NP   7 months ago Screening for endocrine, metabolic and immunity disorder   [facility], Gerlene Burdock, NP

## 2020-05-21 NOTE — Telephone Encounter (Signed)
Requested medication (s) are due for refill today: yes  Requested medication (s) are on the active medication list: yes  Last refill: 01/05/20  Future visit scheduled: no  Notes to clinic:  not delegated    Requested Prescriptions  Pending Prescriptions Disp Refills   QUEtiapine (SEROQUEL) 50 MG tablet [Pharmacy Med Name: QUETIAPINE FUMARATE 50 MG TAB] 30 tablet 0    Sig: TAKE 1/2 - 1 TABLET BY MOUTH AT BEDTIME FOR SLEEP      Not Delegated - Psychiatry:  Antipsychotics - Second Generation (Atypical) - quetiapine Failed - 05/21/2020 11:15 AM      Failed - This refill cannot be delegated      Passed - ALT in normal range and within 180 days    ALT  Date Value Ref Range Status  01/16/2020 16 0 - 44 U/L Final          Passed - AST in normal range and within 180 days    AST  Date Value Ref Range Status  01/16/2020 21 15 - 41 U/L Final          Passed - Last BP in normal range    BP Readings from Last 1 Encounters:  04/25/20 117/79          Passed - Valid encounter within last 6 months    Recent Outpatient Visits           1 month ago Generalized anxiety disorder   [facility], Richard, NP   2 months ago Back pain, unspecified back location, unspecified back pain laterality, unspecified chronicity   [facility], Richard, NP   4 months ago Hot flashes not due to menopause   [facility], Gerlene Burdock, NP   6 months ago Generalized anxiety disorder   [facility], Richard, NP   7 months ago Screening for endocrine, metabolic and immunity disorder   [facility], Gerlene Burdock, NP                 Signed Prescriptions Disp Refills   buPROPion (WELLBUTRIN XL) 300 MG 24 hr tablet 30 tablet 1    Sig: TAKE 1 TABLET BY MOUTH IN THE MORNING      Psychiatry: Antidepressants - bupropion Passed - 05/21/2020 11:15 AM      Passed - Last BP in normal range    BP Readings from Last 1 Encounters:   04/25/20 117/79          Passed - Valid encounter within last 6 months    Recent Outpatient Visits           1 month ago Generalized anxiety disorder   [facility], Richard, NP   2 months ago Back pain, unspecified back location, unspecified back pain laterality, unspecified chronicity   [facility], Richard, NP   4 months ago Hot flashes not due to menopause   [facility], Gerlene Burdock, NP   6 months ago Generalized anxiety disorder   [facility], Gerlene Burdock, NP   7 months ago Screening for endocrine, metabolic and immunity disorder   [facility], Gerlene Burdock, NP

## 2020-05-22 ENCOUNTER — Encounter: Payer: Self-pay | Admitting: Registered Nurse

## 2020-05-22 ENCOUNTER — Other Ambulatory Visit: Payer: Self-pay

## 2020-05-22 DIAGNOSIS — M62838 Other muscle spasm: Secondary | ICD-10-CM | POA: Insufficient documentation

## 2020-05-22 DIAGNOSIS — R200 Anesthesia of skin: Secondary | ICD-10-CM | POA: Diagnosis not present

## 2020-05-22 DIAGNOSIS — R519 Headache, unspecified: Secondary | ICD-10-CM | POA: Insufficient documentation

## 2020-05-22 DIAGNOSIS — H538 Other visual disturbances: Secondary | ICD-10-CM | POA: Diagnosis not present

## 2020-05-22 DIAGNOSIS — R202 Paresthesia of skin: Secondary | ICD-10-CM | POA: Diagnosis not present

## 2020-05-22 DIAGNOSIS — G379 Demyelinating disease of central nervous system, unspecified: Secondary | ICD-10-CM | POA: Diagnosis not present

## 2020-05-22 DIAGNOSIS — H469 Unspecified optic neuritis: Secondary | ICD-10-CM | POA: Diagnosis not present

## 2020-05-23 ENCOUNTER — Emergency Department (HOSPITAL_COMMUNITY)
Admission: EM | Admit: 2020-05-23 | Discharge: 2020-05-23 | Disposition: A | Discharge status: Home / Self Care | Payer: Medicaid Other | Attending: Emergency Medicine | Admitting: Emergency Medicine

## 2020-05-23 ENCOUNTER — Encounter (HOSPITAL_COMMUNITY): Payer: Self-pay | Admitting: Emergency Medicine

## 2020-05-23 ENCOUNTER — Emergency Department (HOSPITAL_COMMUNITY): Payer: Medicaid Other

## 2020-05-23 DIAGNOSIS — R202 Paresthesia of skin: Secondary | ICD-10-CM | POA: Diagnosis not present

## 2020-05-23 DIAGNOSIS — R519 Headache, unspecified: Secondary | ICD-10-CM

## 2020-05-23 DIAGNOSIS — R200 Anesthesia of skin: Secondary | ICD-10-CM | POA: Diagnosis not present

## 2020-05-23 DIAGNOSIS — M62838 Other muscle spasm: Secondary | ICD-10-CM

## 2020-05-23 DIAGNOSIS — H469 Unspecified optic neuritis: Secondary | ICD-10-CM | POA: Diagnosis not present

## 2020-05-23 DIAGNOSIS — G379 Demyelinating disease of central nervous system, unspecified: Secondary | ICD-10-CM | POA: Diagnosis not present

## 2020-05-23 LAB — CBC WITH DIFFERENTIAL/PLATELET
Abs Immature Granulocytes: 0.04 10*3/uL (ref 0.00–0.07)
Basophils Absolute: 0.1 10*3/uL (ref 0.0–0.1)
Basophils Relative: 0 %
Eosinophils Absolute: 0.2 10*3/uL (ref 0.0–0.5)
Eosinophils Relative: 1 %
HCT: 41.3 % (ref 36.0–46.0)
Hemoglobin: 13.9 g/dL (ref 12.0–15.0)
Immature Granulocytes: 0 %
Lymphocytes Relative: 22 %
Lymphs Abs: 2.8 10*3/uL (ref 0.7–4.0)
MCH: 29.4 pg (ref 26.0–34.0)
MCHC: 33.7 g/dL (ref 30.0–36.0)
MCV: 87.3 fL (ref 80.0–100.0)
Monocytes Absolute: 0.9 10*3/uL (ref 0.1–1.0)
Monocytes Relative: 7 %
Neutro Abs: 8.5 10*3/uL — ABNORMAL HIGH (ref 1.7–7.7)
Neutrophils Relative %: 70 %
Platelets: 419 10*3/uL — ABNORMAL HIGH (ref 150–400)
RBC: 4.73 MIL/uL (ref 3.87–5.11)
RDW: 11.9 % (ref 11.5–15.5)
WBC: 12.5 10*3/uL — ABNORMAL HIGH (ref 4.0–10.5)
nRBC: 0 % (ref 0.0–0.2)

## 2020-05-23 LAB — COMPREHENSIVE METABOLIC PANEL
ALT: 17 U/L (ref 0–44)
AST: 19 U/L (ref 15–41)
Albumin: 4 g/dL (ref 3.5–5.0)
Alkaline Phosphatase: 77 U/L (ref 38–126)
Anion gap: 10 (ref 5–15)
BUN: 13 mg/dL (ref 6–20)
CO2: 22 mmol/L (ref 22–32)
Calcium: 9.6 mg/dL (ref 8.9–10.3)
Chloride: 103 mmol/L (ref 98–111)
Creatinine, Ser: 0.67 mg/dL (ref 0.44–1.00)
GFR, Estimated: >60 mL/min (ref >60)
Glucose, Bld: 108 mg/dL — ABNORMAL HIGH (ref 70–99)
Potassium: 3.7 mmol/L (ref 3.5–5.1)
Sodium: 135 mmol/L (ref 135–145)
Total Bilirubin: 0.5 mg/dL (ref 0.3–1.2)
Total Protein: 6.6 g/dL (ref 6.5–8.1)

## 2020-05-23 LAB — I-STAT BETA HCG BLOOD, ED (MC, WL, AP ONLY): I-stat hCG, quantitative: <5 m[IU]/mL (ref <5)

## 2020-05-23 LAB — CK: Total CK: 109 U/L (ref 38–234)

## 2020-05-23 MED ORDER — KETOROLAC TROMETHAMINE 30 MG/ML IJ SOLN
30.0000 mg | Freq: Once | INTRAMUSCULAR | Status: AC
  Administered 2020-05-23: 30 mg via INTRAVENOUS
  Filled 2020-05-23: qty 1

## 2020-05-23 MED ORDER — DIPHENHYDRAMINE HCL 50 MG/ML IJ SOLN
25.0000 mg | Freq: Once | INTRAMUSCULAR | Status: AC
  Administered 2020-05-23: 25 mg via INTRAVENOUS
  Filled 2020-05-23: qty 1

## 2020-05-23 MED ORDER — LORAZEPAM 2 MG/ML IJ SOLN: 1.0000 mg | Freq: Once | INTRAMUSCULAR | Status: DC

## 2020-05-23 MED ORDER — GADOBUTROL 1 MMOL/ML IV SOLN
5.0000 mL | Freq: Once | INTRAVENOUS | Status: AC | PRN
  Administered 2020-05-23: 5 mL via INTRAVENOUS

## 2020-05-23 MED ORDER — PROCHLORPERAZINE EDISYLATE 10 MG/2ML IJ SOLN
10.0000 mg | Freq: Once | INTRAMUSCULAR | Status: AC
  Administered 2020-05-23: 10 mg via INTRAVENOUS
  Filled 2020-05-23: qty 2

## 2020-05-23 MED ORDER — SODIUM CHLORIDE 0.9 % IV BOLUS
1000.0000 mL | Freq: Once | INTRAVENOUS | Status: AC
  Administered 2020-05-23: 1000 mL via INTRAVENOUS

## 2020-05-23 NOTE — ED Triage Notes (Signed)
Pt reports muscle spasms for about 30 minutes now followed by numbness all over her body.  PA in triage to see pt.

## 2020-05-23 NOTE — ED Provider Notes (Signed)
[facility] EMERGENCY DEPARTMENT Provider Note   CSN: [id number] Arrival date & time: 05/22/20  2350     History Chief Complaint  Patient presents with  . Spasms    Tracy Mcconnell is a 25 y.o. female with a history of generalized anxiety disorder, depression, optic neuritis in the left eye who presents to the emergency department with a chief complaint of muscle spasms.  The patient reports that tonight that she had an episode that began with muscle spasms noted throughout her entire body.  She reports that the spasms then subsided and her whole body "locked up".  She states that the entire episode lasted for approximately 30 minutes.  Following this episode, she reports numbness to her entire body, which has persisted since onset.  She also reports that this morning that she developed right eye pain and a throbbing headache located behind her right eye as well as blurred vision in her right eye.  She states that she has had this similar constellation of symptoms previously on the left side when she is diagnosed with optic neuritis.  She is concerned that she may now have optic neuritis on her right side.  She denies dark-colored urine, chest pain, shortness of breath, fever, chills, nausea, vomiting, diarrhea, constipation, dysuria, weakness, slurred speech, facial droop, palpitations, syncope, or seizure-like activity.  No treatment prior to arrival.  The history is provided by the patient and medical records. No language interpreter was used.       Past Medical History:  Diagnosis Date  . Anemia   . Anxiety   . Depression   . GERD (gastroesophageal reflux disease)   . Hypovitaminosis D 04/07/2019  . Left eye complaint 06/08/2019  . Migraines     Patient Active Problem List   Diagnosis Date Noted  . Acute appendicitis 01/17/2020  . Abdominal pain 01/16/2020  . Left lumbar radiculitis 07/24/2019  . Vitamin D deficiency 07/15/2019  . White matter  abnormality on MRI of brain 06/25/2019  . Numbness 06/25/2019  . Blurry vision   . Generalized anxiety disorder   . Optic neuritis 06/10/2019  . Hypovitaminosis D 04/07/2019  . Migraines 10/28/2018  . Lumbar radiculopathy 10/28/2018  . Chronic daily headache 02/04/2017    Past Surgical History:  Procedure Laterality Date  . CHOLECYSTECTOMY N/A 10/19/2019   Procedure: LAPAROSCOPIC CHOLECYSTECTOMY;  Surgeon: Berna Bue, MD;  Location: [facility];  Service: General;  Laterality: N/A;  . LAPAROSCOPIC APPENDECTOMY N/A 01/17/2020   Procedure: APPENDECTOMY LAPAROSCOPIC;  Surgeon: Harriette Bouillon, MD;  Location: [facility];  Service: General;  Laterality: N/A;     OB History   No obstetric history on file.     Family History  Problem Relation Age of Onset  . Migraines Mother   . Thyroid disease Mother   . Cancer Maternal Grandmother   . Diabetes Paternal Grandfather   . Hypertension Maternal Grandfather   . Diabetes Paternal Grandmother   . Stroke Paternal Grandmother     Social History   Tobacco Use  . Smoking status: Never Smoker  . Smokeless tobacco: Never Used  Vaping Use  . Vaping Use: Never used  Substance Use Topics  . Alcohol use: No  . Drug use: No    Home Medications Prior to Admission medications   Medication Sig Start Date End Date Taking? Authorizing Provider  acetaminophen (TYLENOL) 325 MG tablet Take 2 tablets (650 mg total) by mouth every 6 (six) hours as needed for mild pain (or  temp > 100). 01/18/20   Meuth, Brooke A, PA-C  baclofen (LIORESAL) 10 MG tablet Take 1 tablet (10 mg total) by mouth 3 (three) times daily. Patient not taking: No sig reported 03/02/20   Dahlia Byes A, NP  Botulinum Toxin Type A (BOTOX) 200 units SOLR Inject 155 units intramuscularly into head and neck muscles every 3 months by provider. Patient taking differently: Inject 150 Units into the muscle See admin instructions. Inject 155 units intramuscularly into head and neck muscles  every 3 months by provider. 01/07/20   Lomax, Amy, NP  buPROPion (WELLBUTRIN XL) 300 MG 24 hr tablet TAKE 1 TABLET BY MOUTH IN THE MORNING 05/21/20   Janeece Agee, NP  busPIRone (BUSPAR) 30 MG tablet Take 1 tablet (30 mg total) by mouth 2 (two) times daily. 04/11/20   Janeece Agee, NP  cyclobenzaprine (FLEXERIL) 5 MG tablet Take 1 tablet (5 mg total) by mouth 3 (three) times daily as needed for muscle spasms. 04/11/20   Janeece Agee, NP  ergocalciferol (VITAMIN D2) 1.25 MG (50000 UT) capsule Take 50,000 Units by mouth every Friday.     [provider]  FLUoxetine (PROZAC) 40 MG capsule Take 1 capsule (40 mg total) by mouth in the morning. 11/11/19 11/05/20  Janeece Agee, NP  gabapentin (NEURONTIN) 600 MG tablet Take 1 tablet (600 mg total) by mouth in the morning, at noon, in the evening, and at bedtime. 08/06/19 01/16/20  Sater, Pearletha Furl, MD  indomethacin (INDOCIN) 25 MG capsule Take 1 capsule (25 mg total) by mouth daily as needed. 03/21/20   Sater, Pearletha Furl, MD  LAVENDER OIL PO Take 1 capsule by mouth daily.    [provider]  lidocaine (XYLOCAINE) 2 % solution Use as directed 15 mLs in the mouth or throat every 4 (four) hours as needed for mouth pain (mix with warm salt water, gargle and spit as needed for throat pain). Patient not taking: No sig reported 01/15/20   Bing Neighbors, FNP  LORazepam (ATIVAN) 1 MG tablet Take 1 tablet (1 mg total) by mouth every 6 (six) hours as needed for anxiety. 04/11/20   Janeece Agee, NP  magnesium oxide (MAG-OX) 400 MG tablet Take 400 mg by mouth daily.    [provider]  naproxen (NAPROSYN) 500 MG tablet Take 1 tablet (500 mg total) by mouth 2 (two) times daily with a meal. 02/15/20   Hawks, Christy A, FNP  omeprazole (PRILOSEC) 20 MG capsule Take 1 capsule (20 mg total) by mouth daily. 10/11/19   Junie Spencer, FNP  predniSONE (STERAPRED UNI-PAK 21 TAB) 10 MG (21) TBPK tablet 6 tabs for 1 day, then 5 tabs for 1 das,  then 4 tabs for 1 day, then 3 tabs for 1 day, 2 tabs for 1 day, then 1 tab for 1 day Patient not taking: No sig reported 03/02/20   Dahlia Byes A, NP  QUEtiapine (SEROQUEL) 50 MG tablet TAKE 1/2 - 1 TABLET BY MOUTH AT BEDTIME FOR SLEEP 05/23/20   Janeece Agee, NP  Rimegepant Sulfate (NURTEC) 75 MG TBDP Take 75 mg by mouth daily as needed (take for abortive therapy of migraine, no more than 1 tablet in 24 hours or 10 per month). 04/21/20   Lomax, Amy, NP  Ubrogepant (UBRELVY) 100 MG TABS Take 100 mg by mouth daily as needed. Take one tablet at onset of headache, may repeat 1 tablet in 2 hours, no more than 2 tablets in 24 hours 04/21/20   Lomax,  Amy, NP  Ubrogepant (UBRELVY) 50 MG TABS Take 50 mg by mouth daily as needed. Take one tablet at onset of headache, may repeat 1 tablet in 2 hours, no more than 2 tablets in 24 hours 04/21/20   Lomax, Amy, NP    Allergies    Sumatriptan and Other  Review of Systems   Review of Systems  Constitutional: Negative for activity change, chills and fever.  HENT: Negative for congestion and sore throat.   Eyes: Positive for visual disturbance.  Respiratory: Negative for cough, shortness of breath and wheezing.   Cardiovascular: Negative for chest pain, palpitations and leg swelling.  Gastrointestinal: Negative for abdominal pain, constipation, diarrhea, nausea and vomiting.  Genitourinary: Negative for difficulty urinating, dysuria, flank pain, frequency, pelvic pain, vaginal discharge and vaginal pain.  Musculoskeletal: Positive for myalgias. Negative for arthralgias, back pain, gait problem, neck pain and neck stiffness.  Skin: Negative for color change, rash and wound.  Allergic/Immunologic: Negative for immunocompromised state.  Neurological: Positive for numbness and headaches. Negative for dizziness, seizures, syncope and weakness.  Psychiatric/Behavioral: Negative for confusion.    Physical Exam Updated Vital Signs BP 93/64   Pulse 80   Temp 97.8  F (36.6 C) (Oral)   Resp 15   SpO2 100%   Physical Exam Vitals and nursing note reviewed.  Constitutional:      General: She is not in acute distress.    Appearance: She is not ill-appearing, toxic-appearing or diaphoretic.  HENT:     Head: Normocephalic.  Eyes:     Extraocular Movements: Extraocular movements intact.     Conjunctiva/sclera: Conjunctivae normal.     Pupils: Pupils are equal, round, and reactive to light.     Comments: No obvious abnormalities on funduscopic exam, but limited as patient's pupils are not dilated.  Cardiovascular:     Rate and Rhythm: Normal rate and regular rhythm.     Heart sounds: No murmur heard. No friction rub. No gallop.   Pulmonary:     Effort: Pulmonary effort is normal. No respiratory distress.     Breath sounds: No stridor. No wheezing, rhonchi or rales.  Chest:     Chest wall: No tenderness.  Abdominal:     General: There is no distension.     Palpations: Abdomen is soft. There is no mass.     Tenderness: There is no abdominal tenderness. There is no right CVA tenderness, left CVA tenderness, guarding or rebound.     Hernia: No hernia is present.  Musculoskeletal:     Cervical back: Neck supple.  Skin:    General: Skin is warm.     Findings: No rash.  Neurological:     Mental Status: She is alert.     Comments: Patient reports decreased sensation to pain, light touch, and temperature throughout the body. GCS 15.  Spontaneously moves all 4 extremities.  Follows simple commands.  Patient reports decreased sensation to the entire face, otherwise cranial nerves II through XII are grossly intact.  5-5 strength against resistance of the large muscle groups of the upper and lower extremities.  Gait is not ataxic.  No pronator drift.  Negative Romberg.  Finger-to-nose is intact bilaterally.  Psychiatric:        Mood and Affect: Mood is anxious.        Behavior: Behavior normal.     ED Results / Procedures / Treatments   Labs (all labs  ordered are listed, but only abnormal results are displayed) Labs Reviewed  CBC WITH DIFFERENTIAL/PLATELET - Abnormal; Notable for the following components:      Result Value   WBC 12.5 (*)    Platelets 419 (*)    Neutro Abs 8.5 (*)    All other components within normal limits  COMPREHENSIVE METABOLIC PANEL - Abnormal; Notable for the following components:   Glucose, Bld 108 (*)    All other components within normal limits  CK  I-STAT BETA HCG BLOOD, ED ([facility], [facility], [facility] ONLY)    EKG None  Radiology MR Brain W and Wo Contrast  Result Date: 05/23/2020 CLINICAL DATA:  Numbness, tingling and paresthesias. Visual disturbance. Suspected optic neuritis. EXAM: MRI HEAD WITHOUT AND WITH CONTRAST TECHNIQUE: Multiplanar, multiecho pulse sequences of the brain and surrounding structures were obtained without and with intravenous contrast. CONTRAST:  16mL GADAVIST GADOBUTROL 1 MMOL/ML IV SOLN COMPARISON:  12/24/2019.  06/10/2019. FINDINGS: Brain: No lesions showing restricted diffusion. No focal abnormality affects the brainstem or cerebellum. Within the cerebral hemispheres, there is focal abnormal T2 and FLAIR signal in the white matter adjacent to the temporal horn of the left lateral ventricle and a few foci I elsewhere adjacent to bodies of the lateral ventricles. The findings are consistent with demyelinating disease/multiple sclerosis and are stable since 12/24/2019. No mass, hemorrhage, hydrocephalus or extra-axial collection. No abnormal contrast enhancement. Vascular: Major vessels at the base of the brain show flow. Skull and upper cervical spine: Negative Sinuses/Orbits: Sinuses are clear.  See results of orbital MRI. Other: None IMPRESSION: Multiple foci of abnormal T2 and FLAIR signal within the cerebral hemispheric white matter consistent with demyelinating disease/multiple sclerosis. Stable appearance since 12/24/2019. No restricted diffusion or contrast enhancement. Electronically Signed   By:  Paulina Fusi M.D.   On: 05/23/2020 03:27   MR Cervical Spine W or Wo Contrast  Result Date: 05/23/2020 CLINICAL DATA:  Numbness, tingling and paresthesia. History of demyelinating disease. EXAM: MRI CERVICAL SPINE WITHOUT AND WITH CONTRAST TECHNIQUE: Multiplanar and multiecho pulse sequences of the cervical spine, to include the craniocervical junction and cervicothoracic junction, were obtained without and with intravenous contrast. CONTRAST:  64mL GADAVIST GADOBUTROL 1 MMOL/ML IV SOLN COMPARISON:  06/11/2019 FINDINGS: Alignment: Normal Vertebrae: Normal Cord: The study suffers from some motion degradation, but no abnormal cord signal is identified. Posterior Fossa, vertebral arteries, paraspinal tissues: Normal Disc levels: No degenerative disease.  No stenosis of the canal or foramina. IMPRESSION: Motion degraded but otherwise normal examination of the cervical spine. No evidence of demyelinating disease or degenerative disease. Electronically Signed   By: Paulina Fusi M.D.   On: 05/23/2020 03:29   MR ORBITS W WO CONTRAST  Result Date: 05/23/2020 CLINICAL DATA:  Numbness and tingling of the body. History of optic neuritis on the left. EXAM: MRI OF THE ORBITS WITHOUT AND WITH CONTRAST TECHNIQUE: Multiplanar, multisequence MR imaging of the orbits was performed both before and after the administration of intravenous contrast. CONTRAST:  23mL GADAVIST GADOBUTROL 1 MMOL/ML IV SOLN COMPARISON:  06/10/2019 FINDINGS: Right orbit is normal. No evidence of optic neuritis on that side. On the left, there is abnormal T2 signal affecting the optic nerve in the posterior orbital segment. Minimal abnormal enhancement of the nerve present in that location. Findings consistent with optic neuritis. Other orbital structures on the left are normal. IMPRESSION: 1. Abnormal T2 signal and enhancement of the left optic nerve in the posterior orbital segment, consistent with optic neuritis. Similar appearance to the study of 2021  allowing for technical differences. 2. No evidence  of right optic neuritis. Electronically Signed   By: Paulina Fusi M.D.   On: 05/23/2020 03:33    Procedures Procedures   Medications Ordered in ED Medications  LORazepam (ATIVAN) injection 1 mg (has no administration in time range)  gadobutrol (GADAVIST) 1 MMOL/ML injection 5 mL (5 mLs Intravenous Contrast Given 05/23/20 0316)  ketorolac (TORADOL) 30 MG/ML injection 30 mg (30 mg Intravenous Given 05/23/20 0441)  prochlorperazine (COMPAZINE) injection 10 mg (10 mg Intravenous Given 05/23/20 0441)  diphenhydrAMINE (BENADRYL) injection 25 mg (25 mg Intravenous Given 05/23/20 0441)  sodium chloride 0.9 % bolus 1,000 mL (0 mLs Intravenous Stopped 05/23/20 0548)    ED Course  I have reviewed the triage vital signs and the nursing notes.  Pertinent labs & imaging results that were available during my care of the patient were reviewed by me and considered in my medical decision making (see chart for details).    MDM Rules/Calculators/A&P                          25 year old female with a history of generalized anxiety disorder, depression, optic neuritis in the left eye who presents to the emergency department with diffuse muscle spasms throughout the entire body followed by feeling as if her muscles are locking up.  She states this episode lasted for approximately 30 minutes, but then her entire body "went numb".  Numbness has not resolved.  Vital signs are stable.  Patient was seen by myself in triage upon arrival to the ER and comprehensive neuro exam was performed.  She had decreased sensation to light and sharp touch as well as temperature throughout the body.  No other focal neurologic deficits.  She did also express concern that she may be developing optic neuritis on the right side she had developed a right-sided headache, blurred vision.   I discussed the patient with Dr. Amada Jupiter, neurology.  He recommends MRI orbits, brain, C-spine  with and without contrast.  Given that she is also having complaints of muscle spasms, will check CK level as well as electrolytes.  Labs and imaging have been reviewed and independently interpreted by me.  No metabolic derangements.  CK level is normal.  MRI results were discussed via phone with Dr. Amada Jupiter who independently reviewed the images.  MRI of the orbits does evidence of previous left optic neuritis, but does not have any acute findings.  MRI brain and C-spine do not show any acute findings.  He recommends outpatient follow-up.   On reevaluation, she reports that symptoms have persisted.  Will attempt to treat patient with migraine cocktail for complex migraine.  Following migraine cocktail, no improvement in her symptoms.  Given her work-up, have a low suspicion for right-sided optic neuritis, MS flare, rhabdomyolysis, metabolic derangements, or seizure-like activity.  At this time, she is hemodynamically stable and in no acute distress, safe for discharge to home with outpatient follow-up to neurology.  All questions answered.  ER return precautions given.   Final Clinical Impression(s) / ED Diagnoses Final diagnoses:  Right-sided headache  Numbness  Muscle spasm    Rx / DC Orders ED Discharge Orders    None       Barkley Boards, PA-C 05/23/20 0758    Dione Booze, MD 05/23/20 2241

## 2020-05-23 NOTE — Discharge Instructions (Signed)
Thank you for allowing me to care for you today in the Emergency Department.   Your work-up today in the ER was reassuring.  There was no evidence of optic neuritis.  MRI brain did not demonstrate any new findings.  Please call to schedule follow-up appointment with neurology.  Return to the emergency department if you become unable to walk, develop loss of vision in one or both eyes, slurred speech, facial droop, respiratory distress, or other new, concerning symptoms.

## 2020-05-24 ENCOUNTER — Other Ambulatory Visit: Payer: Self-pay

## 2020-05-24 ENCOUNTER — Ambulatory Visit: Payer: Medicaid Other | Admitting: Neurology

## 2020-05-24 ENCOUNTER — Encounter: Payer: Self-pay | Admitting: Neurology

## 2020-05-24 ENCOUNTER — Telehealth: Payer: Self-pay | Admitting: *Deleted

## 2020-05-24 VITALS — BP 105/70 | HR 88 | Wt 122.5 lb

## 2020-05-24 DIAGNOSIS — R200 Anesthesia of skin: Secondary | ICD-10-CM | POA: Diagnosis not present

## 2020-05-24 DIAGNOSIS — H469 Unspecified optic neuritis: Secondary | ICD-10-CM | POA: Diagnosis not present

## 2020-05-24 DIAGNOSIS — R9082 White matter disease, unspecified: Secondary | ICD-10-CM

## 2020-05-24 NOTE — Progress Notes (Signed)
[facility]  PATIENT: Tracy Mcconnell DOB: 04/02/1996  REFERRING DOCTOR OR PCP: Dr. Lucia Gaskins and neurology);  PCP is [facility] SOURCE: Patient, notes from neurology and hospital, imaging and lab reports, MRI images personally reviewed  _________________________________   HISTORICAL  CHIEF COMPLAINT:  Chief Complaint  Patient presents with  . Follow-up    RM 12, alone. Went to ED yesterday for possible optic neuritis. Treated with migraine cocktail/IV. Pt states it was ineffective. They also did MRI's.     HISTORY OF PRESENT ILLNESS:  Tracy Mcconnell is a 25 y.o. woman with left optic neuritis and abnormal brain MRi  Update 05/24/2020: Yesterday, she had the onset of headache behind the and right viaual changes.  Additionally, she was having full body muscle spasms.   This was followed by near complete numbness in her face, arms and legs bilaterally.    She feels her strength is baseline.   She feels she can't tell when she needs to urinate but has no incontinence.    Notes from her emergency room visit were reviewed.  MRI images were personally reviewed (see below).  She has no new foci in the brain and the spinal cord was normal.  There is no evidence of new optic neuritis.  Compared to yesterday, the numbness persist.  It is bilateral in the face, arms and legs.  The vision has improved.  She reports the left vision form the 2021 left optic neuriti has improved but not to baseline.   Colors are still desaturated on that side.   She can read out of that eye.   She was having migraine headaches about 15 days a month.  Nurtec helps the most when one occurs.   She is getting Botox (Amy Nicholas Lose, last one in January) with benefit now.  It helps more than Ajovy did.       She has had her Covid-19 vaccinations.     Optic Neuritis History: In February 2021, she had the onset of left eye blurry vision and pain, worse with eye movements.    She saw urgent  care and then ophthalmology   She was felt to have optic neuritis and presented to the [facility] ED.   MRI of the brain and orbits showed enhancement of the left optic nerve,   There were a few T2 hyperintense foci in the hemispheres, including a periventricular focus in the right frontal lobe and one juxtacortical focus in the right parietal lobe.  None of the brain foci enhanced.  In retrospect, she has numbness and weakness in her legs that lasted a week, left > right in November 2019.   She had an LP 07/01/2019 and CSF showed no OCB and normal IgG Index   Imaging: MRI of the brain 05/23/2020 showed scattered T2/FLAIR hyperintense foci including 2 in the periventricular white matter and 1 in the juxtacortical white matter.  None of the foci enhance.  Compared to the MRI of the brain from 12/24/2019 and 06/10/2019, there are no new lesions.  MRI of the cervical spine 05/23/2020 showed a normal spinal cord.  No degenerative changes.  MRI of the orbits 05/23/2020 showed no enhancing lesions.  There was slight hyperintensity of the left optic nerve consistent with the previous optic neuritis.  Her maternal aunt and two second cousins have MS.    REVIEW OF SYSTEMS: Constitutional: No fevers, chills, sweats, or change in appetite Eyes: No visual changes, double vision, eye pain Ear, nose and throat:  No hearing loss, ear pain, nasal congestion, sore throat Cardiovascular: No chest pain, palpitations Respiratory: No shortness of breath at rest or with exertion.   No wheezes GastrointestinaI: No nausea, vomiting, diarrhea, abdominal pain, fecal incontinence Genitourinary: No dysuria, urinary retention or frequency.  No nocturia. Musculoskeletal: No neck pain, back pain Integumentary: No rash, pruritus, skin lesions Neurological: as above Psychiatric: No depression at this time.  No anxiety Endocrine: No palpitations, diaphoresis, change in appetite, change in weigh or increased  thirst Hematologic/Lymphatic: No anemia, purpura, petechiae. Allergic/Immunologic: No itchy/runny eyes, nasal congestion, recent allergic reactions, rashes  ALLERGIES: Allergies  Allergen Reactions  . Sumatriptan Shortness Of Breath and Other (See Comments)    Entire body "felt heavy"  . Other Itching, Swelling and Other (See Comments)    Pesticides (family history)- Exposure face and mouth itch and the tongue/lips swell    HOME MEDICATIONS:  Current Outpatient Medications:  .  acetaminophen (TYLENOL) 325 MG tablet, Take 2 tablets (650 mg total) by mouth every 6 (six) hours as needed for mild pain (or temp > 100)., Disp: , Rfl:  .  baclofen (LIORESAL) 10 MG tablet, Take 1 tablet (10 mg total) by mouth 3 (three) times daily., Disp: 30 each, Rfl: 0 .  Botulinum Toxin Type A (BOTOX) 200 units SOLR, Inject 155 units intramuscularly into head and neck muscles every 3 months by provider. (Patient taking differently: Inject 150 Units into the muscle See admin instructions. Inject 155 units intramuscularly into head and neck muscles every 3 months by provider.), Disp: 1 each, Rfl: 3 .  buPROPion (WELLBUTRIN XL) 300 MG 24 hr tablet, TAKE 1 TABLET BY MOUTH IN THE MORNING, Disp: 30 tablet, Rfl: 1 .  busPIRone (BUSPAR) 30 MG tablet, Take 1 tablet (30 mg total) by mouth 2 (two) times daily., Disp: 90 tablet, Rfl: 3 .  cyclobenzaprine (FLEXERIL) 5 MG tablet, Take 1 tablet (5 mg total) by mouth 3 (three) times daily as needed for muscle spasms., Disp: 30 tablet, Rfl: 1 .  ergocalciferol (VITAMIN D2) 1.25 MG (50000 UT) capsule, Take 50,000 Units by mouth every Friday. , Disp: , Rfl:  .  FLUoxetine (PROZAC) 40 MG capsule, Take 1 capsule (40 mg total) by mouth in the morning., Disp: 90 capsule, Rfl: 3 .  indomethacin (INDOCIN) 25 MG capsule, Take 1 capsule (25 mg total) by mouth daily as needed., Disp: 30 capsule, Rfl: 2 .  LAVENDER OIL PO, Take 1 capsule by mouth daily., Disp: , Rfl:  .  LORazepam (ATIVAN)  1 MG tablet, Take 1 tablet (1 mg total) by mouth every 6 (six) hours as needed for anxiety., Disp: 30 tablet, Rfl: 1 .  magnesium oxide (MAG-OX) 400 MG tablet, Take 400 mg by mouth daily., Disp: , Rfl:  .  naproxen (NAPROSYN) 500 MG tablet, Take 1 tablet (500 mg total) by mouth 2 (two) times daily with a meal., Disp: 30 tablet, Rfl: 0 .  omeprazole (PRILOSEC) 20 MG capsule, Take 1 capsule (20 mg total) by mouth daily., Disp: 30 capsule, Rfl: 3 .  QUEtiapine (SEROQUEL) 50 MG tablet, TAKE 1/2 - 1 TABLET BY MOUTH AT BEDTIME FOR SLEEP, Disp: 30 tablet, Rfl: 0 .  Ubrogepant (UBRELVY) 100 MG TABS, Take 100 mg by mouth daily as needed. Take one tablet at onset of headache, may repeat 1 tablet in 2 hours, no more than 2 tablets in 24 hours, Disp: 10 tablet, Rfl: 11 .  Ubrogepant (UBRELVY) 50 MG TABS, Take 50 mg by mouth daily  as needed. Take one tablet at onset of headache, may repeat 1 tablet in 2 hours, no more than 2 tablets in 24 hours, Disp: 2 tablet, Rfl: 0 .  gabapentin (NEURONTIN) 600 MG tablet, Take 1 tablet (600 mg total) by mouth in the morning, at noon, in the evening, and at bedtime., Disp: 360 tablet, Rfl: 0  PAST MEDICAL HISTORY: Past Medical History:  Diagnosis Date  . Anemia   . Anxiety   . Depression   . GERD (gastroesophageal reflux disease)   . Hypovitaminosis D 04/07/2019  . Left eye complaint 06/08/2019  . Migraines     PAST SURGICAL HISTORY: Past Surgical History:  Procedure Laterality Date  . CHOLECYSTECTOMY N/A 10/19/2019   Procedure: LAPAROSCOPIC CHOLECYSTECTOMY;  Surgeon: Berna Bue, MD;  Location: [facility];  Service: General;  Laterality: N/A;  . LAPAROSCOPIC APPENDECTOMY N/A 01/17/2020   Procedure: APPENDECTOMY LAPAROSCOPIC;  Surgeon: Harriette Bouillon, MD;  Location: [facility];  Service: General;  Laterality: N/A;    FAMILY HISTORY: Family History  Problem Relation Age of Onset  . Migraines Mother   . Thyroid disease Mother   . Cancer Maternal Grandmother   .  Diabetes Paternal Grandfather   . Hypertension Maternal Grandfather   . Diabetes Paternal Grandmother   . Stroke Paternal Grandmother     SOCIAL HISTORY:  Social History   Socioeconomic History  . Marital status: Significant Other    Spouse name: Not on file  . Number of children: 0  . Years of education: college student  . Highest education level: Not on file  Occupational History  . Not on file  Tobacco Use  . Smoking status: Never Smoker  . Smokeless tobacco: Never Used  Vaping Use  . Vaping Use: Never used  Substance and Sexual Activity  . Alcohol use: No  . Drug use: No  . Sexual activity: Not Currently  Other Topics Concern  . Not on file  Social History Narrative   Lives at home alone   Right handed   Does not drink caffeine, makes her nauseated   Social Determinants of Health   Financial Resource Strain: Not on file  Food Insecurity: Not on file  Transportation Needs: Not on file  Physical Activity: Not on file  Stress: Not on file  Social Connections: Not on file  Intimate Partner Violence: Not on file     PHYSICAL EXAM  Vitals:   05/24/20 0843  BP: 105/70  Pulse: 88  SpO2: 98%  Weight: 122 lb 8 oz (55.6 kg)    Body mass index is 23.15 kg/m.  No exam data present  General: The patient is well-developed and well-nourished and in no acute distress  HEENT:  Head is [place]/AT.  Sclera are anicteric.  Funduscopic exam shows normal optic discs and retinal vessels.  Skin: Extremities are without rash or  edema.   Neurologic Exam  Mental status: The patient is alert and oriented x 3 at the time of the examination. The patient has apparent normal recent and remote memory, with an apparently normal attention span and concentration ability.   Speech is normal.  Cranial nerves: Extraocular movements are full. Pupils show 1+ left APD.   She has reduced color vision OS.  Facial strength was normal and symmetric.[name] No obvious hearing deficits are  noted.  Motor:  Muscle bulk is normal.   Tone is normal. Strength is  5 / 5 in all 4 extremities.   Sensory: She reports reduced/altered sensation in the  bilateral arms, legs and bilateral face.  Coordination: Cerebellar testing reveals good finger-nose-finger and heel-to-shin bilaterally.  Gait and station: Station is normal.   Gait is normal. Tandem gait is normal. Romberg is negative.   Reflexes: Deep tendon reflexes are symmetric and normal bilaterally (2 in the arms and 3 in the legs).   Plantar responses are flexor.    DIAGNOSTIC DATA (LABS, IMAGING, TESTING) - I reviewed patient records, labs, notes, testing and imaging myself where available.  Lab Results  Component Value Date   WBC 12.5 (H) 05/23/2020   HGB 13.9 05/23/2020   HCT 41.3 05/23/2020   MCV 87.3 05/23/2020   PLT 419 (H) 05/23/2020      Component Value Date/Time   NA 135 05/23/2020 0053   NA 140 12/28/2019 1655   K 3.7 05/23/2020 0053   CL 103 05/23/2020 0053   CO2 22 05/23/2020 0053   GLUCOSE 108 (H) 05/23/2020 0053   BUN 13 05/23/2020 0053   BUN 9 12/28/2019 1655   CREATININE 0.67 05/23/2020 0053   CALCIUM 9.6 05/23/2020 0053   PROT 6.6 05/23/2020 0053   PROT 7.5 12/28/2019 1655   ALBUMIN 4.0 05/23/2020 0053   ALBUMIN 4.9 12/28/2019 1655   ALBUMIN 4.2 07/01/2019 0919   AST 19 05/23/2020 0053   ALT 17 05/23/2020 0053   ALKPHOS 77 05/23/2020 0053   BILITOT 0.5 05/23/2020 0053   BILITOT 0.3 12/28/2019 1655   GFRNONAA >60 05/23/2020 0053   GFRAA 138 12/28/2019 1655   No results found for: CHOL, HDL, LDLCALC, LDLDIRECT, TRIG, CHOLHDL Lab Results  Component Value Date   HGBA1C 5.2 12/28/2019   Lab Results  Component Value Date   VITAMINB12 553 06/17/2019   Lab Results  Component Value Date   TSH 0.494 12/28/2019       ASSESSMENT AND PLAN  Optic neuritis - Plan: Anti-MOG, Serum, Neuromyelitis optica autoab, IgG  White matter abnormality on MRI of brain  Numbness  1.   Currently,  she has numbness.  The pattern of numbness, bilateral face, arm and leg, is not consistent with no changes in the MRI.  Demyelination would be less likely to cause the symptoms.  We discussed the association of ON (she had in Feb 2021) with MS.  As she has > 3 lesions in her head, the likelihood of eventual MS diagnosis is > 50%, likely around 75%.   New symptoms of numbness are non-physiologic (bilateral face, arm, leg affecting vibration and touch/temp both) and no evidence of new right ON.   CSF was normal and the MRI shows no new lesions. She does not meet McDonald criteria.    2.   She will continue Botox and Nurtec.    3.   Will need another MRi in 6 months. 4.   rtc 6 month or sooner based on MRI or if new or worsening symptoms.  45-minute office visit with the majority of the time spent face-to-face for history and physical, discussion/counseling and decision-making.  Additional time with record review and documentation.  Jen Eppinger A. Epimenio Foot, MD, [facility] 05/24/2020, 5:48 PM Certified in Neurology, Clinical Neurophysiology, Sleep Medicine and Neuroimaging  [facility] [address] [place], [place] [address] [phone]

## 2020-05-24 NOTE — Telephone Encounter (Signed)
Gave completed/signed order to intrafusion for IV solumedrol 1Gx1day

## 2020-05-26 DIAGNOSIS — M5416 Radiculopathy, lumbar region: Secondary | ICD-10-CM | POA: Diagnosis not present

## 2020-05-26 NOTE — Telephone Encounter (Signed)
Approved. This drug has been approved. Approved quantity: 10 <> per 30 day(s). You may fill up to a 34 day supply at a retail pharmacy. You may fill up to a 90 day supply for maintenance drugs, please refer to the formulary for details. Please call the pharmacy to process your prescription claim.

## 2020-05-26 NOTE — Telephone Encounter (Signed)
Submitted PA Ubrelvy on [facility]. Key: [id number]. Waiting on determination from [facility].

## 2020-05-27 LAB — NEUROMYELITIS OPTICA AUTOAB, IGG: NMO IgG Autoantibodies: <1.5 U/mL (ref 0.0–3.0)

## 2020-05-27 LAB — ANTI-MOG, SERUM: MOG Antibody, Cell-based IFA: NEGATIVE

## 2020-05-31 DIAGNOSIS — Z419 Encounter for procedure for purposes other than remedying health state, unspecified: Secondary | ICD-10-CM | POA: Diagnosis not present

## 2020-06-06 ENCOUNTER — Other Ambulatory Visit: Payer: Self-pay | Admitting: *Deleted

## 2020-06-06 MED ORDER — LAMOTRIGINE 25 MG PO TABS: ORAL_TABLET | ORAL | 3 refills | Status: DC

## 2020-06-29 ENCOUNTER — Other Ambulatory Visit: Payer: Self-pay | Admitting: Neurology

## 2020-07-01 DIAGNOSIS — Z419 Encounter for procedure for purposes other than remedying health state, unspecified: Secondary | ICD-10-CM | POA: Diagnosis not present

## 2020-07-25 ENCOUNTER — Other Ambulatory Visit: Payer: Self-pay | Admitting: Registered Nurse

## 2020-07-25 ENCOUNTER — Other Ambulatory Visit: Payer: Self-pay | Admitting: Neurology

## 2020-07-31 DIAGNOSIS — Z419 Encounter for procedure for purposes other than remedying health state, unspecified: Secondary | ICD-10-CM | POA: Diagnosis not present

## 2020-08-01 ENCOUNTER — Other Ambulatory Visit: Payer: Self-pay | Admitting: Registered Nurse

## 2020-08-01 ENCOUNTER — Telehealth: Payer: Self-pay | Admitting: Family Medicine

## 2020-08-01 NOTE — Progress Notes (Signed)
08/02/2020 ALL: She continues to note benefit of Botox in migraine management. She continues Ubrelvy for abortive therapy. She continues follow up with Dr Epimenio Foot. Reports bilateral face, upper and lower extremity numbness on two separate occasions. No changes in MRI last performed 05/2020. She has follow up with Dr Epimenio Foot in 12/2020.    04/21/2020 ALL: She returns today for fourth Botox procedure. She tolerated last procedure well [name]). She does feel Botox is helping migraines. She does continue to have about 10 migraine days per month. She has failed sumatriptan and rizatriptan. She is taking gabapentin and indomethacin for back pain. Nurtec works well but not covered by [employer]. She has not tried [place]. 1 Nurtec sample and 2 Ubrelvy 50mg  samples given in office today.   She did have optic neuritis in March. MRI concerning for foci. LP performed and CSF normal. She is being followed by Dr April every 6 months with repeat imaging planned. She reports vision loss of left eye resolved. She has had some blurred/double vision bilaterally and is followed by Dr Epimenio Foot.    Consent Form Botulism Toxin Injection For Chronic Migraine    Reviewed orally with patient, additionally signature is on file:  Botulism toxin has been approved by the Federal drug administration for treatment of chronic migraine. Botulism toxin does not cure chronic migraine and it may not be effective in some patients.  The administration of botulism toxin is accomplished by injecting a small amount of toxin into the muscles of the neck and head. Dosage must be titrated for each individual. Any benefits resulting from botulism toxin tend to wear off after 3 months with a repeat injection required if benefit is to be maintained. Injections are usually done every 3-4 months with maximum effect peak achieved by about 2 or 3 weeks. Botulism toxin is expensive and you should be sure of what costs you will incur resulting from  the injection.  The side effects of botulism toxin use for chronic migraine may include:   -Transient, and usually mild, facial weakness with facial injections  -Transient, and usually mild, head or neck weakness with head/neck injections  -Reduction or loss of forehead facial animation due to forehead muscle weakness  -Eyelid drooping  -Dry eye  -Pain at the site of injection or bruising at the site of injection  -Double vision  -Potential unknown long term risks   Contraindications: You should not have Botox if you are pregnant, nursing, allergic to albumin, have an infection, skin condition, or muscle weakness at the site of the injection, or have myasthenia gravis, Lambert-Eaton syndrome, or ALS.  It is also possible that as with any injection, there may be an allergic reaction or no effect from the medication. Reduced effectiveness after repeated injections is sometimes seen and rarely infection at the injection site may occur. All care will be taken to prevent these side effects. If therapy is given over a long time, atrophy and wasting in the muscle injected may occur. Occasionally the patient's become refractory to treatment because they develop antibodies to the toxin. In this event, therapy needs to be modified.  I have read the above information and consent to the administration of botulism toxin.    BOTOX PROCEDURE NOTE FOR MIGRAINE HEADACHE  Contraindications and precautions discussed with patient(above). Aseptic procedure was observed and patient tolerated procedure. Procedure performed by Dione Booze, FNP-C.   The condition has existed for more than 6 months, and pt does not have a diagnosis of ALS,  Myasthenia Gravis or Lambert-Eaton Syndrome.  Risks and benefits of injections discussed and pt agrees to proceed with the procedure.  Written consent obtained  These injections are medically necessary. Pt  receives good benefits from these injections. These injections do not  cause sedations or hallucinations which the oral therapies may cause.   Description of procedure:  The patient was placed in a sitting position. The standard protocol was used for Botox as follows, with 5 units of Botox injected at each site:  -Procerus muscle, midline injection  -Corrugator muscle, bilateral injection  -Frontalis muscle, bilateral injection, with 2 sites each side, medial injection was performed in the upper one third of the frontalis muscle, in the region vertical from the medial inferior edge of the superior orbital rim. The lateral injection was again in the upper one third of the forehead vertically above the lateral limbus of the cornea, 1.5 cm lateral to the medial injection site.  -Temporalis muscle injection, 4 sites, bilaterally. The first injection was 3 cm above the tragus of the ear, second injection site was 1.5 cm to 3 cm up from the first injection site in line with the tragus of the ear. The third injection site was 1.5-3 cm forward between the first 2 injection sites. The fourth injection site was 1.5 cm posterior to the second injection site. 5th site laterally in the temporalis  muscleat the level of the outer canthus.  -Occipitalis muscle injection, 3 sites, bilaterally. The first injection was done one half way between the occipital protuberance and the tip of the mastoid process behind the ear. The second injection site was done lateral and superior to the first, 1 fingerbreadth from the first injection. The third injection site was 1 fingerbreadth superiorly and medially from the first injection site.  -Cervical paraspinal muscle injection, 2 sites, bilaterally. The first injection site was 1 cm from the midline of the cervical spine, 3 cm inferior to the lower border of the occipital protuberance. The second injection site was 1.5 cm superiorly and laterally to the first injection site.  -Trapezius muscle injection was performed at 3 sites, bilaterally.  The first injection site was in the upper trapezius muscle halfway between the inflection point of the neck, and the acromion. The second injection site was one half way between the acromion and the first injection site. The third injection was done between the first injection site and the inflection point of the neck.   Will return for repeat injection in 3 months.   A total of 200 units of Botox was prepared, 155 units of Botox was injected as documented above, any Botox not injected was wasted. The patient tolerated the procedure well, there were no complications of the above procedure.  agree with procedure as stated.     Naomie Dean, MD [facility]

## 2020-08-01 NOTE — Telephone Encounter (Signed)
Patient has a Botox appointment tomorrow. I called [employer] [phone]) and spoke with Pam to check codes [phone] and [id number]. Pam states no PA is required for [address], but she had to transfer me to the pharmacy to check [phone]. I spoke with Kieth Brightly in the pharmacy and she advised that no PA is required for J0585. Reference #[id number].

## 2020-08-02 ENCOUNTER — Ambulatory Visit: Payer: Medicaid Other | Admitting: Family Medicine

## 2020-08-02 DIAGNOSIS — G43709 Chronic migraine without aura, not intractable, without status migrainosus: Secondary | ICD-10-CM

## 2020-08-02 NOTE — Progress Notes (Signed)
Botox- 100 units x 2 vial Lot: [id number] Expiration: 3/24 NDC: 3818-2993-71  Bacteriostatic 0.9% Sodium Chloride- 60mL total [id number] Expiration: 2/23 NDC: 17510-258-52  Dx: G43.709 BB

## 2020-08-11 ENCOUNTER — Telehealth: Payer: Self-pay | Admitting: *Deleted

## 2020-08-11 NOTE — Telephone Encounter (Signed)
PA denied. Faxed appeal letter to [employer]/appeals department at [phone]. Received fax confirmation. Waiting on determination.

## 2020-08-11 NOTE — Telephone Encounter (Signed)
Submitted PA Ubrelvy on [facility]. Key: [id number]. Waiting on determination from [facility].

## 2020-08-12 NOTE — Telephone Encounter (Signed)
PA approved 08/11/20-04/01/2038. QA#[id number].

## 2020-08-25 ENCOUNTER — Other Ambulatory Visit: Payer: Self-pay | Admitting: Registered Nurse

## 2020-08-25 DIAGNOSIS — F411 Generalized anxiety disorder: Secondary | ICD-10-CM

## 2020-08-25 NOTE — Telephone Encounter (Signed)
LFD 04/11/20 #30 with 1 refill LOV 04/11/20 NOV none

## 2020-08-31 DIAGNOSIS — Z419 Encounter for procedure for purposes other than remedying health state, unspecified: Secondary | ICD-10-CM | POA: Diagnosis not present

## 2020-09-05 ENCOUNTER — Emergency Department (HOSPITAL_COMMUNITY)
Admission: EM | Admit: 2020-09-05 | Discharge: 2020-09-05 | Disposition: A | Discharge status: Home / Self Care | Payer: Medicaid Other | Attending: Emergency Medicine | Admitting: Emergency Medicine

## 2020-09-05 DIAGNOSIS — Z5321 Procedure and treatment not carried out due to patient leaving prior to being seen by health care provider: Secondary | ICD-10-CM | POA: Insufficient documentation

## 2020-09-05 DIAGNOSIS — T782XXA Anaphylactic shock, unspecified, initial encounter: Secondary | ICD-10-CM | POA: Diagnosis not present

## 2020-09-05 DIAGNOSIS — R0602 Shortness of breath: Secondary | ICD-10-CM | POA: Diagnosis present

## 2020-09-05 DIAGNOSIS — J029 Acute pharyngitis, unspecified: Secondary | ICD-10-CM | POA: Insufficient documentation

## 2020-09-05 NOTE — ED Notes (Signed)
Pt stated that she would be leaving AMA.

## 2020-09-05 NOTE — ED Provider Notes (Signed)
I briefly evaluated patient due to concern for anaphylaxis.  She took a dose of cephalexin 20 minutes ago and several minutes later started feeling a sore throat and felt short of breath and was concerned about anaphylaxis.  During my evaluation patient informing she would like to leave AMA.  I recommended that she stay for full evaluation but she declined.  I informed patient that without full evaluation I cannot exclude emergent condition she states she understands this but would like to leave AGAINST MEDICAL ADVICE.    -------------- Patient wishes to leave AGAINST MEDICAL ADVICE. I personally explained need for further testing and my concerns for adverse outcomes if workup is incomplete. Specific concerns explained to patient include worsening symptoms, functional loss, long term sickness and death. Patient states understanding of risks and states they will return if they feel the need to at a later date to receive the recommended care or any other care at any time, regardless of their ability to pay for such care. Patient understands they are able to return at any time. Patient is able to explain back the risks of leaving AMA and still wishes to leave.   The patient is oriented to person, place, and time, has the capacity to make decisions regarding the medical care offered. The patient speaks coherently and exhibits no evidence of having an altered level of consciousness or alcohol or drug intoxication to a point that would impair judgment.    Gailen Shelter, [place] 09/05/20 2228    Jacalyn Lefevre, MD 09/05/20 2251

## 2020-09-08 ENCOUNTER — Encounter: Payer: Self-pay | Admitting: Neurology

## 2020-09-08 ENCOUNTER — Ambulatory Visit: Payer: Medicaid Other | Admitting: Neurology

## 2020-09-08 VITALS — BP 106/74 | HR 79 | Ht 61.0 in | Wt 121.0 lb

## 2020-09-08 DIAGNOSIS — H469 Unspecified optic neuritis: Secondary | ICD-10-CM

## 2020-09-08 DIAGNOSIS — R9082 White matter disease, unspecified: Secondary | ICD-10-CM

## 2020-09-08 DIAGNOSIS — M791 Myalgia, unspecified site: Secondary | ICD-10-CM

## 2020-09-08 DIAGNOSIS — G43709 Chronic migraine without aura, not intractable, without status migrainosus: Secondary | ICD-10-CM | POA: Diagnosis not present

## 2020-09-08 NOTE — Progress Notes (Signed)
[facility]  PATIENT: Tracy Mcconnell DOB: [birth year]  REFERRING DOCTOR OR PCP: Dr. Lucia Gaskins and neurology);  PCP is [facility] SOURCE: Patient, notes from neurology and hospital, imaging and lab reports, MRI images personally reviewed  _________________________________   HISTORICAL  CHIEF COMPLAINT:  Chief Complaint  Patient presents with   Follow-up    RM 12, alone. ER follow up. Went to [facility] 09/05/20 d/t full body numbness. Also reports decreased taste. Some things tase funny to her. Bacon smells rancid to her. Did home covid test 09/05/20, it was negative. No other covid sx.    HISTORY OF PRESENT ILLNESS:  Tracy Mcconnell is a 25 y.o. 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   She had just take cephalexin before symptoms started and she noted facial swelling and went to the ED. [name]   She notes change in her ability to taste and smell.   Some odors seem different and others reduced.    She is having whole body spasms.    Her neck seems stiff.   No new numbness.  Migraines are doing much better since she had Botox (08/02/2020)   She is now just having a few.   Nurtec works better than [place] but insurance will not cover it.    Bright lights or sunlight triggers a migraine.   Two headaches the past week had N/V.    She has no new visual changes.   She had left ON but now vision seems more symmetric.  Currently, vision is brighter on her left.      She is also noting sciatic nerve pain  She is on gabapentin and prn flexeril.   Lamotrigine had been added earlier this year.      MRI images from February 2022 showed no new lesions.  CSF had been normal.  The spinal cord was normal.  There is no evidence of new optic neuritis on her last MRI.  She reports the left vision form the 2021 left optic neuriti has improved but not to baseline.   Colors are still desaturated on that side.   She  can read out of that eye.   She was having migraine headaches about 15 days a month.  Nurtec helps the most when one occurs.   She is getting Botox (Amy Nicholas Lose, last one in April) with benefit now.  It helps more than Ajovy did.       She has had her Covid-19 vaccinations.     Optic Neuritis History: In February 2021, she had the onset of left eye blurry vision and pain, worse with eye movements.    She saw urgent care and then ophthalmology   She was felt to have optic neuritis and presented to the [facility] ED.   MRI of the brain and orbits showed enhancement of the left optic nerve,   There were a few T2 hyperintense foci in the hemispheres, including a periventricular focus in the right frontal lobe and one juxtacortical focus in the right parietal lobe.  None of the brain foci enhanced.  In retrospect, she has numbness and weakness in her legs that lasted a week, left > right in November 2019.   She had an LP 07/01/2019 and CSF showed no OCB and normal IgG Index   Imaging: MRI of the brain 05/23/2020 showed scattered T2/FLAIR hyperintense foci including 2 in the periventricular white matter and 1  in the juxtacortical white matter.  None of the foci enhance.  Compared to the MRI of the brain from 12/24/2019 and 06/10/2019, there are no new lesions.  MRI of the cervical spine 05/23/2020 showed a normal spinal cord.  No degenerative changes.  MRI of the orbits 05/23/2020 showed no enhancing lesions.  There was slight hyperintensity of the left optic nerve consistent with the previous optic neuritis.  Her maternal aunt and two second cousins have MS.    REVIEW OF SYSTEMS: Constitutional: No fevers, chills, sweats, or change in appetite Eyes: No visual changes, double vision, eye pain Ear, nose and throat: No hearing loss, ear pain, nasal congestion, sore throat Cardiovascular: No chest pain, palpitations Respiratory:  No shortness of breath at rest or with exertion.   No wheezes GastrointestinaI:  No nausea, vomiting, diarrhea, abdominal pain, fecal incontinence Genitourinary:  No dysuria, urinary retention or frequency.  No nocturia. Musculoskeletal:  No neck pain, back pain Integumentary: No rash, pruritus, skin lesions Neurological: as above Psychiatric: No depression at this time.  No anxiety Endocrine: No palpitations, diaphoresis, change in appetite, change in weigh or increased thirst Hematologic/Lymphatic:  No anemia, purpura, petechiae. Allergic/Immunologic: No itchy/runny eyes, nasal congestion, recent allergic reactions, rashes  ALLERGIES: Allergies  Allergen Reactions   Sumatriptan Shortness Of Breath and Other (See Comments)    Entire body "felt heavy"   Other Itching, Swelling and Other (See Comments)    Pesticides (family history)- Exposure face and mouth itch and the tongue/lips swell    HOME MEDICATIONS:  Current Outpatient Medications:    acetaminophen (TYLENOL) 325 MG tablet, Take 2 tablets (650 mg total) by mouth every 6 (six) hours as needed for mild pain (or temp > 100)., Disp: , Rfl:    Botulinum Toxin Type A (BOTOX) 200 units SOLR, Inject 155 units intramuscularly into head and neck muscles every 3 months by provider. (Patient taking differently: Inject 150 Units into the muscle See admin instructions. Inject 155 units intramuscularly into head and neck muscles every 3 months by provider.), Disp: 1 each, Rfl: 3   buPROPion (WELLBUTRIN XL) 300 MG 24 hr tablet, TAKE 1 TABLET BY MOUTH EVERY DAY IN THE MORNING, Disp: 90 tablet, Rfl: 1   busPIRone (BUSPAR) 30 MG tablet, Take 1 tablet (30 mg total) by mouth 2 (two) times daily., Disp: 90 tablet, Rfl: 3   cyclobenzaprine (FLEXERIL) 5 MG tablet, Take 1 tablet (5 mg total) by mouth 3 (three) times daily as needed for muscle spasms., Disp: 30 tablet, Rfl: 1   ergocalciferol (VITAMIN D2) 1.25 MG (50000 UT) capsule, Take 50,000 Units by mouth every Friday. , Disp: , Rfl:    FLUoxetine (PROZAC) 40 MG capsule, Take 1  capsule (40 mg total) by mouth in the morning., Disp: 90 capsule, Rfl: 3   gabapentin (NEURONTIN) 600 MG tablet, TAKE 1 TABLET (600 MG TOTAL) BY MOUTH IN THE MORNING, AT NOON, IN THE EVENING, AND AT BEDTIME., Disp: 120 tablet, Rfl: 5   indomethacin (INDOCIN) 25 MG capsule, TAKE 1 CAPSULE (25 MG TOTAL) BY MOUTH DAILY AS NEEDED., Disp: 30 capsule, Rfl: 2   lamoTRIgine (LAMICTAL) 25 MG tablet, Take 1 tablet at night for 5 days, then 1 in the morning and 1 tablet at night for 5 days, then 1 in the morning and 2 tablets at night for 5 days. Then you can go to 2 tablets in the morning and 2 tablets in the evening thereafter, Disp: 120 tablet, Rfl: 3   LAVENDER OIL  PO, Take 1 capsule by mouth daily., Disp: , Rfl:    LORazepam (ATIVAN) 1 MG tablet, TAKE 1 TABLET BY MOUTH EVERY 6 HOURS AS NEEDED FOR ANXIETY., Disp: 30 tablet, Rfl: 1   magnesium oxide (MAG-OX) 400 MG tablet, Take 400 mg by mouth daily., Disp: , Rfl:    naproxen (NAPROSYN) 500 MG tablet, Take 1 tablet (500 mg total) by mouth 2 (two) times daily with a meal., Disp: 30 tablet, Rfl: 0   omeprazole (PRILOSEC) 20 MG capsule, Take 1 capsule (20 mg total) by mouth daily., Disp: 30 capsule, Rfl: 3   QUEtiapine (SEROQUEL) 50 MG tablet, TAKE 1/2 - 1 TABLET BY MOUTH AT BEDTIME FOR SLEEP., Disp: 90 tablet, Rfl: 0   Ubrogepant (UBRELVY) 100 MG TABS, Take 100 mg by mouth daily as needed. Take one tablet at onset of headache, may repeat 1 tablet in 2 hours, no more than 2 tablets in 24 hours, Disp: 10 tablet, Rfl: 11  PAST MEDICAL HISTORY: Past Medical History:  Diagnosis Date   Anemia    Anxiety    Depression    GERD (gastroesophageal reflux disease)    Hypovitaminosis D 04/07/2019   Left eye complaint 06/08/2019   Migraines     PAST SURGICAL HISTORY: Past Surgical History:  Procedure Laterality Date   CHOLECYSTECTOMY N/A 10/19/2019   Procedure: LAPAROSCOPIC CHOLECYSTECTOMY;  Surgeon: Berna Bue, MD;  Location: [facility];  Service: General;   Laterality: N/A;   LAPAROSCOPIC APPENDECTOMY N/A 01/17/2020   Procedure: APPENDECTOMY LAPAROSCOPIC;  Surgeon: Harriette Bouillon, MD;  Location: [facility];  Service: General;  Laterality: N/A;    FAMILY HISTORY: Family History  Problem Relation Age of Onset   Migraines Mother    Thyroid disease Mother    Cancer Maternal Grandmother    Diabetes Paternal Grandfather    Hypertension Maternal Grandfather    Diabetes Paternal Grandmother    Stroke Paternal Grandmother     SOCIAL HISTORY:  Social History   Socioeconomic History   Marital status: Significant Other    Spouse name: Not on file   Number of children: 0   Years of education: college student   Highest education level: Not on file  Occupational History   Not on file  Tobacco Use   Smoking status: Never   Smokeless tobacco: Never  Vaping Use   Vaping Use: Never used  Substance and Sexual Activity   Alcohol use: No   Drug use: No   Sexual activity: Not Currently  Other Topics Concern   Not on file  Social History Narrative   Lives at home alone   Right handed   Does not drink caffeine, makes her nauseated   Social Determinants of Health   Financial Resource Strain: Not on file  Food Insecurity: Not on file  Transportation Needs: Not on file  Physical Activity: Not on file  Stress: Not on file  Social Connections: Not on file  Intimate Partner Violence: Not on file     PHYSICAL EXAM  Vitals:   09/08/20 1254  BP: 106/74  Pulse: 79  Weight: 121 lb (54.9 kg)  Height: 5\' 1"  (1.549 m)    Body mass index is 22.86 kg/m.  No results found.  General: The patient is well-developed and well-nourished and in no acute distress  HEENT:  Head is /AT.  Sclera are anicteric.  Funduscopic exam shows normal optic discs and retinal vessels.  Skin/extremities: Extremities are without rash or  edema.  She has tenderness over  the piriformis muscle in the right buttock.   Neurologic Exam  Mental status: The patient  is alert and oriented x 3 at the time of the examination. The patient has apparent normal recent and remote memory, with an apparently normal attention span and concentration ability.   Speech is normal.  Cranial nerves: Extraocular movements are full. Pupils show 1+ left APD.   She has reduced color vision OS.  Facial strength was normal and symmetric.[name] No obvious hearing deficits are noted.  Motor:  Muscle bulk is normal.   Tone is normal. Strength is  5 / 5 in all 4 extremities.   Sensory: She reports reduced/altered sensation in the bilateral arms, legs and bilateral face.  Coordination: Cerebellar testing reveals good finger-nose-finger and heel-to-shin bilaterally.  Gait and station: Station is normal.   Gait is normal. Tandem gait is normal.  Romberg is negative.  Reflexes: Deep tendon reflexes are symmetric and normal bilaterally (2 in the arms and 3 in the legs).   P    DIAGNOSTIC DATA (LABS, IMAGING, TESTING) - I reviewed patient records, labs, notes, testing and imaging myself where available.  Lab Results  Component Value Date   WBC 12.5 (H) 05/23/2020   HGB 13.9 05/23/2020   HCT 41.3 05/23/2020   MCV 87.3 05/23/2020   PLT 419 (H) 05/23/2020      Component Value Date/Time   NA 135 05/23/2020 0053   NA 140 12/28/2019 1655   K 3.7 05/23/2020 0053   CL 103 05/23/2020 0053   CO2 22 05/23/2020 0053   GLUCOSE 108 (H) 05/23/2020 0053   BUN 13 05/23/2020 0053   BUN 9 12/28/2019 1655   CREATININE 0.67 05/23/2020 0053   CALCIUM 9.6 05/23/2020 0053   PROT 6.6 05/23/2020 0053   PROT 7.5 12/28/2019 1655   ALBUMIN 4.0 05/23/2020 0053   ALBUMIN 4.9 12/28/2019 1655   ALBUMIN 4.2 07/01/2019 0919   AST 19 05/23/2020 0053   ALT 17 05/23/2020 0053   ALKPHOS 77 05/23/2020 0053   BILITOT 0.5 05/23/2020 0053   BILITOT 0.3 12/28/2019 1655   GFRNONAA >60 05/23/2020 0053   GFRAA 138 12/28/2019 1655   No results found for: CHOL, HDL, LDLCALC, LDLDIRECT, TRIG, CHOLHDL Lab Results   Component Value Date   HGBA1C 5.2 12/28/2019   Lab Results  Component Value Date   VITAMINB12 553 06/17/2019   Lab Results  Component Value Date   TSH 0.494 12/28/2019       ASSESSMENT AND PLAN  Optic neuritis - Plan: MR BRAIN W WO CONTRAST, CANCELED: MR BRAIN W WO CONTRAST  Chronic migraine without aura without status migrainosus, not intractable  White matter abnormality on MRI of brain  Myalgia  1.   Current symptoms do not appear to be an exacerbation.  She has had myalgias in the past.  Changes in smell and taste are not typical MS related symptoms.  I would be concerned about upper respiratory infection as an etiology. 2.   We discussed the association of ON (she had in Feb 2021) with MS.  As she has > 3 lesions in her head, the likelihood of eventual MS diagnosis is > 50%, likely around 75%.   CSF did not show oligoclonal bands and follow-up MRI had not shown new lesions.  This will have to continue to be followed closely over the next couple of years.  I will want to check another MRI of the brain this summer to determine if there has been subclinical  progression. 3.   She could have a mild piriformis syndrome on the right.  I instructed her on piriformis exercises and she will check on YouTube as well.   4.   Stay active exercise as tolerated. 5.   Return in 6 months or sooner if there are new or worsening neurologic symptoms.   Mayan Kloepfer A. Epimenio FootSater, MD, [facility],FAAN 09/08/2020, 5:40 PM Certified in Neurology, Clinical Neurophysiology, Sleep Medicine and Neuroimaging  [facility] [address], [address] [place], [place] [address] [phone]

## 2020-09-08 NOTE — Patient Instructions (Signed)
Look at [facility] for piriformis stretch exercises

## 2020-09-09 ENCOUNTER — Other Ambulatory Visit: Payer: Self-pay

## 2020-09-09 ENCOUNTER — Ambulatory Visit (HOSPITAL_COMMUNITY)
Admission: EM | Admit: 2020-09-09 | Discharge: 2020-09-10 | Disposition: A | Discharge status: Other Acute Inpatient Hospital | Payer: Medicaid Other | Attending: Urology | Admitting: Urology

## 2020-09-09 DIAGNOSIS — Z79899 Other long term (current) drug therapy: Secondary | ICD-10-CM | POA: Insufficient documentation

## 2020-09-09 DIAGNOSIS — F332 Major depressive disorder, recurrent severe without psychotic features: Secondary | ICD-10-CM | POA: Insufficient documentation

## 2020-09-09 DIAGNOSIS — F411 Generalized anxiety disorder: Secondary | ICD-10-CM | POA: Insufficient documentation

## 2020-09-09 DIAGNOSIS — Z20822 Contact with and (suspected) exposure to covid-19: Secondary | ICD-10-CM | POA: Insufficient documentation

## 2020-09-09 LAB — CBC WITH DIFFERENTIAL/PLATELET
Abs Immature Granulocytes: 0.04 10*3/uL (ref 0.00–0.07)
Basophils Absolute: 0 10*3/uL (ref 0.0–0.1)
Basophils Relative: 0 %
Eosinophils Absolute: 0.2 10*3/uL (ref 0.0–0.5)
Eosinophils Relative: 2 %
HCT: 41.3 % (ref 36.0–46.0)
Hemoglobin: 13.8 g/dL (ref 12.0–15.0)
Immature Granulocytes: 0 %
Lymphocytes Relative: 27 %
Lymphs Abs: 3.3 10*3/uL (ref 0.7–4.0)
MCH: 30.1 pg (ref 26.0–34.0)
MCHC: 33.4 g/dL (ref 30.0–36.0)
MCV: 90.2 fL (ref 80.0–100.0)
Monocytes Absolute: 0.8 10*3/uL (ref 0.1–1.0)
Monocytes Relative: 7 %
Neutro Abs: 7.8 10*3/uL — ABNORMAL HIGH (ref 1.7–7.7)
Neutrophils Relative %: 64 %
Platelets: 407 10*3/uL — ABNORMAL HIGH (ref 150–400)
RBC: 4.58 MIL/uL (ref 3.87–5.11)
RDW: 12.5 % (ref 11.5–15.5)
WBC: 12.2 10*3/uL — ABNORMAL HIGH (ref 4.0–10.5)
nRBC: 0 % (ref 0.0–0.2)

## 2020-09-09 LAB — POCT URINE DRUG SCREEN - MANUAL ENTRY (I-SCREEN)
POC Amphetamine UR: NOT DETECTED
POC Buprenorphine (BUP): NOT DETECTED
POC Cocaine UR: NOT DETECTED
POC Marijuana UR: POSITIVE — AB
POC Methadone UR: NOT DETECTED
POC Methamphetamine UR: NOT DETECTED
POC Morphine: POSITIVE — AB
POC Oxazepam (BZO): NOT DETECTED
POC Oxycodone UR: POSITIVE — AB
POC Secobarbital (BAR): NOT DETECTED

## 2020-09-09 LAB — POC SARS CORONAVIRUS 2 AG: SARSCOV2ONAVIRUS 2 AG: NEGATIVE

## 2020-09-09 LAB — POCT PREGNANCY, URINE: Preg Test, Ur: NEGATIVE

## 2020-09-09 MED ORDER — ACETAMINOPHEN 325 MG PO TABS
650.0000 mg | ORAL_TABLET | Freq: Four times a day (QID) | ORAL | Status: DC | PRN
  Administered 2020-09-10: 09:00:00 650 mg via ORAL
  Filled 2020-09-09: qty 2

## 2020-09-09 MED ORDER — TRAZODONE HCL 50 MG PO TABS: 50.0000 mg | ORAL_TABLET | Freq: Every evening | ORAL | Status: DC | PRN

## 2020-09-09 MED ORDER — ALUM & MAG HYDROXIDE-SIMETH 200-200-20 MG/5ML PO SUSP: 30.0000 mL | ORAL | Status: DC | PRN

## 2020-09-09 MED ORDER — BUPROPION HCL ER (XL) 300 MG PO TB24
300.0000 mg | ORAL_TABLET | Freq: Every day | ORAL | Status: DC
  Administered 2020-09-10: 300 mg via ORAL
  Filled 2020-09-09: qty 1

## 2020-09-09 MED ORDER — MAGNESIUM HYDROXIDE 400 MG/5ML PO SUSP: 30.0000 mL | Freq: Every day | ORAL | Status: DC | PRN

## 2020-09-09 MED ORDER — QUETIAPINE FUMARATE 25 MG PO TABS
25.0000 mg | ORAL_TABLET | Freq: Every day | ORAL | Status: DC
  Administered 2020-09-09: 25 mg via ORAL
  Filled 2020-09-09: qty 1

## 2020-09-09 MED ORDER — FLUOXETINE HCL 20 MG PO CAPS
40.0000 mg | ORAL_CAPSULE | Freq: Every morning | ORAL | Status: DC
  Administered 2020-09-10: 40 mg via ORAL
  Filled 2020-09-09: qty 2

## 2020-09-09 MED ORDER — HYDROXYZINE HCL 25 MG PO TABS
25.0000 mg | ORAL_TABLET | Freq: Three times a day (TID) | ORAL | Status: DC | PRN
  Administered 2020-09-09: 23:00:00 25 mg via ORAL
  Filled 2020-09-09: qty 1

## 2020-09-09 MED ORDER — BUSPIRONE HCL 15 MG PO TABS
30.0000 mg | ORAL_TABLET | Freq: Two times a day (BID) | ORAL | Status: DC
  Administered 2020-09-09 – 2020-09-10 (×2): 30 mg via ORAL
  Filled 2020-09-09 (×2): qty 2

## 2020-09-09 NOTE — ED Provider Notes (Signed)
Behavioral Health Admission H&P [name] & OBS)  Date: 09/10/20 Patient Name: Tracy Mcconnell MRN: [id number] Chief Complaint:  Chief Complaint  Patient presents with   Anxiety      Diagnoses:  Final diagnoses:  MDD (major depressive disorder), recurrent severe, without psychosis (HCC)  Generalized anxiety disorder    HPI: Tracy Mcconnell patient is a 25y/o patient [facility] voluntarily. Patient presented with chief complaint of worsening depression, social anxiety, feeling overwhelmed, and suicidal ideation with thoughts of jumping off the third floor however she states that she feels too exhausted and tired to carry it out.  Patient reports  multiple panic attacks despite compliance with psychotropic medications. She reports that "If I don't take my medications I can't get out of bed." She report that she constantly bit her nails, have tremors, and inability to meet new people or leave her house due to anxiety. She is endorsing depressive symptoms of fatigue, hopelessness, feeling empty, irritable, lack of appetite, worthlessness, and panic attack.  Patient was assessed by this NP. Patient is alert and oriented, calm and cooperative. Patient denies any headache, nausea, vomiting, dizziness, chest pain, SOB, abdominal pain, diarrhea, and constipation.  Patient endorses passive SI and report that she is unsafe to contract for safety. She denies HI, paranoia, AVH, and no delusion noted.   PHQ 2-9:  Flowsheet Row Office Visit from 06/17/2019 in Primary Care at [facility] Visit from 06/05/2019 in Primary Care at [facility] Visit from 04/07/2019 in Primary Care at [facility]  Thoughts that you would be better off dead, or of hurting yourself in some way Not at all Not at all Not at all  PHQ-9 Total Score 0 0 12       Flowsheet Row ED from 09/09/2020 in [facility] ED from 04/25/2020 in [facility] at [facility] RISK CATEGORY  High Risk No Risk        Total Time spent with patient: 30 minutes  Musculoskeletal  Strength & Muscle Tone: within normal limits Gait & Station: normal Patient leans: Right  Psychiatric Specialty Exam  Presentation General Appearance: Appropriate for Environment  Eye Contact:Minimal  Speech:Clear and Coherent  Speech Volume:Decreased  Handedness:Right   Mood and Affect  Mood:Anxious; Depressed  Affect:Congruent; Depressed   Thought Process  Thought Processes:Coherent  Descriptions of Associations:Intact  Orientation:Full (Time, Place and Person)  Thought Content:WDL    Hallucinations:Hallucinations: None  Ideas of Reference:None  Suicidal Thoughts:Suicidal Thoughts: Yes, Passive SI Passive Intent and/or Plan: Without Plan  Homicidal Thoughts:Homicidal Thoughts: No   Sensorium  Memory:Immediate Good; Recent Good; Remote Good  Judgment:Good  Insight:No data recorded  Executive Functions  Concentration:Good  Attention Span:Good  Recall:Good  Fund of Knowledge:Good  Language:Good   Psychomotor Activity  Psychomotor Activity:Psychomotor Activity: Normal   Assets  Assets:Communication Skills; Desire for Improvement; Transportation; Housing   Sleep  Sleep:Sleep: Fair Number of Hours of Sleep: 6   Nutritional Assessment (For OBS and FBC admissions only) Has the patient had a weight loss or gain of 10 pounds or more in the last 3 months?: No Has the patient had a decrease in food intake/or appetite?: Yes Does the patient have dental problems?: No Does the patient have eating habits or behaviors that may be indicators of an eating disorder including binging or inducing vomiting?: No Has the patient recently lost weight without trying?: Yes, 2-13 lbs. Has the patient been eating poorly because of a decreased appetite?: No Malnutrition Screening Tool  Score: 1   Physical Exam Vitals and nursing note reviewed.  Constitutional:       General: She is not in acute distress.    Appearance: She is well-developed.  HENT:     Head: Normocephalic and atraumatic.  Eyes:     Conjunctiva/sclera: Conjunctivae normal.  Cardiovascular:     Rate and Rhythm: Normal rate and regular rhythm.     Heart sounds: No murmur heard. Pulmonary:     Effort: Pulmonary effort is normal. No respiratory distress.     Breath sounds: Normal breath sounds.  Abdominal:     Palpations: Abdomen is soft.     Tenderness: There is no abdominal tenderness.  Musculoskeletal:     Cervical back: Neck supple.  Skin:    General: Skin is warm and dry.  Neurological:     Mental Status: She is alert and oriented to person, place, and time.  Psychiatric:        Attention and Perception: Attention normal. She does not perceive auditory or visual hallucinations.        Mood and Affect: Mood normal.        Speech: Speech normal.        Behavior: Behavior normal. Behavior is cooperative.        Thought Content: Thought content is not paranoid or delusional. Thought content includes suicidal ideation. Thought content does not include homicidal ideation. Thought content includes suicidal plan. Thought content does not include homicidal plan.   Review of Systems  Constitutional: Negative.   HENT: Negative.    Eyes: Negative.   Respiratory: Negative.    Cardiovascular: Negative.   Gastrointestinal: Negative.   Genitourinary: Negative.   Musculoskeletal: Negative.   Skin: Negative.   Neurological: Negative.   Endo/Heme/Allergies: Negative.   Psychiatric/Behavioral:  Positive for depression and suicidal ideas. Negative for hallucinations and substance abuse. The patient is nervous/anxious.    Blood pressure 118/84, pulse 72, temperature 98.2 F (36.8 C), temperature source Oral, resp. rate 16, SpO2 100 %. There is no height or weight on file to calculate BMI.  Past Psychiatric History: depression, social anxiety   Is the patient at risk to self? Yes  Has  the patient been a risk to self in the past 6 months? No .    Has the patient been a risk to self within the distant past? No   Is the patient a risk to others? No   Has the patient been a risk to others in the past 6 months? No   Has the patient been a risk to others within the distant past? No   Past Medical History:  Past Medical History:  Diagnosis Date   Anemia    Anxiety    Depression    GERD (gastroesophageal reflux disease)    Hypovitaminosis D 04/07/2019   Left eye complaint 06/08/2019   Migraines     Past Surgical History:  Procedure Laterality Date   CHOLECYSTECTOMY N/A 10/19/2019   Procedure: LAPAROSCOPIC CHOLECYSTECTOMY;  Surgeon: Berna Bue, MD;  Location: [facility];  Service: General;  Laterality: N/A;   LAPAROSCOPIC APPENDECTOMY N/A 01/17/2020   Procedure: APPENDECTOMY LAPAROSCOPIC;  Surgeon: Harriette Bouillon, MD;  Location: [facility];  Service: General;  Laterality: N/A;    Family History:  Family History  Problem Relation Age of Onset   Migraines Mother    Thyroid disease Mother    Cancer Maternal Grandmother    Diabetes Paternal Grandfather    Hypertension Maternal Grandfather  Diabetes Paternal Grandmother    Stroke Paternal Grandmother     Social History:  Social History   Socioeconomic History   Marital status: Significant Other    Spouse name: Not on file   Number of children: 0   Years of education: college student   Highest education level: Not on file  Occupational History   Not on file  Tobacco Use   Smoking status: Never   Smokeless tobacco: Never  Vaping Use   Vaping Use: Never used  Substance and Sexual Activity   Alcohol use: No   Drug use: No   Sexual activity: Not Currently  Other Topics Concern   Not on file  Social History Narrative   Lives at home alone   Right handed   Does not drink caffeine, makes her nauseated   Social Determinants of Corporate investment banker Strain: Not on file  Food Insecurity: Not on file   Transportation Needs: Not on file  Physical Activity: Not on file  Stress: Not on file  Social Connections: Not on file  Intimate Partner Violence: Not on file    SDOH:  SDOH Screenings   Alcohol Screen: Not on file  Depression (PHQ2-9): Low Risk    PHQ-2 Score: 0  Financial Resource Strain: Not on file  Food Insecurity: Not on file  Housing: Not on file  Physical Activity: Not on file  Social Connections: Not on file  Stress: Not on file  Tobacco Use: Low Risk    Smoking Tobacco Use: Never   Smokeless Tobacco Use: Never  Transportation Needs: Not on file    Last Labs:  Admission on 09/09/2020  Component Date Value Ref Range Status   SARS Coronavirus 2 by RT PCR 09/09/2020 NEGATIVE  NEGATIVE Final   Comment: (NOTE) SARS-CoV-2 target nucleic acids are NOT DETECTED.  The SARS-CoV-2 RNA is generally detectable in upper respiratory specimens during the acute phase of infection. The lowest concentration of SARS-CoV-2 viral copies this assay can detect is 138 copies/mL. A negative result does not preclude SARS-Cov-2 infection and should not be used as the sole basis for treatment or other patient management decisions. A negative result may occur with  improper specimen collection/handling, submission of specimen other than nasopharyngeal swab, presence of viral mutation(s) within the areas targeted by this assay, and inadequate number of viral copies(<138 copies/mL). A negative result must be combined with clinical observations, patient history, and epidemiological information. The expected result is Negative.  Fact Sheet for Patients:  BloggerCourse.com  Fact Sheet for Healthcare Providers:  SeriousBroker.it  This test is no                          t yet approved or cleared by the [place] FDA and  has been authorized for detection and/or diagnosis of SARS-CoV-2 by FDA under an Emergency Use Authorization (EUA).  This EUA will remain  in effect (meaning this test can be used) for the duration of the COVID-19 declaration under Section 564(b)(1) of the Act, 21 U.S.C.section 360bbb-3(b)(1), unless the authorization is terminated  or revoked sooner.       Influenza A by PCR 09/09/2020 NEGATIVE  NEGATIVE Final   Influenza B by PCR 09/09/2020 NEGATIVE  NEGATIVE Final   Comment: (NOTE) The Xpert Xpress SARS-CoV-2/FLU/RSV plus assay is intended as an aid in the diagnosis of influenza from Nasopharyngeal swab specimens and should not be used as a sole basis for treatment. Nasal washings  and aspirates are unacceptable for Xpert Xpress SARS-CoV-2/FLU/RSV testing.  Fact Sheet for Patients: BloggerCourse.comhttps://www.fda.gov/media/152166/download  Fact Sheet for Healthcare Providers: SeriousBroker.ithttps://www.fda.gov/media/152162/download  This test is not yet approved or cleared by the [place] FDA and has been authorized for detection and/or diagnosis of SARS-CoV-2 by FDA under an Emergency Use Authorization (EUA). This EUA will remain in effect (meaning this test can be used) for the duration of the COVID-19 declaration under Section 564(b)(1) of the Act, 21 U.S.C. section 360bbb-3(b)(1), unless the authorization is terminated or revoked.  Performed at [facility], [address] [place], [place] [address]    WBC 09/09/2020 12.2 (A) 4.0 - 10.5 K/uL Final   RBC 09/09/2020 4.58  3.87 - 5.11 MIL/uL Final   Hemoglobin 09/09/2020 13.8  12.0 - 15.0 g/dL Final   HCT 09/32/355706/12/2020 41.3  36.0 - 46.0 % Final   MCV 09/09/2020 90.2  80.0 - 100.0 fL Final   MCH 09/09/2020 30.1  26.0 - 34.0 pg Final   MCHC 09/09/2020 33.4  30.0 - 36.0 g/dL Final   RDW 32/20/254206/12/2020 12.5  11.5 - 15.5 % Final   Platelets 09/09/2020 407 (A) 150 - 400 K/uL Final   nRBC 09/09/2020 0.0  0.0 - 0.2 % Final   Neutrophils Relative % 09/09/2020 64  % Final   Neutro Abs 09/09/2020 7.8 (A) 1.7 - 7.7 K/uL Final   Lymphocytes Relative 09/09/2020 27  %  Final   Lymphs Abs 09/09/2020 3.3  0.7 - 4.0 K/uL Final   Monocytes Relative 09/09/2020 7  % Final   Monocytes Absolute 09/09/2020 0.8  0.1 - 1.0 K/uL Final   Eosinophils Relative 09/09/2020 2  % Final   Eosinophils Absolute 09/09/2020 0.2  0.0 - 0.5 K/uL Final   Basophils Relative 09/09/2020 0  % Final   Basophils Absolute 09/09/2020 0.0  0.0 - 0.1 K/uL Final   Immature Granulocytes 09/09/2020 0  % Final   Abs Immature Granulocytes 09/09/2020 0.04  0.00 - 0.07 K/uL Final   Performed at [facility], [address] [place], [place] [address]   Sodium 09/09/2020 138  135 - 145 mmol/L Final   Potassium 09/09/2020 3.3 (A) 3.5 - 5.1 mmol/L Final   Chloride 09/09/2020 104  98 - 111 mmol/L Final   CO2 09/09/2020 26  22 - 32 mmol/L Final   Glucose, Bld 09/09/2020 78  70 - 99 mg/dL Final   Glucose reference range applies only to samples taken after fasting for at least 8 hours.   BUN 09/09/2020 6  6 - 20 mg/dL Final   Creatinine, Ser 09/09/2020 0.78  0.44 - 1.00 mg/dL Final   Calcium 76/28/315106/12/2020 9.6  8.9 - 10.3 mg/dL Final   Total Protein 76/16/073706/12/2020 7.3  6.5 - 8.1 g/dL Final   Albumin 10/62/694806/12/2020 4.1  3.5 - 5.0 g/dL Final   AST 54/62/703506/12/2020 19  15 - 41 U/L Final   ALT 09/09/2020 14  0 - 44 U/L Final   Alkaline Phosphatase 09/09/2020 70  38 - 126 U/L Final   Total Bilirubin 09/09/2020 0.5  0.3 - 1.2 mg/dL Final   GFR, Estimated 09/09/2020 >60  >60 mL/min Final   Comment: (NOTE) Calculated using the CKD-EPI Creatinine Equation (2021)    Anion gap 09/09/2020 8  5 - 15 Final   Performed at [facility], [address] [place], [place] [address]   TSH 09/09/2020 2.000  0.350 - 4.500 uIU/mL Final   Comment: Performed by a  3rd Generation assay with a functional sensitivity of <=0.01 uIU/mL. Performed at [facility], [address], [place], [place] [address]    POC Amphetamine UR 09/09/2020 None Detected  NONE DETECTED (Cut Off Level 1000 ng/mL) Final   POC Secobarbital (BAR)  09/09/2020 None Detected  NONE DETECTED (Cut Off Level 300 ng/mL) Final   POC Buprenorphine (BUP) 09/09/2020 None Detected  NONE DETECTED (Cut Off Level 10 ng/mL) Final   POC Oxazepam (BZO) 09/09/2020 None Detected  NONE DETECTED (Cut Off Level 300 ng/mL) Final   POC Cocaine UR 09/09/2020 None Detected  NONE DETECTED (Cut Off Level 300 ng/mL) Final   POC Methamphetamine UR 09/09/2020 None Detected  NONE DETECTED (Cut Off Level 1000 ng/mL) Final   POC Morphine 09/09/2020 Positive (A) NONE DETECTED (Cut Off Level 300 ng/mL) Final   POC Oxycodone UR 09/09/2020 Positive (A) NONE DETECTED (Cut Off Level 100 ng/mL) Final   POC Methadone UR 09/09/2020 None Detected  NONE DETECTED (Cut Off Level 300 ng/mL) Final   POC Marijuana UR 09/09/2020 Positive (A) NONE DETECTED (Cut Off Level 50 ng/mL) Final   Preg Test, Ur 09/09/2020 NEGATIVE  NEGATIVE Final   Comment:        THE SENSITIVITY OF THIS METHODOLOGY IS >24 mIU/mL    SARSCOV2ONAVIRUS 2 AG 09/09/2020 NEGATIVE  NEGATIVE Final   Comment: (NOTE) SARS-CoV-2 antigen NOT DETECTED.   Negative results are presumptive.  Negative results do not preclude SARS-CoV-2 infection and should not be used as the sole basis for treatment or other patient management decisions, including infection  control decisions, particularly in the presence of clinical signs and  symptoms consistent with COVID-19, or in those who have been in contact with the virus.  Negative results must be combined with clinical observations, patient history, and epidemiological information. The expected result is Negative.  Fact Sheet for Patients: https://www.jennings-kim.com/  Fact Sheet for Healthcare Providers: https://alexander-rogers.biz/  This test is not yet approved or cleared by the [place] FDA and  has been authorized for detection and/or diagnosis of SARS-CoV-2 by FDA under an Emergency Use Authorization (EUA).  This EUA will remain in effect  (meaning this test can be used) for the duration of  the COV                          ID-19 declaration under Section 564(b)(1) of the Act, 21 U.S.C. section 360bbb-3(b)(1), unless the authorization is terminated or revoked sooner.     Cholesterol 09/09/2020 192  0 - 200 mg/dL Final   Triglycerides 45/62/5638 68  <150 mg/dL Final   HDL 93/73/4287 50  >40 mg/dL Final   Total CHOL/HDL Ratio 09/09/2020 3.8  RATIO Final   VLDL 09/09/2020 14  0 - 40 mg/dL Final   LDL Cholesterol 09/09/2020 128 (A) 0 - 99 mg/dL Final   Comment:        Total Cholesterol/HDL:CHD Risk Coronary Heart Disease Risk Table                     Men   Women  1/2 Average Risk   3.4   3.3  Average Risk       5.0   4.4  2 X Average Risk   9.6   7.1  3 X Average Risk  23.4   11.0        Use the calculated Patient Ratio above and the CHD Risk Table to determine the patient's  CHD Risk.        ATP III CLASSIFICATION (LDL):  <100     mg/dL   Optimal  573-220  mg/dL   Near or Above                    Optimal  130-159  mg/dL   Borderline  254-270  mg/dL   High  >623     mg/dL   Very High Performed at [facility], [address], , [place] [address]   Office Visit on 05/24/2020  Component Date Value Ref Range Status   MOG Antibody, Cell-based IFA 05/24/2020 Negative  Negative Final   NMO IgG Autoantibodies 05/24/2020 <1.5  0.0 - 3.0 U/mL Final   Comment:                              Negative:      0.0 - 3.0                              Positive:           >3.0   Admission on 05/23/2020, Discharged on 05/23/2020  Component Date Value Ref Range Status   WBC 05/23/2020 12.5 (A) 4.0 - 10.5 K/uL Final   RBC 05/23/2020 4.73  3.87 - 5.11 MIL/uL Final   Hemoglobin 05/23/2020 13.9  12.0 - 15.0 g/dL Final   HCT 15/17/6160 41.3  36.0 - 46.0 % Final   MCV 05/23/2020 87.3  80.0 - 100.0 fL Final   MCH 05/23/2020 29.4  26.0 - 34.0 pg Final   MCHC 05/23/2020 33.7  30.0 - 36.0 g/dL Final   RDW 73/71/0626 11.9   11.5 - 15.5 % Final   Platelets 05/23/2020 419 (A) 150 - 400 K/uL Final   nRBC 05/23/2020 0.0  0.0 - 0.2 % Final   Neutrophils Relative % 05/23/2020 70  % Final   Neutro Abs 05/23/2020 8.5 (A) 1.7 - 7.7 K/uL Final   Lymphocytes Relative 05/23/2020 22  % Final   Lymphs Abs 05/23/2020 2.8  0.7 - 4.0 K/uL Final   Monocytes Relative 05/23/2020 7  % Final   Monocytes Absolute 05/23/2020 0.9  0.1 - 1.0 K/uL Final   Eosinophils Relative 05/23/2020 1  % Final   Eosinophils Absolute 05/23/2020 0.2  0.0 - 0.5 K/uL Final   Basophils Relative 05/23/2020 0  % Final   Basophils Absolute 05/23/2020 0.1  0.0 - 0.1 K/uL Final   Immature Granulocytes 05/23/2020 0  % Final   Abs Immature Granulocytes 05/23/2020 0.04  0.00 - 0.07 K/uL Final   Performed at [facility], [address] [place], [place] [address]   Sodium 05/23/2020 135  135 - 145 mmol/L Final   Potassium 05/23/2020 3.7  3.5 - 5.1 mmol/L Final   Chloride 05/23/2020 103  98 - 111 mmol/L Final   CO2 05/23/2020 22  22 - 32 mmol/L Final   Glucose, Bld 05/23/2020 108 (A) 70 - 99 mg/dL Final   Glucose reference range applies only to samples taken after fasting for at least 8 hours.   BUN 05/23/2020 13  6 - 20 mg/dL Final   Creatinine, Ser 05/23/2020 0.67  0.44 - 1.00 mg/dL Final   Calcium 62/70/3500 9.6  8.9 - 10.3 mg/dL Final   Total Protein 93/81/8299 6.6  6.5 - 8.1 g/dL Final   Albumin  05/23/2020 4.0  3.5 - 5.0 g/dL Final   AST 60/45/4098 19  15 - 41 U/L Final   ALT 05/23/2020 17  0 - 44 U/L Final   Alkaline Phosphatase 05/23/2020 77  38 - 126 U/L Final   Total Bilirubin 05/23/2020 0.5  0.3 - 1.2 mg/dL Final   GFR, Estimated 05/23/2020 >60  >60 mL/min Final   Comment: (NOTE) Calculated using the CKD-EPI Creatinine Equation (2021)    Anion gap 05/23/2020 10  5 - 15 Final   Performed at [facility], [address] [place], [place] [address]   Total CK 05/23/2020 109  38 - 234 U/L Final   Performed at [facility],  [address] [place], [place] [address]   I-stat hCG, quantitative 05/23/2020 <5.0  <5 mIU/mL Final   Comment 3 05/23/2020          Final   Comment:   GEST. AGE      CONC.  (mIU/mL)   <=1 WEEK        5 - 50     2 WEEKS       50 - 500     3 WEEKS       100 - 10,000     4 WEEKS     1,000 - 30,000        FEMALE AND NON-PREGNANT FEMALE:     LESS THAN 5 mIU/mL   Office Visit on 03/22/2020  Component Date Value Ref Range Status   Color, UA 03/22/2020 yellow  yellow Final   Clarity, UA 03/22/2020 clear  clear Final   Glucose, UA 03/22/2020 negative  negative mg/dL Final   Bilirubin, UA 62/13/0865 negative  negative Final   Ketones, POC UA 03/22/2020 negative  negative mg/dL Final   Spec Grav, UA 78/46/9629 1.025  1.010 - 1.025 Final   Blood, UA 03/22/2020 small (A) negative Final   pH, UA 03/22/2020 6.0  5.0 - 8.0 Final   POC PROTEIN,UA 03/22/2020 negative  negative, trace Final   Urobilinogen, UA 03/22/2020 0.2  0.2 or 1.0 E.U./dL Final   Nitrite, UA 52/84/1324 Negative  Negative Final   Leukocytes, UA 03/22/2020 Large (3+) (A) Negative Final    Allergies: Sumatriptan and Other  PTA Medications: (Not in a hospital admission)   Medical Decision Making  Patient recommended for inpatient psychiatris treatment. Patient will be admitted to [facility] for continuous assessment while awaiting bed placement.     Recommendations  Based on my evaluation the patient does not appear to have an emergency medical condition.  Maricela Bo, NP 09/10/20  4:09 AM

## 2020-09-09 NOTE — ED Notes (Signed)
Patient cooperative and pleasant with admission process.  Denied SI, HI, AVH.  Removed multiple piercings and earrings.  Stated unable to remove L eyebrow, bridge of nose, and bilat nipple piercings.  Offered food and fluids.  Oriented to unit.

## 2020-09-10 ENCOUNTER — Encounter (HOSPITAL_COMMUNITY): Payer: Self-pay | Admitting: Behavioral Health

## 2020-09-10 ENCOUNTER — Encounter (HOSPITAL_COMMUNITY): Payer: Self-pay | Admitting: Urology

## 2020-09-10 ENCOUNTER — Inpatient Hospital Stay (HOSPITAL_COMMUNITY)
Admission: AD | Admit: 2020-09-10 | Discharge: 2020-09-13 | DRG: 885 | Disposition: A | Discharge status: Home / Self Care | Payer: Medicaid Other | Source: Intra-hospital | Attending: Psychiatry | Admitting: Psychiatry

## 2020-09-10 DIAGNOSIS — F9821 Rumination disorder of infancy: Secondary | ICD-10-CM | POA: Diagnosis present

## 2020-09-10 DIAGNOSIS — R45851 Suicidal ideations: Secondary | ICD-10-CM | POA: Diagnosis present

## 2020-09-10 DIAGNOSIS — Z20822 Contact with and (suspected) exposure to covid-19: Secondary | ICD-10-CM | POA: Diagnosis present

## 2020-09-10 DIAGNOSIS — Z9152 Personal history of nonsuicidal self-harm: Secondary | ICD-10-CM

## 2020-09-10 DIAGNOSIS — Z79899 Other long term (current) drug therapy: Secondary | ICD-10-CM | POA: Diagnosis not present

## 2020-09-10 DIAGNOSIS — F332 Major depressive disorder, recurrent severe without psychotic features: Secondary | ICD-10-CM | POA: Diagnosis not present

## 2020-09-10 DIAGNOSIS — G4700 Insomnia, unspecified: Secondary | ICD-10-CM | POA: Diagnosis present

## 2020-09-10 DIAGNOSIS — F411 Generalized anxiety disorder: Secondary | ICD-10-CM | POA: Diagnosis not present

## 2020-09-10 DIAGNOSIS — M791 Myalgia, unspecified site: Secondary | ICD-10-CM | POA: Diagnosis present

## 2020-09-10 DIAGNOSIS — M5416 Radiculopathy, lumbar region: Secondary | ICD-10-CM | POA: Diagnosis present

## 2020-09-10 DIAGNOSIS — G43709 Chronic migraine without aura, not intractable, without status migrainosus: Secondary | ICD-10-CM | POA: Diagnosis not present

## 2020-09-10 DIAGNOSIS — E559 Vitamin D deficiency, unspecified: Secondary | ICD-10-CM | POA: Diagnosis present

## 2020-09-10 LAB — COMPREHENSIVE METABOLIC PANEL
ALT: 14 U/L (ref 0–44)
AST: 19 U/L (ref 15–41)
Albumin: 4.1 g/dL (ref 3.5–5.0)
Alkaline Phosphatase: 70 U/L (ref 38–126)
Anion gap: 8 (ref 5–15)
BUN: 6 mg/dL (ref 6–20)
CO2: 26 mmol/L (ref 22–32)
Calcium: 9.6 mg/dL (ref 8.9–10.3)
Chloride: 104 mmol/L (ref 98–111)
Creatinine, Ser: 0.78 mg/dL (ref 0.44–1.00)
GFR, Estimated: >60 mL/min (ref >60)
Glucose, Bld: 78 mg/dL (ref 70–99)
Potassium: 3.3 mmol/L — ABNORMAL LOW (ref 3.5–5.1)
Sodium: 138 mmol/L (ref 135–145)
Total Bilirubin: 0.5 mg/dL (ref 0.3–1.2)
Total Protein: 7.3 g/dL (ref 6.5–8.1)

## 2020-09-10 LAB — TSH: TSH: 2 u[IU]/mL (ref 0.350–4.500)

## 2020-09-10 LAB — RESP PANEL BY RT-PCR (FLU A&B, COVID) ARPGX2
Influenza A by PCR: NEGATIVE
Influenza B by PCR: NEGATIVE
SARS Coronavirus 2 by RT PCR: NEGATIVE

## 2020-09-10 LAB — LIPID PANEL
Cholesterol: 192 mg/dL (ref 0–200)
HDL: 50 mg/dL (ref >40)
LDL Cholesterol: 128 mg/dL — ABNORMAL HIGH (ref 0–99)
Total CHOL/HDL Ratio: 3.8 RATIO
Triglycerides: 68 mg/dL (ref <150)
VLDL: 14 mg/dL (ref 0–40)

## 2020-09-10 MED ORDER — BUPROPION HCL ER (XL) 300 MG PO TB24: 300.0000 mg | ORAL_TABLET | Freq: Every day | ORAL | Status: DC

## 2020-09-10 MED ORDER — HYDROXYZINE HCL 25 MG PO TABS
25.0000 mg | ORAL_TABLET | Freq: Three times a day (TID) | ORAL | Status: DC | PRN
  Administered 2020-09-10 – 2020-09-13 (×4): 25 mg via ORAL
  Filled 2020-09-10 (×4): qty 1

## 2020-09-10 MED ORDER — INDOMETHACIN 25 MG PO CAPS
25.0000 mg | ORAL_CAPSULE | Freq: Every day | ORAL | Status: DC | PRN
  Administered 2020-09-10 – 2020-09-12 (×3): 25 mg via ORAL
  Filled 2020-09-10 (×3): qty 1

## 2020-09-10 MED ORDER — FLUOXETINE HCL 20 MG PO CAPS
40.0000 mg | ORAL_CAPSULE | Freq: Every morning | ORAL | Status: DC
  Filled 2020-09-10: qty 2

## 2020-09-10 MED ORDER — BUSPIRONE HCL 15 MG PO TABS
30.0000 mg | ORAL_TABLET | Freq: Two times a day (BID) | ORAL | Status: DC
  Administered 2020-09-10 – 2020-09-13 (×6): 30 mg via ORAL
  Filled 2020-09-10 (×5): qty 2
  Filled 2020-09-10: qty 6
  Filled 2020-09-10: qty 2
  Filled 2020-09-10: qty 6
  Filled 2020-09-10: qty 2
  Filled 2020-09-10: qty 6
  Filled 2020-09-10 (×3): qty 2

## 2020-09-10 MED ORDER — BUPROPION HCL ER (XL) 300 MG PO TB24
300.0000 mg | ORAL_TABLET | Freq: Every day | ORAL | Status: DC
  Administered 2020-09-11 – 2020-09-13 (×3): 300 mg via ORAL
  Filled 2020-09-10 (×6): qty 1

## 2020-09-10 MED ORDER — ACETAMINOPHEN 325 MG PO TABS
650.0000 mg | ORAL_TABLET | Freq: Four times a day (QID) | ORAL | Status: DC | PRN
  Administered 2020-09-10: 650 mg via ORAL
  Filled 2020-09-10: qty 2

## 2020-09-10 MED ORDER — QUETIAPINE FUMARATE 25 MG PO TABS
25.0000 mg | ORAL_TABLET | Freq: Every day | ORAL | Status: DC
  Administered 2020-09-10: 25 mg via ORAL
  Filled 2020-09-10 (×4): qty 1

## 2020-09-10 MED ORDER — FLUOXETINE HCL 20 MG PO CAPS
60.0000 mg | ORAL_CAPSULE | Freq: Every morning | ORAL | Status: DC
  Administered 2020-09-11 – 2020-09-13 (×3): 60 mg via ORAL
  Filled 2020-09-10 (×6): qty 3

## 2020-09-10 MED ORDER — ENSURE ENLIVE PO LIQD
237.0000 mL | Freq: Two times a day (BID) | ORAL | Status: DC
  Administered 2020-09-10 – 2020-09-13 (×6): 237 mL via ORAL
  Filled 2020-09-10 (×10): qty 237

## 2020-09-10 NOTE — Discharge Instructions (Addendum)
Discharge to [facility] 

## 2020-09-10 NOTE — BH Assessment (Signed)
Per Tosin, AC, RN pt has been accepted to [facility] and assigned to room/bed: 303-1. Pt can come at 0900. Attending physician: Dr. Jola Babinski.   Updated disposition discussed with Cecilio Asper, NP and Andrew Au, RN.    Redmond Pulling, MS, [facility] Triage Specialist [phone]

## 2020-09-10 NOTE — ED Provider Notes (Signed)
[facility]/OBS ASAP Discharge Summary  Date and Time: 09/10/2020 8:10 AM  Name: Tracy Mcconnell  MRN:  [id number]   Discharge Diagnoses:  Final diagnoses:  MDD (major depressive disorder), recurrent severe, without psychosis (HCC)  Generalized anxiety disorder    Subjective: "I would like to be home. I am having anxiety about not being home."   CXK:GYJEHUD presented with chief complaint of worsening depression, social anxiety, feeling overwhelmed, and suicidal ideation with thoughts of jumping off the third floor however she states that she feels too exhausted and tired to carry it out.  Stay Summary:  Patient seen and examined face to face in the consult room. On approach, the patient is alert, oriented x 4 and answers questions appropriately. Her mood is depressed and affect is congruent. Eye contact is fair. Her thought process is logical, speech is coherent and tone is decreased. She is dressed in scrubs. She endorses passive suicidal thoughts. She states, "I am not wanting to kill myself. I am not wanting to be alive, like going to sleep and not waking up." She states that she's been feeling this way for months and denies any stressors or triggers. She denies homicidal ideations. She denies AVH. She does not appear to be responding to internal, or external stimuli. Patient's drug hx includes: Patient's UDS is positive for THC, Oxycodone, and Morphine. When asked about testing positive for opiates and asked if she have a prescription for the Oxycodone and Morphine, she states that she had an old prescription for Oxycodone after she had surgery and occasionally she will take the pills. She states that she doesn't take Morphine and doesn't know how she tested positive for it.   Patient endorses depressive symptoms of: sadness, hopelessness, lack of interest and pleasure in activities for a while, poor sleep,very low energy and some crying spells.   Patient states that she resides with her  mother and brother age 60 years old. 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  Patient's outpatient services include:None. Previously followed up with [facility]  Patient's current medication regimen includes: Patient states that she's been on Wellbutrin, Buspar, Prozac and Seroquel for two years. She states that the medications are prescribed by her PCP Dr. Heywood Bene.  buPROPion  300 mg Oral Daily   busPIRone  30 mg Oral BID   FLUoxetine  40 mg Oral q AM   QUEtiapine  25 mg Oral QHS    Total Time spent with patient: 20 minutes  Past Psychiatric History: MDD and Anxiety  Past Medical History:  Past Medical History:  Diagnosis Date   Anemia    Anxiety    Depression    GERD (gastroesophageal reflux disease)    Hypovitaminosis D 04/07/2019   Left eye complaint 06/08/2019   Migraines     Past Surgical History:  Procedure Laterality Date   CHOLECYSTECTOMY N/A 10/19/2019   Procedure: LAPAROSCOPIC CHOLECYSTECTOMY;  Surgeon: Berna Bue, MD;  Location: [facility];  Service: General;  Laterality: N/A;   LAPAROSCOPIC APPENDECTOMY N/A 01/17/2020   Procedure: APPENDECTOMY LAPAROSCOPIC;  Surgeon: Harriette Bouillon, MD;  Location: [facility];  Service: General;  Laterality: N/A;   Family History:  Family History  Problem Relation Age of Onset   Migraines Mother    Thyroid disease Mother    Cancer Maternal Grandmother    Diabetes Paternal Grandfather    Hypertension Maternal Grandfather  Diabetes Paternal Grandmother    Stroke Paternal Grandmother    Family Psychiatric History: Mother: Bipolar   Social History:  Social History   Substance and Sexual Activity  Alcohol Use No     Social History   Substance and Sexual Activity  Drug Use No    Social History   Socioeconomic History   Marital status: Significant Other    Spouse name: Not on file   Number of children: 0    Years of education: college student   Highest education level: Not on file  Occupational History   Not on file  Tobacco Use   Smoking status: Never   Smokeless tobacco: Never  Vaping Use   Vaping Use: Never used  Substance and Sexual Activity   Alcohol use: No   Drug use: No   Sexual activity: Not Currently  Other Topics Concern   Not on file  Social History Narrative   Lives at home alone   Right handed   Does not drink caffeine, makes her nauseated   Social Determinants of Corporate investment banker Strain: Not on file  Food Insecurity: Not on file  Transportation Needs: Not on file  Physical Activity: Not on file  Stress: Not on file  Social Connections: Not on file   SDOH:  SDOH Screenings   Alcohol Screen: Not on file  Depression (PHQ2-9): Low Risk    PHQ-2 Score: 0  Financial Resource Strain: Not on file  Food Insecurity: Not on file  Housing: Not on file  Physical Activity: Not on file  Social Connections: Not on file  Stress: Not on file  Tobacco Use: Low Risk    Smoking Tobacco Use: Never   Smokeless Tobacco Use: Never  Transportation Needs: Not on file    Tobacco Cessation:  N/A, patient does not currently use tobacco products  Current Medications:  Current Facility-Administered Medications  Medication Dose Route Frequency Provider Last Rate Last Admin   acetaminophen (TYLENOL) tablet 650 mg  650 mg Oral Q6H PRN Ajibola, Ene A, NP       alum & mag hydroxide-simeth (MAALOX/MYLANTA) 200-200-20 MG/5ML suspension 30 mL  30 mL Oral Q4H PRN Ajibola, Ene A, NP       buPROPion (WELLBUTRIN XL) 24 hr tablet 300 mg  300 mg Oral Daily Ajibola, Ene A, NP       busPIRone (BUSPAR) tablet 30 mg  30 mg Oral BID Ajibola, Ene A, NP   30 mg at 09/09/20 2243   FLUoxetine (PROZAC) capsule 40 mg  40 mg Oral q AM Ajibola, Ene A, NP       hydrOXYzine (ATARAX/VISTARIL) tablet 25 mg  25 mg Oral TID PRN Ajibola, Ene A, NP   25 mg at 09/09/20 2243   magnesium hydroxide (MILK  OF MAGNESIA) suspension 30 mL  30 mL Oral Daily PRN Ajibola, Ene A, NP       QUEtiapine (SEROQUEL) tablet 25 mg  25 mg Oral QHS Ajibola, Ene A, NP   25 mg at 09/09/20 2243   traZODone (DESYREL) tablet 50 mg  50 mg Oral QHS PRN Ajibola, Ene A, NP       Current Outpatient Medications  Medication Sig Dispense Refill   acetaminophen (TYLENOL) 325 MG tablet Take 2 tablets (650 mg total) by mouth every 6 (six) hours as needed for mild pain (or temp > 100).     Botulinum Toxin Type A (BOTOX) 200 units SOLR Inject 155 units intramuscularly into head and neck  muscles every 3 months by provider. (Patient taking differently: Inject 150 Units into the muscle See admin instructions. Inject 155 units intramuscularly into head and neck muscles every 3 months by provider.) 1 each 3   buPROPion (WELLBUTRIN XL) 300 MG 24 hr tablet TAKE 1 TABLET BY MOUTH EVERY DAY IN THE MORNING 90 tablet 1   busPIRone (BUSPAR) 30 MG tablet Take 1 tablet (30 mg total) by mouth 2 (two) times daily. 90 tablet 3   cyclobenzaprine (FLEXERIL) 5 MG tablet Take 1 tablet (5 mg total) by mouth 3 (three) times daily as needed for muscle spasms. 30 tablet 1   ergocalciferol (VITAMIN D2) 1.25 MG (50000 UT) capsule Take 50,000 Units by mouth every Friday.      FLUoxetine (PROZAC) 40 MG capsule Take 1 capsule (40 mg total) by mouth in the morning. 90 capsule 3   gabapentin (NEURONTIN) 600 MG tablet TAKE 1 TABLET (600 MG TOTAL) BY MOUTH IN THE MORNING, AT NOON, IN THE EVENING, AND AT BEDTIME. 120 tablet 5   indomethacin (INDOCIN) 25 MG capsule TAKE 1 CAPSULE (25 MG TOTAL) BY MOUTH DAILY AS NEEDED. 30 capsule 2   lamoTRIgine (LAMICTAL) 25 MG tablet Take 1 tablet at night for 5 days, then 1 in the morning and 1 tablet at night for 5 days, then 1 in the morning and 2 tablets at night for 5 days. Then you can go to 2 tablets in the morning and 2 tablets in the evening thereafter 120 tablet 3   LAVENDER OIL PO Take 1 capsule by mouth daily.     LORazepam  (ATIVAN) 1 MG tablet TAKE 1 TABLET BY MOUTH EVERY 6 HOURS AS NEEDED FOR ANXIETY. 30 tablet 1   magnesium oxide (MAG-OX) 400 MG tablet Take 400 mg by mouth daily.     naproxen (NAPROSYN) 500 MG tablet Take 1 tablet (500 mg total) by mouth 2 (two) times daily with a meal. 30 tablet 0   omeprazole (PRILOSEC) 20 MG capsule Take 1 capsule (20 mg total) by mouth daily. 30 capsule 3   QUEtiapine (SEROQUEL) 50 MG tablet TAKE 1/2 - 1 TABLET BY MOUTH AT BEDTIME FOR SLEEP. 90 tablet 0   Ubrogepant (UBRELVY) 100 MG TABS Take 100 mg by mouth daily as needed. Take one tablet at onset of headache, may repeat 1 tablet in 2 hours, no more than 2 tablets in 24 hours 10 tablet 11    PTA Medications: (Not in a hospital admission)   Musculoskeletal  Strength & Muscle Tone: within normal limits Gait & Station: normal Patient leans: N/A  Psychiatric Specialty Exam  Presentation  General Appearance: Appropriate for Environment  Eye Contact:Fair  Speech:Clear and Coherent  Speech Volume:Decreased  Handedness:Right   Mood and Affect  Mood:Depressed  Affect:Congruent; Depressed   Thought Process  Thought Processes:Coherent  Descriptions of Associations:Intact  Orientation:Full (Time, Place and Person)  Thought Content:WDL     Hallucinations:Hallucinations: None  Ideas of Reference:None  Suicidal Thoughts:Suicidal Thoughts: Yes, Passive SI Passive Intent and/or Plan: Without Plan  Homicidal Thoughts:Homicidal Thoughts: No   Sensorium  Memory:Immediate Fair; Recent Fair; Remote Fair  Judgment:Fair  Insight:Fair   Executive Functions  Concentration:Fair  Attention Span:Fair  Recall:Fair  Fund of Knowledge:Fair  Language:Fair   Psychomotor Activity  Psychomotor Activity:Psychomotor Activity: Normal   Assets  Assets:Communication Skills; Desire for Improvement; Housing; Leisure Time   Sleep  Sleep:Sleep: Poor Number of Hours of Sleep: 6   Nutritional Assessment  (For OBS and [facility]BC admissions  only) Has the patient had a weight loss or gain of 10 pounds or more in the last 3 months?: No Has the patient had a decrease in food intake/or appetite?: Yes Does the patient have dental problems?: No Does the patient have eating habits or behaviors that may be indicators of an eating disorder including binging or inducing vomiting?: No Has the patient recently lost weight without trying?: Yes, 2-13 lbs. Has the patient been eating poorly because of a decreased appetite?: No Malnutrition Screening Tool Score: 1    Physical Exam  Physical Exam Constitutional:      Appearance: Normal appearance.  HENT:     Head: Normocephalic.  Eyes:     Conjunctiva/sclera: Conjunctivae normal.  Cardiovascular:     Rate and Rhythm: Normal rate.  Pulmonary:     Effort: Pulmonary effort is normal.  Musculoskeletal:        General: Normal range of motion.     Cervical back: Normal range of motion.  Neurological:     General: No focal deficit present.     Mental Status: She is alert and oriented to person, place, and time.  Psychiatric:        Attention and Perception: Attention normal.        Mood and Affect: Mood is depressed.        Speech: Speech normal.        Behavior: Behavior is cooperative.        Thought Content: Thought content includes suicidal ideation. Thought content does not include homicidal ideation.        Cognition and Memory: Cognition normal.   Review of Systems  Constitutional: Negative.   HENT: Negative.    Eyes: Negative.   Respiratory: Negative.    Cardiovascular: Negative.   Gastrointestinal: Negative.   Genitourinary: Negative.   Musculoskeletal: Negative.   Skin: Negative.   Neurological: Negative.   Endo/Heme/Allergies: Negative.   Psychiatric/Behavioral:  Positive for depression and suicidal ideas. Negative for hallucinations. The patient is nervous/anxious.   Blood pressure 118/84, pulse 72, temperature 98.2 F (36.8 C),  temperature source Oral, resp. rate 16, SpO2 100 %. There is no height or weight on file to calculate BMI.  Disposition: Patient recommended for inpatient treatment. Patient accepted to [facility] for treatment. Patient verbally consent for treatment at [facility]. Admission orders completed. EMTALA completed.   Demetric Dunnaway L, NP 09/10/2020, 8:10 AM

## 2020-09-10 NOTE — Tx Team (Signed)
Initial Treatment Plan 09/10/2020 2:48 PM Tracy Mcconnell JQB:[id number]    PATIENT STRESSORS: Marital or family conflict   PATIENT STRENGTHS: Average or above average intelligence Capable of independent living Communication skills Motivation for treatment/growth Physical Health Supportive family/friends   PATIENT IDENTIFIED PROBLEMS: Suicidal ideation     "Feeling overwhelmed"    "Too many responsibilities"             DISCHARGE CRITERIA:  Improved stabilization in mood, thinking, and/or behavior Reduction of life-threatening or endangering symptoms to within safe limits Verbal commitment to aftercare and medication compliance  PRELIMINARY DISCHARGE PLAN: Outpatient therapy Return to previous living arrangement  PATIENT/FAMILY INVOLVEMENT: This treatment plan has been presented to and reviewed with the patient, Tracy Mcconnell,   The patient has been given the opportunity to ask questions and make suggestions.  Shela Nevin, RN 09/10/2020, 2:48 PM

## 2020-09-10 NOTE — BH Assessment (Signed)
Tracy Mcconnell is a 25 year old female who presents voluntary and unaccompanied to [facility]. Clinician asked the pt, "what brought you to the hospital?" Pt reports, bad social anxiety, depression, feeling overwhelmed; pt wants to check herself in for inpatient treatment. Pt reports, suicidal ideation with a plan. Pt denies, HI, AVH, access to weapons.   Determination of need: Emergent.    Redmond Pulling, MS, [facility], [facility] Triage Specialist [phone]

## 2020-09-10 NOTE — ED Notes (Signed)
Called safe transport services to transport pt to [facility] and per, Sheryle Hail (safe transport driver), they are unable to pick pt up due to car issues needing brakes, having to drop off a rental in [employer] and  transportation stop at 1500. Notified Cynda Familia and  Mariyah, Facilities manager of [employer] getting pt to [facility]. Advised to call [phone]. Called that number, which is safe transport as well. Sheryle Hail states she will be here in approx. 13 min to pick up that pt prior to dropping off the rental car. Mariyah notified of update.

## 2020-09-10 NOTE — [facility] Suicide Risk Assessment (Signed)
[facility] Admission Suicide Risk Assessment   Nursing information obtained from:  Patient Demographic factors:  Caucasian, Unemployed, Adolescent or young adult, Low socioeconomic status Current Mental Status:  Suicidal ideation indicated by patient Loss Factors:  NA Historical Factors:  Prior suicide attempts, Family history of mental illness or substance abuse Risk Reduction Factors:  Sense of responsibility to family, Positive social support, Living with another person, especially a relative  Total Time spent with patient: 15 minutes Principal Problem: MDD (major depressive disorder), recurrent severe, without psychosis (HCC) Diagnosis:  Principal Problem:   MDD (major depressive disorder), recurrent severe, without psychosis (HCC) Active Problems:   Hypovitaminosis D   Generalized anxiety disorder   Lumbar radiculopathy   Chronic migraine without aura without status migrainosus, not intractable   Myalgia  Subjective Data: "I feel I can be safe here."  History of Present Illness: Tracy Mcconnell is a 25 year old female who presented voluntary and unaccompanied to [facility]. She reported suicidal ideation with a plan to jump from the third story balcony. This represents the first psychiatric admission for this patient. She was admitted to [facility] for crisis stabilization and medication management. She stated she became overwhelmed with the care of her disabled mother, her 62 year old brother and her boyfriend who recently broke his leg. Her boyfriend cannot drive right now and her brother does not drive. She is solely responsible for transporting everyone to their appointments. She also has an autoimmune disorder and suffers with migraine headaches. She was attending [employer] and was seen at the counseling center there for depression and anxiety. She was started on Wellbutrin, Buspar, and Prozac. 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 Her PCP has been  refilling her medications ever since and she has had no dose or medication changes since 2019. She denies HI/AVH, paranoia and delusions.  Her UDS was positive for Oxycodone, morphine, and THC, BAL negative. She admitted to using some old oxycodone from a past gallbladder surgery. She smokes weed occasionally and stated she has no idea where the morphine came from. She appears depressed and stated she feels overwhelmed, sad, guilty and exhausted. She stated she is sleeping 5-6 hours per night, she has difficulty staying asleep. She denies symptoms of mania. She denies a family history of suicide and denies she has ever attempted suicide in the past. She admitted to a history of cutting in high school. She bites her nails. She stated she is on disability for chronic migraine headaches. She gets Botox injections every 3 months, last one in May. She also takes [place] for her headaches. 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   Patient stated she wants to be linked to therapy and a psychiatrist to better manage her medications. She is in agreement to increase her Prozac. She will have a friend bring her [name] to the hospital, we do not have it on the formulary.   She is able to contract for safety on the unit. Will continue to monitor for safety.    Associated Signs/Symptoms: Depression Symptoms:  depressed mood, insomnia, fatigue, feelings of worthlessness/guilt, hopelessness, suicidal thoughts without plan, anxiety, loss of energy/fatigue, Duration of Depression Symptoms: Greater than two weeks   (Hypo) Manic Symptoms:   None reported patient denies mania questions Anxiety Symptoms:  Excessive Worry, Psychotic Symptoms:  Hallucinations: None, denies paranoia and delusional thinking PTSD Symptoms: Negative   Past Psychiatric History: Depression, anxiety  Is the patient at risk to self? Yes.    Has the patient been a risk to self in the past 6 months? No.   Has the patient been a risk to self within the distant past? No.  Is the patient a risk to others? No.  Has the patient been a risk to others in the past 6 months? No.  Has the patient been a risk to others within the distant past? No.    Prior Inpatient Therapy:  No  Prior Outpatient Therapy:  No    Continued Clinical Symptoms:  Alcohol Use Disorder Identification Test Final Score (AUDIT): 0 The "Alcohol Use Disorders Identification Test", Guidelines for Use in Primary Care, Second Edition.  World Science writer [facility]). Score between 0-7:  no or low risk or alcohol related problems. Score between 8-15:  moderate risk of alcohol related problems. Score between 16-19:  high risk of alcohol related problems. Score 20 or above:  warrants further diagnostic evaluation for alcohol dependence and treatment.   CLINICAL FACTORS:   Severe Anxiety and/or Agitation Depression:   Hopelessness Impulsivity Insomnia Severe   Musculoskeletal: Strength & Muscle Tone: within normal limits Gait & Station: normal Patient leans: N/A  Psychiatric Specialty Exam:  Presentation  General Appearance: Appropriate for Environment; Casual; Fairly Groomed  Eye Contact:Good  Speech:Clear and Coherent; Normal Rate  Speech Volume:Normal  Handedness:Right   Mood and Affect  Mood:Anxious; Depressed  Affect:Appropriate; Congruent; Depressed   Thought Process  Thought Processes:Coherent; Linear; Goal Directed  Descriptions of Associations:Intact  Orientation:Full (Time, Place and Person)  Thought Content:Logical  History of Schizophrenia/Schizoaffective disorder:No data recorded Duration of Psychotic Symptoms:No data recorded Hallucinations:Hallucinations: None  Ideas of Reference:None  Suicidal Thoughts:Suicidal Thoughts: Yes, Passive SI Passive Intent and/or Plan: With Intent  Homicidal Thoughts:Homicidal Thoughts: No   Sensorium  Memory:Immediate Fair; Recent Fair; Remote  Fair  Judgment:Fair  Insight:Fair   Executive Functions  Concentration:Fair  Attention Span:Fair  Recall:Fair  Fund of Knowledge:Fair  Language:Fair   Psychomotor Activity  Psychomotor Activity:Psychomotor Activity: Normal   Assets  Assets:Communication Skills; Desire for Improvement; Financial Resources/Insurance; Housing; Physical Health; Resilience; Social Support; Transportation; Vocational/Educational   Sleep  Sleep:Sleep: Fair Number of Hours of Sleep: 6    Physical Exam: Vitals and nursing note reviewed. Constitutional:      Appearance: Normal appearance. HENT:    Head: Normocephalic. Pulmonary:    Effort: Pulmonary effort is normal. Musculoskeletal:        General: Normal range of motion.    Cervical back: Normal range of motion. Neurological:    General: No focal deficit present.    Mental Status: She is alert and oriented to person, place, and time. Psychiatric:        Attention and Perception: Attention normal. She does not perceive auditory or visual hallucinations.        Mood and Affect: Mood is anxious and depressed.        Speech: Speech normal.        Behavior: Behavior normal. Behavior is cooperative.        Thought Content: Thought content normal. Thought content is not paranoid or delusional. Thought content does not include homicidal or suicidal ideation. Thought content does not include homicidal or suicidal plan.        Cognition and Memory: Cognition normal.   Review of Systems Constitutional:  Negative for fever. HENT:  Negative for congestion, sinus pain and sore throat.   Respiratory:  Negative for cough and shortness of breath.  Cardiovascular:  Negative for chest pain. Gastrointestinal: Negative.   Genitourinary: Negative.   Musculoskeletal: Negative.   Neurological: Negative.   Blood pressure 104/72, pulse 90, temperature 98.1 F (36.7 C), temperature source Oral, resp. rate 16, height 5' (1.524 m), weight 53.5 kg, SpO2  100 %. Body mass index is 23.05 kg/m.   COGNITIVE FEATURES THAT CONTRIBUTE TO RISK:  Polarized thinking    SUICIDE RISK:   Moderate:  Frequent suicidal ideation with limited intensity, and duration, some specificity in terms of plans, no associated intent, good self-control, limited dysphoria/symptomatology, some risk factors present, and identifiable protective factors, including available and accessible social support.  PLAN OF CARE:   Treatment Plan Summary: 1. Admit for crisis management and stabilization, estimated length of stay 3-5 days.   2. Medication management to reduce current symptoms to base line and improve the patient's overall level of functioning: See MAR, Md's SRA & treatment plan.     Observation Level/Precautions:  15 minute checks  Laboratory:   Labs reviewed: CBC with diff - WBC 12.2, Platelets 407, NEUT# 7.8. CMP Potassium 3.3. Lipid profile LDL 128. TSH 2.00. Will order A1c.   Psychotherapy:    Medications:    Consultations:    Discharge Concerns:    Estimated LOS:  Other:      Physician Treatment Plan for Primary Diagnosis: MDD (major depressive disorder), recurrent severe, without psychosis (HCC) Long Term Goal(s): Improvement in symptoms so as ready for discharge   Short Term Goals: Ability to identify changes in lifestyle to reduce recurrence of condition will improve, Ability to verbalize feelings will improve, Ability to disclose and discuss suicidal ideas, Ability to identify and develop effective coping behaviors will improve, and Ability to identify triggers associated with substance abuse/mental health issues will improve   Physician Treatment Plan for Secondary Diagnosis: Principal Problem:   MDD (major depressive disorder), recurrent severe, without psychosis (HCC)   Long Term Goal(s): Improvement in symptoms so as ready for discharge   Short Term Goals: Ability to identify changes in lifestyle to reduce recurrence of condition will improve,  Ability to verbalize feelings will improve, Ability to disclose and discuss suicidal ideas, Ability to identify and develop effective coping behaviors will improve, and Ability to identify triggers associated with substance abuse/mental health issues will improve     I certify that inpatient services furnished can reasonably be expected to improve the patient's condition.   Mariel Craft, MD 09/10/2020, 6:44 PM

## 2020-09-10 NOTE — Progress Notes (Signed)
Adult Psychoeducational Group Note  Date:  09/10/2020 Time:  11:58 PM  Group Topic/Focus:  Wrap-Up Group:   The focus of this group is to help patients review their daily goal of treatment and discuss progress on daily workbooks.  Participation Level:  Minimal  Participation Quality:  Appropriate  Affect:  Appropriate  Cognitive:  Alert and Appropriate  Insight: Appropriate  Engagement in Group:  Supportive  Modes of Intervention:  Discussion  Additional Comments: Pt articulated that her goal for today was to focus on her treatment plan, and this goal was accomplished. Pt stated she did talk with staff and about her care. Pt conveyed she made a phone call to her friend, Billee Cashing and reported relationship with her family and support system was improving. Pt reported she took all medications provided and attended all meal services with 100% intake. Pt said her appetite was good today. Pt evaluated her sleep last night as good. Pt verbalized she felt good about herself and rated her overall day a 7 out of 10 on this date. Pt articulated she had no physical pain. Pt denies no auditory or visual hallucinations or thoughts of harming herself or others. Pt said she would alert staff if anything changed. End of Wrap-Up Group progress report.   Nicoletta Dress 09/10/2020, 11:58 PM
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
## 2020-09-10 NOTE — H&P (Signed)
Psychiatric Admission Assessment Adult  Patient Identification: Tracy Mcconnell MRN:  [id number] Date of Evaluation:  09/10/2020 Chief Complaint:  MDD (major depressive disorder), recurrent severe, without psychosis (HCC) [F33.2] Principal Diagnosis: MDD (major depressive disorder), recurrent severe, without psychosis (HCC) Diagnosis:  Active Problems:   MDD (major depressive disorder), recurrent severe, without psychosis (HCC)  History of Present Illness: Tracy Mcconnell is a 25 year old female who presented voluntary and unaccompanied to [facility]. She reported suicidal ideation with a plan to jump from the third story balcony. This represents the first psychiatric admission for this patient. She was admitted to [facility] for crisis stabilization and medication management. She stated she became overwhelmed with the care of her disabled mother, her 21 year old brother and her boyfriend who recently broke his leg. Her boyfriend cannot drive right now and her brother does not drive. She is solely responsible for transporting everyone to their appointments. She also has an autoimmune disorder and suffers with migraine headaches. She was attending [employer] and was seen at the counseling center there for depression and anxiety. She was started on Wellbutrin, Buspar, and Prozac. 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 Her PCP has been refilling her medications ever since and she has had no dose or medication changes since 2019. She denies HI/AVH, paranoia and delusions.  Her UDS was positive for Oxycodone, morphine, and THC, BAL negative. She admitted to using some old oxycodone from a past gallbladder surgery. She smokes weed occasionally and stated she has no idea where the morphine came from. She appears depressed and stated she feels overwhelmed, sad, guilty and exhausted. She stated she is sleeping 5-6 hours per night, she has difficulty staying asleep. She  denies symptoms of mania. She denies a family history of suicide and denies she has ever attempted suicide in the past. She admitted to a history of cutting in high school. She bites her nails. She stated she is on disability for chronic migraine headaches. She gets Botox injections every 3 months, last one in May. She also takes [place] for her headaches. 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   Patient stated she wants to be linked to therapy and a psychiatrist to better manage her medications. She is in agreement to increase her Prozac. She will have a friend bring her [name] to the hospital, we do not have it on the formulary.   She is able to contract for safety on the unit. Will continue to monitor for safety.    Associated Signs/Symptoms: Depression Symptoms:  depressed mood, insomnia, fatigue, feelings of worthlessness/guilt, hopelessness, suicidal thoughts without plan, anxiety, loss of energy/fatigue, Duration of Depression Symptoms: Greater than two weeks  (Hypo) Manic Symptoms:   None reported patient denies mania questions Anxiety Symptoms:  Excessive Worry, Psychotic Symptoms:  Hallucinations: None, denies paranoia and delusional thinking PTSD Symptoms: Negative Total Time spent with patient: 1 hour  Past Psychiatric History: Depression, anxiety  Is the patient at risk to self? Yes.    Has the patient been a risk to self in the past 6 months? No.  Has the patient been a risk to self within the distant past? No.  Is the patient a risk to others? No.  Has the patient been a risk to others in the past 6 months? No.  Has the patient been a risk to others within the distant past? No.   Prior Inpatient Therapy:  No  Prior Outpatient Therapy:  No   Alcohol Screening: 1. How often do you have a drink containing alcohol?: Never 2. How many drinks containing alcohol do you have on a typical day when you are drinking?: 1 or 2 3. How often  do you have six or more drinks on one occasion?: Never AUDIT-C Score: 0 4. How often during the last year have you found that you were not able to stop drinking once you had started?: Never 5. How often during the last year have you failed to do what was normally expected from you because of drinking?: Never 6. How often during the last year have you needed a first drink in the morning to get yourself going after a heavy drinking session?: Never 7. How often during the last year have you had a feeling of guilt of remorse after drinking?: Never 8. How often during the last year have you been unable to remember what happened the night before because you had been drinking?: Never 9. Have you or someone else been injured as a result of your drinking?: No 10. Has a relative or friend or a doctor or another health worker been concerned about your drinking or suggested you cut down?: No Alcohol Use Disorder Identification Test Final Score (AUDIT): 0 Substance Abuse History in the last 12 months:  No. Consequences of Substance Abuse: NA Previous Psychotropic Medications: No  Psychological Evaluations: No  Past Medical History:  Past Medical History:  Diagnosis Date   Anemia    Anxiety    Depression    GERD (gastroesophageal reflux disease)    Hypovitaminosis D 04/07/2019   Left eye complaint 06/08/2019   Migraines     Past Surgical History:  Procedure Laterality Date   CHOLECYSTECTOMY N/A 10/19/2019   Procedure: LAPAROSCOPIC CHOLECYSTECTOMY;  Surgeon: Berna Bue, MD;  Location: [facility];  Service: General;  Laterality: N/A;   LAPAROSCOPIC APPENDECTOMY N/A 01/17/2020   Procedure: APPENDECTOMY LAPAROSCOPIC;  Surgeon: Harriette Bouillon, MD;  Location: [facility];  Service: General;  Laterality: N/A;   Family History:  Family History  Problem Relation Age of Onset   Migraines Mother    Thyroid disease Mother    Cancer Maternal Grandmother    Diabetes Paternal Grandfather    Hypertension  Maternal Grandfather    Diabetes Paternal Grandmother    Stroke Paternal Grandmother    Family Psychiatric  History: Mother - Bipolar depression, PTSD Tobacco Screening:   Social History:  Social History   Substance and Sexual Activity  Alcohol Use No     Social History   Substance and Sexual Activity  Drug Use No    Additional Social History:  Some college, stopped with 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 Plans to attend beauty school in the fall.  Allergies:   Allergies  Allergen Reactions   Sumatriptan Shortness Of Breath and Other (See Comments)    Entire body "felt heavy"   Other Itching, Swelling and Other (See Comments)    Pesticides (family history)- Exposure face and mouth itch and the tongue/lips swell   Lab Results:  Results for orders placed or performed during the hospital encounter of 09/09/20 (from the past 48 hour(s))  POCT Urine Drug Screen - (ICup)     Status: Abnormal   Collection Time: 09/09/20  9:41 PM  Result Value Ref Range   POC Amphetamine UR None Detected NONE DETECTED (Cut Off Level 1000 ng/mL)   POC Secobarbital (BAR) None Detected NONE  DETECTED (Cut Off Level 300 ng/mL)   POC Buprenorphine (BUP) None Detected NONE DETECTED (Cut Off Level 10 ng/mL)   POC Oxazepam (BZO) None Detected NONE DETECTED (Cut Off Level 300 ng/mL)   POC Cocaine UR None Detected NONE DETECTED (Cut Off Level 300 ng/mL)   POC Methamphetamine UR None Detected NONE DETECTED (Cut Off Level 1000 ng/mL)   POC Morphine Positive (A) NONE DETECTED (Cut Off Level 300 ng/mL)   POC Oxycodone UR Positive (A) NONE DETECTED (Cut Off Level 100 ng/mL)   POC Methadone UR None Detected NONE DETECTED (Cut Off Level 300 ng/mL)   POC Marijuana UR Positive (A) NONE DETECTED (Cut Off Level 50 ng/mL)  Pregnancy, urine POC     Status: None   Collection Time: 09/09/20  9:48 PM  Result Value Ref Range   Preg Test, Ur NEGATIVE NEGATIVE    Comment:        THE SENSITIVITY OF  THIS METHODOLOGY IS >24 mIU/mL   Resp Panel by RT-PCR (Flu A&B, Covid) Nasopharyngeal Swab     Status: None   Collection Time: 09/09/20  9:52 PM   Specimen: Nasopharyngeal Swab; Nasopharyngeal(NP) swabs in vial transport medium  Result Value Ref Range   SARS Coronavirus 2 by RT PCR NEGATIVE NEGATIVE    Comment: (NOTE) SARS-CoV-2 target nucleic acids are NOT DETECTED.  The SARS-CoV-2 RNA is generally detectable in upper respiratory specimens during the acute phase of infection. The lowest concentration of SARS-CoV-2 viral copies this assay can detect is 138 copies/mL. A negative result does not preclude SARS-Cov-2 infection and should not be used as the sole basis for treatment or other patient management decisions. A negative result may occur with  improper specimen collection/handling, submission of specimen other than nasopharyngeal swab, presence of viral mutation(s) within the areas targeted by this assay, and inadequate number of viral copies(<138 copies/mL). A negative result must be combined with clinical observations, patient history, and epidemiological information. The expected result is Negative.  Fact Sheet for Patients:  BloggerCourse.comhttps://www.fda.gov/media/152166/download  Fact Sheet for Healthcare Providers:  SeriousBroker.ithttps://www.fda.gov/media/152162/download  This test is no t yet approved or cleared by the [place] FDA and  has been authorized for detection and/or diagnosis of SARS-CoV-2 by FDA under an Emergency Use Authorization (EUA). This EUA will remain  in effect (meaning this test can be used) for the duration of the COVID-19 declaration under Section 564(b)(1) of the Act, 21 U.S.C.section 360bbb-3(b)(1), unless the authorization is terminated  or revoked sooner.       Influenza A by PCR NEGATIVE NEGATIVE   Influenza B by PCR NEGATIVE NEGATIVE    Comment: (NOTE) The Xpert Xpress SARS-CoV-2/FLU/RSV plus assay is intended as an aid in the diagnosis of influenza from  Nasopharyngeal swab specimens and should not be used as a sole basis for treatment. Nasal washings and aspirates are unacceptable for Xpert Xpress SARS-CoV-2/FLU/RSV testing.  Fact Sheet for Patients: BloggerCourse.comhttps://www.fda.gov/media/152166/download  Fact Sheet for Healthcare Providers: SeriousBroker.ithttps://www.fda.gov/media/152162/download  This test is not yet approved or cleared by the [place] FDA and has been authorized for detection and/or diagnosis of SARS-CoV-2 by FDA under an Emergency Use Authorization (EUA). This EUA will remain in effect (meaning this test can be used) for the duration of the COVID-19 declaration under Section 564(b)(1) of the Act, 21 U.S.C. section 360bbb-3(b)(1), unless the authorization is terminated or revoked.  Performed at [facility], [address] [place], [place] [id number]   CBC with Differential/Platelet     Status: Abnormal  Collection Time: 09/09/20 10:00 PM  Result Value Ref Range   WBC 12.2 (H) 4.0 - 10.5 K/uL   RBC 4.58 3.87 - 5.11 MIL/uL   Hemoglobin 13.8 12.0 - 15.0 g/dL   HCT 16.1 09.6 - 04.5 %   MCV 90.2 80.0 - 100.0 fL   MCH 30.1 26.0 - 34.0 pg   MCHC 33.4 30.0 - 36.0 g/dL   RDW 40.9 81.1 - 91.4 %   Platelets 407 (H) 150 - 400 K/uL   nRBC 0.0 0.0 - 0.2 %   Neutrophils Relative % 64 %   Neutro Abs 7.8 (H) 1.7 - 7.7 K/uL   Lymphocytes Relative 27 %   Lymphs Abs 3.3 0.7 - 4.0 K/uL   Monocytes Relative 7 %   Monocytes Absolute 0.8 0.1 - 1.0 K/uL   Eosinophils Relative 2 %   Eosinophils Absolute 0.2 0.0 - 0.5 K/uL   Basophils Relative 0 %   Basophils Absolute 0.0 0.0 - 0.1 K/uL   Immature Granulocytes 0 %   Abs Immature Granulocytes 0.04 0.00 - 0.07 K/uL    Comment: Performed at [facility], [address] [place], [place] [address]  Comprehensive metabolic panel     Status: Abnormal   Collection Time: 09/09/20 10:00 PM  Result Value Ref Range   Sodium 138 135 - 145 mmol/L   Potassium 3.3 (L) 3.5 - 5.1 mmol/L    Chloride 104 98 - 111 mmol/L   CO2 26 22 - 32 mmol/L   Glucose, Bld 78 70 - 99 mg/dL    Comment: Glucose reference range applies only to samples taken after fasting for at least 8 hours.   BUN 6 6 - 20 mg/dL   Creatinine, Ser 6.21 0.44 - 1.00 mg/dL   Calcium 9.6 8.9 - 30.8 mg/dL   Total Protein 7.3 6.5 - 8.1 g/dL   Albumin 4.1 3.5 - 5.0 g/dL   AST 19 15 - 41 U/L   ALT 14 0 - 44 U/L   Alkaline Phosphatase 70 38 - 126 U/L   Total Bilirubin 0.5 0.3 - 1.2 mg/dL   GFR, Estimated >65 >78 mL/min    Comment: (NOTE) Calculated using the CKD-EPI Creatinine Equation (2021)    Anion gap 8 5 - 15    Comment: Performed at [facility], [address] [place], [place] [address]  TSH     Status: None   Collection Time: 09/09/20 10:00 PM  Result Value Ref Range   TSH 2.000 0.350 - 4.500 uIU/mL    Comment: Performed by a 3rd Generation assay with a functional sensitivity of <=0.01 uIU/mL. Performed at [facility], [address], [place], [place] [address]   Lipid panel     Status: Abnormal   Collection Time: 09/09/20 10:00 PM  Result Value Ref Range   Cholesterol 192 0 - 200 mg/dL   Triglycerides 68 <132 mg/dL   HDL 50 >44 mg/dL   Total CHOL/HDL Ratio 3.8 RATIO   VLDL 14 0 - 40 mg/dL   LDL Cholesterol 010 (H) 0 - 99 mg/dL    Comment:        Total Cholesterol/HDL:CHD Risk Coronary Heart Disease Risk Table                     Men   Women  1/2 Average Risk   3.4   3.3  Average Risk       5.0   4.4  2  X Average Risk   9.6   7.1  3 X Average Risk  23.4   11.0        Use the calculated Patient Ratio above and the CHD Risk Table to determine the patient's CHD Risk.        ATP III CLASSIFICATION (LDL):  <100     mg/dL   Optimal  283-662  mg/dL   Near or Above                    Optimal  130-159  mg/dL   Borderline  947-654  mg/dL   High  >650     mg/dL   Very High Performed at [facility], [address], [place], [place] [address]   POC SARS Coronavirus 2 Ag      Status: None   Collection Time: 09/09/20 10:11 PM  Result Value Ref Range   SARSCOV2ONAVIRUS 2 AG NEGATIVE NEGATIVE    Comment: (NOTE) SARS-CoV-2 antigen NOT DETECTED.   Negative results are presumptive.  Negative results do not preclude SARS-CoV-2 infection and should not be used as the sole basis for treatment or other patient management decisions, including infection  control decisions, particularly in the presence of clinical signs and  symptoms consistent with COVID-19, or in those who have been in contact with the virus.  Negative results must be combined with clinical observations, patient history, and epidemiological information. The expected result is Negative.  Fact Sheet for Patients: https://www.jennings-kim.com/  Fact Sheet for Healthcare Providers: https://alexander-rogers.biz/  This test is not yet approved or cleared by the [place] FDA and  has been authorized for detection and/or diagnosis of SARS-CoV-2 by FDA under an Emergency Use Authorization (EUA).  This EUA will remain in effect (meaning this test can be used) for the duration of  the COV ID-19 declaration under Section 564(b)(1) of the Act, 21 U.S.C. section 360bbb-3(b)(1), unless the authorization is terminated or revoked sooner.      Blood Alcohol level:  No results found for: [facility]  Metabolic Disorder Labs:  Lab Results  Component Value Date   HGBA1C 5.2 12/28/2019   No results found for: PROLACTIN Lab Results  Component Value Date   CHOL 192 09/09/2020   TRIG 68 09/09/2020   HDL 50 09/09/2020   CHOLHDL 3.8 09/09/2020   VLDL 14 09/09/2020   LDLCALC 128 (H) 09/09/2020    Current Medications: Current Facility-Administered Medications  Medication Dose Route Frequency Provider Last Rate Last Admin   acetaminophen (TYLENOL) tablet 650 mg  650 mg Oral Q6H PRN Laveda Abbe, NP       Melene Muller ON 09/11/2020] buPROPion (WELLBUTRIN XL) 24 hr tablet 300 mg  300 mg  Oral Daily White, Patrice L, NP       busPIRone (BUSPAR) tablet 30 mg  30 mg Oral BID White, Patrice L, NP       feeding supplement (ENSURE ENLIVE / ENSURE PLUS) liquid 237 mL  237 mL Oral BID BM Mariel Craft, MD   237 mL at 09/10/20 1604   [START ON 09/11/2020] FLUoxetine (PROZAC) capsule 60 mg  60 mg Oral q AM Laveda Abbe, NP       hydrOXYzine (ATARAX/VISTARIL) tablet 25 mg  25 mg Oral TID PRN White, Patrice L, NP   25 mg at 09/10/20 1606   indomethacin (INDOCIN) capsule 25 mg  25 mg Oral Daily PRN Laveda Abbe, NP   25 mg at 09/10/20 1606  QUEtiapine (SEROQUEL) tablet 25 mg  25 mg Oral QHS White, Patrice L, NP       PTA Medications: Medications Prior to Admission  Medication Sig Dispense Refill Last Dose   acetaminophen (TYLENOL) 325 MG tablet Take 2 tablets (650 mg total) by mouth every 6 (six) hours as needed for mild pain (or temp > 100).      buPROPion (WELLBUTRIN XL) 300 MG 24 hr tablet TAKE 1 TABLET BY MOUTH EVERY DAY IN THE MORNING 90 tablet 1    buPROPion (WELLBUTRIN XL) 300 MG 24 hr tablet Take 1 tablet (300 mg total) by mouth daily.      busPIRone (BUSPAR) 30 MG tablet Take 1 tablet (30 mg total) by mouth 2 (two) times daily. 90 tablet 3    FLUoxetine (PROZAC) 40 MG capsule Take 1 capsule (40 mg total) by mouth in the morning. 90 capsule 3    indomethacin (INDOCIN) 25 MG capsule TAKE 1 CAPSULE (25 MG TOTAL) BY MOUTH DAILY AS NEEDED. 30 capsule 2    naproxen (NAPROSYN) 500 MG tablet Take 1 tablet (500 mg total) by mouth 2 (two) times daily with a meal. 30 tablet 0    QUEtiapine (SEROQUEL) 50 MG tablet TAKE 1/2 - 1 TABLET BY MOUTH AT BEDTIME FOR SLEEP. 90 tablet 0    Ubrogepant (UBRELVY) 100 MG TABS Take 100 mg by mouth daily as needed. Take one tablet at onset of headache, may repeat 1 tablet in 2 hours, no more than 2 tablets in 24 hours 10 tablet 11     Musculoskeletal: Strength & Muscle Tone: within normal limits Gait & Station: normal Patient leans:  N/A  Psychiatric Specialty Exam:  Presentation  General Appearance: Appropriate for Environment; Casual; Fairly Groomed  Eye Contact:Good  Speech:Clear and Coherent; Normal Rate  Speech Volume:Normal  Handedness:Right   Mood and Affect  Mood:Anxious; Depressed  Affect:Appropriate; Congruent; Depressed   Thought Process  Thought Processes:Coherent; Linear; Goal Directed  Duration of Psychotic Symptoms: No data recorded Past Diagnosis of Schizophrenia or Psychoactive disorder: No data recorded Descriptions of Associations:Intact  Orientation:Full (Time, Place and Person)  Thought Content:Logical  Hallucinations:Hallucinations: None  Ideas of Reference:None  Suicidal Thoughts:Suicidal Thoughts: Yes, Passive SI Passive Intent and/or Plan: With Intent  Homicidal Thoughts:Homicidal Thoughts: No  Sensorium  Memory:Immediate Fair; Recent Fair; Remote Fair  Judgment:Fair  Insight:Fair  Executive Functions  Concentration:Fair  Attention Span:Fair  Recall:Fair  Fund of Knowledge:Fair  Language:Fair  Psychomotor Activity  Psychomotor Activity:Psychomotor Activity: Normal  Assets  Assets:Communication Skills; Desire for Improvement; Financial Resources/Insurance; Housing; Physical Health; Resilience; Social Support; Transportation; Vocational/Educational  Sleep  Sleep:Sleep: Fair Number of Hours of Sleep: 6  Physical Exam: Physical Exam Vitals and nursing note reviewed.  Constitutional:      Appearance: Normal appearance.  HENT:     Head: Normocephalic.  Pulmonary:     Effort: Pulmonary effort is normal.  Musculoskeletal:        General: Normal range of motion.     Cervical back: Normal range of motion.  Neurological:     General: No focal deficit present.     Mental Status: She is alert and oriented to person, place, and time.  Psychiatric:        Attention and Perception: Attention normal. She does not perceive auditory or visual  hallucinations.        Mood and Affect: Mood is anxious and depressed.        Speech: Speech normal.  Behavior: Behavior normal. Behavior is cooperative.        Thought Content: Thought content normal. Thought content is not paranoid or delusional. Thought content does not include homicidal or suicidal ideation. Thought content does not include homicidal or suicidal plan.        Cognition and Memory: Cognition normal.   Review of Systems  Constitutional:  Negative for fever.  HENT:  Negative for congestion, sinus pain and sore throat.   Respiratory:  Negative for cough and shortness of breath.   Cardiovascular:  Negative for chest pain.  Gastrointestinal: Negative.   Genitourinary: Negative.   Musculoskeletal: Negative.   Neurological: Negative.   Blood pressure 104/72, pulse 90, temperature 98.1 F (36.7 C), temperature source Oral, resp. rate 16, height 5' (1.524 m), weight 53.5 kg, SpO2 100 %. Body mass index is 23.05 kg/m.  Treatment Plan Summary: 1. Admit for crisis management and stabilization, estimated length of stay 3-5 days.    2. Medication management to reduce current symptoms to base line and improve the patient's overall level of functioning: See MAR, Md's SRA & treatment plan.    Observation Level/Precautions:  15 minute checks  Laboratory:   Labs reviewed: CBC with diff - WBC 12.2, Platelets 407, NEUT# 7.8. CMP Potassium 3.3. Lipid profile LDL 128. TSH 2.00. Will order A1c.   Psychotherapy:    Medications:    Consultations:    Discharge Concerns:    Estimated LOS:  Other:     Physician Treatment Plan for Primary Diagnosis: MDD (major depressive disorder), recurrent severe, without psychosis (HCC) Long Term Goal(s): Improvement in symptoms so as ready for discharge  Short Term Goals: Ability to identify changes in lifestyle to reduce recurrence of condition will improve, Ability to verbalize feelings will improve, Ability to disclose and discuss suicidal  ideas, Ability to identify and develop effective coping behaviors will improve, and Ability to identify triggers associated with substance abuse/mental health issues will improve  Physician Treatment Plan for Secondary Diagnosis: Principal Problem:   MDD (major depressive disorder), recurrent severe, without psychosis (HCC)  Long Term Goal(s): Improvement in symptoms so as ready for discharge  Short Term Goals: Ability to identify changes in lifestyle to reduce recurrence of condition will improve, Ability to verbalize feelings will improve, Ability to disclose and discuss suicidal ideas, Ability to identify and develop effective coping behaviors will improve, and Ability to identify triggers associated with substance abuse/mental health issues will improve  I certify that inpatient services furnished can reasonably be expected to improve the patient's condition.    Laveda Abbe, NP 6/11/20224:59 PM

## 2020-09-10 NOTE — Progress Notes (Signed)
EKG results placed on the outside of shadow chart  Normal sinus rhythm Normal ECG QT/QTc  390/446 ms

## 2020-09-10 NOTE — Progress Notes (Signed)
Patient is a 25 year old female who presented voluntarily from [facility] for worsening depression and SI. Pt has a hx of MDD with psychotic features, and reported having one prior suicide attempt 10 years ago by overdosing on her medications. Pt reported feeling "overwhelmed with life", and endorsed having panic attacks, crying spells, decreased appetite, and depression. Pt reported that she lives with her mother, brother and boyfriend. Pt stated that she cares for her mother- who has bipolar- and has "too many responsibilities". Pt presented today with an anxious affect/mood, calm, cooperative behavior, answered questions logically and coherently throughout admission interview. Pt currently denies SI/HI and A/V H, and does not appear to be responding to internal stimuli. VS obtained and recorded. Admission consents signed- verbal understanding expressed.Skin assessment revealed multiple tattoos, multiple piercings, and some old scars from cutting on left forearm. Patient oriented to the unit. Q 15 min checks initiated for safety. Patient provided with po fluids and lunch

## 2020-09-10 NOTE — ED Notes (Signed)
Discharge instructions provided and Pt stated understanding. Safe transport called for services to be provided to [facility]. Personal belongings returned from locker prior to d/c from facility. Pt alert, orient and ambulatory. Pt escorted to the sally port per staff. Safety maintained.

## 2020-09-10 NOTE — ED Notes (Signed)
Patient sleeping.  RR even and unlabored.  Continue to monitor for safety. 

## 2020-09-11 DIAGNOSIS — F9821 Rumination disorder of infancy: Secondary | ICD-10-CM | POA: Diagnosis present

## 2020-09-11 DIAGNOSIS — F411 Generalized anxiety disorder: Secondary | ICD-10-CM

## 2020-09-11 LAB — PROLACTIN: Prolactin: 36.5 ng/mL — ABNORMAL HIGH (ref 4.8–23.3)

## 2020-09-11 MED ORDER — QUETIAPINE FUMARATE 50 MG PO TABS
50.0000 mg | ORAL_TABLET | Freq: Every day | ORAL | Status: DC
  Administered 2020-09-11 – 2020-09-12 (×2): 50 mg via ORAL
  Filled 2020-09-11 (×6): qty 1

## 2020-09-11 MED ORDER — GABAPENTIN 400 MG PO CAPS
400.0000 mg | ORAL_CAPSULE | Freq: Three times a day (TID) | ORAL | Status: DC
  Administered 2020-09-11 – 2020-09-13 (×8): 400 mg via ORAL
  Filled 2020-09-11 (×18): qty 1

## 2020-09-11 NOTE — [facility] Group Notes (Signed)
The focus of this group is to help patients establish daily goals to achieve during treatment and discuss how the patient can incorporate goal setting into their daily lives to aide in recovery.  Pt did not attend group 

## 2020-09-11 NOTE — Progress Notes (Addendum)
  Pt presents with high anxiety and mild depression today.  She denies SI/HI, and AVH.  Pt verbally contracts for safety.   Pt states she takes Gabapentin 4 times per day at home.  Please review for medication reconciliation.    Pt signed a 72 hour Psychiatric Services Request for Discharge on 09-10-20 at 10:14 pm.  Please see chart.
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
## 2020-09-11 NOTE — [facility] Group Notes (Signed)
[facility] LCSW Group Therapy Note  09/11/2020    Type of Therapy and Topic:  Group Therapy:  A Hero Worthy of Support  Participation Level:  Minimal   Description of Group:  Patients in this group were introduced to the concept that additional supports including self-support are an essential part of recovery.  Matching needs with supports to help fulfill those needs was explained.  Establishing boundaries that can gradually be increased or decreased was described, with patients giving their own examples of establishing appropriate boundaries in their lives.  A song entitled "My Own Hero" was played and a group discussion ensued in which patients stated it inspired them to help themselves in order to succeed, because other people cannot achieve their goals such as sobriety or stability for them.  A song was played called "I Am Enough" which led to a discussion about being willing to believe we are worth the effort of being a self-support.   Therapeutic Goals: 1)  demonstrate the importance of being a key part of one's own support system 2)  discuss various available supports 3)  encourage patient to use music as part of their self-support and focus on goals 4)  elicit ideas from patients about supports that need to be added   Summary of Patient Progress:  The patient expressed that she needs to add therapy when she leaves the hospital.  While she was attentive, she was completely silent throughout group.  Therapeutic Modalities:   Motivational Interviewing Activity  Lynnell Chad

## 2020-09-11 NOTE — Progress Notes (Signed)
   09/11/20 1300  Psych Admission Type (Psych Patients Only)  Admission Status Voluntary  Psychosocial Assessment  Patient Complaints Anxiety  Eye Contact Fair  Facial Expression Sad  Affect Anxious  Speech Soft  Interaction Minimal  Motor Activity Other (Comment) (WDL)  Appearance/Hygiene Unremarkable  Behavior Characteristics Cooperative;Appropriate to situation  Mood Anxious;Pleasant  Thought Process  Coherency WDL  Content WDL  Delusions None reported or observed  Perception WDL  Hallucination None reported or observed  Judgment WDL  Confusion None  Danger to Self  Current suicidal ideation? Denies  Danger to Others  Danger to Others None reported or observed

## 2020-09-11 NOTE — Progress Notes (Addendum)
[facility] MD Progress Note  09/11/2020 8:29 PM Tracy Mcconnell  MRN:  [id number] Subjective:  "I am doing good today. I have no suicidal thoughts"  Principal Problem: MDD (major depressive disorder), recurrent severe, without psychosis (HCC) Diagnosis: Principal Problem:   MDD (major depressive disorder), recurrent severe, without psychosis (HCC) Active Problems:   Hypovitaminosis D   Generalized anxiety disorder   Lumbar radiculopathy   Chronic migraine without aura without status migrainosus, not intractable   Myalgia   Rumination disorder  Total Time spent with patient:  35 minutes  Tracy Mcconnell is a 25 y.o. female with a history of major depressive disorder, recurrent, severe, and anxiety, who was initially admitted for inpatient psychiatric hospitalization on 09/10/2020 for management of worsening anxiety and depression with suicidal thoughts. The patient is currently on Hospital Day 1.   Chart Review from last 24 hours:  The patient's chart was reviewed and nursing notes were reviewed. The patient's case was discussed in multidisciplinary team meeting. Per MAR, patient has been medication compliant.  There have been no behavioral concerns.  Patient slept 6 hours overnight.  Information Obtained Today During Patient Interview: The patient was seen and evaluated on the unit. On assessment today the patient reports that she needs to get home to help care for her family.  She signed a 72-hour form at 10:14 PM on 09/10/2020.  Patient states that she previously took gabapentin 4 times daily at home, and this will be restarted.  Patient continues to have thought ruminations that keep her awake at night.  We will start Seroquel 50 mg at bedtime for rumination disorder.  She continues to have passive suicidal ideation, but denies plan or intent today.  She denies homicidal ideation.  She denies AVH   Past Psychiatric History: See H&P  Past Medical History:  Past Medical  History:  Diagnosis Date   Anemia    Anxiety    Depression    GERD (gastroesophageal reflux disease)    Hypovitaminosis D 04/07/2019   Left eye complaint 06/08/2019   Migraines     Past Surgical History:  Procedure Laterality Date   CHOLECYSTECTOMY N/A 10/19/2019   Procedure: LAPAROSCOPIC CHOLECYSTECTOMY;  Surgeon: Berna Bue, MD;  Location: [facility];  Service: General;  Laterality: N/A;   LAPAROSCOPIC APPENDECTOMY N/A 01/17/2020   Procedure: APPENDECTOMY LAPAROSCOPIC;  Surgeon: Harriette Bouillon, MD;  Location: [facility];  Service: General;  Laterality: N/A;   Family History:  Family History  Problem Relation Age of Onset   Migraines Mother    Thyroid disease Mother    Cancer Maternal Grandmother    Diabetes Paternal Grandfather    Hypertension Maternal Grandfather    Diabetes Paternal Grandmother    Stroke Paternal Grandmother    Family Psychiatric  History: See H&P Social History:  Social History   Substance and Sexual Activity  Alcohol Use No     Social History   Substance and Sexual Activity  Drug Use No    Social History   Socioeconomic History   Marital status: Significant Other    Spouse name: Not on file   Number of children: 0   Years of education: college student   Highest education level: Not on file  Occupational History   Not on file  Tobacco Use   Smoking status: Never   Smokeless tobacco: Never  Vaping Use   Vaping Use: Never used  Substance and Sexual Activity   Alcohol use: No   Drug use: No  Sexual activity: Not Currently  Other Topics Concern   Not on file  Social History Narrative   Lives at home alone   Right handed   Does not drink caffeine, makes her nauseated   Social Determinants of Health   Financial Resource Strain: Not on file  Food Insecurity: Not on file  Transportation Needs: Not on file  Physical Activity: Not on file  Stress: Not on file  Social Connections: Not on file   Additional Social History:    Sleep:  Good  Appetite:  Good  Current Medications: Current Facility-Administered Medications  Medication Dose Route Frequency Provider Last Rate Last Admin   acetaminophen (TYLENOL) tablet 650 mg  650 mg Oral Q6H PRN Laveda Abbe, NP   650 mg at 09/10/20 1823   buPROPion (WELLBUTRIN XL) 24 hr tablet 300 mg  300 mg Oral Daily White, Patrice L, NP   300 mg at 09/11/20 0829   busPIRone (BUSPAR) tablet 30 mg  30 mg Oral BID White, Patrice L, NP   30 mg at 09/11/20 1717   feeding supplement (ENSURE ENLIVE / ENSURE PLUS) liquid 237 mL  237 mL Oral BID BM Mariel Craft, MD   237 mL at 09/11/20 1716   FLUoxetine (PROZAC) capsule 60 mg  60 mg Oral q AM Laveda Abbe, NP   60 mg at 09/11/20 0829   gabapentin (NEURONTIN) capsule 400 mg  400 mg Oral TID WC & HS Laveda Abbe, NP   400 mg at 09/11/20 1717   hydrOXYzine (ATARAX/VISTARIL) tablet 25 mg  25 mg Oral TID PRN Liborio Nixon L, NP   25 mg at 09/11/20 1051   indomethacin (INDOCIN) capsule 25 mg  25 mg Oral Daily PRN Laveda Abbe, NP   25 mg at 09/11/20 1050   QUEtiapine (SEROQUEL) tablet 50 mg  50 mg Oral QHS Laveda Abbe, NP        Lab Results:  Results for orders placed or performed during the hospital encounter of 09/09/20 (from the past 48 hour(s))  POCT Urine Drug Screen - (ICup)     Status: Abnormal   Collection Time: 09/09/20  9:41 PM  Result Value Ref Range   POC Amphetamine UR None Detected NONE DETECTED (Cut Off Level 1000 ng/mL)   POC Secobarbital (BAR) None Detected NONE DETECTED (Cut Off Level 300 ng/mL)   POC Buprenorphine (BUP) None Detected NONE DETECTED (Cut Off Level 10 ng/mL)   POC Oxazepam (BZO) None Detected NONE DETECTED (Cut Off Level 300 ng/mL)   POC Cocaine UR None Detected NONE DETECTED (Cut Off Level 300 ng/mL)   POC Methamphetamine UR None Detected NONE DETECTED (Cut Off Level 1000 ng/mL)   POC Morphine Positive (A) NONE DETECTED (Cut Off Level 300 ng/mL)   POC Oxycodone UR  Positive (A) NONE DETECTED (Cut Off Level 100 ng/mL)   POC Methadone UR None Detected NONE DETECTED (Cut Off Level 300 ng/mL)   POC Marijuana UR Positive (A) NONE DETECTED (Cut Off Level 50 ng/mL)  Pregnancy, urine POC     Status: None   Collection Time: 09/09/20  9:48 PM  Result Value Ref Range   Preg Test, Ur NEGATIVE NEGATIVE    Comment:        THE SENSITIVITY OF THIS METHODOLOGY IS >24 mIU/mL   Resp Panel by RT-PCR (Flu A&B, Covid) Nasopharyngeal Swab     Status: None   Collection Time: 09/09/20  9:52 PM   Specimen: Nasopharyngeal Swab; Nasopharyngeal(NP) swabs  in vial transport medium  Result Value Ref Range   SARS Coronavirus 2 by RT PCR NEGATIVE NEGATIVE    Comment: (NOTE) SARS-CoV-2 target nucleic acids are NOT DETECTED.  The SARS-CoV-2 RNA is generally detectable in upper respiratory specimens during the acute phase of infection. The lowest concentration of SARS-CoV-2 viral copies this assay can detect is 138 copies/mL. A negative result does not preclude SARS-Cov-2 infection and should not be used as the sole basis for treatment or other patient management decisions. A negative result may occur with  improper specimen collection/handling, submission of specimen other than nasopharyngeal swab, presence of viral mutation(s) within the areas targeted by this assay, and inadequate number of viral copies(<138 copies/mL). A negative result must be combined with clinical observations, patient history, and epidemiological information. The expected result is Negative.  Fact Sheet for Patients:  BloggerCourse.com  Fact Sheet for Healthcare Providers:  SeriousBroker.it  This test is no t yet approved or cleared by the [place] FDA and  has been authorized for detection and/or diagnosis of SARS-CoV-2 by FDA under an Emergency Use Authorization (EUA). This EUA will remain  in effect (meaning this test can be used) for the  duration of the COVID-19 declaration under Section 564(b)(1) of the Act, 21 U.S.C.section 360bbb-3(b)(1), unless the authorization is terminated  or revoked sooner.       Influenza A by PCR NEGATIVE NEGATIVE   Influenza B by PCR NEGATIVE NEGATIVE    Comment: (NOTE) The Xpert Xpress SARS-CoV-2/FLU/RSV plus assay is intended as an aid in the diagnosis of influenza from Nasopharyngeal swab specimens and should not be used as a sole basis for treatment. Nasal washings and aspirates are unacceptable for Xpert Xpress SARS-CoV-2/FLU/RSV testing.  Fact Sheet for Patients: BloggerCourse.com  Fact Sheet for Healthcare Providers: SeriousBroker.it  This test is not yet approved or cleared by the [place] FDA and has been authorized for detection and/or diagnosis of SARS-CoV-2 by FDA under an Emergency Use Authorization (EUA). This EUA will remain in effect (meaning this test can be used) for the duration of the COVID-19 declaration under Section 564(b)(1) of the Act, 21 U.S.C. section 360bbb-3(b)(1), unless the authorization is terminated or revoked.  Performed at [facility], [address] [place], [place] [address]   CBC with Differential/Platelet     Status: Abnormal   Collection Time: 09/09/20 10:00 PM  Result Value Ref Range   WBC 12.2 (H) 4.0 - 10.5 K/uL   RBC 4.58 3.87 - 5.11 MIL/uL   Hemoglobin 13.8 12.0 - 15.0 g/dL   HCT 09.3 81.8 - 29.9 %   MCV 90.2 80.0 - 100.0 fL   MCH 30.1 26.0 - 34.0 pg   MCHC 33.4 30.0 - 36.0 g/dL   RDW 37.1 69.6 - 78.9 %   Platelets 407 (H) 150 - 400 K/uL   nRBC 0.0 0.0 - 0.2 %   Neutrophils Relative % 64 %   Neutro Abs 7.8 (H) 1.7 - 7.7 K/uL   Lymphocytes Relative 27 %   Lymphs Abs 3.3 0.7 - 4.0 K/uL   Monocytes Relative 7 %   Monocytes Absolute 0.8 0.1 - 1.0 K/uL   Eosinophils Relative 2 %   Eosinophils Absolute 0.2 0.0 - 0.5 K/uL   Basophils Relative 0 %   Basophils Absolute  0.0 0.0 - 0.1 K/uL   Immature Granulocytes 0 %   Abs Immature Granulocytes 0.04 0.00 - 0.07 K/uL    Comment: Performed at [facility], [address] [place], [place] [address]  Comprehensive metabolic panel     Status: Abnormal   Collection Time: 09/09/20 10:00 PM  Result Value Ref Range   Sodium 138 135 - 145 mmol/L   Potassium 3.3 (L) 3.5 - 5.1 mmol/L   Chloride 104 98 - 111 mmol/L   CO2 26 22 - 32 mmol/L   Glucose, Bld 78 70 - 99 mg/dL    Comment: Glucose reference range applies only to samples taken after fasting for at least 8 hours.   BUN 6 6 - 20 mg/dL   Creatinine, Ser 0.100.78 0.44 - 1.00 mg/dL   Calcium 9.6 8.9 - 27.210.3 mg/dL   Total Protein 7.3 6.5 - 8.1 g/dL   Albumin 4.1 3.5 - 5.0 g/dL   AST 19 15 - 41 U/L   ALT 14 0 - 44 U/L   Alkaline Phosphatase 70 38 - 126 U/L   Total Bilirubin 0.5 0.3 - 1.2 mg/dL   GFR, Estimated >53>60 >66>60 mL/min    Comment: (NOTE) Calculated using the CKD-EPI Creatinine Equation (2021)    Anion gap 8 5 - 15    Comment: Performed at [facility], [address] [place], [place] [address]  TSH     Status: None   Collection Time: 09/09/20 10:00 PM  Result Value Ref Range   TSH 2.000 0.350 - 4.500 uIU/mL    Comment: Performed by a 3rd Generation assay with a functional sensitivity of <=0.01 uIU/mL. Performed at [facility], [address] [place], [place] [address]   Prolactin     Status: Abnormal   Collection Time: 09/09/20 10:00 PM  Result Value Ref Range   Prolactin 36.5 (H) 4.8 - 23.3 ng/mL    Comment: (NOTE) Performed At: [facility] [address] [place], [place] [id number] Jolene SchimkeNagendra Sanjai MD PI:[id number]   Lipid panel     Status: Abnormal   Collection Time: 09/09/20 10:00 PM  Result Value Ref Range   Cholesterol 192 0 - 200 mg/dL   Triglycerides 68 <630<150 mg/dL   HDL 50 >16>40 mg/dL   Total CHOL/HDL Ratio 3.8 RATIO   VLDL 14 0 - 40 mg/dL   LDL Cholesterol 010128 (H) 0 - 99 mg/dL    Comment:        Total  Cholesterol/HDL:CHD Risk Coronary Heart Disease Risk Table                     Men   Women  1/2 Average Risk   3.4   3.3  Average Risk       5.0   4.4  2 X Average Risk   9.6   7.1  3 X Average Risk  23.4   11.0        Use the calculated Patient Ratio above and the CHD Risk Table to determine the patient's CHD Risk.        ATP III CLASSIFICATION (LDL):  <100     mg/dL   Optimal  [id number]  mg/dL   Near or Above                    Optimal  130-159  mg/dL   Borderline  [id number]  mg/dL   High  >542>190     mg/dL   Very High Performed at [facility], [address] [place], [place] [address]   POC SARS Coronavirus 2 Ag     Status: None  Collection Time: 09/09/20 10:11 PM  Result Value Ref Range   SARSCOV2ONAVIRUS 2 AG NEGATIVE NEGATIVE    Comment: (NOTE) SARS-CoV-2 antigen NOT DETECTED.   Negative results are presumptive.  Negative results do not preclude SARS-CoV-2 infection and should not be used as the sole basis for treatment or other patient management decisions, including infection  control decisions, particularly in the presence of clinical signs and  symptoms consistent with COVID-19, or in those who have been in contact with the virus.  Negative results must be combined with clinical observations, patient history, and epidemiological information. The expected result is Negative.  Fact Sheet for Patients: https://www.jennings-kim.com/  Fact Sheet for Healthcare Providers: https://alexander-rogers.biz/  This test is not yet approved or cleared by the [place] FDA and  has been authorized for detection and/or diagnosis of SARS-CoV-2 by FDA under an Emergency Use Authorization (EUA).  This EUA will remain in effect (meaning this test can be used) for the duration of  the COV ID-19 declaration under Section 564(b)(1) of the Act, 21 U.S.C. section 360bbb-3(b)(1), unless the authorization is terminated or revoked sooner.      Blood  Alcohol level:  No results found for: [facility]  Metabolic Disorder Labs: Lab Results  Component Value Date   HGBA1C 5.2 12/28/2019   Lab Results  Component Value Date   PROLACTIN 36.5 (H) 09/09/2020   Lab Results  Component Value Date   CHOL 192 09/09/2020   TRIG 68 09/09/2020   HDL 50 09/09/2020   CHOLHDL 3.8 09/09/2020   VLDL 14 09/09/2020   LDLCALC 128 (H) 09/09/2020    Physical Findings: AIMS:  , ,  ,  ,    CIWA:    COWS:     Musculoskeletal: Strength & Muscle Tone: within normal limits Gait & Station: normal Patient leans: N/A  Psychiatric Specialty Exam:  Presentation  General Appearance: Appropriate for Environment; Casual; Fairly Groomed  Eye Contact:Good  Speech:Clear and Coherent; Normal Rate  Speech Volume:Normal  Handedness:Right  Mood and Affect  Mood:Anxious; Depressed  Affect:Appropriate; Congruent; Depressed  Thought Process  Thought Processes:Coherent; Linear; Goal Directed  Descriptions of Associations:Intact  Orientation:Full (Time, Place and Person)  Thought Content:Logical  History of Schizophrenia/Schizoaffective disorder:No data recorded Duration of Psychotic Symptoms:No data recorded Hallucinations:Hallucinations: None  Ideas of Reference:None  Suicidal Thoughts:Suicidal Thoughts: Yes, Passive SI Passive Intent and/or Plan: With Intent  Homicidal Thoughts:Homicidal Thoughts: No  Sensorium  Memory:Immediate Fair; Recent Fair; Remote Fair  Judgment:Fair  Insight:Fair  Executive Functions  Concentration:Fair  Attention Span:Fair  Recall:Fair  Fund of Knowledge:Fair  Language:Fair  Psychomotor Activity  Psychomotor Activity:Psychomotor Activity: Normal  Assets  Assets:Communication Skills; Desire for Improvement; Financial Resources/Insurance; Housing; Physical Health; Resilience; Social Support; Transportation; Vocational/Educational  Sleep  Sleep:Sleep: Fair Number of Hours of Sleep: 6  Physical  Exam: Physical Exam Vitals and nursing note reviewed.  Constitutional:      Appearance: Normal appearance.  HENT:     Head: Normocephalic.  Pulmonary:     Effort: Pulmonary effort is normal.  Musculoskeletal:        General: Normal range of motion.     Cervical back: Normal range of motion.  Neurological:     Mental Status: She is alert and oriented to person, place, and time.  Psychiatric:        Attention and Perception: Attention and perception normal. She does not perceive auditory or visual hallucinations.        Mood and Affect: Mood is anxious and depressed.  Speech: Speech normal.        Behavior: Behavior normal. Behavior is cooperative.        Thought Content: Thought content normal. Thought content is not paranoid or delusional. Thought content does not include homicidal or suicidal ideation. Thought content does not include homicidal or suicidal plan.        Cognition and Memory: Cognition normal.   Review of Systems  Constitutional:  Negative for fever.  HENT:  Negative for congestion, sinus pain and sore throat.   Respiratory:  Negative for cough and shortness of breath.   Cardiovascular: Negative.   Gastrointestinal: Negative.   Genitourinary: Negative.   Musculoskeletal: Negative.   Neurological: Negative.   Blood pressure 111/82, pulse 81, temperature 98 F (36.7 C), temperature source Oral, resp. rate 16, height 5' (1.524 m), weight 53.5 kg, SpO2 100 %. Body mass index is 23.05 kg/m.   Treatment Plan Summary: Daily contact with patient to assess and evaluate symptoms and progress in treatment and Medication management  Depression:  -Continue Wellbutrin XL 300 mg PO daily  -Continue Prozac 60 mg PO daily  Anxiety:  -Continue Buspar 30 mg PO BID -Continue Vistaril 25 mg PO TID PRN   Chronic Pain:  -Continue Gabapentin 400 mg PO TID &HS. (Home medication)  -Continue Indomethacin 25 mg PO daily PRN   Insomnia:  -Continue Seroquel 50 mg PO, take  1/2 to 1 tablet at bedtime (home medication)  Continue every 15 minute safety checks Encourage participation in the therapeutic milieu Discharge planning in progress-will need outpatient psychiatrist and therapist when discharged    EOS: 3-5 days       Laveda Abbe, NP 09/11/2020, 3:29 PM     I have reviewed the note by NP, and discussed the plan of care.  I am in agreement with the assessment and plan.  I have addended the note with my findings.            Mariel Craft, MD 09/11/2020, 8:29 PM

## 2020-09-11 NOTE — Progress Notes (Signed)
D:  Pt presents with high anxiety and mild depression.  Pt observed walking and sitting in common space with minimal interaction.  Pt denies SI/HI, and verbally contracts for safety.  Pt denies AVH.  A:  Labs/Vitals monitored; Medication education provided; Pt encouraged to communicate concerns.  R:  Pt remains safe on unit with q15 minute safety checks.  Will continue POC.     09/10/20 2136  Psych Admission Type (Psych Patients Only)  Admission Status Voluntary  Psychosocial Assessment  Patient Complaints Anxiety;Depression  Eye Contact Fair  Facial Expression Sad  Affect Anxious  Speech Soft  Interaction Minimal  Motor Activity Other (Comment) (WDL)  Appearance/Hygiene Unremarkable  Behavior Characteristics Cooperative;Appropriate to situation  Mood Pleasant;Labile  Thought Process  Coherency WDL  Content WDL  Delusions None reported or observed  Perception WDL  Hallucination None reported or observed  Judgment WDL  Confusion None  Danger to Self  Current suicidal ideation? Denies  Danger to Others  Danger to Others None reported or observed

## 2020-09-12 LAB — HEMOGLOBIN A1C
Hgb A1c MFr Bld: 5.1 % (ref 4.8–5.6)
Hgb A1c MFr Bld: 5.2 % (ref 4.8–5.6)
Mean Plasma Glucose: 100 mg/dL
Mean Plasma Glucose: 103 mg/dL

## 2020-09-12 MED ORDER — UBROGEPANT 100 MG PO TABS
100.0000 mg | ORAL_TABLET | Freq: Every day | ORAL | Status: DC | PRN
  Administered 2020-09-12: 100 mg via ORAL
  Filled 2020-09-12 (×2): qty 1

## 2020-09-12 NOTE — Progress Notes (Signed)
NUTRITION ASSESSMENT  Pt identified as at risk on the Malnutrition Screen Tool  INTERVENTION: 1. Supplements: Ensure Enlive po BID, each supplement provides 350 kcal and 20 grams of protein   NUTRITION DIAGNOSIS: Unintentional weight loss related to sub-optimal intake as evidenced by pt report.   Goal: Pt to meet >/= 90% of their estimated nutrition needs.  Monitor:  PO intake  Assessment:  Pt admitted for depression and anxiety. Had reported poor appetite PTA but now appetite is good. No weight loss noted. Ensure supplements have been ordered.   Height: Ht Readings from Last 1 Encounters:  09/10/20 5' (1.524 m)    Weight: Wt Readings from Last 1 Encounters:  09/10/20 53.5 kg    Weight Hx: Wt Readings from Last 10 Encounters:  09/10/20 53.5 kg  09/08/20 54.9 kg  09/05/20 55.3 kg  05/24/20 55.6 kg  04/11/20 53.9 kg  03/22/20 53.2 kg  01/16/20 56.3 kg  12/28/19 56.3 kg  12/16/19 59 kg  11/11/19 56.7 kg    BMI:  Body mass index is 23.05 kg/m. Pt meets criteria for normal based on current BMI.  Estimated Nutritional Needs: Kcal: 25-30 kcal/kg Protein: > 1 gram protein/kg Fluid: 1 ml/kcal  Diet Order:  Diet Order             Diet regular Room service appropriate? Yes; Fluid consistency: Thin  Diet effective now                  Pt is also offered choice of unit snacks mid-morning and mid-afternoon.  Pt is eating as desired.   Lab results and medications reviewed.   Tilda Franco, MS, RD, LDN Inpatient Clinical Dietitian Contact information available via Amion

## 2020-09-12 NOTE — Progress Notes (Signed)
D:  Patient's self inventory sheet, patient has fair sleep.  Fair appetite, normal energy level, good concentration.  Rated depression 2, denied hopeless, anxiety #8.  Denied withdrawals.  Denied SI.  Physical problems, lightheaded, headaches.  Physical pain, head and back, migraines, worst pain #8 in past 24 hours.  Goal is conversations with other patients to help anxiety with strangers.  Plans to attend group and meals, outside, sit in dayroom.  Migraines are bad and I'd like to have medication for it.  Light, sound, heat make them worst.  Does have discharge plans. A:  Medications administered per MD orders.  Emotional support and encouragement given patient. R:  Denied SI and HI, contracts for safety.  Denied A/V hallucinations.  Safety maintained with 15 minute checks.

## 2020-09-12 NOTE — [facility] Suicide Risk Assessment (Signed)
[facility] INPATIENT:  Family/Significant Other Suicide Prevention Education  Suicide Prevention Education:  Education Completed; Billee Cashing , boyfriend [phone],  (name of family member/significant other) has been identified by the patient as the family member/significant other with whom the patient will be residing, and identified as the person(s) who will aid the patient in the event of a mental health crisis (suicidal ideations/suicide attempt).  With written consent from the patient, the family member/significant other has been provided the following suicide prevention education, prior to the and/or following the discharge of the patient. Pt's boyfriend reported, " we have no firearms in the home and I will purchase a lock box for all medications, knives and other sharp items"   The suicide prevention education provided includes the following: Suicide risk factors Suicide prevention and interventions National Suicide Hotline telephone number [facility] assessment telephone number [facility] Emergency Assistance 911 [facility] and/or Residential Mobile Crisis Unit telephone number  Request made of family/significant other to: Remove weapons (e.g., guns, rifles, knives), all items previously/currently identified as safety concern.   Remove drugs/medications (over-the-counter, prescriptions, illicit drugs), all items previously/currently identified as a safety concern.  The family member/significant other verbalizes understanding of the suicide prevention education information provided.  The family member/significant other agrees to remove the items of safety concern listed above.  Heddy Vidana, Candace Cruise 09/12/2020, 3:21 PM

## 2020-09-12 NOTE — [facility] Group Notes (Signed)
Patient did not attend morning gaol and activity group 

## 2020-09-12 NOTE — [facility] Group Notes (Signed)
Occupational Therapy Group Note Date: 09/12/2020 Group Topic/Focus: Strengths Exploration  Group Description: Group encouraged increased participation and engagement through discussion focused on STRENGTHS. Patients were encouraged to fill out a worksheet to structure discussion, that included identifying strengths as it relates to one's relationships, profession, and personal fulfillment. Discussion followed with patients sharing their responses and highlighting their own personal strengths.  Therapeutic Goals: Identify strengths vs weaknesses Discuss and identify ways we can highlight our strengths  Participation Level: Active   Participation Quality: Independent   Behavior: Cooperative   Speech/Thought Process: Focused   Affect/Mood: Euthymic   Insight: Fair   Judgement: Fair   Individualization: Tracy Mcconnell was actively engaged in their participation of group discussion/activity. Pt identified "artistic ability and athletic ability" as her strengths and shared that she finds strength "in my interests, like I know I want to go back to school for hair". Appeared open and receptive to ongoing group discussion.  Modes of Intervention: Activity, Discussion, Education, and Support  Patient Response to Interventions:  Attentive, Engaged, Receptive, and Interested   Plan: Continue to engage patient in OT groups 2 - 3x/week.  09/12/2020  Donne Hazel, MOT, OTR/L

## 2020-09-12 NOTE — Tx Team (Signed)
Interdisciplinary Treatment and Diagnostic Plan Update  09/12/2020 Time of Session: 1:10pm Tracy Mcconnell MRN: [id number]  Principal Diagnosis: MDD (major depressive disorder), recurrent severe, without psychosis ([place])  Secondary Diagnoses: Principal Problem:   MDD (major depressive disorder), recurrent severe, without psychosis ([place]) Active Problems:   Hypovitaminosis D   Generalized anxiety disorder   Lumbar radiculopathy   Chronic migraine without aura without status migrainosus, not intractable   Myalgia   Rumination disorder   Current Medications:  Current Facility-Administered Medications  Medication Dose Route Frequency Provider Last Rate Last Admin   acetaminophen (TYLENOL) tablet 650 mg  650 mg Oral Q6H PRN Ethelene Hal, NP   650 mg at 09/10/20 1823   buPROPion (WELLBUTRIN XL) 24 hr tablet 300 mg  300 mg Oral Daily White, Patrice L, NP   300 mg at 09/12/20 0744   busPIRone (BUSPAR) tablet 30 mg  30 mg Oral BID White, Patrice L, NP   30 mg at 09/12/20 0744   feeding supplement (ENSURE ENLIVE / ENSURE PLUS) liquid 237 mL  237 mL Oral BID BM Lavella Hammock, MD   237 mL at 09/12/20 0917   FLUoxetine (PROZAC) capsule 60 mg  60 mg Oral q AM Ethelene Hal, NP   60 mg at 09/12/20 0744   gabapentin (NEURONTIN) capsule 400 mg  400 mg Oral TID WC & HS Ethelene Hal, NP   400 mg at 09/12/20 1152   hydrOXYzine (ATARAX/VISTARIL) tablet 25 mg  25 mg Oral TID PRN Darrol Angel L, NP   25 mg at 09/11/20 1051   indomethacin (INDOCIN) capsule 25 mg  25 mg Oral Daily PRN Ethelene Hal, NP   25 mg at 09/12/20 1152   QUEtiapine (SEROQUEL) tablet 50 mg  50 mg Oral QHS Ethelene Hal, NP   50 mg at 09/11/20 2127   Ubrogepant TABS 100 mg  100 mg Oral Daily PRN Sharma Covert, MD       PTA Medications: Medications Prior to Admission  Medication Sig Dispense Refill Last Dose   acetaminophen (TYLENOL) 325 MG tablet Take 2 tablets (650 mg total)  by mouth every 6 (six) hours as needed for mild pain (or temp > 100).    at unk   buPROPion (WELLBUTRIN XL) 300 MG 24 hr tablet Take 1 tablet (300 mg total) by mouth daily.   09/09/2020 at unk   busPIRone (BUSPAR) 30 MG tablet Take 1 tablet (30 mg total) by mouth 2 (two) times daily. 90 tablet 3 09/09/2020 at unk   FLUoxetine (PROZAC) 40 MG capsule Take 1 capsule (40 mg total) by mouth in the morning. 90 capsule 3 09/09/2020   gabapentin (NEURONTIN) 600 MG tablet Take 600 mg by mouth 4 (four) times daily.   unk at unk   QUEtiapine (SEROQUEL) 50 MG tablet TAKE 1/2 - 1 TABLET BY MOUTH AT BEDTIME FOR SLEEP. (Patient taking differently: Take 25-50 mg by mouth at bedtime. TAKE 1/2 - 1 TABLET BY MOUTH AT BEDTIME FOR SLEEP) 90 tablet 0 09/09/2020   Ubrogepant (UBRELVY) 100 MG TABS Take 100 mg by mouth daily as needed. Take one tablet at onset of headache, may repeat 1 tablet in 2 hours, no more than 2 tablets in 24 hours 10 tablet 11 unk at unk   buPROPion (WELLBUTRIN XL) 300 MG 24 hr tablet TAKE 1 TABLET BY MOUTH EVERY DAY IN THE MORNING (Patient not taking: No sig reported) 90 tablet 1 Not Taking   indomethacin (  INDOCIN) 25 MG capsule TAKE 1 CAPSULE (25 MG TOTAL) BY MOUTH DAILY AS NEEDED. (Patient not taking: No sig reported) 30 capsule 2 Not Taking   naproxen (NAPROSYN) 500 MG tablet Take 1 tablet (500 mg total) by mouth 2 (two) times daily with a meal. (Patient not taking: No sig reported) 30 tablet 0 Not Taking    Patient Stressors: Marital or family conflict  Patient Strengths: Average or above average intelligence Capable of independent living Communication skills Motivation for treatment/growth Physical Health Supportive family/friends  Treatment Modalities: Medication Management, Group therapy, Case management,  1 to 1 session with clinician, Psychoeducation, Recreational therapy.   Physician Treatment Plan for Primary Diagnosis: MDD (major depressive disorder), recurrent severe, without  psychosis ([place]) Long Term Goal(s): Improvement in symptoms so as ready for discharge   Short Term Goals: Ability to identify changes in lifestyle to reduce recurrence of condition will improve Ability to verbalize feelings will improve Ability to disclose and discuss suicidal ideas Ability to identify and develop effective coping behaviors will improve Ability to identify triggers associated with substance abuse/mental health issues will improve  Medication Management: Evaluate patient's response, side effects, and tolerance of medication regimen.  Therapeutic Interventions: 1 to 1 sessions, Unit Group sessions and Medication administration.  Evaluation of Outcomes: Not Met  Physician Treatment Plan for Secondary Diagnosis: Principal Problem:   MDD (major depressive disorder), recurrent severe, without psychosis ([place]) Active Problems:   Hypovitaminosis D   Generalized anxiety disorder   Lumbar radiculopathy   Chronic migraine without aura without status migrainosus, not intractable   Myalgia   Rumination disorder  Long Term Goal(s): Improvement in symptoms so as ready for discharge   Short Term Goals: Ability to identify changes in lifestyle to reduce recurrence of condition will improve Ability to verbalize feelings will improve Ability to disclose and discuss suicidal ideas Ability to identify and develop effective coping behaviors will improve Ability to identify triggers associated with substance abuse/mental health issues will improve     Medication Management: Evaluate patient's response, side effects, and tolerance of medication regimen.  Therapeutic Interventions: 1 to 1 sessions, Unit Group sessions and Medication administration.  Evaluation of Outcomes: Not Met   RN Treatment Plan for Primary Diagnosis: MDD (major depressive disorder), recurrent severe, without psychosis ([place]) Long Term Goal(s): Knowledge of disease and therapeutic regimen to maintain health will  improve  Short Term Goals: Ability to remain free from injury will improve, Ability to verbalize frustration and anger appropriately will improve, Ability to identify and develop effective coping behaviors will improve, and Compliance with prescribed medications will improve  Medication Management: RN will administer medications as ordered by provider, will assess and evaluate patient's response and provide education to patient for prescribed medication. RN will report any adverse and/or side effects to prescribing provider.  Therapeutic Interventions: 1 on 1 counseling sessions, Psychoeducation, Medication administration, Evaluate responses to treatment, Monitor vital signs and CBGs as ordered, Perform/monitor CIWA, COWS, AIMS and Fall Risk screenings as ordered, Perform wound care treatments as ordered.  Evaluation of Outcomes: Not Met   LCSW Treatment Plan for Primary Diagnosis: MDD (major depressive disorder), recurrent severe, without psychosis ([place]) Long Term Goal(s): Safe transition to appropriate next level of care at discharge, Engage patient in therapeutic group addressing interpersonal concerns.  Short Term Goals: Engage patient in aftercare planning with referrals and resources, Increase social support, Increase ability to appropriately verbalize feelings, Identify triggers associated with mental health/substance abuse issues, and Increase skills for wellness and recovery  Therapeutic Interventions: Assess for all discharge needs, 1 to 1 time with Education officer, museum, Explore available resources and support systems, Assess for adequacy in community support network, Educate family and significant other(s) on suicide prevention, Complete Psychosocial Assessment, Interpersonal group therapy.  Evaluation of Outcomes: Not Met   Progress in Treatment: Attending groups: Yes. Participating in groups: Yes. Taking medication as prescribed: Yes. Toleration medication: Yes. Family/Significant  other contact made: No, will contact:  friend Patient understands diagnosis: Yes. Discussing patient identified problems/goals with staff: Yes. Medical problems stabilized or resolved: Yes. Denies suicidal/homicidal ideation: Yes. Issues/concerns per patient self-inventory: No.   New problem(s) identified: No, Describe:  none  New Short Term/Long Term Goal(s): detox, medication management for mood stabilization; elimination of SI thoughts; development of comprehensive mental wellness/sobriety plan   Patient Goals:  " To re-engage in therapy"  Discharge Plan or Barriers: Patient recently admitted. CSW will continue to follow and assess for appropriate referrals and possible discharge planning.    Reason for Continuation of Hospitalization: Depression Medication stabilization Suicidal ideation Withdrawal symptoms  Estimated Length of Stay: 3-5 days  Attendees: Patient: Did not attend 09/12/2020   Physician: Ethelene Browns, MD 09/12/2020   Nursing:  09/12/2020   RN Care Manager: 09/12/2020   Social Worker: Darletta Moll 09/12/2020   Recreational Therapist:  09/12/2020   Other:  09/12/2020   Other:  09/12/2020   Other: 09/12/2020     Scribe for Treatment Team: Vassie Moselle, LCSW 09/12/2020 2:35 PM

## 2020-09-12 NOTE — Progress Notes (Signed)
Cooperative with treatment, she denies SI/HI & AVH. She was compliant with medication on shift. No issues to report on shift att his time.

## 2020-09-12 NOTE — Plan of Care (Signed)
Nurse discussed anxiety, depression and coping skills with patient.  

## 2020-09-12 NOTE — Progress Notes (Signed)
[facility] MD Progress Note  09/12/2020 12:04 PM Tracy Mcconnell  MRN:  [id number] Subjective:  "I am doing good today. I have no suicidal thoughts"  Principal Problem: MDD (major depressive disorder), recurrent severe, without psychosis ([facility]) Diagnosis: Principal Problem:   MDD (major depressive disorder), recurrent severe, without psychosis ([facility]) Active Problems:   Hypovitaminosis D   Generalized anxiety disorder   Lumbar radiculopathy   Chronic migraine without aura without status migrainosus, not intractable   Myalgia   Rumination disorder  Total Time spent with patient:  35 minutes  Tracy Mcconnell is a 25 y.o. female with a history of major depressive disorder, recurrent, severe, and anxiety, who was initially admitted for inpatient psychiatric hospitalization on 09/10/2020 for management of worsening anxiety and depression with suicidal thoughts. The patient is currently on Hospital Day 2.   Chart Review from last 24 hours:  The patient's chart was reviewed and nursing notes were reviewed. The patient's case was discussed in multidisciplinary team meeting. Per MAR, patient has been medication compliant.  There have been no behavioral concerns.  Patient slept 6 hours overnight.  Evaluation on the unit: The patient was seen and evaluated on the unit. On assessment today the patient stated she is feeling better today and slept well last night, record shows she slept 6.75 hours. She denied ruminating thoughts during the night. She signed a 72-hour form at 10:14 PM on 09/10/2020.  Patient stated her anxiety is much better today and she feels calmer. She believes the increase in Prozac is helping. Her gabapentin was restarted yesterday and this will help with her anxiety and her chronic pain. She denies any  passive suicidal ideation. She has no plan or intent. She denies homicidal ideation.  She denies AVH. She stated she feels that being here has helped and the rest has done a for  her frame of mind. She appears brighter today. She did complain of a headache. Her brother brought her [name] to the hospital last night and she will take that today. She is attending group therapy. She reported a good appetite. We discussed discharge Tuesday or Wednesday. She is in agreement with this plan. Will continue to monitor.   Past Psychiatric History: See H&P  Past Medical History:  Past Medical History:  Diagnosis Date   Anemia    Anxiety    Depression    GERD (gastroesophageal reflux disease)    Hypovitaminosis D 04/07/2019   Left eye complaint 06/08/2019   Migraines     Past Surgical History:  Procedure Laterality Date   CHOLECYSTECTOMY N/A 10/19/2019   Procedure: LAPAROSCOPIC CHOLECYSTECTOMY;  Surgeon: Berna Bue, MD;  Location: [facility];  Service: General;  Laterality: N/A;   LAPAROSCOPIC APPENDECTOMY N/A 01/17/2020   Procedure: APPENDECTOMY LAPAROSCOPIC;  Surgeon: Harriette Bouillon, MD;  Location: [facility];  Service: General;  Laterality: N/A;   Family History:  Family History  Problem Relation Age of Onset   Migraines Mother    Thyroid disease Mother    Cancer Maternal Grandmother    Diabetes Paternal Grandfather    Hypertension Maternal Grandfather    Diabetes Paternal Grandmother    Stroke Paternal Grandmother    Family Psychiatric  History: See H&P Social History:  Social History   Substance and Sexual Activity  Alcohol Use No     Social History   Substance and Sexual Activity  Drug Use No    Social History   Socioeconomic History   Marital status: Significant Other  Spouse name: Not on file   Number of children: 0   Years of education: college student   Highest education level: Not on file  Occupational History   Not on file  Tobacco Use   Smoking status: Never   Smokeless tobacco: Never  Vaping Use   Vaping Use: Never used  Substance and Sexual Activity   Alcohol use: No   Drug use: No   Sexual activity: Not Currently  Other Topics  Concern   Not on file  Social History Narrative   Lives at home alone   Right handed   Does not drink caffeine, makes her nauseated   Social Determinants of Health   Financial Resource Strain: Not on file  Food Insecurity: Not on file  Transportation Needs: Not on file  Physical Activity: Not on file  Stress: Not on file  Social Connections: Not on file   Additional Social History:    Sleep: Good  Appetite:  Good  Current Medications: Current Facility-Administered Medications  Medication Dose Route Frequency Provider Last Rate Last Admin   acetaminophen (TYLENOL) tablet 650 mg  650 mg Oral Q6H PRN Laveda Abbe, NP   650 mg at 09/10/20 1823   buPROPion (WELLBUTRIN XL) 24 hr tablet 300 mg  300 mg Oral Daily White, Patrice L, NP   300 mg at 09/12/20 0744   busPIRone (BUSPAR) tablet 30 mg  30 mg Oral BID White, Patrice L, NP   30 mg at 09/12/20 0744   feeding supplement (ENSURE ENLIVE / ENSURE PLUS) liquid 237 mL  237 mL Oral BID BM Mariel Craft, MD   237 mL at 09/12/20 0917   FLUoxetine (PROZAC) capsule 60 mg  60 mg Oral q AM Laveda Abbe, NP   60 mg at 09/12/20 0744   gabapentin (NEURONTIN) capsule 400 mg  400 mg Oral TID WC & HS Laveda Abbe, NP   400 mg at 09/12/20 1152   hydrOXYzine (ATARAX/VISTARIL) tablet 25 mg  25 mg Oral TID PRN Liborio Nixon L, NP   25 mg at 09/11/20 1051   indomethacin (INDOCIN) capsule 25 mg  25 mg Oral Daily PRN Laveda Abbe, NP   25 mg at 09/12/20 1152   QUEtiapine (SEROQUEL) tablet 50 mg  50 mg Oral QHS Laveda Abbe, NP   50 mg at 09/11/20 2127    Lab Results:  Results for orders placed or performed during the hospital encounter of 09/10/20 (from the past 48 hour(s))  Hemoglobin A1c     Status: None   Collection Time: 09/11/20  6:21 AM  Result Value Ref Range   Hgb A1c MFr Bld 5.2 4.8 - 5.6 %    Comment: (NOTE)         Prediabetes: 5.7 - 6.4         Diabetes: >6.4         Glycemic control for  adults with diabetes: <7.0    Mean Plasma Glucose 103 mg/dL    Comment: (NOTE) Performed At: [facility] [address] [place], [place] [id number] Jolene Schimke MD QI:[id number]     Blood Alcohol level:  No results found for: [facility]  Metabolic Disorder Labs: Lab Results  Component Value Date   HGBA1C 5.2 09/11/2020   MPG 103 09/11/2020   MPG 100 09/09/2020   Lab Results  Component Value Date   PROLACTIN 36.5 (H) 09/09/2020   Lab Results  Component Value Date   CHOL 192 09/09/2020  TRIG 68 09/09/2020   HDL 50 09/09/2020   CHOLHDL 3.8 09/09/2020   VLDL 14 09/09/2020   LDLCALC 128 (H) 09/09/2020    Physical Findings: AIMS:  , ,  ,  ,    CIWA:    COWS:     Musculoskeletal: Strength & Muscle Tone: within normal limits Gait & Station: normal Patient leans: N/A  Psychiatric Specialty Exam:  Presentation  General Appearance: Appropriate for Environment; Casual; Fairly Groomed  Eye Contact:Good  Speech:Clear and Coherent; Normal Rate  Speech Volume:Normal  Handedness:Right  Mood and Affect  Mood:Anxious; Depressed  Affect:Appropriate; Congruent; Depressed  Thought Process  Thought Processes:Coherent; Linear; Goal Directed  Descriptions of Associations:Intact  Orientation:Full (Time, Place and Person)  Thought Content:Logical  History of Schizophrenia/Schizoaffective disorder:No data recorded Duration of Psychotic Symptoms:No data recorded Hallucinations:No data recorded  Ideas of Reference:None  Suicidal Thoughts:No data recorded  Homicidal Thoughts:No data recorded  Sensorium  Memory:Immediate Fair; Recent Fair; Remote Fair  Judgment:Fair  Insight:Fair  Executive Functions  Concentration:Fair  Attention Span:Fair  Recall:Fair  Fund of Knowledge:Fair  Language:Fair  Psychomotor Activity  Psychomotor Activity:No data recorded  Assets  Assets:Communication Skills; Desire for Improvement; Financial  Resources/Insurance; Housing; Physical Health; Resilience; Social Support; Transportation; Vocational/Educational  Sleep  Sleep:No data recorded  Physical Exam: Physical Exam Vitals and nursing note reviewed.  Constitutional:      Appearance: Normal appearance.  HENT:     Head: Normocephalic.  Pulmonary:     Effort: Pulmonary effort is normal.  Musculoskeletal:        General: Normal range of motion.     Cervical back: Normal range of motion.  Neurological:     General: No focal deficit present.     Mental Status: She is alert and oriented to person, place, and time.  Psychiatric:        Attention and Perception: Attention and perception normal. She does not perceive auditory or visual hallucinations.        Mood and Affect: Mood is anxious and depressed.        Speech: Speech normal.        Behavior: Behavior normal. Behavior is cooperative.        Thought Content: Thought content normal. Thought content is not paranoid or delusional. Thought content does not include homicidal or suicidal ideation. Thought content does not include homicidal or suicidal plan.        Cognition and Memory: Cognition normal.   Review of Systems  Constitutional:  Negative for fever.  HENT:  Negative for congestion, sinus pain and sore throat.   Respiratory:  Negative for cough and shortness of breath.   Cardiovascular: Negative.   Gastrointestinal: Negative.   Genitourinary: Negative.   Musculoskeletal: Negative.   Neurological: Negative.   Blood pressure 103/80, pulse 94, temperature 97.8 F (36.6 C), temperature source Oral, resp. rate 16, height 5' (1.524 m), weight 53.5 kg, SpO2 100 %. Body mass index is 23.05 kg/m.   Treatment Plan Summary: Daily contact with patient to assess and evaluate symptoms and progress in treatment and Medication management  Depression:  -Continue Wellbutrin XL 300 mg PO daily  -Continue Prozac 60 mg PO daily  Anxiety:  -Continue Buspar 30 mg PO  BID -Continue Vistaril 25 mg PO TID PRN   Chronic Pain:  -Continue Gabapentin 400 mg PO TID &HS. (Home medication)  -Continue Indomethacin 25 mg PO daily PRN   Insomnia:  -Continue Seroquel 50 mg PO, take 1/2 to 1 tablet at bedtime (home  medication)  Migraines:  [name] per patients own medication  Continue every 15 minute safety checks Encourage participation in the therapeutic milieu Discharge planning in progress-will need outpatient psychiatrist and therapist when discharged    EOS: 3-5 days                 Laveda Abbe, NP 09/12/2020, 2:45 PM

## 2020-09-12 NOTE — Progress Notes (Signed)
Patient states that her positive event for the day is that she had a good day since she had a good talk with her nurse practioner. Her goal for tomorrow is to work on her discharge plans.

## 2020-09-12 NOTE — Progress Notes (Signed)
Recreation Therapy Notes  Date:  6.13.22 Time: 0930 Location: [address] Dayroom  Group Topic: Stress Management  Goal Area(s) Addresses:  Patient will identify positive stress management techniques. Patient will identify benefits of using stress management post d/c.  Behavioral Response: Engaged  Intervention: Stress Managment  Activity :  Meditation.  LRT played Mcconnell meditation that focused on setting boundaries for your own self care. Patients were to listen and follow as meditation was read to fully engage in activity.  Education:  Stress Management, Discharge Planning.   Education Outcome: Acknowledges Education  Clinical Observations/Feedback:  Pt attended and participated.  Pt had no questions and expressed no concerns.    Oneisha Ammons, LRT/CTRS         Tracy Mcconnell 09/12/2020 11:23 AM 

## 2020-09-13 ENCOUNTER — Other Ambulatory Visit: Payer: Self-pay | Admitting: Registered Nurse

## 2020-09-13 DIAGNOSIS — M549 Dorsalgia, unspecified: Secondary | ICD-10-CM

## 2020-09-13 DIAGNOSIS — F411 Generalized anxiety disorder: Secondary | ICD-10-CM | POA: Diagnosis not present

## 2020-09-13 DIAGNOSIS — F332 Major depressive disorder, recurrent severe without psychotic features: Secondary | ICD-10-CM | POA: Diagnosis not present

## 2020-09-13 MED ORDER — INDOMETHACIN 25 MG PO CAPS: 25.0000 mg | ORAL_CAPSULE | Freq: Every day | ORAL | 0 refills | Status: DC | PRN

## 2020-09-13 MED ORDER — HYDROXYZINE HCL 25 MG PO TABS: 25.0000 mg | ORAL_TABLET | Freq: Three times a day (TID) | ORAL | 0 refills | Status: DC | PRN

## 2020-09-13 MED ORDER — QUETIAPINE FUMARATE 50 MG PO TABS: 50.0000 mg | ORAL_TABLET | Freq: Every day | ORAL | 0 refills | Status: DC

## 2020-09-13 MED ORDER — BUSPIRONE HCL 30 MG PO TABS: 30.0000 mg | ORAL_TABLET | Freq: Two times a day (BID) | ORAL | 0 refills | Status: DC

## 2020-09-13 MED ORDER — FLUOXETINE HCL 20 MG PO CAPS: 60.0000 mg | ORAL_CAPSULE | Freq: Every morning | ORAL | 0 refills | Status: DC

## 2020-09-13 MED ORDER — BUPROPION HCL ER (XL) 300 MG PO TB24: 300.0000 mg | ORAL_TABLET | Freq: Every day | ORAL | 0 refills | Status: DC

## 2020-09-13 MED ORDER — GABAPENTIN 400 MG PO CAPS: 400.0000 mg | ORAL_CAPSULE | Freq: Three times a day (TID) | ORAL | 0 refills | Status: DC

## 2020-09-13 NOTE — Discharge Summary (Signed)
Physician Discharge Summary Note  Patient:  Tracy Mcconnell is an 25 y.o., female MRN:  [id number] DOB:  Aug 25, 1995 Patient phone:  [phone] (home)  Patient address:   [address] [place] [address],  Total Time spent with patient: 30 minutes  Date of Admission:  09/10/2020 Date of Discharge: 09/13/2020  Reason for Admission:  (From MD's admission note): Tracy Mcconnell is a 25 year old female who presented voluntary and unaccompanied to [facility]. She reported suicidal ideation with a plan to jump from the third story balcony. This represents the first psychiatric admission for this patient. She was admitted to [facility] for crisis stabilization and medication management. She stated she became overwhelmed with the care of her disabled mother, her 60 year old brother and her boyfriend who recently broke his leg. Her boyfriend cannot drive right now and her brother does not drive. She is solely responsible for transporting everyone to their appointments. She also has an autoimmune disorder and suffers with migraine headaches. She was attending [employer] and was seen at the counseling center there for depression and anxiety. She was started on Wellbutrin, Buspar, and Prozac. 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 Her PCP has been refilling her medications ever since and she has had no dose or medication changes since 2019. She denies HI/AVH, paranoia and delusions.  Her UDS was positive for Oxycodone, morphine, and THC, BAL negative. She admitted to using some old oxycodone from a past gallbladder surgery. She smokes weed occasionally and stated she has no idea where the morphine came from. She appears depressed and stated she feels overwhelmed, sad, guilty and exhausted. She stated she is sleeping 5-6 hours per night, she has difficulty staying asleep. She denies symptoms of mania. She denies a family history of suicide and denies she  has ever attempted suicide in the past. She admitted to a history of cutting in high school. She bites her nails. She stated she is on disability for chronic migraine headaches. She gets Botox injections every 3 months, last one in May. She also takes [place] for her headaches. 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   Patient stated she wants to be linked to therapy and a psychiatrist to better manage her medications. She is in agreement to increase her Prozac. She will have a friend bring her [name] to the hospital, we do not have it on the formulary.   She is able to contract for safety on the unit. Will continue to monitor for safety.   Principal Problem: MDD (major depressive disorder), recurrent severe, without psychosis (HCC) Discharge Diagnoses: Principal Problem:   MDD (major depressive disorder), recurrent severe, without psychosis (HCC) Active Problems:   Hypovitaminosis D   Generalized anxiety disorder   Lumbar radiculopathy   Chronic migraine without aura without status migrainosus, not intractable   Myalgia   Rumination disorder   Past Psychiatric History: See H&P  Past Medical History:  Past Medical History:  Diagnosis Date   Anemia    Anxiety    Depression    GERD (gastroesophageal reflux disease)    Hypovitaminosis D 04/07/2019   Left eye complaint 06/08/2019   Migraines     Past Surgical History:  Procedure Laterality Date   CHOLECYSTECTOMY N/A 10/19/2019   Procedure: LAPAROSCOPIC CHOLECYSTECTOMY;  Surgeon: Berna Bue, MD;  Location: [facility];  Service: General;  Laterality: N/A;   LAPAROSCOPIC APPENDECTOMY  N/A 01/17/2020   Procedure: APPENDECTOMY LAPAROSCOPIC;  Surgeon: Harriette Bouillonornett, Thomas, MD;  Location: [facility];  Service: General;  Laterality: N/A;   Family History:  Family History  Problem Relation Age of Onset   Migraines Mother    Thyroid disease Mother    Cancer Maternal Grandmother    Diabetes Paternal Grandfather     Hypertension Maternal Grandfather    Diabetes Paternal Grandmother    Stroke Paternal Grandmother    Family Psychiatric  History: See H&P Social History:  Social History   Substance and Sexual Activity  Alcohol Use No     Social History   Substance and Sexual Activity  Drug Use No    Social History   Socioeconomic History   Marital status: Significant Other    Spouse name: Not on file   Number of children: 0   Years of education: college student   Highest education level: Not on file  Occupational History   Not on file  Tobacco Use   Smoking status: Never   Smokeless tobacco: Never  Vaping Use   Vaping Use: Never used  Substance and Sexual Activity   Alcohol use: No   Drug use: No   Sexual activity: Not Currently  Other Topics Concern   Not on file  Social History Narrative   Lives at home alone   Right handed   Does not drink caffeine, makes her nauseated   Social Determinants of Health   Financial Resource Strain: Not on file  Food Insecurity: Not on file  Transportation Needs: Not on file  Physical Activity: Not on file  Stress: Not on file  Social Connections: Not on file    Hospital Course:  After the above admission evaluation, Tracy Mcconnell's presenting symptoms were noted. She was recommended for mood stabilization treatments. The medication regimen targeting those presenting symptoms were discussed with her & initiated with her consent. Her home medications were restarted. Her Prozac was increased from 40 mg to 60 mg. Her UDS on arrival to the ED was positive for morphine, oxycodone and THC. She did not develop any withdrawal symptoms during her hospital stay. She was however medicated, stabilized & discharged on the medications as listed on her discharge medication list below. Besides the mood stabilization treatments, Tracy Mcconnell was also enrolled & participated in the group counseling sessions being offered & held on this unit. She learned coping skills. She  presented no other significant pre-existing medical issues that required treatment. She tolerated his treatment regimen without any adverse effects or reactions reported.   During the course of her hospitalization, the 15-minute checks were adequate to ensure patient's safety. Tracy Mcconnell did not display any dangerous, violent or suicidal behavior on the unit.  She interacted with patients & staff appropriately, participated appropriately in the group sessions/therapies. Her medications were addressed & adjusted to meet her needs. She was recommended for outpatient follow-up care & medication management upon discharge to assure continuity of care & mood stability.  At the time of discharge patient is not reporting any acute suicidal/homicidal ideations. She feels more confident about her self-care & in managing his mental health. She currently denies any new issues or concerns. Education and supportive counseling provided throughout her hospital stay & upon discharge.   Today upon her discharge evaluation with the attending psychiatrist, Tracy Mcconnell shares she is doing well and feels ready for discharge. She denies any other specific concerns. She is sleeping well. Her appetite is good. She denies other physical complaints. She denies AH/VH, delusional  thoughts or paranoia. She does not appear to be responding to any internal stimuli. She feels that her medications have been helpful & is in agreement to continue her current treatment regimen as recommended. She was able to engage in safety planning including plan to return to [facility] or contact emergency services if she feels unable to maintain her own safety or the safety of others. Pt had no further questions, comments, or concerns. She left [facility] with all personal belongings in no apparent distress. Transportation per private vehicle with family.     Physical Findings: AIMS:  , ,  ,  ,    CIWA:    COWS:     Musculoskeletal: Strength & Muscle Tone: within normal  limits Gait & Station: normal Patient leans: N/A   Psychiatric Specialty Exam:  Presentation  General Appearance: Appropriate for Environment  Eye Contact:Good  Speech:Normal Rate  Speech Volume:Normal  Handedness:Right   Mood and Affect  Mood:Anxious  Affect:Appropriate   Thought Process  Thought Processes:Coherent  Descriptions of Associations:Intact  Orientation:Full (Time, Place and Person)  Thought Content:Logical  History of Schizophrenia/Schizoaffective disorder:No data recorded Duration of Psychotic Symptoms:No data recorded Hallucinations:Hallucinations: None  Ideas of Reference:None  Suicidal Thoughts:Suicidal Thoughts: No  Homicidal Thoughts:Homicidal Thoughts: No   Sensorium  Memory:Immediate Good; Recent Good; Remote Good  Judgment:Fair  Insight:Fair   Executive Functions  Concentration:Good  Attention Span:Good  Recall:Good  Fund of Knowledge:Good  Language:Good   Psychomotor Activity  Psychomotor Activity:Psychomotor Activity: Normal   Assets  Assets:Desire for Improvement; Resilience; Housing; Social Support   Sleep  Sleep:Sleep: Good Number of Hours of Sleep: 6  Physical Exam: Physical Exam Vitals and nursing note reviewed.  Constitutional:      Appearance: Normal appearance.  Pulmonary:     Effort: Pulmonary effort is normal.  Musculoskeletal:        General: Normal range of motion.     Cervical back: Normal range of motion.  Neurological:     Mental Status: She is alert and oriented to person, place, and time.  Psychiatric:        Attention and Perception: Attention and perception normal.        Mood and Affect: Mood normal.        Speech: Speech normal.        Behavior: Behavior normal. Behavior is cooperative.        Thought Content: Thought content normal. Thought content does not include suicidal ideation. Thought content does not include suicidal plan.        Cognition and Memory: Cognition normal.    Review of Systems  Constitutional:  Negative for fever.  HENT:  Negative for congestion, sinus pain and sore throat.   Respiratory: Negative.  Negative for cough and shortness of breath.   Cardiovascular: Negative.   Gastrointestinal: Negative.   Genitourinary: Negative.   Musculoskeletal: Negative.   Neurological: Negative.   Blood pressure 108/83, pulse 89, temperature 98.3 F (36.8 C), temperature source Oral, resp. rate 18, height 5' (1.524 m), weight 53.5 kg, SpO2 100 %. Body mass index is 23.05 kg/m.   Social History   Tobacco Use  Smoking Status Never  Smokeless Tobacco Never   Tobacco Cessation:  N/A, patient does not currently use tobacco products   Blood Alcohol level:  No results found for: [facility]  Metabolic Disorder Labs:  Lab Results  Component Value Date   HGBA1C 5.2 09/11/2020   MPG 103 09/11/2020   MPG 100 09/09/2020   Lab Results  Component Value Date   PROLACTIN 36.5 (H) 09/09/2020   Lab Results  Component Value Date   CHOL 192 09/09/2020   TRIG 68 09/09/2020   HDL 50 09/09/2020   CHOLHDL 3.8 09/09/2020   VLDL 14 09/09/2020   LDLCALC 128 (H) 09/09/2020    See Psychiatric Specialty Exam and Suicide Risk Assessment completed by Attending Physician prior to discharge.  Discharge destination:  Home  Is patient on multiple antipsychotic therapies at discharge:  No   Has Patient had three or more failed trials of antipsychotic monotherapy by history:  No  Recommended Plan for Multiple Antipsychotic Therapies: NA  Discharge Instructions     Diet - low sodium heart healthy   Complete by: As directed    Increase activity slowly   Complete by: As directed       Allergies as of 09/13/2020       Reactions   Sumatriptan Shortness Of Breath, Other (See Comments)   Entire body "felt heavy"   Other Itching, Swelling, Other (See Comments)   Pesticides (family history)- Exposure face and mouth itch and the tongue/lips swell         Medication List     STOP taking these medications    gabapentin 600 MG tablet Commonly known as: NEURONTIN Replaced by: gabapentin 400 MG capsule   naproxen 500 MG tablet Commonly known as: Naprosyn       TAKE these medications      Indication  acetaminophen 325 MG tablet Commonly known as: TYLENOL Take 2 tablets (650 mg total) by mouth every 6 (six) hours as needed for mild pain (or temp > 100).  Indication: Pain   buPROPion 300 MG 24 hr tablet Commonly known as: WELLBUTRIN XL Take 1 tablet (300 mg total) by mouth daily. Start taking on: September 14, 2020 What changed:  See the new instructions. Another medication with the same name was removed. Continue taking this medication, and follow the directions you see here.  Indication: Major Depressive Disorder   busPIRone 30 MG tablet Commonly known as: BUSPAR Take 1 tablet (30 mg total) by mouth 2 (two) times daily.  Indication: Anxiety Disorder   FLUoxetine 20 MG capsule Commonly known as: PROZAC Take 3 capsules (60 mg total) by mouth in the morning. Start taking on: September 14, 2020 What changed:  medication strength how much to take  Indication: Major Depressive Disorder   gabapentin 400 MG capsule Commonly known as: NEURONTIN Take 1 capsule (400 mg total) by mouth 4 (four) times daily -  with meals and at bedtime. Replaces: gabapentin 600 MG tablet  Indication: chronic pain   hydrOXYzine 25 MG tablet Commonly known as: ATARAX/VISTARIL Take 1 tablet (25 mg total) by mouth 3 (three) times daily as needed for anxiety.  Indication: Feeling Anxious   indomethacin 25 MG capsule Commonly known as: INDOCIN Take 1 capsule (25 mg total) by mouth daily as needed for moderate pain. What changed: reasons to take this  Indication: M.S. chronic pain   QUEtiapine 50 MG tablet Commonly known as: SEROQUEL Take 1 tablet (50 mg total) by mouth at bedtime. What changed: See the new instructions.  Indication: Major Depressive  Disorder   Ubrelvy 100 MG Tabs Generic drug: Ubrogepant Take 100 mg by mouth daily as needed. Take one tablet at onset of headache, may repeat 1 tablet in 2 hours, no more than 2 tablets in 24 hours  Indication: Migraine Headache        Follow-up Information  [facility] Follow up.   Specialty: Behavioral Health Why: You may go to this provider for therapy and medication management services.  Please go during walk in hours of Monday through Wednesday from 8:00 am to 11:00 am.   Services are first come, first served. Contact information: [address] [place] [address] [phone]                Follow-up recommendations:  Activity:  as tolerated Diet:  Heart healthy  Comments:  Prescriptions were given at discharge.  Patient is agreeable with the discharge plan.  She was given an opportunity to ask questions.  She appears to feel comfortable with discharge and denies any current suicidal or homicidal thoughts.   Patient is instructed prior to discharge to: Take all medications as prescribed by her mental healthcare provider. Report any adverse effects and or reactions from the medicines to her outpatient provider promptly. Patient has been instructed & cautioned: To not engage in alcohol and or illegal drug use while on prescription medicines. In the event of worsening symptoms, patient is instructed to call the crisis hotline, 911 and or go to the nearest ED for appropriate evaluation and treatment of symptoms. To follow-up with her primary care provider for your other medical issues, concerns and or health care needs.   Signed: Laveda Abbe, NP 09/13/2020, 10:22 AM

## 2020-09-13 NOTE — Progress Notes (Signed)
  [facility] Adult Case Management Discharge Plan :  Will you be returning to the same living situation after discharge:  Yes,  Home  At discharge, do you have transportation home?: Yes,  Family  Do you have the ability to pay for your medications: Yes,  Medicaid   Release of information consent forms completed and in the chart;  Patient's signature needed at discharge.  Patient to Follow up at:  Follow-up Information     [facility] Follow up.   Specialty: Behavioral Health Why: You may go to this provider for therapy and medication management services.  Please go during walk in hours of Monday through Wednesday from 8:00 am to 11:00 am.   Services are first come, first served. Contact information: [address] [place] [address] [phone]                Next level of care provider has access to [facility] Link:yes  Safety Planning and Suicide Prevention discussed: Yes,  Husband      Has patient been referred to the Quitline?: Patient refused referral  Patient has been referred for addiction treatment: N/A  Aram Beecham, LCSWA 09/13/2020, 1:30 PM

## 2020-09-13 NOTE — Plan of Care (Signed)
Nurse discussed anxiety, depression and coping skills with patient.  

## 2020-09-13 NOTE — BHH Group Notes (Signed)
ADULT GRIEF GROUP NOTE:   Spiritual care group on grief and loss facilitated by chaplain Dyanne Carrel, [facility]   Group Goal:   Support / Education around grief and loss   Members engage in facilitated group support and psycho-social education.   Group Description:   Following introductions and group rules, group members engaged in facilitated group dialog and support around topic of loss, with particular support around experiences of loss in their lives. Group Identified types of loss (relationships / self / things) and identified patterns, circumstances, and changes that precipitate losses. Reflected on thoughts / feelings around loss, normalized grief responses, and recognized variety in grief experience. Group noted Worden's four tasks of grief in discussion.   Group drew on Adlerian / Rogerian, narrative, MI,   Patient Progress: Patient was an active participant in group.  Patient shared personally and was an attentive listener to others in the group.  [employer], Bcc Pager, [phone] 10:02 AM

## 2020-09-13 NOTE — Progress Notes (Signed)
   09/13/20 0025  Psych Admission Type (Psych Patients Only)  Admission Status Voluntary  Psychosocial Assessment  Patient Complaints Anxiety  Eye Contact Fair  Facial Expression Sad  Affect Anxious  Speech Soft  Interaction Minimal  Motor Activity Other (Comment) (WDL)  Appearance/Hygiene Unremarkable  Behavior Characteristics Cooperative;Calm  Mood Depressed  Thought Process  Coherency WDL  Content WDL  Delusions None reported or observed  Perception WDL  Hallucination None reported or observed  Judgment WDL  Confusion None  Danger to Self  Current suicidal ideation? Denies  Danger to Others  Danger to Others None reported or observed

## 2020-09-13 NOTE — Progress Notes (Signed)
Discharge Note:  Patient discharged home with friend.  Suicide prevention information given and discussed with patient who stated she understood and had no questions.  Patient denied SI and HI.  Denied A/V hallucinations.  Patient stated she received all her belongings, clothing, etc.  Patient stated she appreciated all assistance from [facility] staff.  All required discharge information given.

## 2020-09-13 NOTE — Progress Notes (Addendum)
D:  Patient denied SI and HI, contracts for safety.  Denied A/V hallucinations.  Denied pain. A:  Medications administered per MD orders.  Emotional support and encouragement given patient. R:  Safety maintained with 15 minute checks.  Patient's self inventory sheet, patient sleeps good, no sleep medication.  Good appetite, high energy level, good concentration.  Rated depression 1, denied hopeless, anxiety 5.  Denied withdrawals.  Denied SI.  Denied physical problems.  Denied physical pain.  Goal is communication.  Plans to attend groups, meals, spend time in dayroom.  Feeling a lot better.  Does have discharge plans.

## 2020-09-13 NOTE — BHH Suicide Risk Assessment (Signed)
[facility] Discharge Suicide Risk Assessment   Principal Problem: MDD (major depressive disorder), recurrent severe, without psychosis (HCC) Discharge Diagnoses: Principal Problem:   MDD (major depressive disorder), recurrent severe, without psychosis (HCC) Active Problems:   Hypovitaminosis D   Generalized anxiety disorder   Lumbar radiculopathy   Chronic migraine without aura without status migrainosus, not intractable   Myalgia   Rumination disorder   Total Time spent with patient: 15 minutes  Musculoskeletal: Strength & Muscle Tone: within normal limits Gait & Station: normal Patient leans: N/A  Psychiatric Specialty Exam  Presentation  General Appearance: Appropriate for Environment  Eye Contact:Good  Speech:Normal Rate  Speech Volume:Normal  Handedness:Right   Mood and Affect  Mood:Anxious  Duration of Depression Symptoms: Greater than two weeks  Affect:Appropriate   Thought Process  Thought Processes:Coherent  Descriptions of Associations:Intact  Orientation:Full (Time, Place and Person)  Thought Content:Logical  History of Schizophrenia/Schizoaffective disorder:No data recorded Duration of Psychotic Symptoms:No data recorded Hallucinations:Hallucinations: None  Ideas of Reference:None  Suicidal Thoughts:Suicidal Thoughts: No  Homicidal Thoughts:Homicidal Thoughts: No   Sensorium  Memory:Immediate Good; Recent Good; Remote Good  Judgment:Fair  Insight:Fair   Executive Functions  Concentration:Good  Attention Span:Good  Recall:Good  Fund of Knowledge:Good  Language:Good   Psychomotor Activity  Psychomotor Activity:Psychomotor Activity: Normal   Assets  Assets:Desire for Improvement; Resilience; Housing; Social Support   Sleep  Sleep:Sleep: Good Number of Hours of Sleep: 6   Physical Exam: Physical Exam Vitals and nursing note reviewed.  Constitutional:      Appearance: Normal appearance.  HENT:     Head:  Normocephalic.  Pulmonary:     Effort: Pulmonary effort is normal.  Neurological:     General: No focal deficit present.     Mental Status: She is alert and oriented to person, place, and time.   ROS Blood pressure 108/83, pulse 89, temperature 98.3 F (36.8 C), temperature source Oral, resp. rate 18, height 5' (1.524 m), weight 53.5 kg, SpO2 100 %. Body mass index is 23.05 kg/m.  Mental Status Per Nursing Assessment::   On Admission:  Suicidal ideation indicated by patient  Demographic Factors:  Caucasian  Loss Factors: NA  Historical Factors: Prior suicide attempts and Impulsivity  Risk Reduction Factors:   Responsible for children under 34 years of age  Continued Clinical Symptoms:  Depression:   Insomnia  Cognitive Features That Contribute To Risk:  None    Suicide Risk:  Minimal: No identifiable suicidal ideation.  Patients presenting with no risk factors but with morbid ruminations; may be classified as minimal risk based on the severity of the depressive symptoms   Follow-up Information     [facility] Follow up.   Specialty: Behavioral Health Why: You may go to this provider for therapy and medication management services.  Please go during walk in hours of Monday through Wednesday from 8:00 am to 11:00 am.   Services are first come, first served. Contact information: [address] [place] [address] [phone]                Plan Of Care/Follow-up recommendations:  Activity:  ad lib  Antonieta Pert, MD 09/13/2020, 10:02 AM

## 2020-09-13 NOTE — Telephone Encounter (Signed)
The original prescription was discontinued on 09/10/2020 by Layla Barter, NP for the following reason: Stop Taking at Discharge. Renewing this prescription may not be appropriate

## 2020-09-20 ENCOUNTER — Telehealth: Payer: Self-pay | Admitting: Neurology

## 2020-09-20 NOTE — Telephone Encounter (Signed)
I called [facility] @ [phone] and spoke with Thayer Ohm to get auth for MRI brain w/wo contrast ([id number]). Thayer Ohm started the case but advised more information is needed. I was advised to fax notes to [phone]. Pending #[id number].  Patient [employer] ID- [id number]

## 2020-09-21 NOTE — Telephone Encounter (Signed)
Received approval from [facility]. PA #[id number] (09/20/20- 11/19/20). Sent to GI. They will call patient to schedule.

## 2020-09-29 ENCOUNTER — Other Ambulatory Visit: Payer: Medicaid Other

## 2020-09-30 DIAGNOSIS — Z419 Encounter for procedure for purposes other than remedying health state, unspecified: Secondary | ICD-10-CM | POA: Diagnosis not present

## 2020-10-12 ENCOUNTER — Other Ambulatory Visit: Payer: Self-pay | Admitting: Registered Nurse

## 2020-10-12 DIAGNOSIS — F411 Generalized anxiety disorder: Secondary | ICD-10-CM

## 2020-10-14 ENCOUNTER — Encounter (HOSPITAL_COMMUNITY): Payer: Self-pay | Admitting: Physician Assistant

## 2020-10-14 ENCOUNTER — Other Ambulatory Visit: Payer: Self-pay

## 2020-10-14 ENCOUNTER — Telehealth (HOSPITAL_COMMUNITY): Payer: Self-pay | Admitting: *Deleted

## 2020-10-14 ENCOUNTER — Ambulatory Visit (INDEPENDENT_AMBULATORY_CARE_PROVIDER_SITE_OTHER): Payer: Medicaid Other | Admitting: Physician Assistant

## 2020-10-14 VITALS — BP 110/66 | HR 77 | Ht 61.0 in | Wt 118.0 lb

## 2020-10-14 DIAGNOSIS — F411 Generalized anxiety disorder: Secondary | ICD-10-CM | POA: Diagnosis not present

## 2020-10-14 DIAGNOSIS — F332 Major depressive disorder, recurrent severe without psychotic features: Secondary | ICD-10-CM | POA: Diagnosis not present

## 2020-10-14 DIAGNOSIS — G4700 Insomnia, unspecified: Secondary | ICD-10-CM

## 2020-10-14 MED ORDER — BUSPIRONE HCL 30 MG PO TABS: 30.0000 mg | ORAL_TABLET | Freq: Two times a day (BID) | ORAL | 1 refills | Status: DC

## 2020-10-14 MED ORDER — CLONAZEPAM 0.5 MG PO TABS: 0.5000 mg | ORAL_TABLET | Freq: Two times a day (BID) | ORAL | 0 refills | Status: DC | PRN

## 2020-10-14 MED ORDER — BUPROPION HCL ER (XL) 300 MG PO TB24: 300.0000 mg | ORAL_TABLET | Freq: Every day | ORAL | 1 refills | Status: DC

## 2020-10-14 MED ORDER — FLUOXETINE HCL 20 MG PO CAPS: 60.0000 mg | ORAL_CAPSULE | Freq: Every morning | ORAL | 1 refills | Status: DC

## 2020-10-14 MED ORDER — HYDROXYZINE HCL 25 MG PO TABS: 25.0000 mg | ORAL_TABLET | Freq: Three times a day (TID) | ORAL | 1 refills | Status: DC | PRN

## 2020-10-14 MED ORDER — QUETIAPINE FUMARATE 50 MG PO TABS: 50.0000 mg | ORAL_TABLET | Freq: Every day | ORAL | 1 refills | Status: DC

## 2020-10-14 NOTE — Telephone Encounter (Signed)
Submitted PA to [facility] for her Quetiapine, waiting up to 24 hours for determination.

## 2020-10-14 NOTE — Telephone Encounter (Signed)
Submitted PA for patients Seroquel. Waiting on a determination that can take up to 24 hours.

## 2020-10-14 NOTE — Progress Notes (Signed)
Psychiatric Initial Adult Assessment   Patient Identification: Tracy Mcconnell MRN:  [id number] Date of Evaluation:  10/14/2020 Referral Source: [facility] Chief Complaint:   Chief Complaint   Medication Management    Visit Diagnosis:    ICD-10-CM   1. Insomnia, unspecified type  G47.00 QUEtiapine (SEROQUEL) 50 MG tablet    2. Generalized anxiety disorder  F41.1 busPIRone (BUSPAR) 30 MG tablet    hydrOXYzine (ATARAX/VISTARIL) 25 MG tablet    FLUoxetine (PROZAC) 20 MG capsule    clonazePAM (KLONOPIN) 0.5 MG tablet    3. Severe episode of recurrent major depressive disorder, without psychotic features (HCC)  F33.2 buPROPion (WELLBUTRIN XL) 300 MG 24 hr tablet    FLUoxetine (PROZAC) 20 MG capsule      History of Present Illness:    Tracy Mcconnell is a 25 year old female with a past psychiatric history significant for generalized anxiety disorder, OCD, depression, and sleep disturbances who [facility].  Patient reports that this appointment is a follow-up in relation to [facility].  Patient reports that she admitted herself on June 11 due to suicidal ideations.  Patient's suicidal thoughts were a direct response to buildup of her stress. Prior to her hospitalization, patient had never been hospitalized due to mental health.  [facility], patient was placed on the following medications:  Bupropion (Wellbutrin XL) 300 mg 24-hour tablet daily Buspirone 30 mg 2 times daily Fluoxetine 60 mg daily Hydroxyzine 25 mg 3 times daily as needed Seroquel 50 mg at bedtime  In regards to her current symptoms, patient states that her brain is always having racing thoughts.  She reports that she is always stressed over something and when she has nothing to stress over, she begins to stress over things that  she could be stressed out about.  Patient rates her anxiety a 10 out of 10.  Patient's anxiety is accompanied by the biting of her nails as well as biting the inside of her mouth.  Social anxiety is also an issue for the patient stating that she does not like being out in public or being seen.  In addition to anxiety, patient endorses panic attacks.  Patient's panic attacks occur roughly 2 times per week and are characterized by the following symptoms: feeling hot, difficulty breathing, restlessness, and inability to function.  Patient is also taking medications for the management of her depression.  Patient's depressive episodes are illustrated by the following symptoms: episodes of sadness, lack of motivation, decreased energy, irritability, sleep disturbances, and feelings of guilt/worthlessness.  Patient expresses that she feels guilty over admitting herself.  She states that her depression is due to being at home and not getting out much.  Patient's main concern today is her anxiety due to the unhealthy habits that accompany the issue.  A GAD-7 screen was performed with the patient scoring a 19.  A PHQ-9 was performed with the patient scoring a 20.  Patient is alert and oriented x4, calm, cooperative, and fully engaged in conversation during the encounter. Patient denies suicidal or homicidal ideations.  She further denies auditory or visual hallucinations and does not appear to be responding to internal/external stimuli.  Patient endorses fair sleep and receives on average 5 hours of intermittent sleep.  Patient endorses fair appetite and eats on average 2 meals per day.  She expresses that she often loses her appetite quickly.  Patient  endorses alcohol consumption sparingly.  Patient denies tobacco use.  Patient endorses illicit drug use in the form of marijuana daily.  Associated Signs/Symptoms: Depression Symptoms:  depressed mood, anhedonia, insomnia, psychomotor agitation, psychomotor  retardation, fatigue, feelings of worthlessness/guilt, difficulty concentrating, impaired memory, recurrent thoughts of death, anxiety, panic attacks, loss of energy/fatigue, disturbed sleep, decreased appetite, (Hypo) Manic Symptoms:  Distractibility, Flight of Ideas, Licensed conveyancer, Impulsivity, Irritable Mood, Labiality of Mood, Anxiety Symptoms:  Agoraphobia, Excessive Worry, Panic Symptoms, Obsessive Compulsive Symptoms:   Biting fingernails, tap fingers, tap teeth together, Social Anxiety, Specific Phobias, Psychotic Symptoms:  Paranoia, PTSD Symptoms: Had a traumatic exposure:  Patient reports emotional trauma. Patient states that week and half ago her little sister tried to kill herself. Patient had a really bad panic attack related to her sister being picked up. Patient states that she took care of her two younger siblings growing up. Patient also cites that the death of her grand father last year and the death of her grandmother on mother's day as events that have deeply affected her. Had a traumatic exposure in the last month:  n/a Re-experiencing:  Intrusive Thoughts Nightmares Hypervigilance:  Yes Hyperarousal:  Emotional Numbness/Detachment Increased Startle Response Irritability/Anger Avoidance:  Decreased Interest/Participation Foreshortened Future  Past Psychiatric History:  Generalized anxiety disorder OCD Depression Sleep disturbances  Previous Psychotropic Medications: Yes   Substance Abuse History in the last 12 months:  Yes.    Consequences of Substance Abuse: Medical Consequences:  None Legal Consequences:  None Family Consequences:  None Blackouts:  N/A DT's: N/A Withdrawal Symptoms:   None  Past Medical History:  Past Medical History:  Diagnosis Date   Anemia    Anxiety    Depression    GERD (gastroesophageal reflux disease)    Hypovitaminosis D 04/07/2019   Left eye complaint 06/08/2019   Migraines     Past Surgical  History:  Procedure Laterality Date   CHOLECYSTECTOMY N/A 10/19/2019   Procedure: LAPAROSCOPIC CHOLECYSTECTOMY;  Surgeon: Berna Bue, MD;  Location: [facility];  Service: General;  Laterality: N/A;   LAPAROSCOPIC APPENDECTOMY N/A 01/17/2020   Procedure: APPENDECTOMY LAPAROSCOPIC;  Surgeon: Harriette Bouillon, MD;  Location: [facility];  Service: General;  Laterality: N/A;    Family Psychiatric History:  Mother - bipolar depression, PTSD  Family History:  Family History  Problem Relation Age of Onset   Migraines Mother    Thyroid disease Mother    Cancer Maternal Grandmother    Diabetes Paternal Grandfather    Hypertension Maternal Grandfather    Diabetes Paternal Grandmother    Stroke Paternal Grandmother     Social History:   Social History   Socioeconomic History   Marital status: Significant Other    Spouse name: Not on file   Number of children: 0   Years of education: college student   Highest education level: Not on file  Occupational History   Not on file  Tobacco Use   Smoking status: Never   Smokeless tobacco: Never  Vaping Use   Vaping Use: Never used  Substance and Sexual Activity   Alcohol use: No   Drug use: No   Sexual activity: Not Currently  Other Topics Concern   Not on file  Social History Narrative   Lives at home alone   Right handed   Does not drink caffeine, makes her nauseated   Social Determinants of Health   Financial Resource Strain: Not on file  Food Insecurity: Not on file  Transportation Needs: Not  on file  Physical Activity: Not on file  Stress: Not on file  Social Connections: Not on file    Additional Social History:  Patient is currently unemployed  Allergies:   Allergies  Allergen Reactions   Sumatriptan Shortness Of Breath and Other (See Comments)    Entire body "felt heavy"   Other Itching, Swelling and Other (See Comments)    Pesticides (family history)- Exposure face and mouth itch and the tongue/lips swell     Metabolic Disorder Labs: Lab Results  Component Value Date   HGBA1C 5.2 09/11/2020   MPG 103 09/11/2020   MPG 100 09/09/2020   Lab Results  Component Value Date   PROLACTIN 36.5 (H) 09/09/2020   Lab Results  Component Value Date   CHOL 192 09/09/2020   TRIG 68 09/09/2020   HDL 50 09/09/2020   CHOLHDL 3.8 09/09/2020   VLDL 14 09/09/2020   LDLCALC 128 (H) 09/09/2020   Lab Results  Component Value Date   TSH 2.000 09/09/2020    Therapeutic Level Labs: No results found for: LITHIUM No results found for: CBMZ No results found for: VALPROATE  Current Medications: Current Outpatient Medications  Medication Sig Dispense Refill   clonazePAM (KLONOPIN) 0.5 MG tablet Take 1 tablet (0.5 mg total) by mouth 2 (two) times daily as needed for anxiety. 60 tablet 0   acetaminophen (TYLENOL) 325 MG tablet Take 2 tablets (650 mg total) by mouth every 6 (six) hours as needed for mild pain (or temp > 100).     buPROPion (WELLBUTRIN XL) 300 MG 24 hr tablet Take 1 tablet (300 mg total) by mouth daily. 30 tablet 1   busPIRone (BUSPAR) 30 MG tablet Take 1 tablet (30 mg total) by mouth 2 (two) times daily. 60 tablet 1   cyclobenzaprine (FLEXERIL) 5 MG tablet TAKE 1 TABLET BY MOUTH THREE TIMES A DAY AS NEEDED FOR MUSCLE SPASMS 30 tablet 1   Dimethyl Fumarate (TECFIDERA) 240 MG CPDR Take 240 mg by mouth daily.     FLUoxetine (PROZAC) 20 MG capsule Take 3 capsules (60 mg total) by mouth in the morning. 30 capsule 1   gabapentin (NEURONTIN) 400 MG capsule Take 1 capsule (400 mg total) by mouth 4 (four) times daily -  with meals and at bedtime. 120 capsule 0   hydrOXYzine (ATARAX/VISTARIL) 25 MG tablet Take 1 tablet (25 mg total) by mouth 3 (three) times daily as needed for anxiety. 30 tablet 1   indomethacin (INDOCIN) 25 MG capsule Take 1 capsule (25 mg total) by mouth daily as needed for moderate pain. 30 capsule 0   ondansetron (ZOFRAN ODT) 4 MG disintegrating tablet Take 1 tablet (4 mg total) by  mouth every 8 (eight) hours as needed for nausea or vomiting. 20 tablet 0   potassium chloride SA (KLOR-CON) 20 MEQ tablet Take 1 tablet (20 mEq total) by mouth daily. 7 tablet 0   QUEtiapine (SEROQUEL) 50 MG tablet Take 1 tablet (50 mg total) by mouth at bedtime. 30 tablet 1   Ubrogepant (UBRELVY) 100 MG TABS Take 100 mg by mouth daily as needed. Take one tablet at onset of headache, may repeat 1 tablet in 2 hours, no more than 2 tablets in 24 hours 10 tablet 11   No current facility-administered medications for this visit.    Musculoskeletal: Strength & Muscle Tone: within normal limits Gait & Station: normal Patient leans: N/A  Psychiatric Specialty Exam: Review of Systems  Psychiatric/Behavioral:  Positive for sleep disturbance. Negative for  decreased concentration, dysphoric mood, hallucinations, self-injury and suicidal ideas. The patient is nervous/anxious. The patient is not hyperactive.    Blood pressure 110/66, pulse 77, height 5\' 1"  (1.549 m), weight 118 lb (53.5 kg), SpO2 100 %.Body mass index is 22.3 kg/m.  General Appearance: Well Groomed  Eye Contact:  Good  Speech:  Clear and Coherent and Normal Rate  Volume:  Normal  Mood:  Anxious and Depressed  Affect:  Congruent and Depressed  Thought Process:  Coherent, Goal Directed, and Descriptions of Associations: Intact  Orientation:  Full (Time, Place, and Person)  Thought Content:  WDL  Suicidal Thoughts:  No  Homicidal Thoughts:  No  Memory:  Immediate;   Good Recent;   Good Remote;   Good  Judgement:  Good  Insight:  Fair  Psychomotor Activity:  Normal  Concentration:  Concentration: Good and Attention Span: Good  Recall:  Good  Fund of Knowledge:Good  Language: Good  Akathisia:  NA  Handed:  Right  AIMS (if indicated):  not done  Assets:  Communication Skills Desire for Improvement Housing Social Support  ADL's:  Intact  Cognition: WNL  Sleep:  Fair   Screenings: AUDIT    Flowsheet Row [facility]  Alcohol Use Disorder Identification Test Final Score (AUDIT) 0      GAD-7    Flowsheet Row Office Visit from 10/14/2020 in [facility] Office Visit from 11/11/2019 in Primary Care at [facility] Visit from 06/17/2019 in Primary Care at [facility] Visit from 06/05/2019 in Primary Care at [facility] Visit from 04/07/2019 in Primary Care at [facility]  Total GAD-7 Score 19 8 0 0 15      PHQ2-9    Flowsheet Row Office Visit from 10/14/2020 in [facility] Office Visit from 04/11/2020 in Primary Care at [facility] Visit from 03/22/2020 in Primary Care at [facility] Visit from 12/28/2019 in Primary Care at [facility] Visit from 11/11/2019 in Primary Care at [facility] Total Score 4 0 0 0 0  PHQ-9 Total Score 20 -- -- -- --      Flowsheet Row Office Visit from 10/14/2020 in [facility] Admission (Discharged) [facility] ED from 09/09/2020 in [facility]  C-SSRS RISK CATEGORY Low Risk Moderate Risk High Risk       Assessment and Plan:   Tracy Mcconnell is a 25 year old female with a past psychiatric history significant for generalized anxiety disorder, OCD, depression, and sleep disturbances who presents to [facility] for medication management.  Although patient endorses depressive symptoms, her main concern is her worsening anxiety.  Patient's anxiety is accompanied by the biting of her nails as well as the biting of the inside of her mouth.  Patient was prescribed a number of medications after her discharge from [facility] and she is requesting refills on all of them.  Patient denies the need for dosage adjustments at this time.  Patient's medications to be e-prescribed to pharmacy of choice.  1.  Generalized anxiety disorder  - busPIRone (BUSPAR) 30 MG tablet; Take 1 tablet (30 mg total) by mouth 2 (two) times daily.  Dispense: 60 tablet; Refill: 1 - hydrOXYzine (ATARAX/VISTARIL) 25 MG tablet; Take 1 tablet (25 mg total) by mouth 3 (three) times daily as needed for anxiety.  Dispense: 30 tablet; Refill: 1 -  FLUoxetine (PROZAC) 20 MG capsule; Take 3 capsules (60 mg total) by mouth in the morning.  Dispense: 30 capsule; Refill: 1 - clonazePAM (KLONOPIN) 0.5 MG tablet; Take 1 tablet (0.5 mg total) by mouth 2 (two) times daily as needed for anxiety.  Dispense: 60 tablet; Refill: 0  2. Insomnia, unspecified type  - QUEtiapine (SEROQUEL) 50 MG tablet; Take 1 tablet (50 mg total) by mouth at bedtime.  Dispense: 30 tablet; Refill: 1  3. Severe episode of recurrent major depressive disorder, without psychotic features (HCC)  - buPROPion (WELLBUTRIN XL) 300 MG 24 hr tablet; Take 1 tablet (300 mg total) by mouth daily.  Dispense: 30 tablet; Refill: 1 - FLUoxetine (PROZAC) 20 MG capsule; Take 3 capsules (60 mg total) by mouth in the morning.  Dispense: 30 capsule; Refill: 1  Patient to follow up in 7 weeks Provider spent a total of 50 minutes with the patient/reviewing patient's chart  Meta Hatchet, PA 9/5/20226:56 AM

## 2020-10-15 ENCOUNTER — Encounter (HOSPITAL_COMMUNITY): Payer: Self-pay | Admitting: Emergency Medicine

## 2020-10-15 ENCOUNTER — Other Ambulatory Visit: Payer: Self-pay

## 2020-10-15 ENCOUNTER — Emergency Department (HOSPITAL_COMMUNITY): Payer: Medicaid Other

## 2020-10-15 ENCOUNTER — Emergency Department (HOSPITAL_COMMUNITY)
Admission: EM | Admit: 2020-10-15 | Discharge: 2020-10-16 | Disposition: A | Discharge status: Home / Self Care | Payer: Medicaid Other | Attending: Emergency Medicine | Admitting: Emergency Medicine

## 2020-10-15 DIAGNOSIS — R531 Weakness: Secondary | ICD-10-CM | POA: Insufficient documentation

## 2020-10-15 DIAGNOSIS — R202 Paresthesia of skin: Secondary | ICD-10-CM | POA: Insufficient documentation

## 2020-10-15 DIAGNOSIS — Z79899 Other long term (current) drug therapy: Secondary | ICD-10-CM | POA: Diagnosis not present

## 2020-10-15 DIAGNOSIS — Z8669 Personal history of other diseases of the nervous system and sense organs: Secondary | ICD-10-CM

## 2020-10-15 DIAGNOSIS — R200 Anesthesia of skin: Secondary | ICD-10-CM | POA: Diagnosis not present

## 2020-10-15 DIAGNOSIS — H538 Other visual disturbances: Secondary | ICD-10-CM | POA: Insufficient documentation

## 2020-10-15 DIAGNOSIS — G35 Multiple sclerosis: Secondary | ICD-10-CM | POA: Diagnosis not present

## 2020-10-15 DIAGNOSIS — G9389 Other specified disorders of brain: Secondary | ICD-10-CM | POA: Diagnosis not present

## 2020-10-15 DIAGNOSIS — R0602 Shortness of breath: Secondary | ICD-10-CM | POA: Diagnosis not present

## 2020-10-15 LAB — RAPID URINE DRUG SCREEN, HOSP PERFORMED
Amphetamines: NOT DETECTED
Barbiturates: NOT DETECTED
Benzodiazepines: NOT DETECTED
Cocaine: NOT DETECTED
Opiates: NOT DETECTED
Tetrahydrocannabinol: POSITIVE — AB

## 2020-10-15 LAB — I-STAT CHEM 8, ED
BUN: 13 mg/dL (ref 6–20)
Calcium, Ion: 1.23 mmol/L (ref 1.15–1.40)
Chloride: 102 mmol/L (ref 98–111)
Creatinine, Ser: 0.6 mg/dL (ref 0.44–1.00)
Glucose, Bld: 78 mg/dL (ref 70–99)
HCT: 38 % (ref 36.0–46.0)
Hemoglobin: 12.9 g/dL (ref 12.0–15.0)
Potassium: 3.8 mmol/L (ref 3.5–5.1)
Sodium: 138 mmol/L (ref 135–145)
TCO2: 24 mmol/L (ref 22–32)

## 2020-10-15 LAB — CBC WITH DIFFERENTIAL/PLATELET
Abs Immature Granulocytes: 0.06 10*3/uL (ref 0.00–0.07)
Basophils Absolute: 0 10*3/uL (ref 0.0–0.1)
Basophils Relative: 0 %
Eosinophils Absolute: 0.2 10*3/uL (ref 0.0–0.5)
Eosinophils Relative: 2 %
HCT: 38.1 % (ref 36.0–46.0)
Hemoglobin: 12.8 g/dL (ref 12.0–15.0)
Immature Granulocytes: 1 %
Lymphocytes Relative: 23 %
Lymphs Abs: 2.5 10*3/uL (ref 0.7–4.0)
MCH: 29.8 pg (ref 26.0–34.0)
MCHC: 33.6 g/dL (ref 30.0–36.0)
MCV: 88.8 fL (ref 80.0–100.0)
Monocytes Absolute: 0.8 10*3/uL (ref 0.1–1.0)
Monocytes Relative: 7 %
Neutro Abs: 7.4 10*3/uL (ref 1.7–7.7)
Neutrophils Relative %: 67 %
Platelets: 354 10*3/uL (ref 150–400)
RBC: 4.29 MIL/uL (ref 3.87–5.11)
RDW: 11.9 % (ref 11.5–15.5)
WBC: 11 10*3/uL — ABNORMAL HIGH (ref 4.0–10.5)
nRBC: 0 % (ref 0.0–0.2)

## 2020-10-15 LAB — COMPREHENSIVE METABOLIC PANEL
ALT: 9 U/L (ref 0–44)
AST: 17 U/L (ref 15–41)
Albumin: 3.8 g/dL (ref 3.5–5.0)
Alkaline Phosphatase: 70 U/L (ref 38–126)
Anion gap: 8 (ref 5–15)
BUN: 12 mg/dL (ref 6–20)
CO2: 24 mmol/L (ref 22–32)
Calcium: 9.6 mg/dL (ref 8.9–10.3)
Chloride: 103 mmol/L (ref 98–111)
Creatinine, Ser: 0.61 mg/dL (ref 0.44–1.00)
GFR, Estimated: >60 mL/min (ref >60)
Glucose, Bld: 77 mg/dL (ref 70–99)
Potassium: 3.8 mmol/L (ref 3.5–5.1)
Sodium: 135 mmol/L (ref 135–145)
Total Bilirubin: 0.5 mg/dL (ref 0.3–1.2)
Total Protein: 6.7 g/dL (ref 6.5–8.1)

## 2020-10-15 LAB — URINALYSIS, ROUTINE W REFLEX MICROSCOPIC
Bilirubin Urine: NEGATIVE
Glucose, UA: NEGATIVE mg/dL
Ketones, ur: NEGATIVE mg/dL
Nitrite: NEGATIVE
Protein, ur: NEGATIVE mg/dL
Specific Gravity, Urine: 1.004 — ABNORMAL LOW (ref 1.005–1.030)
pH: 6 (ref 5.0–8.0)

## 2020-10-15 LAB — MAGNESIUM: Magnesium: 2 mg/dL (ref 1.7–2.4)

## 2020-10-15 LAB — I-STAT BETA HCG BLOOD, ED (MC, WL, AP ONLY): I-stat hCG, quantitative: <5 m[IU]/mL (ref <5)

## 2020-10-15 NOTE — ED Provider Notes (Signed)
Emergency Medicine Provider Triage Evaluation Note  Tracy Mcconnell , a 25 y.o. female  was evaluated in triage.  Pt complains of weakness beginning around 8 PM this evening.  She endorses onset of weakness and numbness in the bilateral upper and lower extremities, mild difficulty breathing, neck tightness.  Review of Systems  Positive: Weakness and numbness in extremities Negative: Trauma, fever, vision loss, drooling, chest pain  Physical Exam  BP 114/75 (BP Location: Left Arm)   Pulse 99   Temp 98 F (36.7 C) (Oral)   Resp 16   SpO2 98%  Gen:   Awake, no distress   Resp:  Normal effort.  No increased work of breathing.  Speaks in full sentences without noted difficulty.  Handles oral secretions without noted difficulty.  No noted phonation abnormality. MSK:    Other:  Notable weakness in the bilateral upper and lower extremities, worse in the upper extremities.  No facial droop.  Medical Decision Making  Medically screening exam initiated at 9:33 PM.  Appropriate orders placed.  Tracy Mcconnell was informed that the remainder of the evaluation will be completed by another provider, this initial triage assessment does not replace that evaluation, and the importance of remaining in the ED until their evaluation is complete.   I reviewed the patient's recent records.  She seems to have presented with an episode of numbness in February 2022.  MRIs at that time showed demyelinating brain lesions, but apparently no spinal cord lesions.  Patient states she was not given an official diagnosis of MS since she had no lesions in the spinal cord.   Concepcion Living 10/15/20 [id number]    [name], MD 10/15/20 [phone]

## 2020-10-15 NOTE — ED Triage Notes (Signed)
Pt reports at 8pm she was in bed and began to feel "heaviness" in all her extremities and a numbness feeling on her left side.   No other neuro symptoms at this time.  Provider to see in triage.

## 2020-10-15 NOTE — ED Provider Notes (Signed)
[facility] EMERGENCY DEPARTMENT Provider Note   CSN: [id number] Arrival date & time: 10/15/20  2046     History Chief Complaint  Patient presents with   Weakness   left sided numbness    Lilyana J Ramsey-Peralta is a 25 y.o. female.  Patient is a 25 year old female with past medical history of anxiety, anemia, GERD, migraines.  Patient presenting today for evaluation of left-sided numbness.  Patient describes onset of numb sensation to her left arm and left leg and feeling as if her left arm is "weighted down".  This started at approximately 8 PM this evening.  Her left leg feels the same, but to a lesser extent.  She denies any bowel or bladder complaints.  She describes blurry vision to the left eye.  Patient underwent MRI of the brain and cervical spine in February of this year showing what appears to be some sort of demyelinating process.  Patient is awaiting referral to a neurologist.  She was due to have a repeat MRI in the next week.  The history is provided by the patient.  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   Severe episode of recurrent major depressive disorder, without psychotic features (HCC) 09/10/2020   Chronic migraine without aura without status migrainosus, not intractable 09/08/2020   Myalgia 09/08/2020   Acute appendicitis 01/17/2020   Abdominal pain 01/16/2020   Left lumbar radiculitis 07/24/2019   Vitamin D deficiency 07/15/2019   White matter abnormality on MRI of brain  06/25/2019   Numbness 06/25/2019   Blurry vision    Generalized anxiety disorder    Optic neuritis 06/10/2019   Hypovitaminosis D 04/07/2019   Migraines 10/28/2018   Lumbar radiculopathy 10/28/2018   Chronic daily headache 02/04/2017    Past Surgical History:  Procedure Laterality Date   CHOLECYSTECTOMY N/A 10/19/2019   Procedure: LAPAROSCOPIC CHOLECYSTECTOMY;  Surgeon: Berna Bue, MD;  Location: [facility];  Service: General;  Laterality: N/A;   LAPAROSCOPIC APPENDECTOMY N/A 01/17/2020   Procedure: APPENDECTOMY LAPAROSCOPIC;  Surgeon: Harriette Bouillon, MD;  Location: [facility];  Service: General;  Laterality: N/A;     OB History   No obstetric history on file.     Family History  Problem Relation Age of Onset   Migraines Mother    Thyroid disease Mother    Cancer Maternal Grandmother    Diabetes Paternal Grandfather    Hypertension Maternal Grandfather    Diabetes Paternal Grandmother    Stroke Paternal Grandmother     Social History   Tobacco Use   Smoking status: Never   Smokeless tobacco: Never  Vaping Use   Vaping Use: Never used  Substance Use Topics   Alcohol use: No   Drug use: No    Home Medications Prior to Admission medications   Medication Sig Start Date End Date Taking? Authorizing Provider  acetaminophen (TYLENOL) 325 MG tablet Take 2 tablets (650 mg total) by mouth every 6 (six) hours as needed for mild  pain (or temp > 100). 01/18/20   Meuth, Brooke A, PA-C  buPROPion (WELLBUTRIN XL) 300 MG 24 hr tablet Take 1 tablet (300 mg total) by mouth daily. 10/14/20   Nwoko, Tommas Olp, PA  busPIRone (BUSPAR) 30 MG tablet Take 1 tablet (30 mg total) by mouth 2 (two) times daily. 10/14/20   Nwoko, Tommas Olp, PA  clonazePAM (KLONOPIN) 0.5 MG tablet Take 1 tablet (0.5 mg total) by mouth 2 (two) times daily as needed for anxiety. 10/14/20 10/14/21  Nwoko, Tommas Olp, PA  cyclobenzaprine (FLEXERIL) 5 MG tablet TAKE 1 TABLET BY MOUTH THREE TIMES A DAY AS NEEDED FOR MUSCLE  SPASMS 09/13/20   Janeece Agee, NP  FLUoxetine (PROZAC) 20 MG capsule Take 3 capsules (60 mg total) by mouth in the morning. 10/14/20   Nwoko, Tommas Olp, PA  gabapentin (NEURONTIN) 400 MG capsule Take 1 capsule (400 mg total) by mouth 4 (four) times daily -  with meals and at bedtime. 09/13/20   Laveda Abbe, NP  hydrOXYzine (ATARAX/VISTARIL) 25 MG tablet Take 1 tablet (25 mg total) by mouth 3 (three) times daily as needed for anxiety. 10/14/20   Nwoko, Tommas Olp, PA  indomethacin (INDOCIN) 25 MG capsule Take 1 capsule (25 mg total) by mouth daily as needed for moderate pain. 09/13/20   Laveda Abbe, NP  QUEtiapine (SEROQUEL) 50 MG tablet Take 1 tablet (50 mg total) by mouth at bedtime. 10/14/20   Nwoko, Tommas Olp, PA  Ubrogepant (UBRELVY) 100 MG TABS Take 100 mg by mouth daily as needed. Take one tablet at onset of headache, may repeat 1 tablet in 2 hours, no more than 2 tablets in 24 hours 04/21/20   Lomax, Amy, NP  lamoTRIgine (LAMICTAL) 25 MG tablet Take 1 tablet at night for 5 days, then 1 in the morning and 1 tablet at night for 5 days, then 1 in the morning and 2 tablets at night for 5 days. Then you can go to 2 tablets in the morning and 2 tablets in the evening thereafter 06/06/20 09/10/20  Sater, Pearletha Furl, MD  omeprazole (PRILOSEC) 20 MG capsule Take 1 capsule (20 mg total) by mouth daily. 10/11/19 09/10/20  Junie Spencer, FNP    Allergies    Sumatriptan and Other  Review of Systems   Review of Systems  Neurological:  Positive for weakness.  All other systems reviewed and are negative.  Physical Exam Updated Vital Signs BP 92/80   Pulse 94   Temp 98 F (36.7 C) (Oral)   Resp 18   Ht 5\' 1"  (1.549 m)   Wt 53.5 kg   SpO2 98%   BMI 22.29 kg/m   Physical Exam Vitals and nursing note reviewed.  Constitutional:      General: She is not in acute distress.    Appearance: She is well-developed. She is not diaphoretic.  HENT:     Head: Normocephalic and atraumatic.   Eyes:     Extraocular Movements: Extraocular movements intact.     Pupils: Pupils are equal, round, and reactive to light.  Cardiovascular:     Rate and Rhythm: Normal rate and regular rhythm.     Heart sounds: No murmur heard.   No friction rub. No gallop.  Pulmonary:     Effort: Pulmonary effort is normal. No respiratory distress.     Breath sounds: Normal breath sounds. No wheezing.  Abdominal:     General: Bowel sounds are normal. There is no distension.  Palpations: Abdomen is soft.     Tenderness: There is no abdominal tenderness.  Musculoskeletal:        General: Normal range of motion.     Cervical back: Normal range of motion and neck supple.  Skin:    General: Skin is warm and dry.  Neurological:     Mental Status: She is alert and oriented to person, place, and time.     Cranial Nerves: No cranial nerve deficit.     Sensory: No sensory deficit.     Motor: Weakness present.     Coordination: Coordination normal.     Gait: Gait normal.     Comments: Patient does appear to have decreased handgrip and biceps flexion in the left arm.  She also has diminished strength in the left leg.    ED Results / Procedures / Treatments   Labs (all labs ordered are listed, but only abnormal results are displayed) Labs Reviewed  CBC WITH DIFFERENTIAL/PLATELET - Abnormal; Notable for the following components:      Result Value   WBC 11.0 (*)    All other components within normal limits  COMPREHENSIVE METABOLIC PANEL  MAGNESIUM  URINALYSIS, ROUTINE W REFLEX MICROSCOPIC  RAPID URINE DRUG SCREEN, HOSP PERFORMED  I-STAT CHEM 8, ED  I-STAT BETA HCG BLOOD, ED ([facility], [facility], [facility] ONLY)    EKG EKG Interpretation  Date/Time:  Saturday October 15 2020 22:21:01 EDT Ventricular Rate:  91 PR Interval:  138 QRS Duration: 88 QT Interval:  378 QTC Calculation: 466 R Axis:   74 Text Interpretation: Sinus rhythm Borderline T abnormalities, anterior leads No significant change since 09/10/2020  Confirmed by Geoffery Lyons ([id number]) on 10/15/2020 11:09:43 PM  Radiology DG Chest Portable 1 View  Result Date: 10/15/2020 CLINICAL DATA:  Shortness of breath EXAM: PORTABLE CHEST 1 VIEW COMPARISON:  10/22/2019 FINDINGS: Single frontal view of the chest demonstrates a stable cardiac silhouette. No acute airspace disease, effusion, or pneumothorax. No acute bony abnormalities. IMPRESSION: 1. No acute intrathoracic process. Electronically Signed   By: Sharlet Salina M.D.   On: 10/15/2020 22:12    Procedures Procedures   Medications Ordered in ED Medications - No data to display  ED Course  I have reviewed the triage vital signs and the nursing notes.  Pertinent labs & imaging results that were available during my care of the patient were reviewed by me and considered in my medical decision making (see chart for details).    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  She presents today with a several hour history of numbness to her left arm and the leg.  She has slight weakness to the left arm and leg, but no cranial nerve abnormalities.  Work-up initiated including laboratory studies and MRI of the brain and cervical spine with and without contrast.  This shows the white matter lesions to be unchanged from previous MRI.  This patient was discussed with Dr. Otelia Limes from neurology.  He does not feel as though any acute treatment or intervention is indicated.  He does recommend prompt follow-up with neurology.  Patient has an upcoming appointment with [facility] neurology, however this is not until September.  I will place a referral to hopefully expedite her follow-up appointment.  Final Clinical Impression(s) / ED Diagnoses Final diagnoses:  None    Rx / DC Orders ED Discharge Orders     None        Geoffery Lyons, MD  10/16/20 0234  

## 2020-10-16 DIAGNOSIS — G35 Multiple sclerosis: Secondary | ICD-10-CM | POA: Diagnosis not present

## 2020-10-16 DIAGNOSIS — G9389 Other specified disorders of brain: Secondary | ICD-10-CM | POA: Diagnosis not present

## 2020-10-16 MED ORDER — GADOBUTROL 1 MMOL/ML IV SOLN
5.0000 mL | Freq: Once | INTRAVENOUS | Status: AC | PRN
  Administered 2020-10-16: 5 mL via INTRAVENOUS

## 2020-10-16 NOTE — Discharge Instructions (Addendum)
Follow-up with [facility] neurology.  A referral has been placed to make these arrangements.  Return to the ER if symptoms significantly worsen or change.

## 2020-10-19 ENCOUNTER — Encounter: Payer: Self-pay | Admitting: Neurology

## 2020-10-19 ENCOUNTER — Ambulatory Visit: Payer: Medicaid Other | Admitting: Neurology

## 2020-10-19 VITALS — BP 104/70 | HR 83 | Ht 61.0 in | Wt 117.0 lb

## 2020-10-19 DIAGNOSIS — Z8669 Personal history of other diseases of the nervous system and sense organs: Secondary | ICD-10-CM | POA: Diagnosis not present

## 2020-10-19 DIAGNOSIS — G35 Multiple sclerosis: Secondary | ICD-10-CM

## 2020-10-19 DIAGNOSIS — G43709 Chronic migraine without aura, not intractable, without status migrainosus: Secondary | ICD-10-CM

## 2020-10-19 DIAGNOSIS — G35A Relapsing-remitting multiple sclerosis: Secondary | ICD-10-CM | POA: Insufficient documentation

## 2020-10-19 DIAGNOSIS — R200 Anesthesia of skin: Secondary | ICD-10-CM | POA: Diagnosis not present

## 2020-10-19 NOTE — Progress Notes (Signed)
[facility]  PATIENT: Tracy Mcconnell DOB: 06/17/1995  REFERRING DOCTOR OR PCP: PCP is Janeece Agee SOURCE: Patient, notes from neurology and hospital, imaging and lab reports, MRI images personally reviewed  _________________________________   HISTORICAL  CHIEF COMPLAINT:  Chief Complaint  Patient presents with   Follow-up    New room, alone. Last seen 09/08/20. Recently seen in ED. Left side very weak intermittently. Has had near falls. Numbness is constant. Denies any other sx.    HISTORY OF PRESENT ILLNESS:  Tracy Mcconnell is a 25 y.o. woman with left optic neuritis and abnormal brain MRi  Update 7/202022: She was n the ED a couple weeks ago due to ataxia and numbness on her right.  She reports her gait was off.  She has noted she is tripping more.  MRI of the brain and cervical spine showed no acute findings.  The numbness has been fluctuating but she has nonenow.   She feels it was worse x 2 days.   There does seem to be a small T2/Flair hyperintensity in the right frontal subcortical white matter not present on 12/2019 or earlier MRI.  It does not appear to be acute.  It is very small and would not cause any symptoms.  The previous 2 MRIs did not show any new lesions.  Migraines are doing much better since she had Botox (08/02/2020)   She is now just having a few a month .   Nurtec works better than [place] but insurance will not cover it.    Bright lights or sunlight triggers a migraine.   Two headaches the past week had N/V.    She has no new visual changes.   She had left ON and occasioanlly she notes left visula blurring.   COlors are desaturated OS.       She was having migraine headaches about 15 days a month.  Nurtec helps the most when one occurs.   Botox has helped more than Ajovy.      Multiple sclerosis/ History: In February 2021, she had the onset of left eye blurry vision and pain, worse with eye movements.    She saw urgent  care and then ophthalmology   She was felt to have optic neuritis and presented to the [facility] ED.   MRI of the brain and orbits showed enhancement of the left optic nerve,   There were a few T2 hyperintense foci in the hemispheres, including a periventricular focus in the right frontal lobe and one juxtacortical focus in the right parietal lobe.  None of the brain foci enhanced.  In retrospect, she has numbness and weakness in her legs that lasted a week, left > right in November 2019.   She had an LP 07/01/2019 and CSF showed no OCB and normal IgG Index   Imaging: MRI of the brain 10/15/2020 was unchanged  MRI cervical spine 10/15/2020 was normal  MRI of the brain 05/23/2020 showed scattered T2/FLAIR hyperintense foci including 2 in the periventricular white matter and 1 in the juxtacortical white matter.  None of the foci enhance.  Compared to the MRI of the brain from 12/24/2019 and 06/10/2019, there are no new lesions.  MRI of the cervical spine 05/23/2020 showed a normal spinal cord.  No degenerative changes.  MRI of the orbits 05/23/2020 showed no enhancing lesions.  There was slight hyperintensity of the left optic nerve consistent with the previous optic neuritis.  Her maternal aunt and two second cousins have  MS.    REVIEW OF SYSTEMS: Constitutional: No fevers, chills, sweats, or change in appetite Eyes: No visual changes, double vision, eye pain Ear, nose and throat: No hearing loss, ear pain, nasal congestion, sore throat Cardiovascular: No chest pain, palpitations Respiratory:  No shortness of breath at rest or with exertion.   No wheezes GastrointestinaI: No nausea, vomiting, diarrhea, abdominal pain, fecal incontinence Genitourinary:  No dysuria, urinary retention or frequency.  No nocturia. Musculoskeletal:  No neck pain, back pain Integumentary: No rash, pruritus, skin lesions Neurological: as above Psychiatric: No depression at this time.  No anxiety Endocrine: No  palpitations, diaphoresis, change in appetite, change in weigh or increased thirst Hematologic/Lymphatic:  No anemia, purpura, petechiae. Allergic/Immunologic: No itchy/runny eyes, nasal congestion, recent allergic reactions, rashes  ALLERGIES: Allergies  Allergen Reactions   Sumatriptan Shortness Of Breath and Other (See Comments)    Entire body "felt heavy"   Other Itching, Swelling and Other (See Comments)    Pesticides (family history)- Exposure face and mouth itch and the tongue/lips swell    HOME MEDICATIONS:  Current Outpatient Medications:    acetaminophen (TYLENOL) 325 MG tablet, Take 2 tablets (650 mg total) by mouth every 6 (six) hours as needed for mild pain (or temp > 100)., Disp: , Rfl:    buPROPion (WELLBUTRIN XL) 300 MG 24 hr tablet, Take 1 tablet (300 mg total) by mouth daily., Disp: 30 tablet, Rfl: 1   busPIRone (BUSPAR) 30 MG tablet, Take 1 tablet (30 mg total) by mouth 2 (two) times daily., Disp: 60 tablet, Rfl: 1   clonazePAM (KLONOPIN) 0.5 MG tablet, Take 1 tablet (0.5 mg total) by mouth 2 (two) times daily as needed for anxiety., Disp: 60 tablet, Rfl: 0   cyclobenzaprine (FLEXERIL) 5 MG tablet, TAKE 1 TABLET BY MOUTH THREE TIMES A DAY AS NEEDED FOR MUSCLE SPASMS, Disp: 30 tablet, Rfl: 1   FLUoxetine (PROZAC) 20 MG capsule, Take 3 capsules (60 mg total) by mouth in the morning., Disp: 30 capsule, Rfl: 1   gabapentin (NEURONTIN) 400 MG capsule, Take 1 capsule (400 mg total) by mouth 4 (four) times daily -  with meals and at bedtime., Disp: 120 capsule, Rfl: 0   hydrOXYzine (ATARAX/VISTARIL) 25 MG tablet, Take 1 tablet (25 mg total) by mouth 3 (three) times daily as needed for anxiety., Disp: 30 tablet, Rfl: 1   indomethacin (INDOCIN) 25 MG capsule, Take 1 capsule (25 mg total) by mouth daily as needed for moderate pain., Disp: 30 capsule, Rfl: 0   QUEtiapine (SEROQUEL) 50 MG tablet, Take 1 tablet (50 mg total) by mouth at bedtime., Disp: 30 tablet, Rfl: 1   Ubrogepant  (UBRELVY) 100 MG TABS, Take 100 mg by mouth daily as needed. Take one tablet at onset of headache, may repeat 1 tablet in 2 hours, no more than 2 tablets in 24 hours, Disp: 10 tablet, Rfl: 11  PAST MEDICAL HISTORY: Past Medical History:  Diagnosis Date   Anemia    Anxiety    Depression    GERD (gastroesophageal reflux disease)    Hypovitaminosis D 04/07/2019   Left eye complaint 06/08/2019   Migraines     PAST SURGICAL HISTORY: Past Surgical History:  Procedure Laterality Date   CHOLECYSTECTOMY N/A 10/19/2019   Procedure: LAPAROSCOPIC CHOLECYSTECTOMY;  Surgeon: Berna Bue, MD;  Location: [facility];  Service: General;  Laterality: N/A;   LAPAROSCOPIC APPENDECTOMY N/A 01/17/2020   Procedure: APPENDECTOMY LAPAROSCOPIC;  Surgeon: Harriette Bouillon, MD;  Location: [facility];  Service: General;  Laterality: N/A;    FAMILY HISTORY: Family History  Problem Relation Age of Onset   Migraines Mother    Thyroid disease Mother    Cancer Maternal Grandmother    Diabetes Paternal Grandfather    Hypertension Maternal Grandfather    Diabetes Paternal Grandmother    Stroke Paternal Grandmother     SOCIAL HISTORY:  Social History   Socioeconomic History   Marital status: Significant Other    Spouse name: Not on file   Number of children: 0   Years of education: college student   Highest education level: Not on file  Occupational History   Not on file  Tobacco Use   Smoking status: Never   Smokeless tobacco: Never  Vaping Use   Vaping Use: Never used  Substance and Sexual Activity   Alcohol use: No   Drug use: No   Sexual activity: Not Currently  Other Topics Concern   Not on file  Social History Narrative   Lives at home alone   Right handed   Does not drink caffeine, makes her nauseated   Social Determinants of Health   Financial Resource Strain: Not on file  Food Insecurity: Not on file  Transportation Needs: Not on file  Physical Activity: Not on file  Stress: Not on  file  Social Connections: Not on file  Intimate Partner Violence: Not on file     PHYSICAL EXAM  Vitals:   10/19/20 0853  BP: 104/70  Pulse: 83  Weight: 117 lb (53.1 kg)  Height: 5\' 1"  (1.549 m)    Body mass index is 22.11 kg/m.  No results found.  General: The patient is well-developed and well-nourished and in no acute distress.  Mild cervical araspinal tenderness  HEENT:  Head is [place]/AT.  Sclera are anicteric.     Skin/extremities: Extremities are without rash or  edema.     Neurologic Exam  Mental status: The patient is alert and oriented x 3 at the time of the examination. The patient has apparent normal recent and remote memory, with an apparently normal attention span and concentration ability.   Speech is normal.  Cranial nerves: Extraocular movements are full. Pupils show 1+ left APD.   Reduced color vision OS.  Facial strength was normal and symmetric. No obvious hearing deficits are noted.  Motor:  Muscle bulk is normal.   Tone is normal. Strength is  5 / 5 in all 4 extremities.   Sensory: She reports reduced/altered sensation in the right arm relative to the left, mildly asymmetric in legs.    Coordination: Cerebellar testing reveals good finger-nose-finger and heel-to-shin bilaterally.  Gait and station: Station is normal.   Gait is normal. Tandem gait is normal.  Romberg is negative.  Reflexes: Deep tendon reflexes are symmetric and normal bilaterally (2 in the arms and 3 in the legs).   P    DIAGNOSTIC DATA (LABS, IMAGING, TESTING) - I reviewed patient records, labs, notes, testing and imaging myself where available.  Lab Results  Component Value Date   WBC 11.0 (H) 10/15/2020   HGB 12.8 10/15/2020   HCT 38.1 10/15/2020   MCV 88.8 10/15/2020   PLT 354 10/15/2020      Component Value Date/Time   NA 135 10/15/2020 2205   NA 140 12/28/2019 1655   K 3.8 10/15/2020 2205   CL 103 10/15/2020 2205   CO2 24 10/15/2020 2205   GLUCOSE 77 10/15/2020  2205   BUN 12 10/15/2020 2205  BUN 9 12/28/2019 1655   CREATININE 0.61 10/15/2020 2205   CALCIUM 9.6 10/15/2020 2205   PROT 6.7 10/15/2020 2205   PROT 7.5 12/28/2019 1655   ALBUMIN 3.8 10/15/2020 2205   ALBUMIN 4.9 12/28/2019 1655   ALBUMIN 4.2 07/01/2019 0919   AST 17 10/15/2020 2205   ALT 9 10/15/2020 2205   ALKPHOS 70 10/15/2020 2205   BILITOT 0.5 10/15/2020 2205   BILITOT 0.3 12/28/2019 1655   GFRNONAA >60 10/15/2020 2205   GFRAA 138 12/28/2019 1655   Lab Results  Component Value Date   CHOL 192 09/09/2020   HDL 50 09/09/2020   LDLCALC 128 (H) 09/09/2020   TRIG 68 09/09/2020   CHOLHDL 3.8 09/09/2020   Lab Results  Component Value Date   HGBA1C 5.2 09/11/2020   Lab Results  Component Value Date   VITAMINB12 553 06/17/2019   Lab Results  Component Value Date   TSH 2.000 09/09/2020       ASSESSMENT AND PLAN  Multiple sclerosis (HCC)  History of optic neuritis  Chronic migraine without aura without status migrainosus, not intractable  Numbness  1.   I do not have any great explanation for her new symptoms.  The new MRI from last week did show 1 small focus in the right subcortical frontal lobe that was not present on the previous MRI.  It is very small and would have been asymptomatic and it did not appear to be acute.  However, it would be consistent with dissemination in time and, combined with her optic neuritis and other brain lesions would be consistent with the diagnosis of multiple sclerosis.  Therefore, I recommend that she initiate a disease modifying therapy we went over a few options.  We would likely start Tecfidera.  She has recent blood work.   2.  Stay active exercise as tolerated. 3.   Continue Botox for chronic migraine 4.    Return in 6 months or sooner if there are new or worsening neurologic symptoms.  42-minute office visit with the majority of the time spent face-to-face for history and physical, discussion/counseling and  decision-making.  Additional time with record review (ED visit, personal review of imaging studies) and documentation.  Kavi Almquist A. Epimenio Foot, MD, [facility] 10/19/2020, 9:34 AM Certified in Neurology, Clinical Neurophysiology, Sleep Medicine and Neuroimaging  [facility] [address], [address] [place], [place] [address] [phone]

## 2020-10-23 ENCOUNTER — Other Ambulatory Visit: Payer: Medicaid Other

## 2020-10-24 ENCOUNTER — Telehealth: Payer: Self-pay

## 2020-10-24 NOTE — Telephone Encounter (Signed)
Tecfidera is on the preferred drug list and does not need a prior approval per [facility].

## 2020-10-24 NOTE — Telephone Encounter (Signed)
Received Tecfidera Start Form from Dr. Epimenio Foot. Faxed to [employer]. Received a receipt of confirmation.  Called [employer] to start PA since CMM was not able to submit to [facility]. Was directed to [facility] website to print off form. Completed and given to Dr. Epimenio Foot for review and signature.

## 2020-10-24 NOTE — Telephone Encounter (Signed)
Faxed PA to [facility]. Received a receipt of confirmation.

## 2020-11-03 ENCOUNTER — Encounter: Payer: Self-pay | Admitting: Family Medicine

## 2020-11-03 ENCOUNTER — Ambulatory Visit: Payer: Medicaid Other | Admitting: Family Medicine

## 2020-11-03 ENCOUNTER — Telehealth: Payer: Self-pay | Admitting: Neurology

## 2020-11-03 VITALS — BP 103/71 | HR 85 | Ht 61.0 in | Wt 116.0 lb

## 2020-11-03 DIAGNOSIS — G43709 Chronic migraine without aura, not intractable, without status migrainosus: Secondary | ICD-10-CM

## 2020-11-03 NOTE — Telephone Encounter (Signed)
PA completed on CMM/[employer] KEY:[id number]

## 2020-11-03 NOTE — Progress Notes (Signed)
11/03/2020: She continues to do well with Botox therapy. Migraines are well managed. Nurtec seems to work best for abortive therapy but not covered. She is using Ubrelvy that helps some. She was started on Tecfidera 09/2020 following concerns of 1 new focus on brain MRI. She is tolerating well and has follow up with Dr Epimenio Foot 04/2021.   08/02/2020 ALL: She continues to note benefit of Botox in migraine management. She continues Ubrelvy for abortive therapy. She continues follow up with Dr Epimenio Foot. Reports bilateral face, upper and lower extremity numbness on two separate occasions. No changes in MRI last performed 05/2020. She has follow up with Dr Epimenio Foot in 12/2020.   04/21/2020 ALL: She returns today for fourth Botox procedure. She tolerated last procedure well [name]). She does feel Botox is helping migraines. She does continue to have about 10 migraine days per month. She has failed sumatriptan and rizatriptan. She is taking gabapentin and indomethacin for back pain. Nurtec works well but not covered by [employer]. She has not tried [place]. 1 Nurtec sample and 2 Ubrelvy 50mg  samples given in office today.   She did have optic neuritis in March. MRI concerning for foci. LP performed and CSF normal. She is being followed by Dr April every 6 months with repeat imaging planned. She reports vision loss of left eye resolved. She has had some blurred/double vision bilaterally and is followed by Dr Epimenio Foot.    Consent Form Botulism Toxin Injection For Chronic Migraine    Reviewed orally with patient, additionally signature is on file:  Botulism toxin has been approved by the Federal drug administration for treatment of chronic migraine. Botulism toxin does not cure chronic migraine and it may not be effective in some patients.  The administration of botulism toxin is accomplished by injecting a small amount of toxin into the muscles of the neck and head. Dosage must be titrated for each individual. Any  benefits resulting from botulism toxin tend to wear off after 3 months with a repeat injection required if benefit is to be maintained. Injections are usually done every 3-4 months with maximum effect peak achieved by about 2 or 3 weeks. Botulism toxin is expensive and you should be sure of what costs you will incur resulting from the injection.  The side effects of botulism toxin use for chronic migraine may include:   -Transient, and usually mild, facial weakness with facial injections  -Transient, and usually mild, head or neck weakness with head/neck injections  -Reduction or loss of forehead facial animation due to forehead muscle weakness  -Eyelid drooping  -Dry eye  -Pain at the site of injection or bruising at the site of injection  -Double vision  -Potential unknown long term risks   Contraindications: You should not have Botox if you are pregnant, nursing, allergic to albumin, have an infection, skin condition, or muscle weakness at the site of the injection, or have myasthenia gravis, Lambert-Eaton syndrome, or ALS.  It is also possible that as with any injection, there may be an allergic reaction or no effect from the medication. Reduced effectiveness after repeated injections is sometimes seen and rarely infection at the injection site may occur. All care will be taken to prevent these side effects. If therapy is given over a long time, atrophy and wasting in the muscle injected may occur. Occasionally the patient's become refractory to treatment because they develop antibodies to the toxin. In this event, therapy needs to be modified.  I have read the above  information and consent to the administration of botulism toxin.    BOTOX PROCEDURE NOTE FOR MIGRAINE HEADACHE  Contraindications and precautions discussed with patient(above). Aseptic procedure was observed and patient tolerated procedure. Procedure performed by Shawnie Dapper, FNP-C.   The condition has existed for more than 6  months, and pt does not have a diagnosis of ALS, Myasthenia Gravis or Lambert-Eaton Syndrome.  Risks and benefits of injections discussed and pt agrees to proceed with the procedure.  Written consent obtained  These injections are medically necessary. Pt  receives good benefits from these injections. These injections do not cause sedations or hallucinations which the oral therapies may cause.   Description of procedure:  The patient was placed in a sitting position. The standard protocol was used for Botox as follows, with 5 units of Botox injected at each site:  -Procerus muscle, midline injection  -Corrugator muscle, bilateral injection  -Frontalis muscle, bilateral injection, with 2 sites each side, medial injection was performed in the upper one third of the frontalis muscle, in the region vertical from the medial inferior edge of the superior orbital rim. The lateral injection was again in the upper one third of the forehead vertically above the lateral limbus of the cornea, 1.5 cm lateral to the medial injection site.  -Temporalis muscle injection, 4 sites, bilaterally. The first injection was 3 cm above the tragus of the ear, second injection site was 1.5 cm to 3 cm up from the first injection site in line with the tragus of the ear. The third injection site was 1.5-3 cm forward between the first 2 injection sites. The fourth injection site was 1.5 cm posterior to the second injection site. 5th site laterally in the temporalis  muscleat the level of the outer canthus.  -Occipitalis muscle injection, 3 sites, bilaterally. The first injection was done one half way between the occipital protuberance and the tip of the mastoid process behind the ear. The second injection site was done lateral and superior to the first, 1 fingerbreadth from the first injection. The third injection site was 1 fingerbreadth superiorly and medially from the first injection site.  -Cervical paraspinal muscle  injection, 2 sites, bilaterally. The first injection site was 1 cm from the midline of the cervical spine, 3 cm inferior to the lower border of the occipital protuberance. The second injection site was 1.5 cm superiorly and laterally to the first injection site.  -Trapezius muscle injection was performed at 3 sites, bilaterally. The first injection site was in the upper trapezius muscle halfway between the inflection point of the neck, and the acromion. The second injection site was one half way between the acromion and the first injection site. The third injection was done between the first injection site and the inflection point of the neck.   Will return for repeat injection in 3 months.   A total of 200 units of Botox was prepared, 155 units of Botox was injected as documented above, any Botox not injected was wasted. The patient tolerated the procedure well, there were no complications of the above procedure.

## 2020-11-09 NOTE — Telephone Encounter (Signed)
PA denied. Submitted appeal letter to [facility] appeals at [phone]. Received fax confirmation, waiting on determination.

## 2020-11-11 ENCOUNTER — Telehealth: Payer: Medicaid Other | Admitting: Physician Assistant

## 2020-11-11 ENCOUNTER — Ambulatory Visit
Admission: RE | Admit: 2020-11-11 | Discharge: 2020-11-11 | Disposition: A | Discharge status: Home / Self Care | Payer: Medicaid Other | Source: Ambulatory Visit | Attending: Emergency Medicine | Admitting: Emergency Medicine

## 2020-11-11 ENCOUNTER — Other Ambulatory Visit: Payer: Self-pay

## 2020-11-11 VITALS — BP 114/82 | HR 96 | Temp 98.7°F | Resp 18

## 2020-11-11 DIAGNOSIS — K921 Melena: Secondary | ICD-10-CM

## 2020-11-11 DIAGNOSIS — R110 Nausea: Secondary | ICD-10-CM | POA: Diagnosis not present

## 2020-11-11 DIAGNOSIS — R197 Diarrhea, unspecified: Secondary | ICD-10-CM | POA: Diagnosis not present

## 2020-11-11 DIAGNOSIS — R1013 Epigastric pain: Secondary | ICD-10-CM

## 2020-11-11 MED ORDER — ONDANSETRON 4 MG PO TBDP: 4.0000 mg | ORAL_TABLET | Freq: Three times a day (TID) | ORAL | 0 refills | Status: DC | PRN

## 2020-11-11 NOTE — ED Triage Notes (Signed)
Patient presents to [facility] for evaluation of abdominal pain,\seems to worsen at night, intermittent x 1 week.  States she has had "uncontrollable" diarrhea, and some emesis.  States diarrhea is liquid, denies blood.  Symptoms had resolved yesterday, and overnight she woke up with pain, covered in sweat, and had a bout of diarrhea.  Was seen on televisit, and told to come here

## 2020-11-11 NOTE — Progress Notes (Signed)
Based on what you shared with me, I feel your condition warrants further evaluation and I recommend that you be seen in a face to face visit. Giving location of abdominal pain in addition to darker stool, there is some concern for gastritis and/or ulceration. You need evaluation in person for full examination, potential labs/imaging to make sure you get appropriate diagnosis and treatment. This is above the scope of what we can provide via e-visit or Virtual Urgent Care visits through MyChart.    NOTE: There will be NO CHARGE for this eVisit   If you are having a true medical emergency please call 911.      For an urgent face to face visit, Tracy Mcconnell has six urgent care centers for your convenience:     [facility] at [facility] Directions [phone] [address] [place], [place] [address]    [facility]) Get Driving Directions [phone] [address] [place], [place] [address]  [facility]) Get Driving Directions [phone] [address] [place],  [place]  [address]  [facility] at [facility] Get Driving Directions [phone] [address] [place], [place] [address]   [facility] at [facility] Get Driving Directions  [phone] [address] [place], [place] [address]   [facility] at [facility] Directions [phone] [address] [place], [place] [address]  Your MyChart E-visit questionnaire answers were reviewed by a board certified advanced clinical practitioner to complete your personal care plan based on your specific symptoms.  Thank you for using e-Visits.

## 2020-11-11 NOTE — Discharge Instructions (Addendum)
Please use Zofran dissolved in mouth to help with nausea Drink plenty of water and fluids, Pedialyte, Gatorade May use Imodium as alternative to Pepto-Bismol-1-2 tabs and monitor bowels Please follow-up if not improving or worsening

## 2020-11-12 ENCOUNTER — Encounter (HOSPITAL_COMMUNITY): Payer: Self-pay | Admitting: Emergency Medicine

## 2020-11-12 ENCOUNTER — Emergency Department (HOSPITAL_COMMUNITY)
Admission: EM | Admit: 2020-11-12 | Discharge: 2020-11-12 | Disposition: A | Discharge status: Home / Self Care | Payer: Medicaid Other | Attending: Emergency Medicine | Admitting: Emergency Medicine

## 2020-11-12 ENCOUNTER — Other Ambulatory Visit: Payer: Self-pay

## 2020-11-12 DIAGNOSIS — E876 Hypokalemia: Secondary | ICD-10-CM | POA: Diagnosis not present

## 2020-11-12 DIAGNOSIS — R1013 Epigastric pain: Secondary | ICD-10-CM | POA: Diagnosis not present

## 2020-11-12 DIAGNOSIS — N9489 Other specified conditions associated with female genital organs and menstrual cycle: Secondary | ICD-10-CM | POA: Diagnosis not present

## 2020-11-12 DIAGNOSIS — R112 Nausea with vomiting, unspecified: Secondary | ICD-10-CM

## 2020-11-12 DIAGNOSIS — K219 Gastro-esophageal reflux disease without esophagitis: Secondary | ICD-10-CM | POA: Diagnosis not present

## 2020-11-12 DIAGNOSIS — R1110 Vomiting, unspecified: Secondary | ICD-10-CM | POA: Diagnosis not present

## 2020-11-12 LAB — RAPID URINE DRUG SCREEN, HOSP PERFORMED
Amphetamines: NOT DETECTED
Barbiturates: NOT DETECTED
Benzodiazepines: NOT DETECTED
Cocaine: NOT DETECTED
Opiates: NOT DETECTED
Tetrahydrocannabinol: POSITIVE — AB

## 2020-11-12 LAB — CBC
HCT: 38 % (ref 36.0–46.0)
Hemoglobin: 13.3 g/dL (ref 12.0–15.0)
MCH: 29.8 pg (ref 26.0–34.0)
MCHC: 35 g/dL (ref 30.0–36.0)
MCV: 85 fL (ref 80.0–100.0)
Platelets: 327 10*3/uL (ref 150–400)
RBC: 4.47 MIL/uL (ref 3.87–5.11)
RDW: 12.3 % (ref 11.5–15.5)
WBC: 9.1 10*3/uL (ref 4.0–10.5)
nRBC: 0 % (ref 0.0–0.2)

## 2020-11-12 LAB — URINALYSIS, ROUTINE W REFLEX MICROSCOPIC
Bacteria, UA: NONE SEEN
Bilirubin Urine: NEGATIVE
Glucose, UA: NEGATIVE mg/dL
Ketones, ur: NEGATIVE mg/dL
Leukocytes,Ua: NEGATIVE
Nitrite: NEGATIVE
Protein, ur: NEGATIVE mg/dL
Specific Gravity, Urine: 1.002 — ABNORMAL LOW (ref 1.005–1.030)
pH: 6 (ref 5.0–8.0)

## 2020-11-12 LAB — COMPREHENSIVE METABOLIC PANEL
ALT: 17 U/L (ref 0–44)
AST: 21 U/L (ref 15–41)
Albumin: 4.4 g/dL (ref 3.5–5.0)
Alkaline Phosphatase: 69 U/L (ref 38–126)
Anion gap: 10 (ref 5–15)
BUN: 7 mg/dL (ref 6–20)
CO2: 23 mmol/L (ref 22–32)
Calcium: 10.2 mg/dL (ref 8.9–10.3)
Chloride: 102 mmol/L (ref 98–111)
Creatinine, Ser: 0.63 mg/dL (ref 0.44–1.00)
GFR, Estimated: >60 mL/min (ref >60)
Glucose, Bld: 132 mg/dL — ABNORMAL HIGH (ref 70–99)
Potassium: 2.5 mmol/L — CL (ref 3.5–5.1)
Sodium: 135 mmol/L (ref 135–145)
Total Bilirubin: 0.6 mg/dL (ref 0.3–1.2)
Total Protein: 7.3 g/dL (ref 6.5–8.1)

## 2020-11-12 LAB — LIPASE, BLOOD: Lipase: 25 U/L (ref 11–51)

## 2020-11-12 LAB — HCG, QUANTITATIVE, PREGNANCY: hCG, Beta Chain, Quant, S: <1 m[IU]/mL (ref <5)

## 2020-11-12 MED ORDER — SODIUM CHLORIDE 0.9 % IV BOLUS
1000.0000 mL | Freq: Once | INTRAVENOUS | Status: AC
  Administered 2020-11-12: 1000 mL via INTRAVENOUS

## 2020-11-12 MED ORDER — POTASSIUM CHLORIDE CRYS ER 20 MEQ PO TBCR
40.0000 meq | EXTENDED_RELEASE_TABLET | Freq: Once | ORAL | Status: AC
  Administered 2020-11-12: 40 meq via ORAL
  Filled 2020-11-12: qty 2

## 2020-11-12 MED ORDER — POTASSIUM CHLORIDE 10 MEQ/100ML IV SOLN
10.0000 meq | INTRAVENOUS | Status: AC
  Administered 2020-11-12 (×3): 10 meq via INTRAVENOUS
  Filled 2020-11-12 (×3): qty 100

## 2020-11-12 MED ORDER — SODIUM CHLORIDE 0.9 % IV SOLN
12.5000 mg | Freq: Once | INTRAVENOUS | Status: AC
  Administered 2020-11-12: 12.5 mg via INTRAVENOUS
  Filled 2020-11-12: qty 12.5

## 2020-11-12 MED ORDER — POTASSIUM CHLORIDE CRYS ER 20 MEQ PO TBCR: 20.0000 meq | EXTENDED_RELEASE_TABLET | Freq: Every day | ORAL | 0 refills | Status: DC

## 2020-11-12 MED ORDER — FAMOTIDINE IN NACL 20-0.9 MG/50ML-% IV SOLN
20.0000 mg | Freq: Once | INTRAVENOUS | Status: AC
  Administered 2020-11-12: 20 mg via INTRAVENOUS
  Filled 2020-11-12: qty 50

## 2020-11-12 NOTE — ED Provider Notes (Signed)
[facility]    CSN: [id number] Arrival date & time: 11/11/20  1222      History   Chief Complaint Chief Complaint  Patient presents with   Abdominal Pain    HPI Tracy Mcconnell is a 25 y.o. female presenting today for evaluation of abdominal pain nausea and diarrhea.  Reports symptoms began 1 to 2 days ago and continues with diarrhea nausea and poor oral intake.  Denies any close sick contacts with similar symptoms.  Denies fevers chills or body aches.  Has had prior cholecystectomy and appendectomy.  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   Severe episode of recurrent major depressive disorder, without psychotic features (HCC) 09/10/2020   Chronic migraine without aura without status migrainosus, not intractable 09/08/2020   Myalgia 09/08/2020   Acute appendicitis 01/17/2020   Abdominal pain 01/16/2020   Left lumbar radiculitis 07/24/2019   Vitamin D deficiency 07/15/2019   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   Numbness 06/25/2019   Blurry vision    Generalized anxiety disorder    Optic neuritis 06/10/2019   Hypovitaminosis D 04/07/2019   Migraines 10/28/2018   Lumbar radiculopathy 10/28/2018   Chronic daily headache 02/04/2017    Past Surgical History:  Procedure Laterality Date   CHOLECYSTECTOMY N/A 10/19/2019   Procedure: LAPAROSCOPIC CHOLECYSTECTOMY;  Surgeon: Berna Bue, MD;  Location: [facility];  Service: General;  Laterality: N/A;   LAPAROSCOPIC APPENDECTOMY N/A 01/17/2020   Procedure: APPENDECTOMY LAPAROSCOPIC;  Surgeon: Harriette Bouillon, MD;  Location: [facility];  Service:  General;  Laterality: N/A;    OB History   No obstetric history on file.      Home Medications    Prior to Admission medications   Medication Sig Start Date End Date Taking? Authorizing Provider  ondansetron (ZOFRAN ODT) 4 MG disintegrating tablet Take 1 tablet (4 mg total) by mouth every 8 (eight) hours as needed for nausea or vomiting. 11/11/20  Yes Myldred Raju C, PA-C  acetaminophen (TYLENOL) 325 MG tablet Take 2 tablets (650 mg total) by mouth every 6 (six) hours as needed for mild pain (or temp > 100). 01/18/20   Meuth, Brooke A, PA-C  buPROPion (WELLBUTRIN XL) 300 MG 24 hr tablet Take 1 tablet (300 mg total) by mouth daily. 10/14/20   Nwoko, Tommas Olp, PA  busPIRone (BUSPAR) 30 MG tablet Take 1 tablet (30 mg total) by mouth 2 (two) times daily. 10/14/20   Nwoko, Tommas Olp, PA  clonazePAM (KLONOPIN) 0.5 MG tablet Take 1 tablet (0.5 mg total) by mouth 2 (two) times daily as needed for anxiety. 10/14/20 10/14/21  Nwoko, Tommas Olp, PA  cyclobenzaprine (FLEXERIL) 5 MG tablet TAKE 1 TABLET BY MOUTH THREE TIMES A DAY AS NEEDED FOR MUSCLE SPASMS 09/13/20   Janeece Agee, NP  Dimethyl Fumarate (TECFIDERA) 120 MG CPDR Take 120 mg by mouth in the morning and at bedtime.    [provider]  FLUoxetine (PROZAC) 20 MG capsule Take 3 capsules (60 mg total) by mouth in the morning. 10/14/20   Nwoko, Tommas Olp, PA  gabapentin (NEURONTIN)  400 MG capsule Take 1 capsule (400 mg total) by mouth 4 (four) times daily -  with meals and at bedtime. 09/13/20   Laveda Abbe, NP  hydrOXYzine (ATARAX/VISTARIL) 25 MG tablet Take 1 tablet (25 mg total) by mouth 3 (three) times daily as needed for anxiety. 10/14/20   Nwoko, Tommas Olp, PA  indomethacin (INDOCIN) 25 MG capsule Take 1 capsule (25 mg total) by mouth daily as needed for moderate pain. 09/13/20   Laveda Abbe, NP  QUEtiapine (SEROQUEL) 50 MG tablet Take 1 tablet (50 mg total) by mouth at bedtime. 10/14/20   Nwoko, Tommas Olp, PA   Ubrogepant (UBRELVY) 100 MG TABS Take 100 mg by mouth daily as needed. Take one tablet at onset of headache, may repeat 1 tablet in 2 hours, no more than 2 tablets in 24 hours 04/21/20   Lomax, Amy, NP  lamoTRIgine (LAMICTAL) 25 MG tablet Take 1 tablet at night for 5 days, then 1 in the morning and 1 tablet at night for 5 days, then 1 in the morning and 2 tablets at night for 5 days. Then you can go to 2 tablets in the morning and 2 tablets in the evening thereafter 06/06/20 09/10/20  Sater, Pearletha Furl, MD  omeprazole (PRILOSEC) 20 MG capsule Take 1 capsule (20 mg total) by mouth daily. 10/11/19 09/10/20  Junie Spencer, FNP    Family History Family History  Problem Relation Age of Onset   Migraines Mother    Thyroid disease Mother    Cancer Maternal Grandmother    Diabetes Paternal Grandfather    Hypertension Maternal Grandfather    Diabetes Paternal Grandmother    Stroke Paternal Grandmother     Social History Social History   Tobacco Use   Smoking status: Never   Smokeless tobacco: Never  Vaping Use   Vaping Use: Never used  Substance Use Topics   Alcohol use: No   Drug use: No     Allergies   Sumatriptan and Other   Review of Systems Review of Systems  Constitutional:  Positive for appetite change. Negative for activity change, chills, fatigue and fever.  HENT:  Negative for congestion, ear pain, rhinorrhea, sinus pressure, sore throat and trouble swallowing.   Eyes:  Negative for discharge and redness.  Respiratory:  Negative for cough, chest tightness and shortness of breath.   Cardiovascular:  Negative for chest pain.  Gastrointestinal:  Positive for abdominal pain, diarrhea and nausea. Negative for vomiting.  Musculoskeletal:  Negative for myalgias.  Skin:  Negative for rash.  Neurological:  Negative for dizziness, light-headedness and headaches.    Physical Exam Triage Vital Signs ED Triage Vitals  Enc Vitals Group     BP 11/11/20 1254 114/82     Pulse Rate  11/11/20 1254 96     Resp 11/11/20 1254 18     Temp 11/11/20 1254 98.7 F (37.1 C)     Temp Source 11/11/20 1254 Oral     SpO2 11/11/20 1254 97 %     Weight --      Height --      Head Circumference --      Peak Flow --      Pain Score 11/11/20 1258 5     Pain Loc --      Pain Edu? --      Excl. in GC? --    No data found.  Updated Vital Signs BP 114/82 (BP Location: Right Arm)   Pulse 96  Temp 98.7 F (37.1 C) (Oral)   Resp 18   LMP 10/19/2020 (Approximate)   SpO2 97%   Visual Acuity Right Eye Distance:   Left Eye Distance:   Bilateral Distance:    Right Eye Near:   Left Eye Near:    Bilateral Near:     Physical Exam Vitals and nursing note reviewed.  Constitutional:      Appearance: She is well-developed.     Comments: No acute distress  HENT:     Head: Normocephalic and atraumatic.     Nose: Nose normal.  Eyes:     Conjunctiva/sclera: Conjunctivae normal.  Cardiovascular:     Rate and Rhythm: Normal rate.  Pulmonary:     Effort: Pulmonary effort is normal. No respiratory distress.     Breath sounds: Normal breath sounds.  Abdominal:     General: There is no distension.     Comments: Soft, nondistended, mild tenderness to periumbilical area, negative rebound, negative Rovsing, negative McBurney's, negative Murphy's  Musculoskeletal:        General: Normal range of motion.     Cervical back: Neck supple.  Skin:    General: Skin is warm and dry.  Neurological:     Mental Status: She is alert and oriented to person, place, and time.     UC Treatments / Results  Labs (all labs ordered are listed, but only abnormal results are displayed) Labs Reviewed - No data to display  EKG   Radiology No results found.  Procedures Procedures (including critical care time)  Medications Ordered in UC Medications - No data to display  Initial Impression / Assessment and Plan / UC Course  I have reviewed the triage vital signs and the nursing  notes.  Pertinent labs & imaging results that were available during my care of the patient were reviewed by me and considered in my medical decision making (see chart for details).     Treating as gastroenteritis, recommending symptomatic and supportive care with Zofran, may trial Imodium to slow diarrhea, push fluids and oral rehydration.  Low suspicion of GI bleed.  Continue to monitor for gradual resolution,Discussed strict return precautions. Patient verbalized understanding and is agreeable with plan.  Final Clinical Impressions(s) / UC Diagnoses   Final diagnoses:  Nausea without vomiting  Diarrhea, unspecified type     Discharge Instructions      Please use Zofran dissolved in mouth to help with nausea Drink plenty of water and fluids, Pedialyte, Gatorade May use Imodium as alternative to Pepto-Bismol-1-2 tabs and monitor bowels Please follow-up if not improving or worsening   ED Prescriptions     Medication Sig Dispense Auth. Provider   ondansetron (ZOFRAN ODT) 4 MG disintegrating tablet Take 1 tablet (4 mg total) by mouth every 8 (eight) hours as needed for nausea or vomiting. 20 tablet Staton Markey, Willard C, PA-C      PDMP not reviewed this encounter.   Lew Dawes, PA-C 11/12/20 1105

## 2020-11-12 NOTE — ED Provider Notes (Signed)
[facility]-EMERGENCY DEPT Provider Note   CSN: [id number] Arrival date & time: 11/12/20  1703     History Chief Complaint  Patient presents with   Abdominal Pain    Tracy Mcconnell is a 25 y.o. female hx of GERD, depression, here presenting with abdominal pain, vomiting.  Patient states that she has been having epigastric pain for the last several days.  She went to urgent care yesterday and was prescribed Zofran.  She was thought to have a gastroenteritis.  Today she ate some pizza and also smoked marijuana and afterwards had vomiting.  She states that it was gastric material as well as it was blood-tinged.  Denies any fevers.  The history is provided by the patient.      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   Severe episode of recurrent major depressive disorder, without psychotic features (HCC) 09/10/2020   Chronic migraine without aura without status migrainosus, not intractable 09/08/2020   Myalgia 09/08/2020   Acute appendicitis 01/17/2020   Abdominal pain 01/16/2020   Left lumbar radiculitis 07/24/2019   Vitamin D deficiency 07/15/2019   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   Numbness 06/25/2019   Blurry vision    Generalized anxiety disorder    Optic neuritis 06/10/2019   Hypovitaminosis D 04/07/2019   Migraines 10/28/2018   Lumbar radiculopathy 10/28/2018   Chronic daily headache 02/04/2017    Past Surgical History:  Procedure Laterality Date   CHOLECYSTECTOMY N/A 10/19/2019   Procedure: LAPAROSCOPIC CHOLECYSTECTOMY;  Surgeon: Berna Bue, MD;  Location: [facility];  Service:  General;  Laterality: N/A;   LAPAROSCOPIC APPENDECTOMY N/A 01/17/2020   Procedure: APPENDECTOMY LAPAROSCOPIC;  Surgeon: Harriette Bouillon, MD;  Location: [facility];  Service: General;  Laterality: N/A;     OB History   No obstetric history on file.     Family History  Problem Relation Age of Onset   Migraines Mother    Thyroid disease Mother    Cancer Maternal Grandmother    Diabetes Paternal Grandfather    Hypertension Maternal Grandfather    Diabetes Paternal Grandmother    Stroke Paternal Grandmother     Social History   Tobacco Use   Smoking status: Never   Smokeless tobacco: Never  Vaping Use   Vaping Use: Never used  Substance Use Topics   Alcohol use: No   Drug use: No    Home Medications Prior to Admission medications   Medication Sig Start Date End Date Taking? Authorizing Provider  acetaminophen (TYLENOL) 325 MG tablet Take 2 tablets (650 mg total) by mouth every 6 (six) hours as needed for mild pain (or temp > 100). 01/18/20   Meuth, Brooke A, PA-C  buPROPion (WELLBUTRIN XL) 300 MG 24 hr tablet Take 1 tablet (300 mg total) by mouth daily. 10/14/20   Nwoko, Tommas Olp, PA  busPIRone (BUSPAR) 30 MG tablet Take 1 tablet (30 mg total) by mouth 2 (two) times daily. 10/14/20   Nwoko, Tommas Olp, PA  clonazePAM (KLONOPIN) 0.5 MG tablet Take 1 tablet (0.5 mg total) by mouth 2 (two)  times daily as needed for anxiety. 10/14/20 10/14/21  Nwoko, Tommas Olp, PA  cyclobenzaprine (FLEXERIL) 5 MG tablet TAKE 1 TABLET BY MOUTH THREE TIMES A DAY AS NEEDED FOR MUSCLE SPASMS 09/13/20   Janeece Agee, NP  Dimethyl Fumarate (TECFIDERA) 120 MG CPDR Take 120 mg by mouth in the morning and at bedtime.    [provider]  FLUoxetine (PROZAC) 20 MG capsule Take 3 capsules (60 mg total) by mouth in the morning. 10/14/20   Nwoko, Tommas Olp, PA  gabapentin (NEURONTIN) 400 MG capsule Take 1 capsule (400 mg total) by mouth 4 (four) times daily -  with meals and at bedtime. 09/13/20   Laveda Abbe, NP  hydrOXYzine (ATARAX/VISTARIL) 25 MG tablet Take 1 tablet (25 mg total) by mouth 3 (three) times daily as needed for anxiety. 10/14/20   Nwoko, Tommas Olp, PA  indomethacin (INDOCIN) 25 MG capsule Take 1 capsule (25 mg total) by mouth daily as needed for moderate pain. 09/13/20   Laveda Abbe, NP  ondansetron (ZOFRAN ODT) 4 MG disintegrating tablet Take 1 tablet (4 mg total) by mouth every 8 (eight) hours as needed for nausea or vomiting. 11/11/20   Wieters, Hallie C, PA-C  QUEtiapine (SEROQUEL) 50 MG tablet Take 1 tablet (50 mg total) by mouth at bedtime. 10/14/20   Nwoko, Tommas Olp, PA  Ubrogepant (UBRELVY) 100 MG TABS Take 100 mg by mouth daily as needed. Take one tablet at onset of headache, may repeat 1 tablet in 2 hours, no more than 2 tablets in 24 hours 04/21/20   Lomax, Amy, NP  lamoTRIgine (LAMICTAL) 25 MG tablet Take 1 tablet at night for 5 days, then 1 in the morning and 1 tablet at night for 5 days, then 1 in the morning and 2 tablets at night for 5 days. Then you can go to 2 tablets in the morning and 2 tablets in the evening thereafter 06/06/20 09/10/20  Sater, Pearletha Furl, MD  omeprazole (PRILOSEC) 20 MG capsule Take 1 capsule (20 mg total) by mouth daily. 10/11/19 09/10/20  Junie Spencer, FNP    Allergies    Sumatriptan and Other  Review of Systems   Review of Systems  Gastrointestinal:  Positive for abdominal pain and vomiting.  All other systems reviewed and are negative.  Physical Exam Updated Vital Signs BP 110/68 (BP Location: Right Arm)   Pulse 99   Temp 98 F (36.7 C) (Oral)   Resp 16   Ht 5\' 1"  (1.549 m)   Wt 59.9 kg   LMP 10/19/2020 (Approximate)   SpO2 100%   BMI 24.94 kg/m   Physical Exam Vitals and nursing note reviewed.  HENT:     Head: Normocephalic.     Mouth/Throat:     Mouth: Mucous membranes are moist.  Eyes:     Extraocular Movements: Extraocular movements intact.  Cardiovascular:     Rate and Rhythm: Normal rate and regular  rhythm.  Abdominal:     General: Abdomen is flat.     Comments: + epigastric tenderness   Skin:    General: Skin is warm.     Capillary Refill: Capillary refill takes less than 2 seconds.  Neurological:     General: No focal deficit present.     Mental Status: She is alert and oriented to person, place, and time.  Psychiatric:        Mood and Affect: Mood normal.        Behavior: Behavior normal.  ED Results / Procedures / Treatments   Labs (all labs ordered are listed, but only abnormal results are displayed) Labs Reviewed  COMPREHENSIVE METABOLIC PANEL - Abnormal; Notable for the following components:      Result Value   Potassium 2.5 (*)    Glucose, Bld 132 (*)    All other components within normal limits  URINALYSIS, ROUTINE W REFLEX MICROSCOPIC - Abnormal; Notable for the following components:   Color, Urine STRAW (*)    Specific Gravity, Urine 1.002 (*)    Hgb urine dipstick SMALL (*)    All other components within normal limits  RAPID URINE DRUG SCREEN, HOSP PERFORMED - Abnormal; Notable for the following components:   Tetrahydrocannabinol POSITIVE (*)    All other components within normal limits  LIPASE, BLOOD  CBC  HCG, QUANTITATIVE, PREGNANCY    EKG None  Radiology No results found.  Procedures Procedures   Medications Ordered in ED Medications  sodium chloride 0.9 % bolus 1,000 mL (0 mLs Intravenous Stopped 11/12/20 1946)  promethazine (PHENERGAN) 12.5 mg in sodium chloride 0.9 % 50 mL IVPB (0 mg Intravenous Stopped 11/12/20 1946)  famotidine (PEPCID) IVPB 20 mg premix (0 mg Intravenous Stopped 11/12/20 1946)  potassium chloride 10 mEq in 100 mL IVPB (10 mEq Intravenous New Bag/Given (Non-Interop) 11/12/20 2050)  potassium chloride SA (KLOR-CON) CR tablet 40 mEq (40 mEq Oral Given 11/12/20 1829)    ED Course  I have reviewed the triage vital signs and the nursing notes.  Pertinent labs & imaging results that were available during my care of the  patient were reviewed by me and considered in my medical decision making (see chart for details).    MDM Rules/Calculators/A&P                          Tracy Mcconnell is a 25 y.o. female here with epigastric pain and blood-tinged vomit.  Patient did eat some pizza and did marijuana prior to arrival.  Will get CBC and CMP and lipase and hydrate patient.   9:50 PM Potassium is 2.5.  Patient given multiple rounds of potassium and oral potassium.  Patient able to tolerate oral in the ED.  Will prescribe potassium supplementation and recommend that patient has repeat potassium in a week.  Final Clinical Impression(s) / ED Diagnoses Final diagnoses:  None    Rx / DC Orders ED Discharge Orders     None        Charlynne Pander, MD 11/12/20 2153

## 2020-11-12 NOTE — ED Triage Notes (Signed)
Pt states that she has had abdominal pain with N/V/D x 4 days. States she was feeling some better but smoked marijuana today and vomited, seeing some blood in the vomit. States she smokes marijuana approx. 3 times a week. Alert and oriented.

## 2020-11-12 NOTE — Discharge Instructions (Addendum)
Take potassium daily   Recheck potassium in a week   Take zofran as needed as prescribed by urgent care yesterday   Return to ER if you have worse abdominal pain, vomiting.

## 2020-11-14 NOTE — Telephone Encounter (Signed)
Called [employer] to check on status of appeal. They did not receive. Have to have specific form signed by patient to give [place] permission to appeal as well as a specific appeal form we have to fill out. She will fax to [phone].

## 2020-11-15 ENCOUNTER — Encounter: Payer: Self-pay | Admitting: *Deleted

## 2020-11-21 NOTE — Telephone Encounter (Signed)
I called patient.  She has received her Tecfidera and is started taking it.  She is tolerating it well.  I reminded her of her upcoming appointments with Amy, NP and Dr. Epimenio Foot.  Patient verbalized understanding.

## 2020-12-07 ENCOUNTER — Other Ambulatory Visit: Payer: Self-pay

## 2020-12-07 ENCOUNTER — Telehealth (INDEPENDENT_AMBULATORY_CARE_PROVIDER_SITE_OTHER): Payer: Medicaid Other | Admitting: Physician Assistant

## 2020-12-07 DIAGNOSIS — F332 Major depressive disorder, recurrent severe without psychotic features: Secondary | ICD-10-CM

## 2020-12-07 DIAGNOSIS — F411 Generalized anxiety disorder: Secondary | ICD-10-CM | POA: Diagnosis not present

## 2020-12-07 DIAGNOSIS — G4700 Insomnia, unspecified: Secondary | ICD-10-CM | POA: Diagnosis not present

## 2020-12-07 MED ORDER — CLONAZEPAM 0.5 MG PO TABS: 0.5000 mg | ORAL_TABLET | Freq: Two times a day (BID) | ORAL | 0 refills | Status: DC | PRN

## 2020-12-07 MED ORDER — QUETIAPINE FUMARATE 100 MG PO TABS: 100.0000 mg | ORAL_TABLET | Freq: Every day | ORAL | 1 refills | Status: DC

## 2020-12-07 MED ORDER — FLUOXETINE HCL 40 MG PO CAPS: 80.0000 mg | ORAL_CAPSULE | Freq: Every morning | ORAL | 1 refills | Status: DC

## 2020-12-07 MED ORDER — BUSPIRONE HCL 30 MG PO TABS: 30.0000 mg | ORAL_TABLET | Freq: Two times a day (BID) | ORAL | 1 refills | Status: DC

## 2020-12-07 MED ORDER — HYDROXYZINE HCL 25 MG PO TABS: 25.0000 mg | ORAL_TABLET | Freq: Three times a day (TID) | ORAL | 1 refills | Status: DC | PRN

## 2020-12-07 MED ORDER — BUPROPION HCL ER (XL) 300 MG PO TB24: 300.0000 mg | ORAL_TABLET | Freq: Every day | ORAL | 1 refills | Status: DC

## 2020-12-08 ENCOUNTER — Encounter (HOSPITAL_COMMUNITY): Payer: Self-pay | Admitting: Physician Assistant

## 2020-12-08 NOTE — Progress Notes (Signed)
BH MD/PA/NP OP Progress Note  Virtual Visit via Telephone Note  I connected with Tracy Mcconnell on 12/08/20 at  5:00 PM EDT by telephone and verified that I am speaking with the correct person using two identifiers.  Location: Patient: Home Provider: [facility]   I discussed the limitations, risks, security and privacy concerns of performing an evaluation and management service by telephone and the availability of in person appointments. I also discussed with the patient that there may be a patient responsible charge related to this service. The patient expressed understanding and agreed to proceed.  Follow Up Instructions:  I discussed the assessment and treatment plan with the patient. The patient was provided an opportunity to ask questions and all were answered. The patient agreed with the plan and demonstrated an understanding of the instructions.   The patient was advised to call back or seek an in-person evaluation if the symptoms worsen or if the condition fails to improve as anticipated.  I provided 20 minutes of non-face-to-face time during this encounter.  Meta Hatchet, PA   12/08/2020 11:53 PM Tracy Mcconnell  MRN:  [id number]  Chief Complaint: Follow up and medication management  HPI:   Tracy Mcconnell  Visit Diagnosis:    ICD-10-CM   1. Generalized anxiety disorder  F41.1 hydrOXYzine (ATARAX/VISTARIL) 25 MG tablet    busPIRone (BUSPAR) 30 MG tablet    FLUoxetine (PROZAC) 40 MG capsule    clonazePAM (KLONOPIN) 0.5 MG tablet    2. Severe episode of recurrent major depressive disorder, without psychotic features (HCC)  F33.2 buPROPion (WELLBUTRIN XL) 300 MG 24 hr tablet    FLUoxetine (PROZAC) 40 MG capsule    3. Insomnia, unspecified type  G47.00 QUEtiapine (SEROQUEL) 100 MG tablet      Past Psychiatric History:  Generalized anxiety disorder OCD Depression Sleep disturbances  Past Medical History:  Past Medical History:   Diagnosis Date   Anemia    Anxiety    Depression    GERD (gastroesophageal reflux disease)    Hypovitaminosis D 04/07/2019   Left eye complaint 06/08/2019   Migraines     Past Surgical History:  Procedure Laterality Date   CHOLECYSTECTOMY N/A 10/19/2019   Procedure: LAPAROSCOPIC CHOLECYSTECTOMY;  Surgeon: Berna Bue, MD;  Location: [facility];  Service: General;  Laterality: N/A;   LAPAROSCOPIC APPENDECTOMY N/A 01/17/2020   Procedure: APPENDECTOMY LAPAROSCOPIC;  Surgeon: Harriette Bouillon, MD;  Location: [facility];  Service: General;  Laterality: N/A;    Family Psychiatric History:  Mother - bipolar depression, PTSD  Family History:  Family History  Problem Relation Age of Onset   Migraines Mother    Thyroid disease Mother    Cancer Maternal Grandmother    Diabetes Paternal Grandfather    Hypertension Maternal Grandfather    Diabetes Paternal Grandmother    Stroke Paternal Grandmother     Social History:  Social History   Socioeconomic History   Marital status: Significant Other    Spouse name: Not on file   Number of children: 0   Years of education: college student   Highest education level: Not on file  Occupational History   Not on file  Tobacco Use   Smoking status: Never   Smokeless tobacco: Never  Vaping Use   Vaping Use: Never used  Substance and Sexual Activity   Alcohol use: No   Drug use: No   Sexual activity: Not Currently  Other Topics Concern   Not on file  Social History  Narrative   Lives at home alone   Right handed   Does not drink caffeine, makes her nauseated   Social Determinants of Health   Financial Resource Strain: Not on file  Food Insecurity: Not on file  Transportation Needs: Not on file  Physical Activity: Not on file  Stress: Not on file  Social Connections: Not on file    Allergies:  Allergies  Allergen Reactions   Sumatriptan Shortness Of Breath and Other (See Comments)    Entire body "felt heavy"   Other Itching,  Swelling and Other (See Comments)    Pesticides (family history)- Exposure face and mouth itch and the tongue/lips swell    Metabolic Disorder Labs: Lab Results  Component Value Date   HGBA1C 5.2 09/11/2020   MPG 103 09/11/2020   MPG 100 09/09/2020   Lab Results  Component Value Date   PROLACTIN 36.5 (H) 09/09/2020   Lab Results  Component Value Date   CHOL 192 09/09/2020   TRIG 68 09/09/2020   HDL 50 09/09/2020   CHOLHDL 3.8 09/09/2020   VLDL 14 09/09/2020   LDLCALC 128 (H) 09/09/2020   Lab Results  Component Value Date   TSH 2.000 09/09/2020   TSH 0.494 12/28/2019    Therapeutic Level Labs: No results found for: LITHIUM No results found for: VALPROATE No components found for:  CBMZ  Current Medications: Current Outpatient Medications  Medication Sig Dispense Refill   acetaminophen (TYLENOL) 325 MG tablet Take 2 tablets (650 mg total) by mouth every 6 (six) hours as needed for mild pain (or temp > 100).     buPROPion (WELLBUTRIN XL) 300 MG 24 hr tablet Take 1 tablet (300 mg total) by mouth daily. 30 tablet 1   busPIRone (BUSPAR) 30 MG tablet Take 1 tablet (30 mg total) by mouth 2 (two) times daily. 60 tablet 1   clonazePAM (KLONOPIN) 0.5 MG tablet Take 1 tablet (0.5 mg total) by mouth 2 (two) times daily as needed for anxiety. 60 tablet 0   cyclobenzaprine (FLEXERIL) 5 MG tablet TAKE 1 TABLET BY MOUTH THREE TIMES A DAY AS NEEDED FOR MUSCLE SPASMS 30 tablet 1   Dimethyl Fumarate (TECFIDERA) 240 MG CPDR Take 240 mg by mouth daily.     FLUoxetine (PROZAC) 40 MG capsule Take 2 capsules (80 mg total) by mouth in the morning. 30 capsule 1   gabapentin (NEURONTIN) 400 MG capsule Take 1 capsule (400 mg total) by mouth 4 (four) times daily -  with meals and at bedtime. 120 capsule 0   hydrOXYzine (ATARAX/VISTARIL) 25 MG tablet Take 1 tablet (25 mg total) by mouth 3 (three) times daily as needed for anxiety. 30 tablet 1   indomethacin (INDOCIN) 25 MG capsule Take 1 capsule (25  mg total) by mouth daily as needed for moderate pain. 30 capsule 0   ondansetron (ZOFRAN ODT) 4 MG disintegrating tablet Take 1 tablet (4 mg total) by mouth every 8 (eight) hours as needed for nausea or vomiting. 20 tablet 0   potassium chloride SA (KLOR-CON) 20 MEQ tablet Take 1 tablet (20 mEq total) by mouth daily. 7 tablet 0   QUEtiapine (SEROQUEL) 100 MG tablet Take 1 tablet (100 mg total) by mouth at bedtime. 30 tablet 1   Ubrogepant (UBRELVY) 100 MG TABS Take 100 mg by mouth daily as needed. Take one tablet at onset of headache, may repeat 1 tablet in 2 hours, no more than 2 tablets in 24 hours 10 tablet 11   No  current facility-administered medications for this visit.     Musculoskeletal: Strength & Muscle Tone: Unable to assess due to telemedicine visit Gait & Station: Unable to assess due to telemedicine visit Patient leans: Unable to assess due to telemedicine visit  Psychiatric Specialty Exam: Review of Systems  Psychiatric/Behavioral:  Positive for sleep disturbance. Negative for decreased concentration, dysphoric mood, hallucinations, self-injury and suicidal ideas. The patient is nervous/anxious. The patient is not hyperactive.    There were no vitals taken for this visit.There is no height or weight on file to calculate BMI.  General Appearance: Unable to assess due to telemedicine visit  Eye Contact:  Unable to assess due to telemedicine visit  Speech:  Clear and Coherent and Normal Rate  Volume:  Normal  Mood:  Anxious and Euthymic  Affect:  Appropriate and Congruent  Thought Process:  Coherent, Goal Directed, and Descriptions of Associations: Intact  Orientation:  Full (Time, Place, and Person)  Thought Content: WDL   Suicidal Thoughts:  No  Homicidal Thoughts:  No  Memory:  Immediate;   Good Recent;   Good Remote;   Good  Judgement:  Good  Insight:  Good  Psychomotor Activity:  Normal  Concentration:  Concentration: Good and Attention Span: Good  Recall:  Good   Fund of Knowledge: Good  Language: Good  Akathisia:  NA  Handed:  Right  AIMS (if indicated): not done  Assets:  Communication Skills Desire for Improvement Housing Social Support  ADL's:  Intact  Cognition: WNL  Sleep:  Fair   Screenings: AUDIT    Flowsheet Row [facility]  Alcohol Use Disorder Identification Test Final Score (AUDIT) 0      GAD-7    Flowsheet Row Video Visit from 12/07/2020 in [facility] Office Visit from 10/14/2020 in [facility] Office Visit from 11/11/2019 in Primary Care at [facility] Visit from 06/17/2019 in Primary Care at [facility] Visit from 06/05/2019 in Primary Care at [facility]  Total GAD-7 Score 16 19 8  0 0      PHQ2-9    Flowsheet Row Video Visit from 12/07/2020 in [facility] Office Visit from 10/14/2020 in [facility] Office Visit from 04/11/2020 in Primary Care at [facility] Visit from 03/22/2020 in Primary Care at [facility] Visit from 12/28/2019 in Primary Care at [facility] Total Score 2 4 0 0 0  PHQ-9 Total Score 13 20 -- -- --      Flowsheet Row Video Visit from 12/07/2020 in [facility] ED from 11/12/2020 in [facility] ED from 11/11/2020 in [facility]  C-SSRS RISK CATEGORY High Risk No Risk No Risk        Assessment and Plan:     1. Generalized anxiety disorder  - hydrOXYzine (ATARAX/VISTARIL) 25 MG tablet; Take 1 tablet (25 mg total) by mouth 3 (three) times daily as needed for anxiety.  Dispense: 30 tablet; Refill: 1 - busPIRone (BUSPAR) 30 MG tablet; Take 1 tablet (30 mg total) by mouth 2 (two) times daily.  Dispense: 60 tablet; Refill: 1 - FLUoxetine (PROZAC) 40 MG capsule; Take 2 capsules (80 mg total) by mouth in the morning.  Dispense:  30 capsule; Refill: 1 - clonazePAM (KLONOPIN) 0.5 MG tablet; Take 1 tablet (0.5 mg total) by mouth 2 (two) times daily as needed for anxiety.  Dispense: 60 tablet; Refill: 0  2. Severe episode of recurrent major depressive disorder, without psychotic features (HCC)  - buPROPion (WELLBUTRIN XL) 300 MG 24 hr tablet; Take 1 tablet (300 mg total) by mouth daily.  Dispense: 30 tablet; Refill: 1 - FLUoxetine (PROZAC) 40 MG capsule; Take 2 capsules (80 mg total) by mouth in the morning.  Dispense: 30 capsule; Refill: 1  3. Insomnia, unspecified type  - QUEtiapine (SEROQUEL) 100 MG tablet; Take 1 tablet (100 mg total) by mouth at bedtime.  Dispense: 30 tablet; Refill: 1  Patient to follow up in 2 months Provider spent a total of 20 minutes with the patient/reviewing patient chart  Meta Hatchet, PA 12/08/2020, 11:53 PM

## 2020-12-10 ENCOUNTER — Encounter (HOSPITAL_COMMUNITY): Payer: Self-pay | Admitting: Physician Assistant

## 2020-12-15 ENCOUNTER — Ambulatory Visit: Payer: Medicaid Other | Admitting: Neurology

## 2020-12-19 ENCOUNTER — Other Ambulatory Visit: Payer: Self-pay | Admitting: Neurology

## 2021-01-08 ENCOUNTER — Other Ambulatory Visit: Payer: Self-pay | Admitting: Registered Nurse

## 2021-01-08 DIAGNOSIS — Z20822 Contact with and (suspected) exposure to covid-19: Secondary | ICD-10-CM | POA: Diagnosis not present

## 2021-01-08 DIAGNOSIS — F332 Major depressive disorder, recurrent severe without psychotic features: Secondary | ICD-10-CM

## 2021-01-09 NOTE — Telephone Encounter (Signed)
Patient is requesting a refill of the following medications: Requested Prescriptions   Pending Prescriptions Disp Refills   buPROPion (WELLBUTRIN XL) 300 MG 24 hr tablet [Pharmacy Med Name: BUPROPION HCL XL 300 MG TABLET] 90 tablet 1    Sig: TAKE 1 TABLET BY MOUTH EVERY DAY IN THE MORNING    Date of patient request: 01/09/2020 Last office visit: 04/11/2020 Date of last refill: 12/07/2020 Last refill amount: 30 tablets 1 refill Follow up time period per chart: n/a

## 2021-01-10 ENCOUNTER — Other Ambulatory Visit: Payer: Self-pay | Admitting: Registered Nurse

## 2021-01-10 ENCOUNTER — Other Ambulatory Visit (HOSPITAL_COMMUNITY): Payer: Self-pay | Admitting: Physician Assistant

## 2021-01-10 DIAGNOSIS — M549 Dorsalgia, unspecified: Secondary | ICD-10-CM

## 2021-01-10 DIAGNOSIS — F411 Generalized anxiety disorder: Secondary | ICD-10-CM

## 2021-01-31 ENCOUNTER — Telehealth: Payer: Self-pay | Admitting: Neurology

## 2021-01-31 DIAGNOSIS — G35 Multiple sclerosis: Secondary | ICD-10-CM

## 2021-01-31 MED ORDER — DIMETHYL FUMARATE 240 MG PO CPDR: 240.0000 mg | DELAYED_RELEASE_CAPSULE | Freq: Two times a day (BID) | ORAL | 3 refills | Status: DC

## 2021-01-31 NOTE — Telephone Encounter (Signed)
Arlys John with [facility] called stating pt has had a change in her benefits. The Dimethyl Fumarate (TECFIDERA) 240 MG CPDR does require a PA now. Arlys John also states there would be a $4 copay for the brand name and a $0 copay for the generic. Arlys John is wanting to know does the pt need the brand name or would the generic be okay to fill for her. Requesting a call back. [phone] ext [phone]

## 2021-01-31 NOTE — Telephone Encounter (Addendum)
Called pt. She is agreeable to change to generic since this is now preferred. Has never tried. Aware I will send in new rx to pharmacy and work on [place]. I will f/u w/ her if any questions arise.   I tried initiating PA on CMM. Key: [id number]. Received the following response: "Prior Authorization Not Required"  I called [facility] back. Spoke w/ Gabriel Rung. Aware we changed rx to generic. No PA needed. Transferred me to pharmacist, Tresa Endo. I also provided VO for rx. Nothing further needed.

## 2021-02-08 ENCOUNTER — Telehealth: Payer: Self-pay | Admitting: Family Medicine

## 2021-02-08 ENCOUNTER — Ambulatory Visit: Payer: Medicaid Other | Admitting: Family Medicine

## 2021-02-08 NOTE — Telephone Encounter (Signed)
Patient was on the schedule to come in today to see Amy for Botox. NP is out of office. I called patient and LVM for her to call back to r/s.

## 2021-02-09 ENCOUNTER — Telehealth (HOSPITAL_COMMUNITY): Payer: Medicaid Other | Admitting: Physician Assistant

## 2021-02-12 ENCOUNTER — Telehealth: Payer: Medicaid Other | Admitting: Physician Assistant

## 2021-02-12 DIAGNOSIS — R197 Diarrhea, unspecified: Secondary | ICD-10-CM

## 2021-02-12 MED ORDER — AZITHROMYCIN 500 MG PO TABS: 500.0000 mg | ORAL_TABLET | Freq: Every day | ORAL | 0 refills | Status: DC

## 2021-02-12 MED ORDER — ONDANSETRON HCL 4 MG PO TABS: 4.0000 mg | ORAL_TABLET | Freq: Three times a day (TID) | ORAL | 0 refills | Status: DC | PRN

## 2021-02-12 NOTE — Progress Notes (Signed)
We are sorry that you are not feeling well.  Here is how we plan to help!  Based on what you have shared with me it looks like you have Acute Infectious Diarrhea.  Most cases of acute diarrhea are due to infections with virus and bacteria and are self-limited conditions lasting less than 14 days.  For your symptoms you may take Imodium 2 mg tablets that are over the counter at your local pharmacy. Take two tablet now and then one after each loose stool up to 6 a day.  Antibiotics are not needed for most people with diarrhea.  Also, in MS, the normal signaling between the brain and colon can be disrupted, sometimes leading bowel movements at inopportune times. It may be worthwhile to also discuss this and these changes with your Neurologist as well.   Optional: Zofran 4 mg 1 tablet every 8 hours as needed for nausea and vomiting  Optional: I have prescribed azithromycin 500 mg daily for 3 days  HOME CARE We recommend changing your diet to help with your symptoms for the next few days. Drink plenty of fluids that contain water salt and sugar. Sports drinks such as Gatorade may help.  You may try broths, soups, bananas, applesauce, soft breads, mashed potatoes or crackers.  You are considered infectious for as long as the diarrhea continues. Hand washing or use of alcohol based hand sanitizers is recommend. It is best to stay out of work or school until your symptoms stop.   GET HELP RIGHT AWAY If you have dark yellow colored urine or do not pass urine frequently you should drink more fluids.   If your symptoms worsen  If you feel like you are going to pass out (faint) You have a new problem  MAKE SURE YOU  Understand these instructions. Will watch your condition. Will get help right away if you are not doing well or get worse.  Thank you for choosing an e-visit.  Your e-visit answers were reviewed by a board certified advanced clinical practitioner to complete your personal care  plan. Depending upon the condition, your plan could have included both over the counter or prescription medications.  Please review your pharmacy choice. Make sure the pharmacy is open so you can pick up prescription now. If there is a problem, you may contact your provider through [employer] and have the prescription routed to another pharmacy.  Your safety is important to [place]. If you have drug allergies check your prescription carefully.   For the next 24 hours you can use MyChart to ask questions about today's visit, request a non-urgent call back, or ask for a work or school excuse. You will get an email in the next two days asking about your experience. I hope that your e-visit has been valuable and will speed your recovery.   I provided 6 minutes of non face-to-face time during this encounter for chart review and documentation.

## 2021-02-13 NOTE — Telephone Encounter (Signed)
Called patient, she agreed to come in 11/15 at 2:30.

## 2021-02-14 ENCOUNTER — Ambulatory Visit: Payer: Medicaid Other | Admitting: Family Medicine

## 2021-02-14 VITALS — BP 104/74 | HR 78 | Ht 61.0 in | Wt 114.0 lb

## 2021-02-14 DIAGNOSIS — G43709 Chronic migraine without aura, not intractable, without status migrainosus: Secondary | ICD-10-CM | POA: Diagnosis not present

## 2021-02-14 NOTE — Progress Notes (Signed)
Botox- 100 units x 2 vial Lot: [id number] Expiration: 11/24 NDC: 0881-1031-59  0.9% Sodium Chloride- 36mL total Lot: [id number] Expiration: 04/02/22 NDC: 9244-6286-38  Dx: T77.116 B/B

## 2021-02-14 NOTE — Progress Notes (Signed)
02/14/2021 ALL: She continues to do well on Botox. [name] helps with migraine abortion. She feels migraines are better She has about 10 headache days with 3 severe headaches monthly. She continues tecfidera. She has follow up with Sater in January. She denies new or worsening symptoms.   11/03/2020: She continues to do well with Botox therapy. Migraines are well managed. Nurtec seems to work best for abortive therapy but not covered. She is using Ubrelvy that helps some. She was started on Tecfidera 09/2020 following concerns of 1 new focus on brain MRI. She is tolerating well and has follow up with Dr Epimenio Foot 04/2021.   08/02/2020 ALL: She continues to note benefit of Botox in migraine management. She continues Ubrelvy for abortive therapy. She continues follow up with Dr Epimenio Foot. Reports bilateral face, upper and lower extremity numbness on two separate occasions. No changes in MRI last performed 05/2020. She has follow up with Dr Epimenio Foot in 12/2020.   04/21/2020 ALL: She returns today for fourth Botox procedure. She tolerated last procedure well [name]). She does feel Botox is helping migraines. She does continue to have about 10 migraine days per month. She has failed sumatriptan and rizatriptan. She is taking gabapentin and indomethacin for back pain. Nurtec works well but not covered by [employer]. She has not tried [place]. 1 Nurtec sample and 2 Ubrelvy 50mg  samples given in office today.   She did have optic neuritis in March. MRI concerning for foci. LP performed and CSF normal. She is being followed by Dr April every 6 months with repeat imaging planned. She reports vision loss of left eye resolved. She has had some blurred/double vision bilaterally and is followed by Dr Epimenio Foot.    Consent Form Botulism Toxin Injection For Chronic Migraine    Reviewed orally with patient, additionally signature is on file:  Botulism toxin has been approved by the Federal drug administration for treatment of  chronic migraine. Botulism toxin does not cure chronic migraine and it may not be effective in some patients.  The administration of botulism toxin is accomplished by injecting a small amount of toxin into the muscles of the neck and head. Dosage must be titrated for each individual. Any benefits resulting from botulism toxin tend to wear off after 3 months with a repeat injection required if benefit is to be maintained. Injections are usually done every 3-4 months with maximum effect peak achieved by about 2 or 3 weeks. Botulism toxin is expensive and you should be sure of what costs you will incur resulting from the injection.  The side effects of botulism toxin use for chronic migraine may include:   -Transient, and usually mild, facial weakness with facial injections  -Transient, and usually mild, head or neck weakness with head/neck injections  -Reduction or loss of forehead facial animation due to forehead muscle weakness  -Eyelid drooping  -Dry eye  -Pain at the site of injection or bruising at the site of injection  -Double vision  -Potential unknown long term risks   Contraindications: You should not have Botox if you are pregnant, nursing, allergic to albumin, have an infection, skin condition, or muscle weakness at the site of the injection, or have myasthenia gravis, Lambert-Eaton syndrome, or ALS.  It is also possible that as with any injection, there may be an allergic reaction or no effect from the medication. Reduced effectiveness after repeated injections is sometimes seen and rarely infection at the injection site may occur. All care will be taken to  prevent these side effects. If therapy is given over a long time, atrophy and wasting in the muscle injected may occur. Occasionally the patient's become refractory to treatment because they develop antibodies to the toxin. In this event, therapy needs to be modified.  I have read the above information and consent to the  administration of botulism toxin.    BOTOX PROCEDURE NOTE FOR MIGRAINE HEADACHE  Contraindications and precautions discussed with patient(above). Aseptic procedure was observed and patient tolerated procedure. Procedure performed by Shawnie Dapper, FNP-C.   The condition has existed for more than 6 months, and pt does not have a diagnosis of ALS, Myasthenia Gravis or Lambert-Eaton Syndrome.  Risks and benefits of injections discussed and pt agrees to proceed with the procedure.  Written consent obtained  These injections are medically necessary. Pt  receives good benefits from these injections. These injections do not cause sedations or hallucinations which the oral therapies may cause.   Description of procedure:  The patient was placed in a sitting position. The standard protocol was used for Botox as follows, with 5 units of Botox injected at each site:  -Procerus muscle, midline injection  -Corrugator muscle, bilateral injection  -Frontalis muscle, bilateral injection, with 2 sites each side, medial injection was performed in the upper one third of the frontalis muscle, in the region vertical from the medial inferior edge of the superior orbital rim. The lateral injection was again in the upper one third of the forehead vertically above the lateral limbus of the cornea, 1.5 cm lateral to the medial injection site.  -Temporalis muscle injection, 4 sites, bilaterally. The first injection was 3 cm above the tragus of the ear, second injection site was 1.5 cm to 3 cm up from the first injection site in line with the tragus of the ear. The third injection site was 1.5-3 cm forward between the first 2 injection sites. The fourth injection site was 1.5 cm posterior to the second injection site. 5th site laterally in the temporalis  muscleat the level of the outer canthus.  -Occipitalis muscle injection, 3 sites, bilaterally. The first injection was done one half way between the occipital  protuberance and the tip of the mastoid process behind the ear. The second injection site was done lateral and superior to the first, 1 fingerbreadth from the first injection. The third injection site was 1 fingerbreadth superiorly and medially from the first injection site.  -Cervical paraspinal muscle injection, 2 sites, bilaterally. The first injection site was 1 cm from the midline of the cervical spine, 3 cm inferior to the lower border of the occipital protuberance. The second injection site was 1.5 cm superiorly and laterally to the first injection site.  -Trapezius muscle injection was performed at 3 sites, bilaterally. The first injection site was in the upper trapezius muscle halfway between the inflection point of the neck, and the acromion. The second injection site was one half way between the acromion and the first injection site. The third injection was done between the first injection site and the inflection point of the neck.   Will return for repeat injection in 3 months.   A total of 200 units of Botox was prepared, 155 units of Botox was injected as documented above, any Botox not injected was wasted. The patient tolerated the procedure well, there were no complications of the above procedure.

## 2021-02-19 ENCOUNTER — Other Ambulatory Visit (HOSPITAL_COMMUNITY): Payer: Self-pay | Admitting: Physician Assistant

## 2021-02-19 DIAGNOSIS — F411 Generalized anxiety disorder: Secondary | ICD-10-CM

## 2021-02-24 ENCOUNTER — Emergency Department (HOSPITAL_BASED_OUTPATIENT_CLINIC_OR_DEPARTMENT_OTHER)
Admission: EM | Admit: 2021-02-24 | Discharge: 2021-02-25 | Disposition: A | Discharge status: Home / Self Care | Payer: Medicaid Other | Attending: Emergency Medicine | Admitting: Emergency Medicine

## 2021-02-24 ENCOUNTER — Encounter (HOSPITAL_BASED_OUTPATIENT_CLINIC_OR_DEPARTMENT_OTHER): Payer: Self-pay | Admitting: Emergency Medicine

## 2021-02-24 ENCOUNTER — Emergency Department (HOSPITAL_BASED_OUTPATIENT_CLINIC_OR_DEPARTMENT_OTHER): Payer: Medicaid Other

## 2021-02-24 DIAGNOSIS — R200 Anesthesia of skin: Secondary | ICD-10-CM | POA: Diagnosis not present

## 2021-02-24 DIAGNOSIS — R202 Paresthesia of skin: Secondary | ICD-10-CM | POA: Diagnosis not present

## 2021-02-24 DIAGNOSIS — R519 Headache, unspecified: Secondary | ICD-10-CM | POA: Insufficient documentation

## 2021-02-24 DIAGNOSIS — R531 Weakness: Secondary | ICD-10-CM | POA: Diagnosis not present

## 2021-02-24 HISTORY — DX: Multiple sclerosis, unspecified: G35.D

## 2021-02-24 HISTORY — DX: Multiple sclerosis: G35

## 2021-02-24 LAB — COMPREHENSIVE METABOLIC PANEL
ALT: 12 U/L (ref 0–44)
AST: 15 U/L (ref 15–41)
Albumin: 4.6 g/dL (ref 3.5–5.0)
Alkaline Phosphatase: 64 U/L (ref 38–126)
Anion gap: 9 (ref 5–15)
BUN: 8 mg/dL (ref 6–20)
CO2: 24 mmol/L (ref 22–32)
Calcium: 9.7 mg/dL (ref 8.9–10.3)
Chloride: 104 mmol/L (ref 98–111)
Creatinine, Ser: 0.59 mg/dL (ref 0.44–1.00)
GFR, Estimated: >60 mL/min (ref >60)
Glucose, Bld: 105 mg/dL — ABNORMAL HIGH (ref 70–99)
Potassium: 3.6 mmol/L (ref 3.5–5.1)
Sodium: 137 mmol/L (ref 135–145)
Total Bilirubin: 0.4 mg/dL (ref 0.3–1.2)
Total Protein: 7 g/dL (ref 6.5–8.1)

## 2021-02-24 LAB — CBC WITH DIFFERENTIAL/PLATELET
Abs Immature Granulocytes: 0.03 10*3/uL (ref 0.00–0.07)
Basophils Absolute: 0 10*3/uL (ref 0.0–0.1)
Basophils Relative: 1 %
Eosinophils Absolute: 0.2 10*3/uL (ref 0.0–0.5)
Eosinophils Relative: 2 %
HCT: 38.1 % (ref 36.0–46.0)
Hemoglobin: 13.1 g/dL (ref 12.0–15.0)
Immature Granulocytes: 0 %
Lymphocytes Relative: 27 %
Lymphs Abs: 2.3 10*3/uL (ref 0.7–4.0)
MCH: 29.7 pg (ref 26.0–34.0)
MCHC: 34.4 g/dL (ref 30.0–36.0)
MCV: 86.4 fL (ref 80.0–100.0)
Monocytes Absolute: 0.6 10*3/uL (ref 0.1–1.0)
Monocytes Relative: 7 %
Neutro Abs: 5.5 10*3/uL (ref 1.7–7.7)
Neutrophils Relative %: 63 %
Platelets: 332 10*3/uL (ref 150–400)
RBC: 4.41 MIL/uL (ref 3.87–5.11)
RDW: 11.7 % (ref 11.5–15.5)
WBC: 8.7 10*3/uL (ref 4.0–10.5)
nRBC: 0 % (ref 0.0–0.2)

## 2021-02-24 NOTE — ED Provider Notes (Signed)
[facility] Provider Note: Tracy Dell, MD, FACEP  CSN: [id number] MRN: [id number] ARRIVAL: 02/24/21 at 2208 ROOM: DB013/DB013   CHIEF COMPLAINT  Left Side Weakness   HISTORY OF PRESENT ILLNESS  02/24/21 11:44 PM Tracy Mcconnell is a 25 y.o. female with history of complex migraines and multiple sclerosis.  She is here with the onset of a left-sided headache along with left-sided numbness (near insensate on the left side but still able to feel temperature and gross touch) and mild left-sided weakness.  The headache was initially throbbing but now is dull.  She rates it as a 7 out of 10.  She describes it as being different than her usual migraines.  Prior to my evaluation, Dr. Wilkie Aye (EDP) discussed the patient's care with Dr. Derry Lory, the neurohospitalist on-call.  He did not want the patient paged out as a code stroke.  He did request a CT scan at that the patient then be transferred to [facility] for emergent MRI.   Past Medical History:  Diagnosis Date   Anemia    Anxiety    Depression    GERD (gastroesophageal reflux disease)    Hypovitaminosis D 04/07/2019   Left eye complaint 06/08/2019   Migraines    MS (multiple sclerosis) (HCC)     Past Surgical History:  Procedure Laterality Date   CHOLECYSTECTOMY N/A 10/19/2019   Procedure: LAPAROSCOPIC CHOLECYSTECTOMY;  Surgeon: Berna Bue, MD;  Location: [facility];  Service: General;  Laterality: N/A;   LAPAROSCOPIC APPENDECTOMY N/A 01/17/2020   Procedure: APPENDECTOMY LAPAROSCOPIC;  Surgeon: Harriette Bouillon, MD;  Location: [facility];  Service: General;  Laterality: N/A;    Family History  Problem Relation Age of Onset   Migraines Mother    Thyroid disease Mother    Cancer Maternal Grandmother    Diabetes Paternal Grandfather    Hypertension Maternal Grandfather    Diabetes Paternal Grandmother    Stroke Paternal Grandmother     Social History   Tobacco Use   Smoking status: Never   Smokeless  tobacco: Never  Vaping Use   Vaping Use: Never used  Substance Use Topics   Alcohol use: Not Currently   Drug use: No    Prior to Admission medications   Medication Sig Start Date End Date Taking? Authorizing Provider  acetaminophen (TYLENOL) 325 MG tablet Take 2 tablets (650 mg total) by mouth every 6 (six) hours as needed for mild pain (or temp > 100). 01/18/20   Meuth, Brooke A, PA-C  azithromycin (ZITHROMAX) 500 MG tablet Take 1 tablet (500 mg total) by mouth daily. 02/12/21   Margaretann Loveless, PA-C  buPROPion (WELLBUTRIN XL) 300 MG 24 hr tablet TAKE 1 TABLET BY MOUTH EVERY DAY IN THE MORNING 01/09/21   Janeece Agee, NP  busPIRone (BUSPAR) 30 MG tablet TAKE 1 TABLET BY MOUTH 2 TIMES DAILY. 02/22/21   Nwoko, Stephens Shire E, PA  clonazePAM (KLONOPIN) 0.5 MG tablet TAKE 1 TABLET BY MOUTH 2 TIMES DAILY AS NEEDED FOR ANXIETY. 01/12/21   Nwoko, Tommas Olp, PA  cyclobenzaprine (FLEXERIL) 5 MG tablet TAKE 1 TABLET BY MOUTH THREE TIMES A DAY AS NEEDED FOR MUSCLE SPASMS 01/11/21   Janeece Agee, NP  Dimethyl Fumarate (TECFIDERA) 240 MG CPDR Take 1 capsule (240 mg total) by mouth in the morning and at bedtime. 01/31/21   Sater, Pearletha Furl, MD  FLUoxetine (PROZAC) 40 MG capsule Take 2 capsules (80 mg total) by mouth in the morning. 12/07/20   Meta Hatchet,  PA  gabapentin (NEURONTIN) 400 MG capsule Take 1 capsule (400 mg total) by mouth 4 (four) times daily -  with meals and at bedtime. 09/13/20   Laveda Abbe, NP  hydrOXYzine (ATARAX/VISTARIL) 25 MG tablet Take 1 tablet (25 mg total) by mouth 3 (three) times daily as needed for anxiety. 12/07/20   Nwoko, Tommas Olp, PA  indomethacin (INDOCIN) 25 MG capsule TAKE 1 CAPSULE (25 MG TOTAL) BY MOUTH DAILY AS NEEDED. 12/20/20   Sater, Pearletha Furl, MD  ondansetron (ZOFRAN ODT) 4 MG disintegrating tablet Take 1 tablet (4 mg total) by mouth every 8 (eight) hours as needed for nausea or vomiting. 11/11/20   Wieters, Hallie C, PA-C  ondansetron (ZOFRAN) 4 MG  tablet Take 1 tablet (4 mg total) by mouth every 8 (eight) hours as needed for nausea or vomiting. 02/12/21   Margaretann Loveless, PA-C  potassium chloride SA (KLOR-CON) 20 MEQ tablet Take 1 tablet (20 mEq total) by mouth daily. 11/12/20   Charlynne Pander, MD  QUEtiapine (SEROQUEL) 100 MG tablet Take 1 tablet (100 mg total) by mouth at bedtime. 12/07/20   Nwoko, Tommas Olp, PA  Ubrogepant (UBRELVY) 100 MG TABS Take 100 mg by mouth daily as needed. Take one tablet at onset of headache, may repeat 1 tablet in 2 hours, no more than 2 tablets in 24 hours 04/21/20   Lomax, Amy, NP  lamoTRIgine (LAMICTAL) 25 MG tablet Take 1 tablet at night for 5 days, then 1 in the morning and 1 tablet at night for 5 days, then 1 in the morning and 2 tablets at night for 5 days. Then you can go to 2 tablets in the morning and 2 tablets in the evening thereafter 06/06/20 09/10/20  Sater, Pearletha Furl, MD  omeprazole (PRILOSEC) 20 MG capsule Take 1 capsule (20 mg total) by mouth daily. 10/11/19 09/10/20  Junie Spencer, FNP    Allergies Sumatriptan and Other   REVIEW OF SYSTEMS  Negative except as noted here or in the History of Present Illness.   PHYSICAL EXAMINATION  Initial Vital Signs Blood pressure (!) 116/91, pulse 87, temperature 98.4 F (36.9 C), temperature source Oral, resp. rate 16, height 5\' 1"  (1.549 m), weight 52.2 kg, last menstrual period 02/10/2021, SpO2 97 %.  Examination General: Well-developed, well-nourished female in no acute distress; appearance consistent with age of record HENT: normocephalic; atraumatic Eyes: pupils equal, round and reactive to light; extraocular muscles intact Neck: supple Heart: regular rate and rhythm Lungs: clear to auscultation bilaterally Abdomen: soft; nondistended; nontender; bowel sounds present Extremities: No deformity; full range of motion; pulses normal Neurologic: Awake, alert and oriented; motor function plus 5 out of 5 and symmetric in upper extremities, +4-5  on the left lower extremity and plus 5 out of 5 on the right lower extremity; near and sensitivity of the left side with preservation of temperature sensation; no facial droop Skin: Warm and dry Psychiatric: Normal mood and affect   RESULTS  Summary of this visit's results, reviewed and interpreted by myself:   EKG Interpretation  Date/Time:    Ventricular Rate:    PR Interval:    QRS Duration:   QT Interval:    QTC Calculation:   R Axis:     Text Interpretation:         Laboratory Studies: Results for orders placed or performed during the hospital encounter of 02/24/21 (from the past 24 hour(s))  CBC with Differential     Status: None  Collection Time: 02/24/21 11:19 PM  Result Value Ref Range   WBC 8.7 4.0 - 10.5 K/uL   RBC 4.41 3.87 - 5.11 MIL/uL   Hemoglobin 13.1 12.0 - 15.0 g/dL   HCT 08.6 76.1 - 95.0 %   MCV 86.4 80.0 - 100.0 fL   MCH 29.7 26.0 - 34.0 pg   MCHC 34.4 30.0 - 36.0 g/dL   RDW 93.2 67.1 - 24.5 %   Platelets 332 150 - 400 K/uL   nRBC 0.0 0.0 - 0.2 %   Neutrophils Relative % 63 %   Neutro Abs 5.5 1.7 - 7.7 K/uL   Lymphocytes Relative 27 %   Lymphs Abs 2.3 0.7 - 4.0 K/uL   Monocytes Relative 7 %   Monocytes Absolute 0.6 0.1 - 1.0 K/uL   Eosinophils Relative 2 %   Eosinophils Absolute 0.2 0.0 - 0.5 K/uL   Basophils Relative 1 %   Basophils Absolute 0.0 0.0 - 0.1 K/uL   Immature Granulocytes 0 %   Abs Immature Granulocytes 0.03 0.00 - 0.07 K/uL  Comprehensive metabolic panel     Status: Abnormal   Collection Time: 02/24/21 11:19 PM  Result Value Ref Range   Sodium 137 135 - 145 mmol/L   Potassium 3.6 3.5 - 5.1 mmol/L   Chloride 104 98 - 111 mmol/L   CO2 24 22 - 32 mmol/L   Glucose, Bld 105 (H) 70 - 99 mg/dL   BUN 8 6 - 20 mg/dL   Creatinine, Ser 8.09 0.44 - 1.00 mg/dL   Calcium 9.7 8.9 - 98.3 mg/dL   Total Protein 7.0 6.5 - 8.1 g/dL   Albumin 4.6 3.5 - 5.0 g/dL   AST 15 15 - 41 U/L   ALT 12 0 - 44 U/L   Alkaline Phosphatase 64 38 - 126 U/L    Total Bilirubin 0.4 0.3 - 1.2 mg/dL   GFR, Estimated >38 >25 mL/min   Anion gap 9 5 - 15   Imaging Studies: CT Head Wo Contrast  Result Date: 02/24/2021 CLINICAL DATA:  Neurologic deficit. EXAM: CT HEAD WITHOUT CONTRAST TECHNIQUE: Contiguous axial images were obtained from the base of the skull through the vertex without intravenous contrast. COMPARISON:  Head CT dated 12/21/2017. FINDINGS: Brain: The ventricles and sulci are appropriate size for the patient's age. The gray-white matter discrimination is preserved. There is no acute intracranial hemorrhage. No mass effect or midline shift. No extra-axial fluid collection. Vascular: No hyperdense vessel or unexpected calcification. Skull: Normal. Negative for fracture or focal lesion. Sinuses/Orbits: No acute finding. Other: None IMPRESSION: Unremarkable noncontrast CT of the brain. Electronically Signed   By: Elgie Collard M.D.   On: 02/24/2021 23:03    ED COURSE and MDM  Nursing notes, initial and subsequent vitals signs, including pulse oximetry, reviewed and interpreted by myself.  Vitals:   02/24/21 2214 02/24/21 2215 02/24/21 2330  BP:  129/89 (!) 116/91  Pulse:  97 87  Resp:  18 16  Temp:  98.4 F (36.9 C)   TempSrc:  Oral   SpO2:  100% 97%  Weight: 52.2 kg    Height: 5\' 1"  (1.549 m)     Medications - No data to display  12:11 AM Will transfer to [facility] for emergent MRI.  [facility] is the accepting EDP.  1:56 AM [facility] at bedside to transport patient  PROCEDURES  Procedures   ED DIAGNOSES     ICD-10-CM   1. Acute left-sided weakness  R53.1  2. Numbness on left side  R20.0     3. Left-sided headache  R51.9          Vernia Teem, Jonny Ruiz, MD 02/25/21 0157

## 2021-02-24 NOTE — ED Triage Notes (Signed)
Pt arrives pov with c/o Left side facial weakness and left side and posterior HA that started at 2100. Bilaterally equal, speech clear. Hx of migraines, recent dx with MS.

## 2021-02-24 NOTE — ED Notes (Signed)
Neuro paged @ [phone] per Dr. Wilkie Aye

## 2021-02-25 ENCOUNTER — Emergency Department (HOSPITAL_COMMUNITY): Payer: Medicaid Other

## 2021-02-25 DIAGNOSIS — R531 Weakness: Secondary | ICD-10-CM | POA: Diagnosis present

## 2021-02-25 DIAGNOSIS — R200 Anesthesia of skin: Secondary | ICD-10-CM | POA: Diagnosis not present

## 2021-02-25 DIAGNOSIS — R519 Headache, unspecified: Secondary | ICD-10-CM | POA: Diagnosis not present

## 2021-02-25 LAB — PREGNANCY, URINE: Preg Test, Ur: NEGATIVE

## 2021-02-25 MED ORDER — GADOBUTROL 1 MMOL/ML IV SOLN
5.0000 mL | Freq: Once | INTRAVENOUS | Status: AC | PRN
  Administered 2021-02-25: 5 mL via INTRAVENOUS

## 2021-02-25 MED ORDER — DIPHENHYDRAMINE HCL 50 MG/ML IJ SOLN
25.0000 mg | Freq: Once | INTRAMUSCULAR | Status: AC
  Administered 2021-02-25: 25 mg via INTRAVENOUS
  Filled 2021-02-25: qty 1

## 2021-02-25 MED ORDER — ACETAMINOPHEN 500 MG PO TABS
1000.0000 mg | ORAL_TABLET | Freq: Once | ORAL | Status: AC
  Administered 2021-02-25: 1000 mg via ORAL
  Filled 2021-02-25: qty 2

## 2021-02-25 MED ORDER — PROCHLORPERAZINE EDISYLATE 10 MG/2ML IJ SOLN
10.0000 mg | Freq: Once | INTRAMUSCULAR | Status: AC
  Administered 2021-02-25: 10 mg via INTRAVENOUS
  Filled 2021-02-25: qty 2

## 2021-02-25 MED ORDER — SODIUM CHLORIDE 0.9 % IV BOLUS
1000.0000 mL | Freq: Once | INTRAVENOUS | Status: AC
  Administered 2021-02-25: 1000 mL via INTRAVENOUS

## 2021-02-25 NOTE — ED Notes (Signed)
The pt arrived by [facility] [place] [facility] ms for a few months  also migraine headaches.  No numbness in her lt arm and leg a and o x 4   lmp 2 weeks ago

## 2021-02-25 NOTE — ED Notes (Signed)
The pt returned from mri  headache is worse now

## 2021-02-25 NOTE — Discharge Instructions (Addendum)
You were evaluated in the Emergency Department and after careful evaluation, we did not find any emergent condition requiring admission or further testing in the hospital.  Your exam/testing today was overall reassuring.  Follow-up with Dr. Epimenio Foot to discuss your symptoms.  Please return to the Emergency Department if you experience any worsening of your condition.  Thank you for allowing [place] to be a part of your care.

## 2021-02-25 NOTE — ED Notes (Signed)
Patient remains in MRI 

## 2021-02-25 NOTE — ED Provider Notes (Signed)
  Provider Note MRN:  [id number]  Arrival date & time: 02/25/21    ED Course and Medical Decision Making  Assumed care from Dr. Read Drivers upon patient transfer.  Left-sided numbness, weakness, headache, history of MS and complex migraine.  Transferred for MRI.  MRI normal, patient feeling better after migraine cocktail.  Discussed with neurology, appropriate for discharge.  Procedures  Final Clinical Impressions(s) / ED Diagnoses     ICD-10-CM   1. Acute left-sided weakness  R53.1     2. Numbness on left side  R20.0     3. Left-sided headache  R51.9       ED Discharge Orders     None       Discharge Instructions   None     Elmer Sow. Pilar Plate, MD [facility] Emergency Medicine [facility] [email or web address]    Sabas Sous, MD 02/25/21 [phone]

## 2021-02-25 NOTE — ED Notes (Signed)
No numbness or tingling in her lt arm and leg

## 2021-02-27 ENCOUNTER — Other Ambulatory Visit: Payer: Self-pay | Admitting: Neurology

## 2021-02-27 ENCOUNTER — Telehealth: Payer: Self-pay

## 2021-02-27 DIAGNOSIS — G35 Multiple sclerosis: Secondary | ICD-10-CM

## 2021-02-27 DIAGNOSIS — H9313 Tinnitus, bilateral: Secondary | ICD-10-CM

## 2021-02-27 DIAGNOSIS — Z8669 Personal history of other diseases of the nervous system and sense organs: Secondary | ICD-10-CM

## 2021-02-27 NOTE — Telephone Encounter (Signed)
Transition Care Management Follow-up Telephone Call Date of discharge and from where: 02/25/2021 from [facility] How have you been since you were released from the hospital? Pt stated that she is feeling well and did not have any questions or concerns at this time.  Any questions or concerns? No  Items Reviewed: Did the pt receive and understand the discharge instructions provided? Yes  Medications obtained and verified? Yes  Other? No  Any new allergies since your discharge? No  Dietary orders reviewed? No Do you have support at home? Yes   Functional Questionnaire: (I = Independent and D = Dependent) ADLs: I  Bathing/Dressing- I  Meal Prep- I  Eating- I  Maintaining continence- I  Transferring/Ambulation- I  Managing Meds- I   Follow up appointments reviewed:  PCP Hospital f/u appt confirmed? No   Specialist Hospital f/u appt confirmed? No   Are transportation arrangements needed? No  If their condition worsens, is the pt aware to call PCP or go to the Emergency Dept.? Yes Was the patient provided with contact information for the PCP's office or ED? Yes Was to pt encouraged to call back with questions or concerns? Yes

## 2021-03-14 ENCOUNTER — Other Ambulatory Visit (HOSPITAL_COMMUNITY): Payer: Self-pay | Admitting: Physician Assistant

## 2021-03-14 DIAGNOSIS — F411 Generalized anxiety disorder: Secondary | ICD-10-CM

## 2021-03-14 DIAGNOSIS — F332 Major depressive disorder, recurrent severe without psychotic features: Secondary | ICD-10-CM

## 2021-03-19 ENCOUNTER — Telehealth: Payer: Medicaid Other | Admitting: Physician Assistant

## 2021-03-19 DIAGNOSIS — J019 Acute sinusitis, unspecified: Secondary | ICD-10-CM

## 2021-03-19 MED ORDER — AMOXICILLIN-POT CLAVULANATE 875-125 MG PO TABS: 1.0000 | ORAL_TABLET | Freq: Two times a day (BID) | ORAL | 0 refills | Status: DC

## 2021-03-19 NOTE — Progress Notes (Signed)
E-Visit for Sinus Problems  We are sorry that you are not feeling well.  Here is how we plan to help!  Based on what you have shared with me it looks like you have sinusitis.  Sinusitis is inflammation and infection in the sinus cavities of the head.  Based on your presentation I believe you most likely have Acute Bacterial Sinusitis.  This is an infection caused by bacteria and is treated with antibiotics. I have prescribed Augmentin 875mg /125mg  one tablet twice daily with food, for 7 days. You may use an oral decongestant such as Mucinex D or if you have glaucoma or high blood pressure use plain Mucinex. Saline nasal spray help and can safely be used as often as needed for congestion.  If you develop worsening sinus pain, fever or notice severe headache and vision changes, or if symptoms are not better after completion of antibiotic, please schedule an appointment with a health care provider.    Sinus infections are not as easily transmitted as other respiratory infection, however we still recommend that you avoid close contact with loved ones, especially the very young and elderly.  Remember to wash your hands thoroughly throughout the day as this is the number one way to prevent the spread of infection!  Home Care: Only take medications as instructed by your medical team. Complete the entire course of an antibiotic. Do not take these medications with alcohol. A steam or ultrasonic humidifier can help congestion.  You can place a towel over your head and breathe in the steam from hot water coming from a faucet. Avoid close contacts especially the very young and the elderly. Cover your mouth when you cough or sneeze. Always remember to wash your hands.  Get Help Right Away If: You develop worsening fever or sinus pain. You develop a severe head ache or visual changes. Your symptoms persist after you have completed your treatment plan.  Make sure you Understand these instructions. Will watch  your condition. Will get help right away if you are not doing well or get worse.  Thank you for choosing an e-visit.  Your e-visit answers were reviewed by a board certified advanced clinical practitioner to complete your personal care plan. Depending upon the condition, your plan could have included both over the counter or prescription medications.  Please review your pharmacy choice. Make sure the pharmacy is open so you can pick up prescription now. If there is a problem, you may contact your provider through and have the prescription routed to another pharmacy.  Your safety is important to [employer]. If you have drug allergies check your prescription carefully.   For the next 24 hours you can use MyChart to ask questions about today's visit, request a non-urgent call back, or ask for a work or school excuse. You will get an email in the next two days asking about your experience. I hope that your e-visit has been valuable and will speed your recovery.  Greater than 5 minutes, yet less than 10 minutes of time have been spent researching, coordinating, and implementing care for this patient today

## 2021-03-31 ENCOUNTER — Encounter (HOSPITAL_COMMUNITY): Payer: Self-pay

## 2021-03-31 ENCOUNTER — Telehealth (HOSPITAL_COMMUNITY): Payer: Medicaid Other | Admitting: Physician Assistant

## 2021-04-07 ENCOUNTER — Telehealth: Payer: Medicaid Other | Admitting: Emergency Medicine

## 2021-04-07 DIAGNOSIS — R197 Diarrhea, unspecified: Secondary | ICD-10-CM

## 2021-04-07 NOTE — Progress Notes (Signed)
Based on what you shared with me, I feel your condition warrants further evaluation and I recommend that you be seen in a face to face visit.  This could be a simple case of infectious diarrhea, but with the blood in the stools and the burning sensation you mention, you need to have an abdominal exam.  This needs to be done in person to help rule out other more serious causes of your symptoms.   NOTE: There will be NO CHARGE for this eVisit   If you are having a true medical emergency please call 911.      For an urgent face to face visit, [facility] has six urgent care centers for your convenience:     [facility] at [facility] Directions [phone] [address] [place], [place] [address]    [facility]) Get Driving Directions [phone] [address] [place], [place] [address]  [facility]) Get Driving Directions [phone] [address] [place],  [place]  [address]  [facility] at [facility] Get Driving Directions [phone] [address] [place], [place] [address]   [facility] at [facility] Get Driving Directions  [phone] [address] [place], [place] [address]   [facility] at [facility] Directions [phone] [address] [place], [place] [address]  Your MyChart E-visit questionnaire answers were reviewed by a board certified advanced clinical practitioner to complete your personal care plan based on your specific symptoms.  Thank you for using e-Visits.   Approximately 5 minutes was used in reviewing the patient's chart, questionnaire, prescribing medications, and documentation.

## 2021-04-10 ENCOUNTER — Other Ambulatory Visit (HOSPITAL_COMMUNITY): Payer: Self-pay | Admitting: Physician Assistant

## 2021-04-10 DIAGNOSIS — F411 Generalized anxiety disorder: Secondary | ICD-10-CM

## 2021-04-10 DIAGNOSIS — G4700 Insomnia, unspecified: Secondary | ICD-10-CM

## 2021-04-15 NOTE — Telephone Encounter (Signed)
Provider unable to provide refills due to patient missing last appointment. Patient needs to call facility to reschedule follow up appointment to continue receiving refills

## 2021-04-19 ENCOUNTER — Ambulatory Visit: Payer: Medicaid Other | Admitting: Neurology

## 2021-04-19 ENCOUNTER — Encounter: Payer: Self-pay | Admitting: Neurology

## 2021-05-05 ENCOUNTER — Telehealth: Payer: Medicaid Other | Admitting: Nurse Practitioner

## 2021-05-05 DIAGNOSIS — K0889 Other specified disorders of teeth and supporting structures: Secondary | ICD-10-CM

## 2021-05-06 MED ORDER — NAPROXEN 500 MG PO TABS: 500.0000 mg | ORAL_TABLET | Freq: Two times a day (BID) | ORAL | 1 refills | Status: DC

## 2021-05-06 NOTE — Progress Notes (Signed)
E-Visit for Dental Pain ° °We are sorry that you are not feeling well.  Here is how we plan to help! ° °Based on what you have shared with me in the questionnaire, it sounds like you have dental pain ° °naprosyn 500mg 2 times per day for 7 days for discomfort ° °It is imperative that you see a dentist within 10 days of this eVisit to determine the cause of the dental pain and be sure it is adequately treated ° °A toothache or tooth pain is caused when the nerve in the root of a tooth or surrounding a tooth is irritated. Dental (tooth) infection, decay, injury, or loss of a tooth are the most common causes of dental pain. Pain may also occur after an extraction (tooth is pulled out). Pain sometimes originates from other areas and radiates to the jaw, thus appearing to be tooth pain.Bacteria growing inside your mouth can contribute to gum disease and dental decay, both of which can cause pain. A toothache occurs from inflammation of the central portion of the tooth called pulp. The pulp contains nerve endings that are very sensitive to pain. Inflammation to the pulp or pulpitis may be caused by dental cavities, trauma, and infection.  ° ° °HOME CARE:  ° °For toothaches: °Over-the-counter pain medications such as acetaminophen or ibuprofen may be used. Take these as directed on the package while you arrange for a dental appointment. °Avoid very cold or hot foods, because they may make the pain worse. °You may get relief from biting on a cotton ball soaked in oil of cloves. You can get oil of cloves at most drug stores. ° °For jaw pain: ° Aspirin may be helpful for problems in the joint of the jaw in adults. °If pain happens every time you open your mouth widely, the temporomandibular joint (TMJ) may be the source of the pain. Yawning or taking a large bite of food may worsen the pain. An appointment with your doctor or dentist will help you find the cause. °  ° ° °GET HELP RIGHT AWAY IF: ° °You have a high fever or  chills °If you have had a recent head or face injury and develop headache, light headedness, nausea, vomiting, or other symptoms that concern you after an injury to your face or mouth, you could have a more serious injury in addition to your dental injury. °A facial rash associated with a toothache: This condition may improve with medication. Contact your doctor for them to decide what is appropriate. °Any jaw pain occurring with chest pain: Although jaw pain is most commonly caused by dental disease, it is sometimes referred pain from other areas. People with heart disease, especially people who have had stents placed, people with diabetes, or those who have had heart surgery may have jaw pain as a symptom of heart attack or angina. If your jaw or tooth pain is associated with lightheadedness, sweating, or shortness of breath, you should see a doctor as soon as possible. °Trouble swallowing or excessive pain or bleeding from gums: If you have a history of a weakened immune system, diabetes, or steroid use, you may be more susceptible to infections. Infections can often be more severe and extensive or caused by unusual organisms. Dental and gum infections in people with these conditions may require more aggressive treatment. An abscess may need draining or IV antibiotics, for example. ° °MAKE SURE YOU  ° °Understand these instructions. °Will watch your condition. °Will get help right away if   you are not doing well or get worse. ° °Thank you for choosing an e-visit. ° °Your e-visit answers were reviewed by a board certified advanced clinical practitioner to complete your personal care plan. Depending upon the condition, your plan could have included both over the counter or prescription medications. ° °Please review your pharmacy choice. Make sure the pharmacy is open so you can pick up prescription now. If there is a problem, you may contact your provider through MyChart messaging and have the prescription routed to  another pharmacy.  Your safety is important to us. If you have drug allergies check your prescription carefully.  ° °For the next 24 hours you can use MyChart to ask questions about today's visit, request a non-urgent call back, or ask for a work or school excuse. °You will get an email in the next two days asking about your experience. I hope that your e-visit has been valuable and will speed your recovery. °5-10 minutes spent reviewing and documenting in chart. ° °

## 2021-05-08 NOTE — Progress Notes (Signed)
05/09/2021 ALL: She returns for Botox. She feels migraines are well managed. She may have 7-8 milder headaches per month but onlu 1-2 migraines. Tracy Mcconnell helps with abortive therapy. No new MS symptoms. She continues Tecfidera. She missed her follow up with Dr [name] 04/19/21.   02/14/2021 ALL: She continues to do well on Botox. Tracy Mcconnell helps with migraine abortion. She feels migraines are better She has about 10 headache days with 3 severe headaches monthly. She continues tecfidera. She has follow up with [facility] in January. She denies new or worsening symptoms.   11/03/2020: She continues to do well with Botox therapy. Migraines are well managed. Nurtec seems to work best for abortive therapy but not covered. She is using Ubrelvy that helps some. She was started on Tecfidera 09/2020 following concerns of 1 new focus on brain MRI. She is tolerating well and has follow up with Dr [name] 04/2021.   08/02/2020 ALL: She continues to note benefit of Botox in migraine management. She continues Ubrelvy for abortive therapy. She continues follow up with Dr [name]. Reports bilateral face, upper and lower extremity numbness on two separate occasions. No changes in MRI last performed 05/2020. She has follow up with Dr [name] in 12/2020.   04/21/2020 ALL: She returns today for fourth Botox procedure. She tolerated last procedure well Ward [name]). She does feel Botox is helping migraines. She does continue to have about 10 migraine days per month. She has failed sumatriptan and rizatriptan. She is taking gabapentin and indomethacin for back pain. Nurtec works well but not covered by [employer]. She has not tried [place]. 1 Nurtec sample and 2 Ubrelvy 50mg  samples given in office today.   She did have optic neuritis in March. MRI concerning for foci. LP performed and CSF normal. She is being followed by Dr [name] every 6 months with repeat imaging planned. She reports vision loss of left eye resolved. She has had some  blurred/double vision bilaterally and is followed by Dr [name].    Consent Form Botulism Toxin Injection For Chronic Migraine    Reviewed orally with patient, additionally signature is on file:  Botulism toxin has been approved by the Federal drug administration for treatment of chronic migraine. Botulism toxin does not cure chronic migraine and it may not be effective in some patients.  The administration of botulism toxin is accomplished by injecting a small amount of toxin into the muscles of the neck and head. Dosage must be titrated for each individual. Any benefits resulting from botulism toxin tend to wear off after 3 months with a repeat injection required if benefit is to be maintained. Injections are usually done every 3-4 months with maximum effect peak achieved by about 2 or 3 weeks. Botulism toxin is expensive and you should be sure of what costs you will incur resulting from the injection.  The side effects of botulism toxin use for chronic migraine may include:   -Transient, and usually mild, facial weakness with facial injections  -Transient, and usually mild, head or neck weakness with head/neck injections  -Reduction or loss of forehead facial animation due to forehead muscle weakness  -Eyelid drooping  -Dry eye  -Pain at the site of injection or bruising at the site of injection  -Double vision  -Potential unknown long term risks   Contraindications: You should not have Botox if you are pregnant, nursing, allergic to albumin, have an infection, skin condition, or muscle weakness at the site of the injection, or have myasthenia gravis, Lambert-Eaton syndrome,  or ALS.  It is also possible that as with any injection, there may be an allergic reaction or no effect from the medication. Reduced effectiveness after repeated injections is sometimes seen and rarely infection at the injection site may occur. All care will be taken to prevent these side effects. If therapy is given  over a long time, atrophy and wasting in the muscle injected may occur. Occasionally the patient's become refractory to treatment because they develop antibodies to the toxin. In this event, therapy needs to be modified.  I have read the above information and consent to the administration of botulism toxin.    BOTOX PROCEDURE NOTE FOR MIGRAINE HEADACHE  Contraindications and precautions discussed with patient(above). Aseptic procedure was observed and patient tolerated procedure. Procedure performed by Debbora Presto, FNP-C.   The condition has existed for more than 6 months, and pt does not have a diagnosis of ALS, Myasthenia Gravis or Lambert-Eaton Syndrome.  Risks and benefits of injections discussed and pt agrees to proceed with the procedure.  Written consent obtained  These injections are medically necessary. Pt  receives good benefits from these injections. These injections do not cause sedations or hallucinations which the oral therapies may cause.   Description of procedure:  The patient was placed in a sitting position. The standard protocol was used for Botox as follows, with 5 units of Botox injected at each site:  -Procerus muscle, midline injection  -Corrugator muscle, bilateral injection  -Frontalis muscle, bilateral injection, with 2 sites each side, medial injection was performed in the upper one third of the frontalis muscle, in the region vertical from the medial inferior edge of the superior orbital rim. The lateral injection was again in the upper one third of the forehead vertically above the lateral limbus of the cornea, 1.5 cm lateral to the medial injection site.  -Temporalis muscle injection, 4 sites, bilaterally. The first injection was 3 cm above the tragus of the ear, second injection site was 1.5 cm to 3 cm up from the first injection site in line with the tragus of the ear. The third injection site was 1.5-3 cm forward between the first 2 injection sites. The fourth  injection site was 1.5 cm posterior to the second injection site. 5th site laterally in the temporalis  muscleat the level of the outer canthus.  -Occipitalis muscle injection, 3 sites, bilaterally. The first injection was done one half way between the occipital protuberance and the tip of the mastoid process behind the ear. The second injection site was done lateral and superior to the first, 1 fingerbreadth from the first injection. The third injection site was 1 fingerbreadth superiorly and medially from the first injection site.  -Cervical paraspinal muscle injection, 2 sites, bilaterally. The first injection site was 1 cm from the midline of the cervical spine, 3 cm inferior to the lower border of the occipital protuberance. The second injection site was 1.5 cm superiorly and laterally to the first injection site.  -Trapezius muscle injection was performed at 3 sites, bilaterally. The first injection site was in the upper trapezius muscle halfway between the inflection point of the neck, and the acromion. The second injection site was one half way between the acromion and the first injection site. The third injection was done between the first injection site and the inflection point of the neck.   Will return for repeat injection in 3 months.   A total of 200 units of Botox was prepared, 155 units of Botox was  injected as documented above, any Botox not injected was wasted. The patient tolerated the procedure well, there were no complications of the above procedure.

## 2021-05-09 ENCOUNTER — Other Ambulatory Visit: Payer: Self-pay

## 2021-05-09 ENCOUNTER — Ambulatory Visit (INDEPENDENT_AMBULATORY_CARE_PROVIDER_SITE_OTHER): Payer: Medicare Other | Admitting: Family Medicine

## 2021-05-09 ENCOUNTER — Encounter: Payer: Self-pay | Admitting: Family Medicine

## 2021-05-09 DIAGNOSIS — G43709 Chronic migraine without aura, not intractable, without status migrainosus: Secondary | ICD-10-CM

## 2021-05-09 NOTE — Progress Notes (Signed)
Botox- 100 units x 2 vials Lot: [id number] Expiration: 06/2023 NDC: [id number]  Bacteriostatic 0.9% Sodium Chloride- 51mL total Lot: [id number] Expiration: 11/01/2022 NDC: [id number]  Dx: [id number] B/B

## 2021-05-13 ENCOUNTER — Telehealth: Payer: Medicare Other | Admitting: Nurse Practitioner

## 2021-05-13 DIAGNOSIS — A084 Viral intestinal infection, unspecified: Secondary | ICD-10-CM

## 2021-05-13 MED ORDER — ONDANSETRON HCL 4 MG PO TABS: 4.0000 mg | ORAL_TABLET | Freq: Three times a day (TID) | ORAL | 0 refills | Status: AC | PRN

## 2021-05-13 MED ORDER — LOPERAMIDE HCL 2 MG PO CAPS: 2.0000 mg | ORAL_CAPSULE | ORAL | 0 refills | Status: DC | PRN

## 2021-05-13 NOTE — Progress Notes (Signed)
I have spent 5 minutes in review of e-visit questionnaire, review and updating patient chart, medical decision making and response to patient.  ° °Waris Rodger W Lowanda Cashaw, NP ° °  °

## 2021-05-13 NOTE — Progress Notes (Signed)
We are sorry that you are not feeling well.  Here is how we plan to help! ° °Based on what you have shared with me it looks like you have Acute Infectious Diarrhea. ° °Most cases of acute diarrhea are due to infections with virus and bacteria and are self-limited conditions lasting less than 14 days. ° °For your symptoms you may take Imodium 2 mg tablets that are over the counter at your local pharmacy. Take two tablet now and then one after each loose stool up to 6 a day.  °Antibiotics are not needed for most people with diarrhea. ° °Optional: Zofran 4 mg 1 tablet every 8 hours as needed for nausea and vomiting ° ° °HOME CARE °We recommend changing your diet to help with your symptoms for the next few days. °Drink plenty of fluids that contain water salt and sugar. Sports drinks such as Gatorade may help.  °You may try broths, soups, bananas, applesauce, soft breads, mashed potatoes or crackers.  °You are considered infectious for as long as the diarrhea continues. Hand washing or use of alcohol based hand sanitizers is recommend. °It is best to stay out of work or school until your symptoms stop.  ° °GET HELP RIGHT AWAY °If you have dark yellow colored urine or do not pass urine frequently you should drink more fluids.   °If your symptoms worsen  °If you feel like you are going to pass out (faint) °You have a new problem ° °MAKE SURE YOU  °Understand these instructions. °Will watch your condition. °Will get help right away if you are not doing well or get worse. ° °Thank you for choosing an e-visit. ° °Your e-visit answers were reviewed by a board certified advanced clinical practitioner to complete your personal care plan. Depending upon the condition, your plan could have included both over the counter or prescription medications. ° °Please review your pharmacy choice. Make sure the pharmacy is open so you can pick up prescription now. If there is a problem, you may contact your provider through MyChart  messaging and have the prescription routed to another pharmacy.  Your safety is important to us. If you have drug allergies check your prescription carefully.  ° °For the next 24 hours you can use MyChart to ask questions about today's visit, request a non-urgent call back, or ask for a work or school excuse. °You will get an email in the next two days asking about your experience. I hope that your e-visit has been valuable and will speed your recovery. ° °

## 2021-05-19 ENCOUNTER — Other Ambulatory Visit: Payer: Self-pay

## 2021-05-19 ENCOUNTER — Emergency Department (INDEPENDENT_AMBULATORY_CARE_PROVIDER_SITE_OTHER)
Admission: RE | Admit: 2021-05-19 | Discharge: 2021-05-19 | Disposition: A | Discharge status: Home / Self Care | Payer: Medicare Other | Source: Ambulatory Visit

## 2021-05-19 ENCOUNTER — Other Ambulatory Visit (HOSPITAL_COMMUNITY)
Admission: RE | Admit: 2021-05-19 | Discharge: 2021-05-19 | Disposition: A | Discharge status: Home / Self Care | Payer: Medicare Other | Source: Ambulatory Visit | Attending: Emergency Medicine | Admitting: Emergency Medicine

## 2021-05-19 VITALS — BP 97/67 | HR 100 | Temp 98.4°F | Resp 20 | Ht 61.0 in | Wt 110.0 lb

## 2021-05-19 DIAGNOSIS — Z114 Encounter for screening for human immunodeficiency virus [HIV]: Secondary | ICD-10-CM | POA: Diagnosis not present

## 2021-05-19 DIAGNOSIS — Z113 Encounter for screening for infections with a predominantly sexual mode of transmission: Secondary | ICD-10-CM | POA: Insufficient documentation

## 2021-05-19 NOTE — ED Triage Notes (Signed)
Pt states that she would like to be checked for std.  Pt denies any symptoms or exposure.

## 2021-05-19 NOTE — ED Provider Notes (Signed)
[facility]    CSN: [id number] Arrival date & time: 05/19/21  1243      History   Chief Complaint Chief Complaint  Patient presents with   std check    Std check    HPI Tracy Mcconnell is a 26 y.o. female.   Patient presents requesting STI testing. The patient denies any known exposure or any symptoms, including dysuria, discharge, or abdominal pain.   The history is provided by the patient.   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   Severe episode of recurrent major depressive disorder, without psychotic features ([place]) 09/10/2020   Chronic migraine without aura without status migrainosus, not intractable 09/08/2020   Myalgia 09/08/2020   Acute appendicitis 01/17/2020   Abdominal pain 01/16/2020   Left lumbar radiculitis 07/24/2019   Vitamin D deficiency 07/15/2019   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   Numbness 06/25/2019   Blurry vision    Generalized anxiety disorder    Optic neuritis 06/10/2019   Hypovitaminosis D 04/07/2019   Migraines 10/28/2018   Lumbar radiculopathy 10/28/2018   Chronic daily headache 02/04/2017    Past Surgical History:  Procedure Laterality Date   CHOLECYSTECTOMY N/A 10/19/2019   Procedure: LAPAROSCOPIC CHOLECYSTECTOMY;  Surgeon: Clovis Riley, MD;  Location: [facility];  Service: General;  Laterality: N/A;   LAPAROSCOPIC APPENDECTOMY N/A 01/17/2020   Procedure: APPENDECTOMY LAPAROSCOPIC;  Surgeon: Erroll Luna, MD;  Location: [facility];  Service: General;  Laterality: N/A;    OB History   No obstetric history on file.       Home Medications    Prior to Admission medications   Medication Sig Start Date End Date Taking? Authorizing Provider  acetaminophen (TYLENOL) 325 MG tablet Take 2 tablets (650 mg total) by mouth every 6 (six) hours as needed for mild pain (or temp > 100). 01/18/20  Yes Meuth, Brooke A, PA-C  buPROPion (WELLBUTRIN XL) 300 MG 24 hr tablet TAKE 1 TABLET BY MOUTH EVERY DAY IN THE MORNING Patient taking differently: 300 mg daily. 01/09/21  Yes Maximiano Coss, NP  busPIRone (BUSPAR) 30 MG tablet TAKE 1 TABLET BY MOUTH 2 TIMES DAILY. Patient taking differently: Take 30 mg by mouth in the morning and at bedtime. 02/22/21  Yes Nwoko, Uchenna E, PA  clonazePAM (KLONOPIN) 0.5 MG tablet TAKE 1 TABLET BY MOUTH 2 TIMES DAILY AS NEEDED FOR ANXIETY. Patient taking differently: Take 0.5 mg by mouth 2 (two) times daily as needed for anxiety. 01/12/21  Yes Nwoko, Isidoro Donning E, PA  cyclobenzaprine (FLEXERIL) 5 MG tablet TAKE 1 TABLET BY MOUTH THREE TIMES A DAY AS NEEDED FOR MUSCLE SPASMS Patient taking differently: Take 5 mg by mouth 3 (three) times daily as needed for muscle spasms. 01/11/21  Yes Maximiano Coss, NP  Dimethyl Fumarate (TECFIDERA) 240 MG CPDR Take 1 capsule (240 mg total) by mouth in the morning and at bedtime. 01/31/21  Yes Sater, Nanine Means, MD  FLUoxetine (PROZAC) 40 MG capsule TAKE 2 CAPSULES (80 MG TOTAL) BY MOUTH IN THE MORNING. 03/16/21  Yes  Nwoko, Uchenna E, PA  gabapentin (NEURONTIN) 400 MG capsule Take 1 capsule (400 mg total) by mouth 4 (four) times daily -  with meals and at bedtime. 09/13/20  Yes Ethelene Hal, NP  gabapentin (NEURONTIN) 600 MG tablet Take 600 mg by mouth 4 (four) times daily. 01/25/21  Yes [provider]  hydrOXYzine (ATARAX/VISTARIL) 25 MG tablet Take 1 tablet (25 mg total) by mouth 3 (three) times daily as needed for anxiety. 12/07/20  Yes Nwoko, Uchenna E, PA  indomethacin (INDOCIN) 25 MG capsule TAKE 1 CAPSULE (25 MG TOTAL) BY MOUTH DAILY AS  NEEDED. Patient taking differently: Take 25 mg by mouth daily as needed for mild pain. 12/20/20  Yes Sater, Nanine Means, MD  loperamide (IMODIUM) 2 MG capsule Take 1 capsule (2 mg total) by mouth as needed for diarrhea or loose stools. 05/13/21  Yes Gildardo Pounds, NP  naproxen (NAPROSYN) 500 MG tablet Take 1 tablet (500 mg total) by mouth 2 (two) times daily with a meal. 05/06/21  Yes Hassell Done, Mary-Margaret, FNP  ondansetron (ZOFRAN ODT) 4 MG disintegrating tablet Take 1 tablet (4 mg total) by mouth every 8 (eight) hours as needed for nausea or vomiting. 11/11/20  Yes Wieters, Hallie C, PA-C  ondansetron (ZOFRAN) 4 MG tablet Take 1 tablet (4 mg total) by mouth every 8 (eight) hours as needed for nausea or vomiting. 05/13/21  Yes Gildardo Pounds, NP  potassium chloride SA (KLOR-CON) 20 MEQ tablet Take 1 tablet (20 mEq total) by mouth daily. 11/12/20  Yes Drenda Freeze, MD  QUEtiapine (SEROQUEL) 100 MG tablet Take 1 tablet (100 mg total) by mouth at bedtime. 12/07/20  Yes Nwoko, Uchenna E, PA  Ubrogepant (UBRELVY) 100 MG TABS Take 100 mg by mouth daily as needed. Take one tablet at onset of headache, may repeat 1 tablet in 2 hours, no more than 2 tablets in 24 hours 04/21/20  Yes Lomax, Labaron Digirolamo, NP  amoxicillin-clavulanate (AUGMENTIN) 875-125 MG tablet Take 1 tablet by mouth 2 (two) times daily. One po bid x 7 days 03/19/21   Muthersbaugh, Jarrett Soho, PA-C  azithromycin (ZITHROMAX) 500 MG tablet Take 1 tablet (500 mg total) by mouth daily. Patient not taking: Reported on 02/25/2021 02/12/21   Mar Daring, PA-C  lamoTRIgine (LAMICTAL) 25 MG tablet Take 1 tablet at night for 5 days, then 1 in the morning and 1 tablet at night for 5 days, then 1 in the morning and 2 tablets at night for 5 days. Then you can go to 2 tablets in the morning and 2 tablets in the evening thereafter 06/06/20 09/10/20  Sater, Nanine Means, MD  omeprazole (PRILOSEC) 20 MG capsule Take 1 capsule (20 mg total) by mouth daily. 10/11/19 09/10/20   Sharion Balloon, FNP    Family History Family History  Problem Relation Age of Onset   Migraines Mother    Thyroid disease Mother    Cancer Maternal Grandmother    Diabetes Paternal Grandfather    Hypertension Maternal Grandfather    Diabetes Paternal Grandmother    Stroke Paternal Grandmother     Social History Social History   Tobacco Use   Smoking status: Never   Smokeless tobacco: Never  Vaping Use   Vaping Use: Never used  Substance Use Topics   Alcohol use: Not Currently   Drug use: No     Allergies   Sumatriptan and Other   Review of Systems Review of Systems  Constitutional:  Negative for fatigue and fever.  Gastrointestinal:  Negative for abdominal pain.  Genitourinary:  Negative for dysuria, genital sores and vaginal discharge.  Musculoskeletal:  Negative for back pain.  Skin:  Negative for rash.    Physical Exam Triage Vital Signs ED Triage Vitals  Enc Vitals Group     BP 05/19/21 1253 97/67     Pulse Rate 05/19/21 1253 100     Resp 05/19/21 1253 20     Temp 05/19/21 1253 98.4 F (36.9 C)     Temp Source 05/19/21 1253 Oral     SpO2 05/19/21 1253 99 %     Weight 05/19/21 1250 110 lb (49.9 kg)     Height 05/19/21 1250 5\' 1"  (1.549 m)     Head Circumference --      Peak Flow --      Pain Score 05/19/21 1250 0     Pain Loc --      Pain Edu? --      Excl. in [place]? --    No data found.  Updated Vital Signs BP 97/67 (BP Location: Left Arm)    Pulse 100    Temp 98.4 F (36.9 C) (Oral)    Resp 20    Ht 5\' 1"  (1.549 m)    Wt 110 lb (49.9 kg)    LMP 05/15/2021    SpO2 99%    BMI 20.78 kg/m   Visual Acuity Right Eye Distance:   Left Eye Distance:   Bilateral Distance:    Right Eye Near:   Left Eye Near:    Bilateral Near:     Physical Exam Vitals and nursing note reviewed.  Constitutional:      General: She is not in acute distress. Eyes:     Pupils: Pupils are equal, round, and reactive to light.  Cardiovascular:     Rate and Rhythm:  Normal rate and regular rhythm.     Heart sounds: Normal heart sounds.  Pulmonary:     Effort: Pulmonary effort is normal.     Breath sounds: Normal breath sounds.  Abdominal:     Palpations: Abdomen is soft.     Tenderness: There is no abdominal tenderness. There is no right CVA tenderness or left CVA tenderness.  Genitourinary:    Comments: Pt declined Skin:    Findings: No rash.  Neurological:     Mental Status: She is alert.  Psychiatric:        Mood and Affect: Mood normal.     UC Treatments / Results  Labs (all labs ordered are listed, but only abnormal results are displayed) Labs Reviewed  RPR  HIV ANTIBODY (ROUTINE TESTING W REFLEX)  HEPATITIS PANEL, ACUTE  CERVICOVAGINAL ANCILLARY ONLY    EKG   Radiology No results found.  Procedures Procedures (including critical care time)  Medications Ordered in UC Medications - No data to display  Initial Impression / Assessment and Plan / UC Course  I have reviewed the triage vital signs and the nursing notes.  Pertinent labs & imaging results that were available during my care of the patient were reviewed by me and considered in my medical decision making (see chart for details).     Routine STI testing, pt wanted blood work as well.   E/M: 1 acute uncomplicated illness, 4 data (GCT, HIV, hepatitis, RPR), low risk   Final Clinical Impressions(s) / UC Diagnoses   Final diagnoses:  Screen for sexually transmitted diseases  Encounter for screening for human immunodeficiency virus (HIV)  Discharge Instructions      Labs sent out to test for gonorrhea, chlamydia, trich, HIV, hepatitis, and syphilis. We will contact you in a few days with results.     ED Prescriptions   None    PDMP not reviewed this encounter.   Delsa Sale, [place] 05/19/21 1313

## 2021-05-19 NOTE — Discharge Instructions (Signed)
Labs sent out to test for gonorrhea, chlamydia, trich, HIV, hepatitis, and syphilis. We will contact you in a few days with results.

## 2021-05-22 ENCOUNTER — Telehealth: Payer: Self-pay | Admitting: Neurology

## 2021-05-22 ENCOUNTER — Encounter: Payer: Self-pay | Admitting: Neurology

## 2021-05-22 ENCOUNTER — Telehealth: Payer: Medicare Other | Admitting: Physician Assistant

## 2021-05-22 DIAGNOSIS — A749 Chlamydial infection, unspecified: Secondary | ICD-10-CM | POA: Diagnosis not present

## 2021-05-22 LAB — CERVICOVAGINAL ANCILLARY ONLY
Chlamydia: POSITIVE — AB
Comment: NEGATIVE
Comment: NEGATIVE
Comment: NORMAL
Neisseria Gonorrhea: NEGATIVE
Trichomonas: NEGATIVE

## 2021-05-22 LAB — HEPATITIS PANEL, ACUTE
Hep A IgM: NONREACTIVE
Hep B C IgM: NONREACTIVE
Hepatitis B Surface Ag: NONREACTIVE
Hepatitis C Ab: NONREACTIVE
SIGNAL TO CUT-OFF: 0.02 (ref <1.00)

## 2021-05-22 LAB — RPR: RPR Ser Ql: NONREACTIVE

## 2021-05-22 LAB — HIV ANTIBODY (ROUTINE TESTING W REFLEX): HIV 1&2 Ab, 4th Generation: NONREACTIVE

## 2021-05-22 MED ORDER — DOXYCYCLINE HYCLATE 100 MG PO TABS
100.0000 mg | ORAL_TABLET | Freq: Two times a day (BID) | ORAL | 0 refills | Status: DC
Start: 2021-05-22 — End: 2021-07-06

## 2021-05-22 NOTE — Telephone Encounter (Signed)
Called the phone number provided to initiate the prior authorization over the phone. Prior to calling the insurance I called the patient to get member ID as their is no insurance card on file. Pt states she has not received the new card yet and didn't know her member id #.  Upon speaking with representative, Zollie Scale she was able to initiate the PA over the phone. Pt member ID: [id number] PA approved over the phone and the coverage is from 04/22/2021-05/22/2022. Will inform the pt on mychart

## 2021-05-22 NOTE — Progress Notes (Signed)
Virtual Visit Consent   Tracy Mcconnell, you are scheduled for a virtual visit with a [facility] provider today.     Just as with appointments in the office, your consent must be obtained to participate.  Your consent will be active for this visit and any virtual visit you may have with one of our providers in the next 365 days.     If you have a MyChart account, a copy of this consent can be sent to you electronically.  All virtual visits are billed to your insurance company just like a traditional visit in the office.    As this is a virtual visit, video technology does not allow for your provider to perform a traditional examination.  This may limit your provider's ability to fully assess your condition.  If your provider identifies any concerns that need to be evaluated in person or the need to arrange testing (such as labs, EKG, etc.), we will make arrangements to do so.     Although advances in technology are sophisticated, we cannot ensure that it will always work on either your end or our end.  If the connection with a video visit is poor, the visit may have to be switched to a telephone visit.  With either a video or telephone visit, we are not always able to ensure that we have a secure connection.     I need to obtain your verbal consent now.   Are you willing to proceed with your visit today?    Tracy Mcconnell has provided verbal consent on 05/22/2021 for a virtual visit (video or telephone).   Tracy Daring, PA-C   Date: 05/22/2021 4:01 PM   Virtual Visit via Video Note   IMar Mcconnell, connected with  Tracy Mcconnell  ([id number], 1995-07-12) on 05/22/21 at  4:00 PM EST by a video-enabled telemedicine application and verified that I am speaking with the correct person using two identifiers.  Location: Patient: Virtual Visit Location Patient: Home Provider: Virtual Visit Location Provider: Home Office   I discussed the limitations of  evaluation and management by telemedicine and the availability of in person appointments. The patient expressed understanding and agreed to proceed.    History of Present Illness: Tracy Mcconnell is a 26 y.o. who identifies as a female who was assigned female at birth, and is being seen today for STI follow up. Seen in person at [facility] on 05/19/21. Tested positive for chlamydia. Has not undergone treatment as of yet. Test results came back today. Has not been contacted by UC as of yet and is seeking treatment.   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   Severe episode of recurrent major depressive disorder, without psychotic features ([place]) 09/10/2020   Chronic migraine without aura without status migrainosus, not intractable 09/08/2020   Myalgia 09/08/2020   Acute appendicitis 01/17/2020   Abdominal pain 01/16/2020   Left lumbar radiculitis 07/24/2019   Vitamin D deficiency 07/15/2019   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   Numbness 06/25/2019   Blurry vision    Generalized anxiety disorder    Optic neuritis 06/10/2019   Hypovitaminosis D 04/07/2019   Migraines 10/28/2018   Lumbar radiculopathy 10/28/2018   Chronic daily headache 02/04/2017    Allergies:  Allergies  Allergen Reactions   Sumatriptan  Shortness Of Breath and Other (See Comments)    Entire body "felt heavy"   Other Itching, Swelling and Other (See Comments)    Pesticides (family history)- Exposure face and mouth itch and the tongue/lips swell   Medications:  Current Outpatient Medications:    doxycycline (VIBRA-TABS) 100 MG tablet, Take 1 tablet (100 mg total) by mouth 2 (two) times daily., Disp: 14 tablet, Rfl: 0   acetaminophen (TYLENOL) 325 MG tablet, Take 2 tablets (650 mg total) by mouth every 6 (six) hours as needed for mild pain (or  temp > 100)., Disp: , Rfl:    azithromycin (ZITHROMAX) 500 MG tablet, Take 1 tablet (500 mg total) by mouth daily. (Patient not taking: Reported on 02/25/2021), Disp: 3 tablet, Rfl: 0   buPROPion (WELLBUTRIN XL) 300 MG 24 hr tablet, TAKE 1 TABLET BY MOUTH EVERY DAY IN THE MORNING (Patient taking differently: 300 mg daily.), Disp: 90 tablet, Rfl: 1   busPIRone (BUSPAR) 30 MG tablet, TAKE 1 TABLET BY MOUTH 2 TIMES DAILY. (Patient taking differently: Take 30 mg by mouth in the morning and at bedtime.), Disp: 60 tablet, Rfl: 1   clonazePAM (KLONOPIN) 0.5 MG tablet, TAKE 1 TABLET BY MOUTH 2 TIMES DAILY AS NEEDED FOR ANXIETY. (Patient taking differently: Take 0.5 mg by mouth 2 (two) times daily as needed for anxiety.), Disp: 60 tablet, Rfl: 0   cyclobenzaprine (FLEXERIL) 5 MG tablet, TAKE 1 TABLET BY MOUTH THREE TIMES A DAY AS NEEDED FOR MUSCLE SPASMS (Patient taking differently: Take 5 mg by mouth 3 (three) times daily as needed for muscle spasms.), Disp: 30 tablet, Rfl: 1   Dimethyl Fumarate (TECFIDERA) 240 MG CPDR, Take 1 capsule (240 mg total) by mouth in the morning and at bedtime., Disp: 180 capsule, Rfl: 3   FLUoxetine (PROZAC) 40 MG capsule, TAKE 2 CAPSULES (80 MG TOTAL) BY MOUTH IN THE MORNING., Disp: 30 capsule, Rfl: 1   gabapentin (NEURONTIN) 400 MG capsule, Take 1 capsule (400 mg total) by mouth 4 (four) times daily -  with meals and at bedtime., Disp: 120 capsule, Rfl: 0   gabapentin (NEURONTIN) 600 MG tablet, Take 600 mg by mouth 4 (four) times daily., Disp: , Rfl:    hydrOXYzine (ATARAX/VISTARIL) 25 MG tablet, Take 1 tablet (25 mg total) by mouth 3 (three) times daily as needed for anxiety., Disp: 30 tablet, Rfl: 1   indomethacin (INDOCIN) 25 MG capsule, TAKE 1 CAPSULE (25 MG TOTAL) BY MOUTH DAILY AS NEEDED. (Patient taking differently: Take 25 mg by mouth daily as needed for mild pain.), Disp: 30 capsule, Rfl: 2   loperamide (IMODIUM) 2 MG capsule, Take 1 capsule (2 mg total) by mouth as needed  for diarrhea or loose stools., Disp: 30 capsule, Rfl: 0   naproxen (NAPROSYN) 500 MG tablet, Take 1 tablet (500 mg total) by mouth 2 (two) times daily with a meal., Disp: 60 tablet, Rfl: 1   ondansetron (ZOFRAN ODT) 4 MG disintegrating tablet, Take 1 tablet (4 mg total) by mouth every 8 (eight) hours as needed for nausea or vomiting., Disp: 20 tablet, Rfl: 0   ondansetron (ZOFRAN) 4 MG tablet, Take 1 tablet (4 mg total) by mouth every 8 (eight) hours as needed for nausea or vomiting., Disp: 20 tablet, Rfl: 0   potassium chloride SA (KLOR-CON) 20 MEQ tablet, Take 1 tablet (20 mEq total) by mouth daily., Disp: 7 tablet, Rfl: 0   QUEtiapine (SEROQUEL) 100 MG tablet, Take 1 tablet (100 mg total) by mouth at  bedtime., Disp: 30 tablet, Rfl: 1   Ubrogepant (UBRELVY) 100 MG TABS, Take 100 mg by mouth daily as needed. Take one tablet at onset of headache, may repeat 1 tablet in 2 hours, no more than 2 tablets in 24 hours, Disp: 10 tablet, Rfl: 11  Observations/Objective: Patient is well-developed, well-nourished in no acute distress.  Resting comfortably at home.  Head is normocephalic, atraumatic.  No labored breathing.  Speech is clear and coherent with logical content.  Patient is alert and oriented at baseline.    Assessment and Plan: 1. Chlamydia - doxycycline (VIBRA-TABS) 100 MG tablet; Take 1 tablet (100 mg total) by mouth 2 (two) times daily.  Dispense: 14 tablet; Refill: 0  - Will prescribe Doxycycline - Discussed no sexual intercourse while on treatment and for 7 days following - Advised safe sex practices - Advised UC may still call, just to inform that she is on treatment - Make sure all partners are treated fully before sexual intercourse - Can TOC 2 weeks following treatment if desired  Follow Up Instructions: I discussed the assessment and treatment plan with the patient. The patient was provided an opportunity to ask questions and all were answered. The patient agreed with the  plan and demonstrated an understanding of the instructions.  A copy of instructions were sent to the patient via MyChart unless otherwise noted below.   The patient was advised to call back or seek an in-person evaluation if the symptoms worsen or if the condition fails to improve as anticipated.  Time:  I spent 12 minutes with the patient via telehealth technology discussing the above problems/concerns.    Tracy Daring, PA-C

## 2021-05-22 NOTE — Patient Instructions (Signed)
Jhane J Ramsey-Peralta, thank you for joining Mar Daring, PA-C for today's virtual visit.  While this provider is not your primary care provider (PCP), if your PCP is located in our provider database this encounter information will be shared with them immediately following your visit.  Consent: (Patient) Nayeli J Ramsey-Peralta provided verbal consent for this virtual visit at the beginning of the encounter.  Current Medications:  Current Outpatient Medications:    doxycycline (VIBRA-TABS) 100 MG tablet, Take 1 tablet (100 mg total) by mouth 2 (two) times daily., Disp: 14 tablet, Rfl: 0   acetaminophen (TYLENOL) 325 MG tablet, Take 2 tablets (650 mg total) by mouth every 6 (six) hours as needed for mild pain (or temp > 100)., Disp: , Rfl:    azithromycin (ZITHROMAX) 500 MG tablet, Take 1 tablet (500 mg total) by mouth daily. (Patient not taking: Reported on 02/25/2021), Disp: 3 tablet, Rfl: 0   buPROPion (WELLBUTRIN XL) 300 MG 24 hr tablet, TAKE 1 TABLET BY MOUTH EVERY DAY IN THE MORNING (Patient taking differently: 300 mg daily.), Disp: 90 tablet, Rfl: 1   busPIRone (BUSPAR) 30 MG tablet, TAKE 1 TABLET BY MOUTH 2 TIMES DAILY. (Patient taking differently: Take 30 mg by mouth in the morning and at bedtime.), Disp: 60 tablet, Rfl: 1   clonazePAM (KLONOPIN) 0.5 MG tablet, TAKE 1 TABLET BY MOUTH 2 TIMES DAILY AS NEEDED FOR ANXIETY. (Patient taking differently: Take 0.5 mg by mouth 2 (two) times daily as needed for anxiety.), Disp: 60 tablet, Rfl: 0   cyclobenzaprine (FLEXERIL) 5 MG tablet, TAKE 1 TABLET BY MOUTH THREE TIMES A DAY AS NEEDED FOR MUSCLE SPASMS (Patient taking differently: Take 5 mg by mouth 3 (three) times daily as needed for muscle spasms.), Disp: 30 tablet, Rfl: 1   Dimethyl Fumarate (TECFIDERA) 240 MG CPDR, Take 1 capsule (240 mg total) by mouth in the morning and at bedtime., Disp: 180 capsule, Rfl: 3   FLUoxetine (PROZAC) 40 MG capsule, TAKE 2 CAPSULES (80 MG TOTAL) BY  MOUTH IN THE MORNING., Disp: 30 capsule, Rfl: 1   gabapentin (NEURONTIN) 400 MG capsule, Take 1 capsule (400 mg total) by mouth 4 (four) times daily -  with meals and at bedtime., Disp: 120 capsule, Rfl: 0   gabapentin (NEURONTIN) 600 MG tablet, Take 600 mg by mouth 4 (four) times daily., Disp: , Rfl:    hydrOXYzine (ATARAX/VISTARIL) 25 MG tablet, Take 1 tablet (25 mg total) by mouth 3 (three) times daily as needed for anxiety., Disp: 30 tablet, Rfl: 1   indomethacin (INDOCIN) 25 MG capsule, TAKE 1 CAPSULE (25 MG TOTAL) BY MOUTH DAILY AS NEEDED. (Patient taking differently: Take 25 mg by mouth daily as needed for mild pain.), Disp: 30 capsule, Rfl: 2   loperamide (IMODIUM) 2 MG capsule, Take 1 capsule (2 mg total) by mouth as needed for diarrhea or loose stools., Disp: 30 capsule, Rfl: 0   naproxen (NAPROSYN) 500 MG tablet, Take 1 tablet (500 mg total) by mouth 2 (two) times daily with a meal., Disp: 60 tablet, Rfl: 1   ondansetron (ZOFRAN ODT) 4 MG disintegrating tablet, Take 1 tablet (4 mg total) by mouth every 8 (eight) hours as needed for nausea or vomiting., Disp: 20 tablet, Rfl: 0   ondansetron (ZOFRAN) 4 MG tablet, Take 1 tablet (4 mg total) by mouth every 8 (eight) hours as needed for nausea or vomiting., Disp: 20 tablet, Rfl: 0   potassium chloride SA (KLOR-CON) 20 MEQ tablet, Take 1 tablet (  20 mEq total) by mouth daily., Disp: 7 tablet, Rfl: 0   QUEtiapine (SEROQUEL) 100 MG tablet, Take 1 tablet (100 mg total) by mouth at bedtime., Disp: 30 tablet, Rfl: 1   Ubrogepant (UBRELVY) 100 MG TABS, Take 100 mg by mouth daily as needed. Take one tablet at onset of headache, may repeat 1 tablet in 2 hours, no more than 2 tablets in 24 hours, Disp: 10 tablet, Rfl: 11   Medications ordered in this encounter:  Meds ordered this encounter  Medications   doxycycline (VIBRA-TABS) 100 MG tablet    Sig: Take 1 tablet (100 mg total) by mouth 2 (two) times daily.    Dispense:  14 tablet    Refill:  0     Order Specific Question:   Supervising Provider    Answer:   Sabra Heck, BRIAN [[id number]]     *If you need refills on other medications prior to your next appointment, please contact your pharmacy*  Follow-Up: Call back or seek an in-person evaluation if the symptoms worsen or if the condition fails to improve as anticipated.  Other Instructions Chlamydia, Female Chlamydia is an STI (sexually transmitted infection) that is caused by bacteria. This infection spreads through sexual contact. Chlamydia can occur in different areas of the body, including: The urethra. This is the part of the body that drains urine from the bladder. The cervix. This is the lowest part of the uterus. The throat. The rectum. This condition is not difficult to treat. However, if left untreated, chlamydia can lead to more serious health problems, including pelvic inflammatory disease (PID). PID can increase your risk of being unable to have children. Also, women with untreated chlamydia who are pregnant or become pregnant can spread the infection to their babies during delivery. This may cause serious health problems for their babies. What are the causes? This condition is caused by the bacteria Chlamydia trachomatis. The bacteria are spread from an infected partner during sexual activity. Chlamydia can spread through contact with the genitals, mouth, or rectum. What increases the risk? The following factors may make you more likely to develop this condition: Not using a condom the right way or not using a condom every time you have sex. Having a new sex partner or having more than one sex partner. Being female, age 33-25, and sexually active. What are the signs or symptoms? In some cases, there are no symptoms, especially early in the infection. Having no symptoms is also called being asymptomatic. If symptoms develop, they may include: Urinating often, or a burning feeling during urination. Discharge from the  vagina. Redness, soreness, or swelling of the rectum, or bleeding or discharge coming from the rectum. Any of these may occur if the infection was spread through anal sex. Pain in the abdomen. Pain during sex. Bleeding between menstrual periods or irregular periods. Itching, burning, or redness in the eyes, or discharge from the eyes. How is this diagnosed? This condition may be diagnosed with: Urine tests. Swab tests. Depending on your symptoms, your health care provider may use a cotton swab to collect discharge from your vagina or rectum to test for the bacteria. A pelvic exam. How is this treated? This condition is treated with antibiotic medicines. If you are pregnant, you will need to avoid certain types of antibiotics. Follow these instructions at home: Sexual activity Tell your sex partner or partners about your infection. These include any partners for oral, anal, or vaginal sex that you have had within 60 days of  when your symptoms started. Sex partners should also be treated, even if they have no signs of the disease. Do not have sex until you and your sex partners have completed treatment and your health care provider says it is okay. If your health care provider prescribed you a single-dose medicine as treatment, wait at least 7 days after taking the medicine before having sex. General instructions Take over-the-counter and prescription medicines as told by your health care provider. Finish all antibiotic medicine even when you start to feel better. It is up to you to get your test results. Ask your health care provider, or the department that is doing the test, when your results will be ready. Get plenty of rest. Eat a healthy, well-balanced diet. Drink enough fluids to keep your urine pale yellow. Keep all follow-up visits as told by your health care provider. This is important. You may need to be tested for infection again 3 months after treatment. How is this prevented? The  only sure way to prevent chlamydia is to avoid having vaginal, anal, and oral sex. However, you can lower your risk by: Using latex condoms correctly every time you have sex. Not having multiple sex partners. Asking if your sex partner has been tested for STIs and had negative results. Getting regular health screenings to check for STIs. Contact a health care provider if: You develop new symptoms or your symptoms do not get better after you complete treatment. You have a fever or chills. You have pain during sex. You develop new joint pain or swelling near your joints. You have irregular menstrual periods, or you have bleeding between periods or after sex. You develop flu-like symptoms, such as night sweats, sore throat, or muscle aches. You are pregnant and you develop symptoms of chlamydia. Get help right away if: Your pain gets worse and does not get better with medicine. You have pain in your abdomen or lower back that does not get better with medicine. You feel weak or dizzy, or you faint. Summary Chlamydia is an STI (sexually transmitted infection) that is caused by bacteria. This infection spreads through sexual contact. This condition is not difficult to treat. However, if left untreated, chlamydia can lead to more serious health problems, including pelvic inflammatory disease (PID). Some people have no symptoms (are asymptomatic), especially early in the infection. This condition is treated with antibiotic medicines. Using latex condoms correctly every time you have sex can help prevent chlamydia. This information is not intended to replace advice given to you by your health care provider. Make sure you discuss any questions you have with your health care provider. Document Revised: 03/16/2019 Document Reviewed: 03/16/2019 Elsevier Patient Education  2022 [employer].    If you have been instructed to have an in-person evaluation today at a local Urgent Care facility, please  use the link below. It will take you to a list of all of our available [facility], including address, phone number and hours of operation. Please do not delay care.  [facility]  If you or a family member do not have a primary care provider, use the link below to schedule a visit and establish care. When you choose a [facility] primary care physician or advanced practice provider, you gain a long-term partner in health. Find a Primary Care Provider  Learn more about [facility]'s in-office and virtual care options: [facility]

## 2021-05-22 NOTE — Telephone Encounter (Signed)
[facility] Tracy Mcconnell) need PA for Dimethyl Fumarate (TECFIDERA) 240 MG CPDR .  Insurance PA department phone [phone]

## 2021-05-25 ENCOUNTER — Telehealth: Payer: Self-pay | Admitting: Neurology

## 2021-05-25 NOTE — Telephone Encounter (Signed)
[facility] Darl Pikes) get a new PA on file Dimethyl Fumarate (TECFIDERA) 240 MG CPDR. The one on file is about to expire.  Fax: [phone]

## 2021-05-25 NOTE — Telephone Encounter (Signed)
PA was already completed this past Monday over the phone and was approved.  "PA approved over the phone and the coverage is from 04/22/2021-05/22/2022."

## 2021-06-21 ENCOUNTER — Other Ambulatory Visit: Payer: Self-pay

## 2021-06-21 ENCOUNTER — Ambulatory Visit (INDEPENDENT_AMBULATORY_CARE_PROVIDER_SITE_OTHER): Payer: Medicare Other | Admitting: Registered Nurse

## 2021-06-21 ENCOUNTER — Encounter: Payer: Self-pay | Admitting: Registered Nurse

## 2021-06-21 VITALS — BP 109/63 | HR 85 | Temp 97.7°F | Resp 18 | Ht 61.0 in | Wt 101.0 lb

## 2021-06-21 DIAGNOSIS — H539 Unspecified visual disturbance: Secondary | ICD-10-CM

## 2021-06-21 DIAGNOSIS — Z8349 Family history of other endocrine, nutritional and metabolic diseases: Secondary | ICD-10-CM | POA: Diagnosis not present

## 2021-06-21 DIAGNOSIS — R1114 Bilious vomiting: Secondary | ICD-10-CM

## 2021-06-21 DIAGNOSIS — S060X0A Concussion without loss of consciousness, initial encounter: Secondary | ICD-10-CM | POA: Diagnosis not present

## 2021-06-21 MED ORDER — ONDANSETRON 4 MG PO TBDP: 4.0000 mg | ORAL_TABLET | Freq: Three times a day (TID) | ORAL | 0 refills | Status: DC | PRN

## 2021-06-21 NOTE — Progress Notes (Signed)
? ?Acute Office Visit ? ?Subjective:  ? ? Patient ID: Tracy Mcconnell, female    DOB: [birth year], 26 y.o.   MRN: [id number] ? ?Chief Complaint  ?Patient presents with  ? Follow-up  ?  Patient states last week she hit her head hard and had a concussion. Patient can not be seen until 06/29/2021 but does not feel well. Headache , left eye low vision , numbness in left arm  ? ? ?HPI ?Patient is in today for fall ? ?Last  ?Was in the kitchen - fridge - object fell out of fridge and when bending to grab it, hit her head on freezer drawer very hard.  ?Felt concussed ?Headache  ?Now having L eye blurring, L arm is intermittently painful and numb,  ?Does have some urinary and fecal urgency since. No partial or complete incontinence.  ? ?Notes L eye feels like optic neuritis but without all of the pain ? ?Nausea and vomiting within 1-2 days after. ?Has persisted, staying about the same - 1 or 2 episodes of vomiting daily.  ? ?Outpatient Medications Prior to Visit  ?Medication Sig Dispense Refill  ? acetaminophen (TYLENOL) 325 MG tablet Take 2 tablets (650 mg total) by mouth every 6 (six) hours as needed for mild pain (or temp > 100).    ? buPROPion (WELLBUTRIN XL) 300 MG 24 hr tablet TAKE 1 TABLET BY MOUTH EVERY DAY IN THE MORNING (Patient taking differently: 300 mg daily.) 90 tablet 1  ? busPIRone (BUSPAR) 30 MG tablet TAKE 1 TABLET BY MOUTH 2 TIMES DAILY. (Patient taking differently: Take 30 mg by mouth in the morning and at bedtime.) 60 tablet 1  ? clonazePAM (KLONOPIN) 0.5 MG tablet TAKE 1 TABLET BY MOUTH 2 TIMES DAILY AS NEEDED FOR ANXIETY. (Patient taking differently: Take 0.5 mg by mouth 2 (two) times daily as needed for anxiety.) 60 tablet 0  ? cyclobenzaprine (FLEXERIL) 5 MG tablet TAKE 1 TABLET BY MOUTH THREE TIMES A DAY AS NEEDED FOR MUSCLE SPASMS (Patient taking differently: Take 5 mg by mouth 3 (three) times daily as needed for muscle spasms.) 30 tablet 1  ? Dimethyl Fumarate (TECFIDERA) 240 MG CPDR Take 1  capsule (240 mg total) by mouth in the morning and at bedtime. 180 capsule 3  ? doxycycline (VIBRA-TABS) 100 MG tablet Take 1 tablet (100 mg total) by mouth 2 (two) times daily. 14 tablet 0  ? FLUoxetine (PROZAC) 40 MG capsule TAKE 2 CAPSULES (80 MG TOTAL) BY MOUTH IN THE MORNING. 30 capsule 1  ? gabapentin (NEURONTIN) 400 MG capsule Take 1 capsule (400 mg total) by mouth 4 (four) times daily -  with meals and at bedtime. 120 capsule 0  ? gabapentin (NEURONTIN) 600 MG tablet Take 600 mg by mouth 4 (four) times daily.    ? hydrOXYzine (ATARAX/VISTARIL) 25 MG tablet Take 1 tablet (25 mg total) by mouth 3 (three) times daily as needed for anxiety. 30 tablet 1  ? indomethacin (INDOCIN) 25 MG capsule TAKE 1 CAPSULE (25 MG TOTAL) BY MOUTH DAILY AS NEEDED. (Patient taking differently: Take 25 mg by mouth daily as needed for mild pain.) 30 capsule 2  ? loperamide (IMODIUM) 2 MG capsule Take 1 capsule (2 mg total) by mouth as needed for diarrhea or loose stools. 30 capsule 0  ? naproxen (NAPROSYN) 500 MG tablet Take 1 tablet (500 mg total) by mouth 2 (two) times daily with a meal. 60 tablet 1  ? ondansetron (ZOFRAN) 4 MG tablet Take 1  tablet (4 mg total) by mouth every 8 (eight) hours as needed for nausea or vomiting. 20 tablet 0  ? potassium chloride SA (KLOR-CON) 20 MEQ tablet Take 1 tablet (20 mEq total) by mouth daily. 7 tablet 0  ? QUEtiapine (SEROQUEL) 100 MG tablet Take 1 tablet (100 mg total) by mouth at bedtime. 30 tablet 1  ? Ubrogepant (UBRELVY) 100 MG TABS Take 100 mg by mouth daily as needed. Take one tablet at onset of headache, may repeat 1 tablet in 2 hours, no more than 2 tablets in 24 hours 10 tablet 11  ? ondansetron (ZOFRAN ODT) 4 MG disintegrating tablet Take 1 tablet (4 mg total) by mouth every 8 (eight) hours as needed for nausea or vomiting. 20 tablet 0  ? azithromycin (ZITHROMAX) 500 MG tablet Take 1 tablet (500 mg total) by mouth daily. (Patient not taking: Reported on 02/25/2021) 3 tablet 0  ? ?No  facility-administered medications prior to visit.  ? ? ?Review of Systems ?Per hpi  ? ?   ?Objective:  ?  ?BP 109/63   Pulse 85   Temp 97.7 ?F (36.5 ?C) (Temporal)   Resp 18   Ht 5\' 1"  (1.549 m)   Wt 101 lb (45.8 kg)   SpO2 100%   BMI 19.08 kg/m?  ?Physical Exam ?Vitals and nursing note reviewed.  ?Constitutional:   ?   General: She is not in acute distress. ?   Appearance: Normal appearance. She is normal weight. She is not ill-appearing, toxic-appearing or diaphoretic.  ?Cardiovascular:  ?   Rate and Rhythm: Normal rate and regular rhythm.  ?   Heart sounds: Normal heart sounds. No murmur heard. ?  No friction rub. No gallop.  ?Pulmonary:  ?   Effort: Pulmonary effort is normal. No respiratory distress.  ?   Breath sounds: Normal breath sounds. No stridor. No wheezing, rhonchi or rales.  ?Chest:  ?   Chest wall: No tenderness.  ?Musculoskeletal:     ?   General: No swelling or deformity. Normal range of motion.  ?Skin: ?   General: Skin is warm and dry.  ?   Capillary Refill: Capillary refill takes less than 2 seconds.  ?Neurological:  ?   General: No focal deficit present.  ?   Mental Status: She is alert and oriented to person, place, and time. Mental status is at baseline.  ?   Cranial Nerves: No cranial nerve deficit.  ?   Sensory: No sensory deficit.  ?   Motor: No weakness.  ?   Coordination: Coordination normal.  ?   Gait: Gait normal.  ?   Deep Tendon Reflexes: Reflexes normal.  ?Psychiatric:     ?   Mood and Affect: Mood normal.     ?   Behavior: Behavior normal.     ?   Thought Content: Thought content normal.     ?   Judgment: Judgment normal.  ? ? ?No results found for any visits on 06/21/21. ? ? ?   ?Assessment & Plan:  ?1. Bilious vomiting with nausea ?- ondansetron (ZOFRAN ODT) 4 MG disintegrating tablet; Take 1 tablet (4 mg total) by mouth every 8 (eight) hours as needed for nausea or vomiting.  Dispense: 20 tablet; Refill: 0 ?- CBC with Differential/Platelet ?- Comprehensive metabolic  panel ?- Urinalysis, Routine w reflex microscopic ?- CT HEAD WO CONTRAST (5MM); Future ? ?2. Concussion without loss of consciousness, initial encounter ?- CBC with Differential/Platelet ?- Comprehensive metabolic panel ?- Urinalysis,  Routine w reflex microscopic ?- CT HEAD WO CONTRAST (5MM); Future ? ?3. Visual changes ?- CBC with Differential/Platelet ?- Comprehensive metabolic panel ?- Urinalysis, Routine w reflex microscopic ?- CT HEAD WO CONTRAST (5MM); Future ? ?4. Family history of thyroid disease ?- TSH ?- T4, free ? ? ? ?Meds ordered this encounter  ?Medications  ? ondansetron (ZOFRAN ODT) 4 MG disintegrating tablet  ?  Sig: Take 1 tablet (4 mg total) by mouth every 8 (eight) hours as needed for nausea or vomiting.  ?  Dispense:  20 tablet  ?  Refill:  0  ?  Order Specific Question:   Supervising Provider  ?  Answer:   Carlota Raspberry, JEFFREY R [[id number]]  ? ? ?Return if symptoms worsen or fail to improve. ? ?PLAN ?Exam reassuring ?Will obtain CT head wo to rule out acute abnormality ?Zofran for nv ?Labs collected. Will follow up with the patient as warranted. ?Patient encouraged to call clinic with any questions, comments, or concerns. ? ?Maximiano Coss, NP ?

## 2021-06-21 NOTE — Patient Instructions (Addendum)
Tracy Mcconnell -  ? ?Great to see you ? ?Call if you need anything  ? ?Zofran as needed ? ?CT hopefully asap ? ?Thanks, ? ?Rich  ? ? ? ?If you have lab work done today you will be contacted with your lab results within the next 2 weeks.  If you have not heard from [place] then please contact us. The fastest way to get your results is to register for My Chart. ? ? ?IF you received an x-ray today, you will receive an invoice from [facility]. Please contact [facility] at [phone] with questions or concerns regarding your invoice.  ? ?IF you received labwork today, you will receive an invoice from [facility]. Please contact [facility] at [phone] with questions or concerns regarding your invoice.  ? ?Our billing staff will not be able to assist you with questions regarding bills from these companies. ? ?You will be contacted with the lab results as soon as they are available. The fastest way to get your results is to activate your My Chart account. Instructions are located on the last page of this paperwork. If you have not heard from [place] regarding the results in 2 weeks, please contact this office. ?  ? ? ?

## 2021-06-22 ENCOUNTER — Ambulatory Visit
Admission: RE | Admit: 2021-06-22 | Discharge: 2021-06-22 | Disposition: A | Discharge status: Home / Self Care | Payer: Medicare Other | Source: Ambulatory Visit | Attending: Registered Nurse | Admitting: Registered Nurse

## 2021-06-22 DIAGNOSIS — S060X0A Concussion without loss of consciousness, initial encounter: Secondary | ICD-10-CM

## 2021-06-22 DIAGNOSIS — R1114 Bilious vomiting: Secondary | ICD-10-CM

## 2021-06-22 DIAGNOSIS — H539 Unspecified visual disturbance: Secondary | ICD-10-CM

## 2021-06-22 LAB — URINALYSIS, ROUTINE W REFLEX MICROSCOPIC
Bilirubin Urine: NEGATIVE
Hgb urine dipstick: NEGATIVE
Ketones, ur: NEGATIVE
Leukocytes,Ua: NEGATIVE
Nitrite: NEGATIVE
RBC / HPF: NONE SEEN (ref 0–?)
Specific Gravity, Urine: <=1.005 — AB (ref 1.000–1.030)
Total Protein, Urine: NEGATIVE
Urine Glucose: NEGATIVE
Urobilinogen, UA: 0.2 (ref 0.0–1.0)
WBC, UA: NONE SEEN (ref 0–?)
pH: 7 (ref 5.0–8.0)

## 2021-06-22 LAB — CBC WITH DIFFERENTIAL/PLATELET
Basophils Absolute: 0.1 10*3/uL (ref 0.0–0.1)
Basophils Relative: 0.7 % (ref 0.0–3.0)
Eosinophils Absolute: 0.2 10*3/uL (ref 0.0–0.7)
Eosinophils Relative: 2.2 % (ref 0.0–5.0)
HCT: 39.2 % (ref 36.0–46.0)
Hemoglobin: 13.3 g/dL (ref 12.0–15.0)
Lymphocytes Relative: 27.4 % (ref 12.0–46.0)
Lymphs Abs: 2 10*3/uL (ref 0.7–4.0)
MCHC: 33.9 g/dL (ref 30.0–36.0)
MCV: 89 fl (ref 78.0–100.0)
Monocytes Absolute: 0.5 10*3/uL (ref 0.1–1.0)
Monocytes Relative: 6.6 % (ref 3.0–12.0)
Neutro Abs: 4.7 10*3/uL (ref 1.4–7.7)
Neutrophils Relative %: 63.1 % (ref 43.0–77.0)
Platelets: 347 10*3/uL (ref 150.0–400.0)
RBC: 4.4 Mil/uL (ref 3.87–5.11)
RDW: 13.6 % (ref 11.5–15.5)
WBC: 7.5 10*3/uL (ref 4.0–10.5)

## 2021-06-22 LAB — COMPREHENSIVE METABOLIC PANEL
ALT: 8 U/L (ref 0–35)
AST: 13 U/L (ref 0–37)
Albumin: 4.4 g/dL (ref 3.5–5.2)
Alkaline Phosphatase: 81 U/L (ref 39–117)
BUN: 7 mg/dL (ref 6–23)
CO2: 28 mEq/L (ref 19–32)
Calcium: 9.3 mg/dL (ref 8.4–10.5)
Chloride: 106 mEq/L (ref 96–112)
Creatinine, Ser: 0.61 mg/dL (ref 0.40–1.20)
GFR: 123.6 mL/min (ref >60.00)
Glucose, Bld: 85 mg/dL (ref 70–99)
Potassium: 4 mEq/L (ref 3.5–5.1)
Sodium: 141 mEq/L (ref 135–145)
Total Bilirubin: 0.4 mg/dL (ref 0.2–1.2)
Total Protein: 6.8 g/dL (ref 6.0–8.3)

## 2021-06-22 LAB — T4, FREE: Free T4: 0.83 ng/dL (ref 0.60–1.60)

## 2021-06-22 LAB — TSH: TSH: 0.91 u[IU]/mL (ref 0.35–5.50)

## 2021-06-27 ENCOUNTER — Telehealth: Payer: Medicare Other | Admitting: Physician Assistant

## 2021-06-27 DIAGNOSIS — N898 Other specified noninflammatory disorders of vagina: Secondary | ICD-10-CM

## 2021-06-27 DIAGNOSIS — R399 Unspecified symptoms and signs involving the genitourinary system: Secondary | ICD-10-CM

## 2021-06-27 DIAGNOSIS — N941 Unspecified dyspareunia: Secondary | ICD-10-CM

## 2021-06-27 NOTE — Progress Notes (Signed)
Based on what you shared with me, I feel your condition warrants further evaluation and I recommend that you be seen for a face to face visit.  Please contact your primary care physician practice to be seen. Many offices offer virtual options to be seen via video if you are not comfortable going in person to a medical facility at this time. ? ?I am worried that you may be dealing with both a vaginal infection and a UTI at the same time. Most likely a bacterial vaginosis but giving pain with intercourse, it needs a further evaluation and testing to confirm and make sure nothing is missed. I recommend contacting your PCP Tracy Mcconnell) or being evaluated at one of our in-person urgent cares.  ? ?NOTE: You will NOT be charged for this eVisit. ? ?If you do not have a PCP, [facility] offers a free physician referral service available at [phone]. Our trained staff has the experience, knowledge and resources to put you in touch with a physician who is right for you.  ? ? ?If you are having a true medical emergency please call 911. ? ? ?Your e-visit answers were reviewed by a board certified advanced clinical practitioner to complete your personal care plan.  Thank you for using e-Visits. ? ?

## 2021-06-29 ENCOUNTER — Ambulatory Visit (INDEPENDENT_AMBULATORY_CARE_PROVIDER_SITE_OTHER): Payer: Medicare Other | Admitting: Neurology

## 2021-06-29 ENCOUNTER — Encounter: Payer: Self-pay | Admitting: Neurology

## 2021-06-29 VITALS — BP 93/61 | HR 85 | Ht 61.0 in | Wt 96.5 lb

## 2021-06-29 DIAGNOSIS — R200 Anesthesia of skin: Secondary | ICD-10-CM

## 2021-06-29 DIAGNOSIS — G43709 Chronic migraine without aura, not intractable, without status migrainosus: Secondary | ICD-10-CM

## 2021-06-29 DIAGNOSIS — G35 Multiple sclerosis: Secondary | ICD-10-CM

## 2021-06-29 DIAGNOSIS — H469 Unspecified optic neuritis: Secondary | ICD-10-CM

## 2021-06-29 IMAGING — MR MR HEAD WO/W CM
10 of 17 series · 25 of 48 positions shown · IV contrast (cc)
Comparison: Prior MRI from 02/12/2017.

CLINICAL DATA: Initial evaluation for suspected optic neuritis.

EXAM:
MRI HEAD AND ORBITS WITHOUT AND WITH CONTRAST
TECHNIQUE: Multiplanar, multiecho pulse sequences of the brain and surrounding
structures were obtained without and with intravenous contrast.
Multiplanar, multiecho pulse sequences of the orbits and surrounding
structures were obtained including fat saturation techniques, before
and after intravenous contrast administration.
CONTRAST:  6mL GADAVIST GADOBUTROL 1 MMOL/ML IV SOLN

[Series 2: FLAIR · sagittal · 5.0mm · 0.47mm/px · 1 of 25 slices shown (1 of 2)]
[im 1/25]
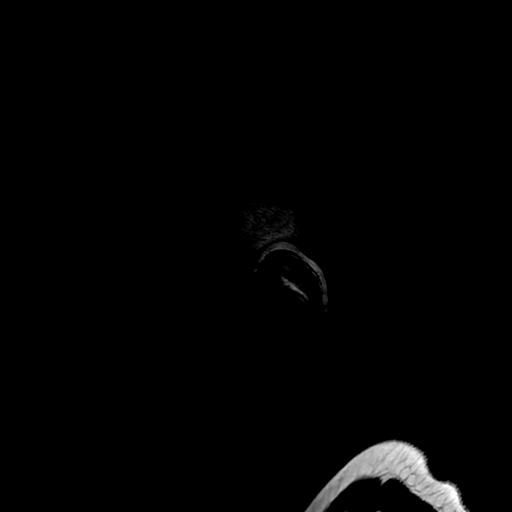

[Series 3: DWI · axial · 3.0mm · 0.94mm/px · z∈[-137,-5]mm · 7 of 100 slices shown]
[im 1/100]
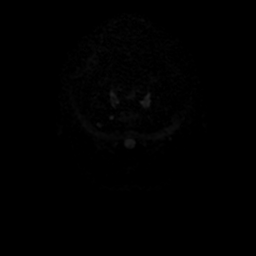
[im 17/100]
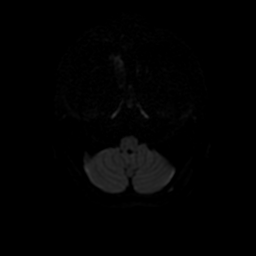
[im 34/100]
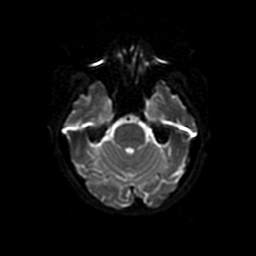
[im 50/100]
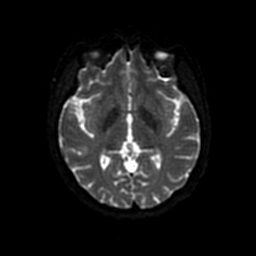
[im 67/100]
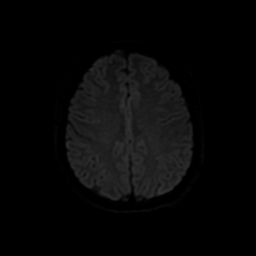
[im 83/100]
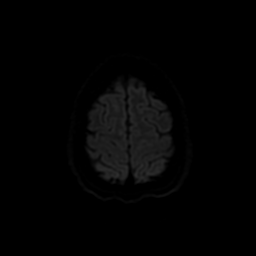
[im 100/100]
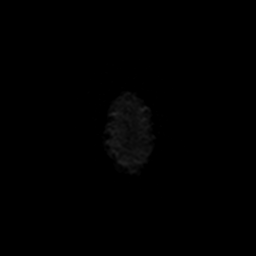

[Series 4: T2 · axial · 5.0mm · 0.47mm/px · z∈[-136,-7]mm · 2 of 25 slices shown]
[im 1/25]
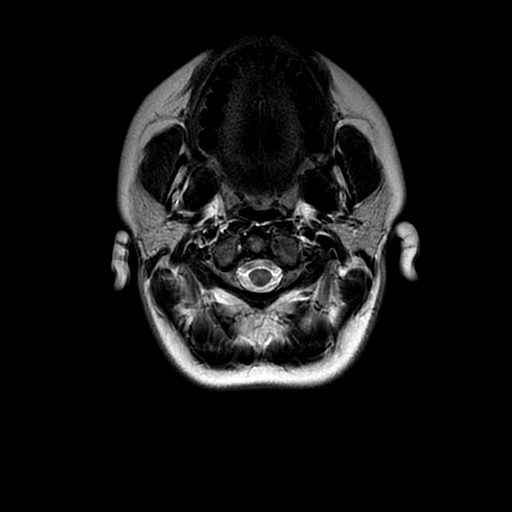
[im 25/25]
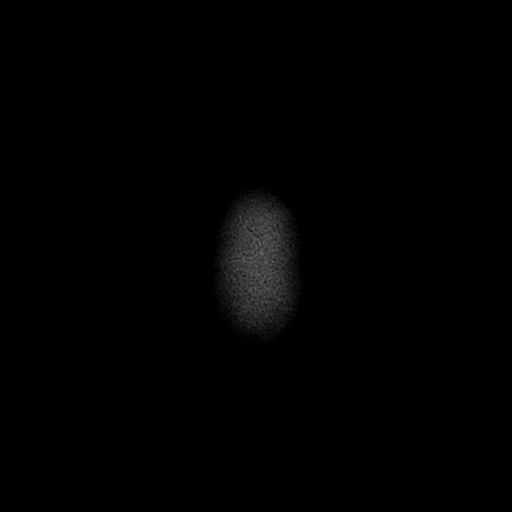

[Series 5: FLAIR · axial · 3.0mm · 0.41mm/px · z∈[-129,+0]mm · 2 of 25 slices shown (2 of 2)]
[im 1/25]
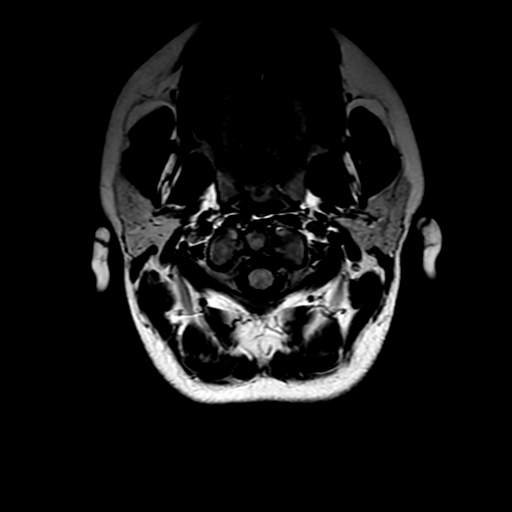
[im 25/25]
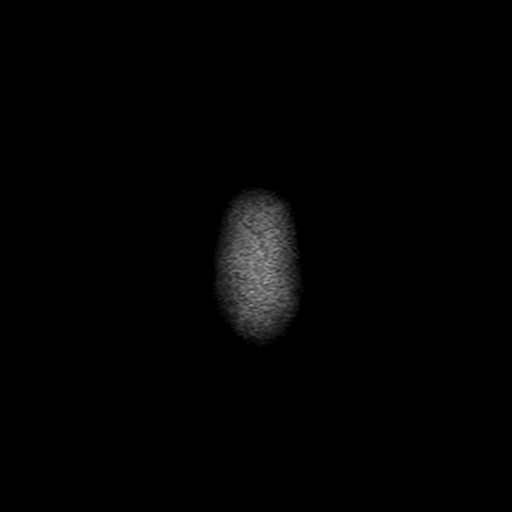

[Series 6: SWI · axial · 3.0mm · 0.50mm/px · z∈[-142,-98]mm · 3 of 100 slices shown]
[im 1/100]
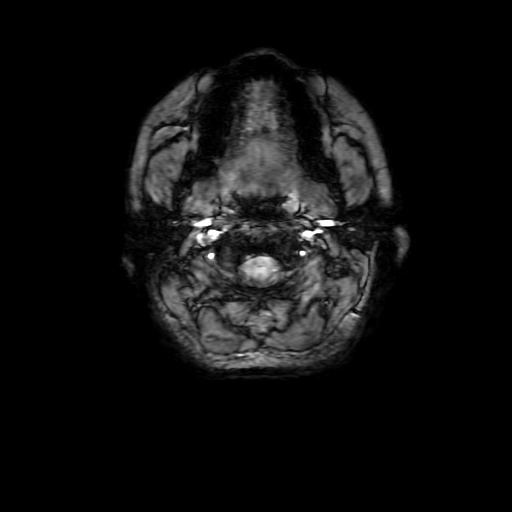
[im 17/100]
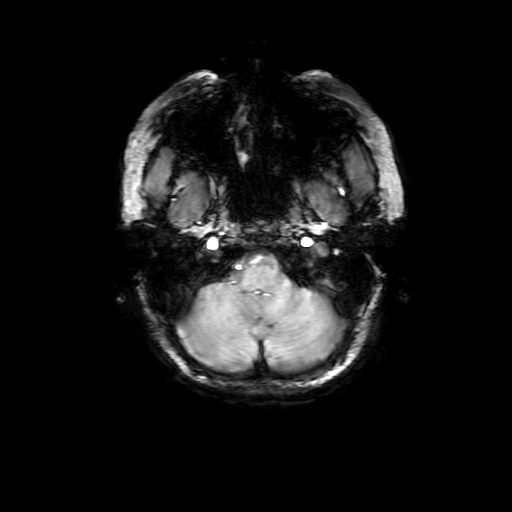
[im 34/100]
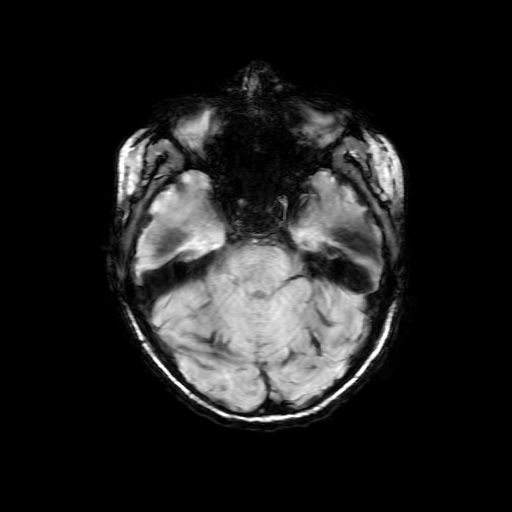

[Series 8: T2 fat-sat · coronal · 4.0mm · 0.35mm/px · 2 of 25 slices shown (1 of 2)]
[im 1/25]
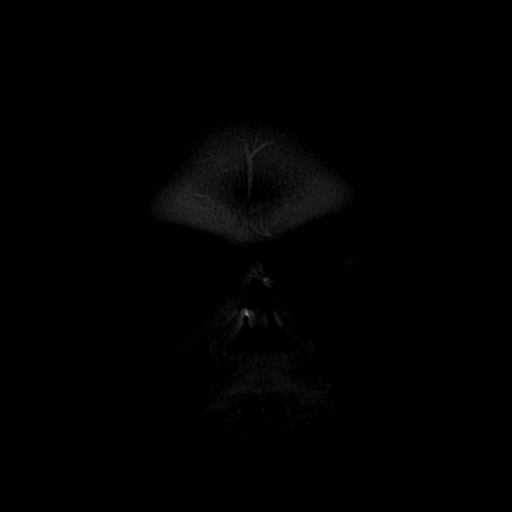
[im 25/25]
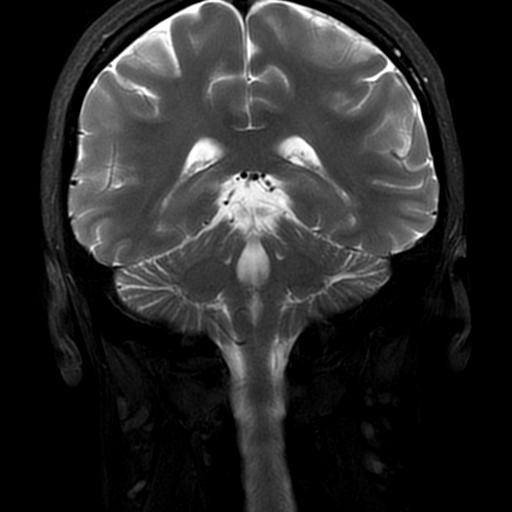

[Series 9: T2 fat-sat · axial · 3.0mm · 0.35mm/px · 1 of 20 slices shown (2 of 2)]
[im 1/20]
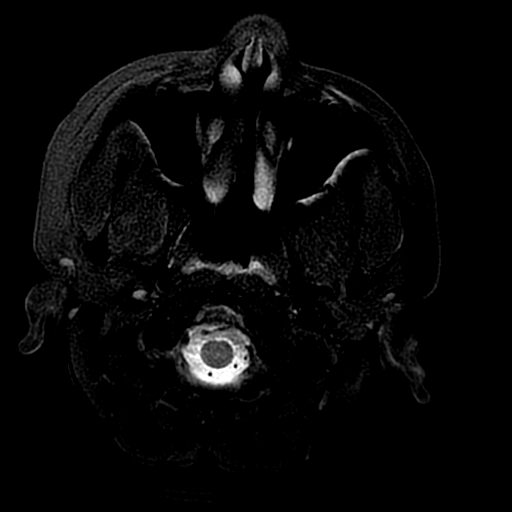

[Series 12: T2 post-contrast · coronal · 5.0mm · 0.39mm/px · 2 of 30 slices shown]
[im 1/30]
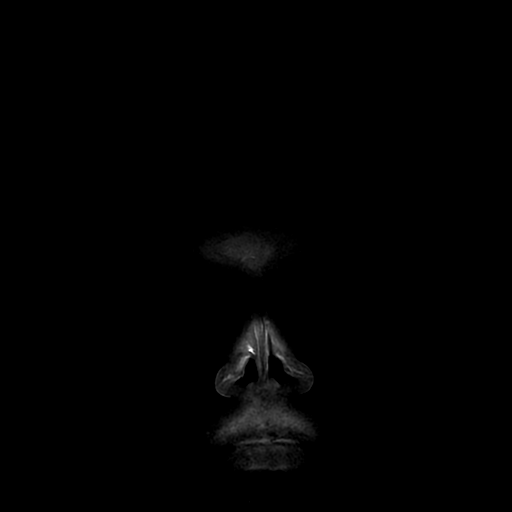
[im 30/30]
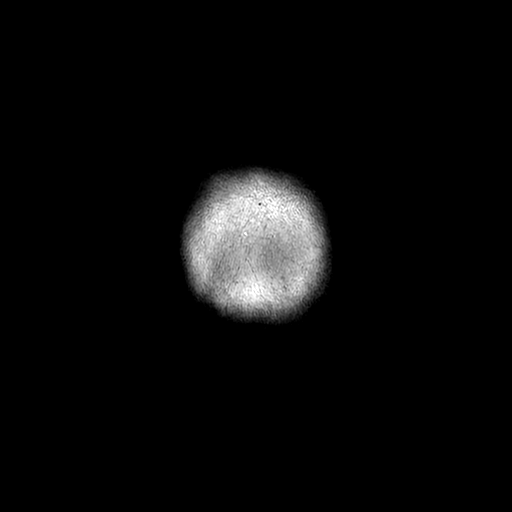

[Series 14: T1 post-contrast · coronal · 5.0mm · 0.39mm/px · 2 of 30 slices shown]
[im 1/30]
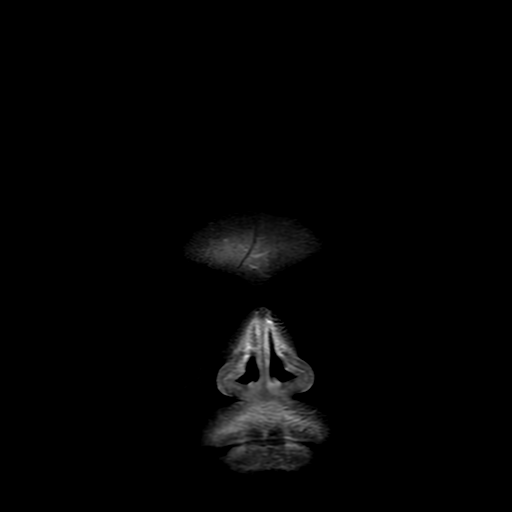
[im 30/30]
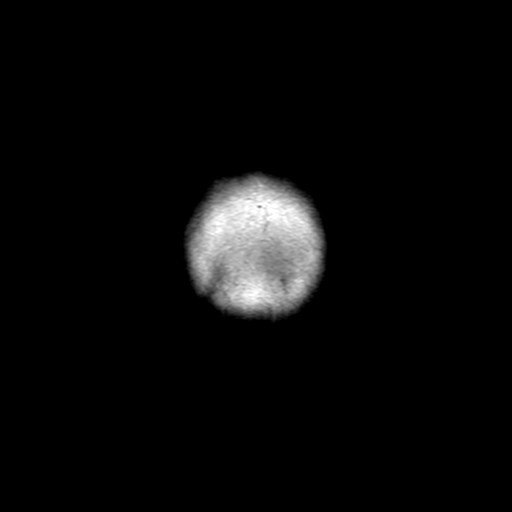

[Series 350: ADC · axial · 3.0mm · 0.94mm/px · z∈[-137,-5]mm · 3 of 50 slices shown]
[im 1/50]
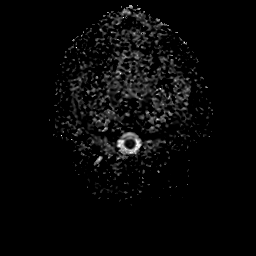
[im 25/50]
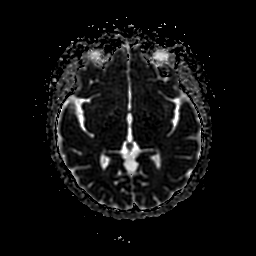
[im 50/50]
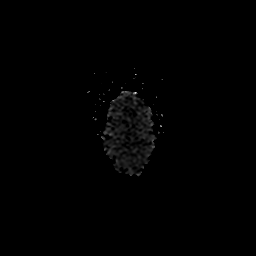

[25 of 48 positions shown; findings below may reference images not displayed]

FINDINGS: MRI HEAD FINDINGS

Brain: Cerebral volume within normal limits. Single 7 mm focus of
T2/FLAIR hyperintensities seen involving the periventricular white
matter adjacent to the frontal horn of the right lateral ventricle
(series 5, image 12), nonspecific. Additional subcentimeter juxta
cortical FLAIR hyperintensity noted at the right parietal lobe
(series 5, image 10). No other definite focal parenchymal signal
abnormality.

No evidence for acute or subacute infarct. Gray-white matter
differentiation maintained. No encephalomalacia to suggest chronic
cortical infarction. No evidence for acute or chronic intracranial
hemorrhage.

No mass lesion, midline shift or mass effect. No hydrocephalus. No
extra-axial fluid collection. Pituitary gland suprasellar region
normal. Midline structures intact. No abnormal enhancement.

Vascular: Major intracranial vascular flow voids are well
maintained.

Skull and upper cervical spine: Craniocervical junction within
normal limits. Bone marrow signal intensity normal. No scalp soft
tissue abnormality.

Other: No mastoid effusion.

MRI ORBITS FINDINGS

Orbits: Globes are symmetric in size with normal morphology and
appearance bilaterally. There is mild asymmetric enlargement and
irregularity about the left optic nerve with heterogeneous
postcontrast enhancement, suggesting acute optic neuritis (series
16, image 12). Involvement primarily consists of the intraorbital
portion of the nerve. Right optic nerve normal in appearance without
inflammation or enhancement. No abnormality about the orbital
apices. Optic chiasm normally situated within the suprasellar
cistern. Visualized optic radiations within normal limits.

Extra-ocular muscles symmetric and normal. Lacrimal glands normal.
Superior orbital veins within normal limits. No abnormality about
the cavernous sinus.

Visualized sinuses: Mild mucosal thickening noted within the
ethmoidal air cells in left maxillary sinus.

Soft tissues: Unremarkable.
IMPRESSION: 1. Irregularity with heterogeneous enhancement about the left optic
nerve, consistent with acute optic neuritis.
2. Few scattered subcentimeter FLAIR hyperintensities involving the
right periventricular and juxtacortical white matter as above. While
these findings are nonspecific, possible early and/or mild
demyelinating disease is considered given the acute optic neuritis.
No abnormal enhancement.
3. Otherwise normal MRI of the brain and orbits.

## 2021-06-29 NOTE — Progress Notes (Signed)
? ?[facility] ? ?PATIENT: Tracy Mcconnell ?DOB: [birth year] ? ?REFERRING DOCTOR OR PCP: PCP is Maximiano Coss ?SOURCE: Patient, notes from neurology and hospital, imaging and lab reports, MRI images personally reviewed ? ?_________________________________ ? ? ?HISTORICAL ? ?CHIEF COMPLAINT:  ?Chief Complaint  ?Patient presents with  ? Follow-up  ?  Rm 1, alone. Here for to f/u for her MS, on DF and tolerating well. Pt reports having a concussion 2 weeks ago. Has had urinary incontinence, L eye issues and numbness in L arm and shoulder area. Sx are improving.   ? ? ?HISTORY OF PRESENT ILLNESS:  ?Tracy Mcconnell is a 26 y.o. woman with left optic neuritis and abnormal brain MRi ? ?Update 06/29/2021 ?She had a concussion a couple weeks ago, hiting her head on the pullout freezer drawer.    There was no LOc but she had a headache x a few days afterwards.    She is better now.    ? ?She is on DMF and tolerates it well.  She has left > right leg weakness.    She has noted she is tripping more.  She notes some left arm > foot numbness with tingling but not that uncomfortable.  She will have a Lhermitte sign sometimes she flexes neck forward.     The 02/25/2021 MRI of the brain and cervical spine showed no acute or new findings.  The previous 2 MRIs did not show any new lesions.    ? ?She had mild visual changes with the concussion.  .   She had left ON and occasioanlly she notes left visula blurring.   COlors are desaturated OS.     ?  ?Migraines are doing fairly well on Botox.  She is on Ubrelvy when one occurs.   Nurtec works better than [place] but insurance will not cover it.    Bright lights or sunlight triggers a migraine.   Two headaches the past week had N/V.   ? ?  ?Multiple sclerosis/ History: ?In February 2021, she had the onset of left eye blurry vision and pain, worse with eye movements.    She saw urgent care and then ophthalmology   She was felt to have optic neuritis and  presented to the [facility] ED.   MRI of the brain and orbits showed enhancement of the left optic nerve,   There were a few T2 hyperintense foci in the hemispheres, including a periventricular focus in the right frontal lobe and one juxtacortical focus in the right parietal lobe.  None of the brain foci enhanced.  In retrospect, she has numbness and weakness in her legs that lasted a week, left > right in November 2019.   She had an LP 07/01/2019 and CSF showed no OCB and normal IgG Index  ? ?Imaging: ?MRI of the brain 10/15/2020 was unchanged ? ?MRI cervical spine 10/15/2020 was normal ? ?MRI of the brain 05/23/2020 showed scattered T2/FLAIR hyperintense foci including 2 in the periventricular white matter and 1 in the juxtacortical white matter.  None of the foci enhance.  Compared to the MRI of the brain from 12/24/2019 and 06/10/2019, there are no new lesions. ? ?MRI of the cervical spine 05/23/2020 showed a normal spinal cord.  No degenerative changes. ? ?MRI of the orbits 05/23/2020 showed no enhancing lesions.  There was slight hyperintensity of the left optic nerve consistent with the previous optic neuritis. ? ?Her maternal aunt and two second cousins have MS.   ? ?REVIEW  OF SYSTEMS: ?Constitutional: No fevers, chills, sweats, or change in appetite ?Eyes: No visual changes, double vision, eye pain ?Ear, nose and throat: No hearing loss, ear pain, nasal congestion, sore throat ?Cardiovascular: No chest pain, palpitations ?Respiratory:  No shortness of breath at rest or with exertion.   No wheezes ?GastrointestinaI: No nausea, vomiting, diarrhea, abdominal pain, fecal incontinence ?Genitourinary:  No dysuria, urinary retention or frequency.  No nocturia. ?Musculoskeletal:  No neck pain, back pain ?Integumentary: No rash, pruritus, skin lesions ?Neurological: as above ?Psychiatric: No depression at this time.  No anxiety ?Endocrine: No palpitations, diaphoresis, change in appetite, change in weigh or increased  thirst ?Hematologic/Lymphatic:  No anemia, purpura, petechiae. ?Allergic/Immunologic: No itchy/runny eyes, nasal congestion, recent allergic reactions, rashes ? ?ALLERGIES: ?Allergies  ?Allergen Reactions  ? Sumatriptan Shortness Of Breath and Other (See Comments)  ?  Entire body "felt heavy"  ? Other Itching, Swelling and Other (See Comments)  ?  Pesticides (family history)- Exposure face and mouth itch and the tongue/lips swell  ? ? ?HOME MEDICATIONS: ? ?Current Outpatient Medications:  ?  acetaminophen (TYLENOL) 325 MG tablet, Take 2 tablets (650 mg total) by mouth every 6 (six) hours as needed for mild pain (or temp > 100)., Disp: , Rfl:  ?  azithromycin (ZITHROMAX) 500 MG tablet, Take 1 tablet (500 mg total) by mouth daily., Disp: 3 tablet, Rfl: 0 ?  buPROPion (WELLBUTRIN XL) 300 MG 24 hr tablet, TAKE 1 TABLET BY MOUTH EVERY DAY IN THE MORNING (Patient taking differently: 300 mg daily.), Disp: 90 tablet, Rfl: 1 ?  busPIRone (BUSPAR) 30 MG tablet, TAKE 1 TABLET BY MOUTH 2 TIMES DAILY. (Patient taking differently: Take 30 mg by mouth in the morning and at bedtime.), Disp: 60 tablet, Rfl: 1 ?  clonazePAM (KLONOPIN) 0.5 MG tablet, TAKE 1 TABLET BY MOUTH 2 TIMES DAILY AS NEEDED FOR ANXIETY. (Patient taking differently: Take 0.5 mg by mouth 2 (two) times daily as needed for anxiety.), Disp: 60 tablet, Rfl: 0 ?  cyclobenzaprine (FLEXERIL) 5 MG tablet, TAKE 1 TABLET BY MOUTH THREE TIMES A DAY AS NEEDED FOR MUSCLE SPASMS (Patient taking differently: Take 5 mg by mouth 3 (three) times daily as needed for muscle spasms.), Disp: 30 tablet, Rfl: 1 ?  Dimethyl Fumarate (TECFIDERA) 240 MG CPDR, Take 1 capsule (240 mg total) by mouth in the morning and at bedtime., Disp: 180 capsule, Rfl: 3 ?  doxycycline (VIBRA-TABS) 100 MG tablet, Take 1 tablet (100 mg total) by mouth 2 (two) times daily., Disp: 14 tablet, Rfl: 0 ?  FLUoxetine (PROZAC) 40 MG capsule, TAKE 2 CAPSULES (80 MG TOTAL) BY MOUTH IN THE MORNING., Disp: 30 capsule,  Rfl: 1 ?  gabapentin (NEURONTIN) 400 MG capsule, Take 1 capsule (400 mg total) by mouth 4 (four) times daily -  with meals and at bedtime., Disp: 120 capsule, Rfl: 0 ?  gabapentin (NEURONTIN) 600 MG tablet, Take 600 mg by mouth 4 (four) times daily., Disp: , Rfl:  ?  hydrOXYzine (ATARAX/VISTARIL) 25 MG tablet, Take 1 tablet (25 mg total) by mouth 3 (three) times daily as needed for anxiety., Disp: 30 tablet, Rfl: 1 ?  indomethacin (INDOCIN) 25 MG capsule, TAKE 1 CAPSULE (25 MG TOTAL) BY MOUTH DAILY AS NEEDED. (Patient taking differently: Take 25 mg by mouth daily as needed for mild pain.), Disp: 30 capsule, Rfl: 2 ?  loperamide (IMODIUM) 2 MG capsule, Take 1 capsule (2 mg total) by mouth as needed for diarrhea or loose stools., Disp: 30  capsule, Rfl: 0 ?  naproxen (NAPROSYN) 500 MG tablet, Take 1 tablet (500 mg total) by mouth 2 (two) times daily with a meal., Disp: 60 tablet, Rfl: 1 ?  ondansetron (ZOFRAN ODT) 4 MG disintegrating tablet, Take 1 tablet (4 mg total) by mouth every 8 (eight) hours as needed for nausea or vomiting., Disp: 20 tablet, Rfl: 0 ?  ondansetron (ZOFRAN) 4 MG tablet, Take 1 tablet (4 mg total) by mouth every 8 (eight) hours as needed for nausea or vomiting., Disp: 20 tablet, Rfl: 0 ?  potassium chloride SA (KLOR-CON) 20 MEQ tablet, Take 1 tablet (20 mEq total) by mouth daily., Disp: 7 tablet, Rfl: 0 ?  QUEtiapine (SEROQUEL) 100 MG tablet, Take 1 tablet (100 mg total) by mouth at bedtime., Disp: 30 tablet, Rfl: 1 ?  Ubrogepant (UBRELVY) 100 MG TABS, Take 100 mg by mouth daily as needed. Take one tablet at onset of headache, may repeat 1 tablet in 2 hours, no more than 2 tablets in 24 hours, Disp: 10 tablet, Rfl: 11 ? ?PAST MEDICAL HISTORY: ?Past Medical History:  ?Diagnosis Date  ? Anemia   ? Anxiety   ? Depression   ? GERD (gastroesophageal reflux disease)   ? Hypovitaminosis D 04/07/2019  ? Left eye complaint 06/08/2019  ? Migraines   ? MS (multiple sclerosis) ([place])   ? ? ?PAST SURGICAL  HISTORY: ?Past Surgical History:  ?Procedure Laterality Date  ? CHOLECYSTECTOMY N/A 10/19/2019  ? Procedure: LAPAROSCOPIC CHOLECYSTECTOMY;  Surgeon: Clovis Riley, MD;  Location: [facility];  Service: General;  Late

## 2021-07-06 ENCOUNTER — Ambulatory Visit (INDEPENDENT_AMBULATORY_CARE_PROVIDER_SITE_OTHER): Payer: Medicare Other

## 2021-07-06 ENCOUNTER — Ambulatory Visit: Payer: Medicare Other

## 2021-07-06 ENCOUNTER — Ambulatory Visit
Admission: EM | Admit: 2021-07-06 | Discharge: 2021-07-06 | Disposition: A | Discharge status: Home / Self Care | Payer: Medicare Other | Attending: Emergency Medicine | Admitting: Emergency Medicine

## 2021-07-06 DIAGNOSIS — G35 Multiple sclerosis: Secondary | ICD-10-CM

## 2021-07-06 DIAGNOSIS — M533 Sacrococcygeal disorders, not elsewhere classified: Secondary | ICD-10-CM | POA: Diagnosis not present

## 2021-07-06 MED ORDER — METHYLPREDNISOLONE 4 MG PO TBPK: ORAL_TABLET | ORAL | 0 refills | Status: DC

## 2021-07-06 MED ORDER — GABAPENTIN 600 MG PO TABS: 600.0000 mg | ORAL_TABLET | Freq: Four times a day (QID) | ORAL | 0 refills | Status: DC

## 2021-07-06 NOTE — Discharge Instructions (Addendum)
The x-rays of your lumbar and sacral spine are unchanged from the images that were obtained in 2021.  At this time, believe that there is not a musculoskeletal cause for your pain.  I strongly recommend that you follow-up with your neurologist to have them further evaluate this pain from a neurological perspective. ? ?If this is actually a multiple sclerosis flareup, you would benefit from a short course of steroids.  I prescribed a methylprednisolone Dosepak for you.  Please take 1 row tablets daily with your breakfast meal starting tomorrow morning.  I typically do not recommend that patients begin steroids after noon due to their tendency to keep people awake at night. ? ?I know took the liberty of renewing your prescription for gabapentin to see if this may help relieve some of your discomfort, in your chart it does not look like it is been filled since November of last year.  I apologize that this is incorrect.  Your prescription should be ready to pick up in about an hour. ? ?In the past year, you have lost about 30 pounds give or take.  At this time you are about to be borderline underweight so please follow-up with your primary care provider to discuss strategies to help prevent further weight loss. ? ?Thank you for visiting urgent care today. ?

## 2021-07-06 NOTE — ED Triage Notes (Signed)
Pt c/o lower back pain (sacral region) that began a few days ago. ?

## 2021-07-06 NOTE — ED Provider Notes (Signed)
?[facility] ? ? ? ?CSN: [id number] ?Arrival date & time: 07/06/21  1125 ?  ? ?HISTORY  ? ?Chief Complaint  ?Patient presents with  ? Back Pain  ? ?HPI ?Tracy Mcconnell is a 26 y.o. female. Pt c/o lower back pain (sacral region) that began a few days ago.  Patient was seen 7 days ago by her neurologist for known multiple sclerosis, patient currently being treated with Tecfidera and, per neurologist note, is doing well on this treatment.  Patient states she also had an episode of sciatica bringing her to urgent care in 2021, patient states this pain is different.  Patient states she feels like her sacrum is broken and is now tilting outward into the left.  Patient also reports recent, significant unintended weight loss secondary to having COVID and losing her taste sensation. ? ?The history is provided by the patient.  ?Past Medical History:  ?Diagnosis Date  ? Anemia   ? Anxiety   ? Depression   ? GERD (gastroesophageal reflux disease)   ? Hypovitaminosis D 04/07/2019  ? Left eye complaint 06/08/2019  ? Migraines   ? MS (multiple sclerosis) (H[facility])   ? ?Patient Active Problem List  ? Diagnosis Date Noted  ? Multiple sclerosis (HCC) 10/19/2020  ? History of optic neuritis 10/19/2020  ? Insomnia 10/14/2020  ? 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 Severe episode of recurrent major depressive disorder, without psychotic features (HCC) 09/10/2020  ? Chronic migraine without aura without status migrainosus, not intractable 09/08/2020  ? Myalgia 09/08/2020  ? Acute appendicitis 01/17/2020  ? Abdominal pain 01/16/2020  ? Left lumbar radiculitis 07/24/2019  ? Vitamin D deficiency 07/15/2019  ? 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  ? Numbness 06/25/2019  ? Blurry vision   ? Generalized anxiety disorder   ? Optic neuritis 06/10/2019  ? Hypovitaminosis D 04/07/2019  ? Migraines 10/28/2018  ? Lumbar radiculopathy 10/28/2018  ? Chronic daily headache 02/04/2017  ? ?Past Surgical History:  ?Procedure  Laterality Date  ? CHOLECYSTECTOMY N/A 10/19/2019  ? Procedure: LAPAROSCOPIC CHOLECYSTECTOMY;  Surgeon: Berna Bue, MD;  Location: [facility];  Service: General;  Laterality: N/A;  ? LAPAROSCOPIC APPENDECTOMY N/A 01/17/2020  ? Procedure: APPENDECTOMY LAPAROSCOPIC;  Surgeon: Harriette Bouillon, MD;  Location: [facility];  Service: General;  Laterality: N/A;  ? ?OB History   ?No obstetric history on file. ?  ? ?Home Medications   ? ?Prior to Admission medications   ?Medication Sig Start Date End Date Taking? Authorizing Provider  ?acetaminophen (TYLENOL) 325 MG tablet Take 2 tablets (650 mg total) by mouth every 6 (six) hours as needed for mild pain (or temp > 100). 01/18/20   Meuth, Lina Sar, PA-C  ?Dimethyl Fumarate (TECFIDERA) 240 MG CPDR Take 1 capsule (240 mg total) by mouth in the morning and at bedtime. 01/31/21   Sater, Pearletha Furl, MD  ?FLUoxetine (PROZAC) 40 MG capsule TAKE 2 CAPSULES (80 MG TOTAL) BY MOUTH IN THE MORNING. 03/16/21   Nwoko, Tommas Olp, PA  ?gabapentin (NEURONTIN) 400 MG capsule Take 1 capsule (400 mg total) by mouth 4 (four) times daily -  with meals and at bedtime. 09/13/20   Laveda Abbe, NP  ?gabapentin (NEURONTIN) 600 MG tablet Take 600 mg by mouth 4 (four) times daily. 01/25/21   [provider]  ?hydrOXYzine (ATARAX/VISTARIL) 25 MG tablet Take 1 tablet (25 mg total) by mouth 3 (three) times daily as needed for anxiety. 12/07/20   Meta Hatchet, PA  ?  loperamide (IMODIUM) 2 MG capsule Take 1 capsule (2 mg total) by mouth as needed for diarrhea or loose stools. 05/13/21   Claiborne Rigg, NP  ?naproxen (NAPROSYN) 500 MG tablet Take 1 tablet (500 mg total) by mouth 2 (two) times daily with a meal. 05/06/21   Bennie Pierini, FNP  ?ondansetron (ZOFRAN ODT) 4 MG disintegrating tablet Take 1 tablet (4 mg total) by mouth every 8 (eight) hours as needed for nausea or vomiting. 06/21/21   Janeece Agee, NP  ?ondansetron (ZOFRAN) 4 MG tablet Take 1 tablet (4 mg total) by mouth every  8 (eight) hours as needed for nausea or vomiting. 05/13/21   Claiborne Rigg, NP  ?QUEtiapine (SEROQUEL) 100 MG tablet Take 1 tablet (100 mg total) by mouth at bedtime. 12/07/20   Meta Hatchet, PA  ?Ubrogepant (UBRELVY) 100 MG TABS Take 100 mg by mouth daily as needed. Take one tablet at onset of headache, may repeat 1 tablet in 2 hours, no more than 2 tablets in 24 hours 04/21/20   Lomax, Amy, NP  ?lamoTRIgine (LAMICTAL) 25 MG tablet Take 1 tablet at night for 5 days, then 1 in the morning and 1 tablet at night for 5 days, then 1 in the morning and 2 tablets at night for 5 days. Then you can go to 2 tablets in the morning and 2 tablets in the evening thereafter 06/06/20 09/10/20  Sater, Pearletha Furl, MD  ?omeprazole (PRILOSEC) 20 MG capsule Take 1 capsule (20 mg total) by mouth daily. 10/11/19 09/10/20  Junie Spencer, FNP  ? ? ?Family History ?Family History  ?Problem Relation Age of Onset  ? Migraines Mother   ? Thyroid disease Mother   ? Cancer Maternal Grandmother   ? Hypertension Maternal Grandfather   ? Rheum arthritis Paternal Grandmother   ? Diabetes Paternal Grandmother   ? Stroke Paternal Grandmother   ? Diabetes Paternal Grandfather   ? ?Social History ?Social History  ? ?Tobacco Use  ? Smoking status: Never  ? Smokeless tobacco: Never  ?Vaping Use  ? Vaping Use: Never used  ?Substance Use Topics  ? Alcohol use: Not Currently  ? Drug use: No  ? ?Allergies   ?Sumatriptan and Other ? ?Review of Systems ?Review of Systems ?Pertinent findings noted in history of present illness.  ? ?Physical Exam ?Triage Vital Signs ?ED Triage Vitals  ?Enc Vitals Group  ?   BP 01/27/21 0827 (!) 147/82  ?   Pulse Rate 01/27/21 0827 72  ?   Resp 01/27/21 0827 18  ?   Temp 01/27/21 0827 98.3 ?F (36.8 ?C)  ?   Temp Source 01/27/21 0827 Oral  ?   SpO2 01/27/21 0827 98 %  ?   Weight --   ?   Height --   ?   Head Circumference --   ?   Peak Flow --   ?   Pain Score 01/27/21 0826 5  ?   Pain Loc --   ?   Pain Edu? --   ?   Excl. in GC?  --   ?No data found. ? ?Updated Vital Signs ?BP 93/67 (BP Location: Right Arm)   Pulse 89   Temp 98.7 ?F (37.1 ?C) (Oral)   Resp 18   LMP 06/13/2021   SpO2 98%  ? ?Physical Exam ?Vitals and nursing note reviewed.  ?Constitutional:   ?   General: She is not in acute distress. ?   Appearance: Normal appearance. She is  not ill-appearing.  ?HENT:  ?   Head: Normocephalic and atraumatic.  ?Eyes:  ?   General: Lids are normal.     ?   Right eye: No discharge.     ?   Left eye: No discharge.  ?   Extraocular Movements: Extraocular movements intact.  ?   Conjunctiva/sclera: Conjunctivae normal.  ?   Right eye: Right conjunctiva is not injected.  ?   Left eye: Left conjunctiva is not injected.  ?Neck:  ?   Trachea: Trachea and phonation normal.  ?Cardiovascular:  ?   Rate and Rhythm: Normal rate and regular rhythm.  ?   Pulses: Normal pulses.  ?   Heart sounds: Normal heart sounds. No murmur heard. ?  No friction rub. No gallop.  ?Pulmonary:  ?   Effort: Pulmonary effort is normal. No accessory muscle usage, prolonged expiration or respiratory distress.  ?   Breath sounds: Normal breath sounds. No stridor, decreased air movement or transmitted upper airway sounds. No decreased breath sounds, wheezing, rhonchi or rales.  ?Chest:  ?   Chest wall: No tenderness.  ?Musculoskeletal:     ?   General: No swelling, tenderness, deformity or signs of injury. Normal range of motion.  ?   Cervical back: Normal range of motion and neck supple. Normal range of motion.  ?Lymphadenopathy:  ?   Cervical: No cervical adenopathy.  ?Skin: ?   General: Skin is warm and dry.  ?   Findings: No erythema or rash.  ?Neurological:  ?   General: No focal deficit present.  ?   Mental Status: She is alert and oriented to person, place, and time.  ?Psychiatric:     ?   Mood and Affect: Mood normal.     ?   Behavior: Behavior normal.  ? ? ?Visual Acuity ?Right Eye Distance:   ?Left Eye Distance:   ?Bilateral Distance:   ? ?Right Eye Near:   ?Left Eye  Near:    ?Bilateral Near:    ? ?[facility] Couse / Diagnostics / Procedures:  ?  ?EKG ? ?Radiology ?No results found. ? ?Procedures ?Procedures (including critical care time) ? ?[facility] Diagnoses / Final Clinical Impress

## 2021-08-02 ENCOUNTER — Ambulatory Visit (INDEPENDENT_AMBULATORY_CARE_PROVIDER_SITE_OTHER): Payer: Medicare Other | Admitting: Family Medicine

## 2021-08-02 DIAGNOSIS — G43709 Chronic migraine without aura, not intractable, without status migrainosus: Secondary | ICD-10-CM

## 2021-08-02 NOTE — Progress Notes (Signed)
Botox- 100 units x 2 vials ?Lot: [id number] ?Expiration: 08/2023 ?NDC: 0023-1145-01 ? ?Bacteriostatic 0.9% Sodium Chloride- 4mL total ?Lot: [id number] ?Expiration: 11/01/2022 ?NDC: 0409-1966-02 ? ?Dx: G43.709 ?B/B  ?

## 2021-08-02 NOTE — Progress Notes (Signed)
? ?08/02/2021 ALL: [name] returns for Botox. She continues to do well. Last seen by Dr Epimenio Foot 05/2021. MS stable on Tecfidera.  ? ?05/09/2021 ALL: She returns for Botox. She feels migraines are well managed. She may have 7-8 milder headaches per month but onlu 1-2 migraines. [name] helps with abortive therapy. No new MS symptoms. She continues Tecfidera. She missed her follow up with Dr Epimenio Foot 04/19/21.  ? ?02/14/2021 ALL: She continues to do well on Botox. [name] helps with migraine abortion. She feels migraines are better She has about 10 headache days with 3 severe headaches monthly. She continues tecfidera. She has follow up with [facility] in January. She denies new or worsening symptoms.  ? ?11/03/2020: She continues to do well with Botox therapy. Migraines are well managed. Nurtec seems to work best for abortive therapy but not covered. She is using Ubrelvy that helps some. She was started on Tecfidera 09/2020 following concerns of 1 new focus on brain MRI. She is tolerating well and has follow up with Dr Epimenio Foot 04/2021.  ? ?08/02/2020 ALL: She continues to note benefit of Botox in migraine management. She continues Ubrelvy for abortive therapy. She continues follow up with Dr Epimenio Foot. Reports bilateral face, upper and lower extremity numbness on two separate occasions. No changes in MRI last performed 05/2020. She has follow up with Dr Epimenio Foot in 12/2020.  ? ?04/21/2020 ALL: She returns today for fourth Botox procedure. She tolerated last procedure well [name]). She does feel Botox is helping migraines. She does continue to have about 10 migraine days per month. She has failed sumatriptan and rizatriptan. She is taking gabapentin and indomethacin for back pain. Nurtec works well but not covered by [employer]. She has not tried [place]. 1 Nurtec sample and 2 Ubrelvy 50mg  samples given in office today.  ? ?She did have optic neuritis in March. MRI concerning for foci. LP performed and CSF normal. She is being followed by Dr  April every 6 months with repeat imaging planned. She reports vision loss of left eye resolved. She has had some blurred/double vision bilaterally and is followed by Dr Epimenio Foot.  ? ? ?Consent Form ?Botulism Toxin Injection For Chronic Migraine ? ? ? ?Reviewed orally with patient, additionally signature is on file: ? ?Botulism toxin has been approved by the Federal drug administration for treatment of chronic migraine. Botulism toxin does not cure chronic migraine and it may not be effective in some patients. ? ?The administration of botulism toxin is accomplished by injecting a small amount of toxin into the muscles of the neck and head. Dosage must be titrated for each individual. Any benefits resulting from botulism toxin tend to wear off after 3 months with a repeat injection required if benefit is to be maintained. Injections are usually done every 3-4 months with maximum effect peak achieved by about 2 or 3 weeks. Botulism toxin is expensive and you should be sure of what costs you will incur resulting from the injection. ? ?The side effects of botulism toxin use for chronic migraine may include: ? ? -Transient, and usually mild, facial weakness with facial injections ? -Transient, and usually mild, head or neck weakness with head/neck injections ? -Reduction or loss of forehead facial animation due to forehead muscle weakness ? -Eyelid drooping ? -Dry eye ? -Pain at the site of injection or bruising at the site of injection ? -Double vision ? -Potential unknown long term risks ? ? ?Contraindications: You should not have Botox if you are pregnant, nursing,  allergic to albumin, have an infection, skin condition, or muscle weakness at the site of the injection, or have myasthenia gravis, Lambert-Eaton syndrome, or ALS. ? ?It is also possible that as with any injection, there may be an allergic reaction or no effect from the medication. Reduced effectiveness after repeated injections is sometimes seen and rarely  infection at the injection site may occur. All care will be taken to prevent these side effects. If therapy is given over a long time, atrophy and wasting in the muscle injected may occur. Occasionally the patient's become refractory to treatment because they develop antibodies to the toxin. In this event, therapy needs to be modified. ? ?I have read the above information and consent to the administration of botulism toxin. ? ? ? ?BOTOX PROCEDURE NOTE FOR MIGRAINE HEADACHE ? ?Contraindications and precautions discussed with patient(above). Aseptic procedure was observed and patient tolerated procedure. Procedure performed by Shawnie Dapper, FNP-C.  ? ?The condition has existed for more than 6 months, and pt does not have a diagnosis of ALS, Myasthenia Gravis or Lambert-Eaton Syndrome.  Risks and benefits of injections discussed and pt agrees to proceed with the procedure.  Written consent obtained ? ?These injections are medically necessary. Pt  receives good benefits from these injections. These injections do not cause sedations or hallucinations which the oral therapies may cause. ? ? ?Description of procedure: ? ?The patient was placed in a sitting position. The standard protocol was used for Botox as follows, with 5 units of Botox injected at each site: ? ?-Procerus muscle, midline injection ? ?-Corrugator muscle, bilateral injection ? ?-Frontalis muscle, bilateral injection, with 2 sites each side, medial injection was performed in the upper one third of the frontalis muscle, in the region vertical from the medial inferior edge of the superior orbital rim. The lateral injection was again in the upper one third of the forehead vertically above the lateral limbus of the cornea, 1.5 cm lateral to the medial injection site. ? ?-Temporalis muscle injection, 4 sites, bilaterally. The first injection was 3 cm above the tragus of the ear, second injection site was 1.5 cm to 3 cm up from the first injection site in line with  the tragus of the ear. The third injection site was 1.5-3 cm forward between the first 2 injection sites. The fourth injection site was 1.5 cm posterior to the second injection site. 5th site laterally in the temporalis  muscleat the level of the outer canthus. ? ?-Occipitalis muscle injection, 3 sites, bilaterally. The first injection was done one half way between the occipital protuberance and the tip of the mastoid process behind the ear. The second injection site was done lateral and superior to the first, 1 fingerbreadth from the first injection. The third injection site was 1 fingerbreadth superiorly and medially from the first injection site. ? ?-Cervical paraspinal muscle injection, 2 sites, bilaterally. The first injection site was 1 cm from the midline of the cervical spine, 3 cm inferior to the lower border of the occipital protuberance. The second injection site was 1.5 cm superiorly and laterally to the first injection site. ? ?-Trapezius muscle injection was performed at 3 sites, bilaterally. The first injection site was in the upper trapezius muscle halfway between the inflection point of the neck, and the acromion. The second injection site was one half way between the acromion and the first injection site. The third injection was done between the first injection site and the inflection point of the neck. ? ? ?Will  return for repeat injection in 3 months. ? ? ?A total of 200 units of Botox was prepared, 155 units of Botox was injected as documented above, any Botox not injected was wasted. The patient tolerated the procedure well, there were no complications of the above procedure. ? ? ?

## 2021-08-18 ENCOUNTER — Telehealth: Payer: Self-pay

## 2021-08-18 NOTE — Telephone Encounter (Signed)
I called and spoke with patient to see how she was doing. Patient states she is still having the same  symptoms not being able to swallow anything without water and actually keep it down. I offered patient a appointment and she declined states she will be okay until her appointment on the 24th

## 2021-08-23 ENCOUNTER — Ambulatory Visit (INDEPENDENT_AMBULATORY_CARE_PROVIDER_SITE_OTHER): Payer: Medicare Other | Admitting: Registered Nurse

## 2021-08-23 ENCOUNTER — Encounter: Payer: Self-pay | Admitting: Registered Nurse

## 2021-08-23 VITALS — BP 107/73 | HR 87 | Temp 98.3°F | Resp 18 | Ht 61.0 in | Wt 92.2 lb

## 2021-08-23 DIAGNOSIS — G35 Multiple sclerosis: Secondary | ICD-10-CM

## 2021-08-23 DIAGNOSIS — R1114 Bilious vomiting: Secondary | ICD-10-CM

## 2021-08-23 DIAGNOSIS — R1310 Dysphagia, unspecified: Secondary | ICD-10-CM

## 2021-08-23 LAB — COMPREHENSIVE METABOLIC PANEL
ALT: 10 U/L (ref 0–35)
AST: 14 U/L (ref 0–37)
Albumin: 4.5 g/dL (ref 3.5–5.2)
Alkaline Phosphatase: 68 U/L (ref 39–117)
BUN: 8 mg/dL (ref 6–23)
CO2: 29 mEq/L (ref 19–32)
Calcium: 9.9 mg/dL (ref 8.4–10.5)
Chloride: 101 mEq/L (ref 96–112)
Creatinine, Ser: 0.62 mg/dL (ref 0.40–1.20)
GFR: 122.97 mL/min (ref >60.00)
Glucose, Bld: 86 mg/dL (ref 70–99)
Potassium: 3.5 mEq/L (ref 3.5–5.1)
Sodium: 139 mEq/L (ref 135–145)
Total Bilirubin: 0.4 mg/dL (ref 0.2–1.2)
Total Protein: 7.1 g/dL (ref 6.0–8.3)

## 2021-08-23 LAB — CBC WITH DIFFERENTIAL/PLATELET
Basophils Absolute: 0.1 10*3/uL (ref 0.0–0.1)
Basophils Relative: 1.2 % (ref 0.0–3.0)
Eosinophils Absolute: 0.1 10*3/uL (ref 0.0–0.7)
Eosinophils Relative: 1.6 % (ref 0.0–5.0)
HCT: 41.4 % (ref 36.0–46.0)
Hemoglobin: 13.9 g/dL (ref 12.0–15.0)
Lymphocytes Relative: 29 % (ref 12.0–46.0)
Lymphs Abs: 2.5 10*3/uL (ref 0.7–4.0)
MCHC: 33.4 g/dL (ref 30.0–36.0)
MCV: 88.8 fl (ref 78.0–100.0)
Monocytes Absolute: 0.7 10*3/uL (ref 0.1–1.0)
Monocytes Relative: 8.5 % (ref 3.0–12.0)
Neutro Abs: 5.1 10*3/uL (ref 1.4–7.7)
Neutrophils Relative %: 59.7 % (ref 43.0–77.0)
Platelets: 387 10*3/uL (ref 150.0–400.0)
RBC: 4.67 Mil/uL (ref 3.87–5.11)
RDW: 13.3 % (ref 11.5–15.5)
WBC: 8.5 10*3/uL (ref 4.0–10.5)

## 2021-08-23 LAB — TSH: TSH: 0.88 u[IU]/mL (ref 0.35–5.50)

## 2021-08-23 MED ORDER — ONDANSETRON 4 MG PO TBDP: 4.0000 mg | ORAL_TABLET | Freq: Three times a day (TID) | ORAL | 0 refills | Status: AC | PRN

## 2021-08-23 MED ORDER — SUCRALFATE 1 GM/10ML PO SUSP: 1.0000 g | Freq: Three times a day (TID) | ORAL | 0 refills | Status: AC

## 2021-08-23 MED ORDER — PANTOPRAZOLE SODIUM 40 MG PO TBEC: 40.0000 mg | DELAYED_RELEASE_TABLET | Freq: Every day | ORAL | 3 refills | Status: AC

## 2021-08-23 NOTE — Telephone Encounter (Signed)
Noted. Will see her today.  Thanks,  Luan Pulling

## 2021-08-23 NOTE — Patient Instructions (Signed)
Tracy Mcconnell -   Randie Heinz to see you.  Sorry this is going on  Let's get a barium swallow study to ensure no neuro contributors.  Start pantoprazole daily in the mornings with water. Can take sucralfate four times daily - before each meal and before bed.  I'll run things by Dr. Epimenio Foot to get his input - we'll see if he thinks there's anything else we should do.  Thanks,  Luan Pulling

## 2021-08-23 NOTE — Progress Notes (Signed)
Established Patient Office Visit  Subjective:  Patient ID: Tracy Mcconnell, female    DOB: 28-Sep-1995  Age: 26 y.o. MRN: [id number]  CC:  Chief Complaint  Patient presents with   Nausea    Patient states she has been having some nausea and vomiting for about a month. Patient states her throat will not let food go down without having water in her mouth.    HPI Hanifa J Ramsey-Peralta presents for nausea,  Onset one month ago Can't get food down - feels like she is having more saliva, but perhaps secondary to difficulty swallowing. Easier with softer foods and clear liquids like jello and mashed potatoes.  Has had vomiting as well - this happens after getting food all the way down. Has been occurring every other day.   No diarrhea, constipation, blood or mucus in stool.   Denies new headaches, visual changes, dizziness, LOC, shob.   Hx complicated by optic neuritis, MS, s/p cholecystectomy, s/p appendectomy  Outpatient Medications Prior to Visit  Medication Sig Dispense Refill   acetaminophen (TYLENOL) 325 MG tablet Take 2 tablets (650 mg total) by mouth every 6 (six) hours as needed for mild pain (or temp > 100).     Dimethyl Fumarate (TECFIDERA) 240 MG CPDR Take 1 capsule (240 mg total) by mouth in the morning and at bedtime. 180 capsule 3   FLUoxetine (PROZAC) 40 MG capsule TAKE 2 CAPSULES (80 MG TOTAL) BY MOUTH IN THE MORNING. 30 capsule 1   hydrOXYzine (ATARAX/VISTARIL) 25 MG tablet Take 1 tablet (25 mg total) by mouth 3 (three) times daily as needed for anxiety. 30 tablet 1   loperamide (IMODIUM) 2 MG capsule Take 1 capsule (2 mg total) by mouth as needed for diarrhea or loose stools. 30 capsule 0   methylPREDNISolone (MEDROL DOSEPAK) 4 MG TBPK tablet Take 24 mg on day 1, 20 mg on day 2, 16 mg on day 3, 12 mg on day 4, 8 mg on day 5, 4 mg on day 6. 21 tablet 0   naproxen (NAPROSYN) 500 MG tablet Take 1 tablet (500 mg total) by mouth 2 (two) times daily with a meal.  60 tablet 1   ondansetron (ZOFRAN) 4 MG tablet Take 1 tablet (4 mg total) by mouth every 8 (eight) hours as needed for nausea or vomiting. 20 tablet 0   QUEtiapine (SEROQUEL) 100 MG tablet Take 1 tablet (100 mg total) by mouth at bedtime. 30 tablet 1   Ubrogepant (UBRELVY) 100 MG TABS Take 100 mg by mouth daily as needed. Take one tablet at onset of headache, may repeat 1 tablet in 2 hours, no more than 2 tablets in 24 hours 10 tablet 11   ondansetron (ZOFRAN ODT) 4 MG disintegrating tablet Take 1 tablet (4 mg total) by mouth every 8 (eight) hours as needed for nausea or vomiting. 20 tablet 0   gabapentin (NEURONTIN) 600 MG tablet Take 1 tablet (600 mg total) by mouth 4 (four) times daily. 120 tablet 0   No facility-administered medications prior to visit.    Review of Systems Per hpi     Objective:     BP 107/73   Pulse 87   Temp 98.3 F (36.8 C) (Temporal)   Resp 18   Ht 5\' 1"  (1.549 m)   Wt 92 lb 3.2 oz (41.8 kg)   SpO2 100%   BMI 17.42 kg/m   Wt Readings from Last 3 Encounters:  08/23/21 92 lb 3.2 oz (41.8 kg)  06/29/21 96 lb 8 oz (43.8 kg)  06/21/21 101 lb (45.8 kg)   Physical Exam Vitals and nursing note reviewed.  Constitutional:      General: She is not in acute distress.    Appearance: Normal appearance. She is normal weight. She is not ill-appearing, toxic-appearing or diaphoretic.  Cardiovascular:     Rate and Rhythm: Normal rate and regular rhythm.     Heart sounds: Normal heart sounds. No murmur heard.   No friction rub. No gallop.  Pulmonary:     Effort: Pulmonary effort is normal. No respiratory distress.     Breath sounds: Normal breath sounds. No stridor. No wheezing, rhonchi or rales.  Chest:     Chest wall: No tenderness.  Abdominal:     General: Abdomen is flat. Bowel sounds are normal.  Skin:    General: Skin is warm and dry.  Neurological:     General: No focal deficit present.     Mental Status: She is alert and oriented to person, place,  and time. Mental status is at baseline.  Psychiatric:        Mood and Affect: Mood normal.        Behavior: Behavior normal.        Thought Content: Thought content normal.        Judgment: Judgment normal.    No results found for any visits on 08/23/21.    The ASCVD Risk score (Arnett DK, et al., 2019) failed to calculate for the following reasons:   The 2019 ASCVD risk score is only valid for ages 36 to 59    Assessment & Plan:   Problem List Items Addressed This Visit       Nervous and Auditory   Multiple sclerosis (HCC)   Relevant Orders   DG SWALLOW STUDY OP MEDICARE SPEECH PATH   CBC with Differential/Platelet   TSH   Comprehensive metabolic panel   Other Visit Diagnoses     Dysphagia, unspecified type    -  Primary   Relevant Medications   pantoprazole (PROTONIX) 40 MG tablet   sucralfate (CARAFATE) 1 GM/10ML suspension   Other Relevant Orders   DG SWALLOW STUDY OP MEDICARE SPEECH PATH   CBC with Differential/Platelet   TSH   Comprehensive metabolic panel   Bilious vomiting with nausea       Relevant Medications   ondansetron (ZOFRAN ODT) 4 MG disintegrating tablet   pantoprazole (PROTONIX) 40 MG tablet   sucralfate (CARAFATE) 1 GM/10ML suspension   Other Relevant Orders   CBC with Differential/Platelet   TSH   Comprehensive metabolic panel       Meds ordered this encounter  Medications   ondansetron (ZOFRAN ODT) 4 MG disintegrating tablet    Sig: Take 1 tablet (4 mg total) by mouth every 8 (eight) hours as needed for nausea or vomiting.    Dispense:  20 tablet    Refill:  0    Order Specific Question:   Supervising Provider    Answer:   Neva Seat, JEFFREY R [[id number]]   pantoprazole (PROTONIX) 40 MG tablet    Sig: Take 1 tablet (40 mg total) by mouth daily.    Dispense:  30 tablet    Refill:  3    Order Specific Question:   Supervising Provider    Answer:   Neva Seat, JEFFREY R [[id number]]   sucralfate (CARAFATE) 1 GM/10ML suspension    Sig: Take 10 mLs (1  g total) by mouth 4 (four) times daily -  with meals and at bedtime.    Dispense:  420 mL    Refill:  0    Order Specific Question:   Supervising Provider    Answer:   Neva Seat, JEFFREY R [[id number]]    Return if symptoms worsen or fail to improve.   PLAN Unclear if related to MS, GERD, or other issue Will start on PPI and sucralfate. Order swallow study as she has not had one previously.  Will CC Neurology Dr. Epimenio Foot to seek his input Plan discussed with patient who voices understanding Patient encouraged to call clinic with any questions, comments, or concerns.   Janeece Agee, NP

## 2021-08-24 NOTE — Addendum Note (Signed)
Addended by: Janeece Agee on: 08/24/2021 07:11 AM   Modules accepted: Orders

## 2021-08-25 ENCOUNTER — Other Ambulatory Visit (HOSPITAL_COMMUNITY): Payer: Self-pay

## 2021-08-25 ENCOUNTER — Telehealth (HOSPITAL_COMMUNITY): Payer: Self-pay

## 2021-08-25 DIAGNOSIS — R059 Cough, unspecified: Secondary | ICD-10-CM

## 2021-08-25 DIAGNOSIS — R1310 Dysphagia, unspecified: Secondary | ICD-10-CM

## 2021-08-25 NOTE — Telephone Encounter (Signed)
Attempted to contact patient to schedule OP MBS - left voicemail. ?

## 2021-08-26 ENCOUNTER — Telehealth: Payer: Medicare Other | Admitting: Nurse Practitioner

## 2021-08-26 DIAGNOSIS — Z202 Contact with and (suspected) exposure to infections with a predominantly sexual mode of transmission: Secondary | ICD-10-CM | POA: Diagnosis not present

## 2021-08-26 NOTE — Progress Notes (Signed)
E-Visit for Urinary Problems  We are sorry that you are not feeling well.  Here is how we plan to help!  Based on what you shared with me it looks like you most likely have chlamydia  Chlamydia is a common STD that can cause infection among both men and women. It can cause permanent damage to a woman's reproductive system. This can make it difficult or impossible to get pregnant later. Chlamydia can also cause a potentially fatal ectopic pregnancy (pregnancy that occurs outside the womb).  I have prescribed doxycycline 100mg  1 po bid for 7 days. Make sure that you take all of the  medication as prescribed No sexual intercourse until treatment is completed.  HOME CARE Drink plenty of fluids Compete the full course of the antibiotics even if the symptoms resolve Remember, when you need to go.go. Holding in your urine can increase the likelihood of getting a UTI! GET HELP RIGHT AWAY IF: You cannot urinate You get a high fever Worsening back pain occurs You see blood in your urine You feel sick to your stomach or throw up You feel like you are going to pass out  MAKE SURE YOU  Understand these instructions. Will watch your condition. Will get help right away if you are not doing well or get worse.   Thank you for choosing an e-visit.  Your e-visit answers were reviewed by a board certified advanced clinical practitioner to complete your personal care plan. Depending upon the condition, your plan could have included both over the counter or prescription medications.  Please review your pharmacy choice. Make sure the pharmacy is open so you can pick up prescription now. If there is a problem, you may contact your provider through and have the prescription routed to another pharmacy.  Your safety is important to [employer]. If you have drug allergies check your prescription carefully.   For the next 24 hours you can use MyChart to ask questions about today's visit, request a  non-urgent call back, or ask for a work or school excuse. You will get an email in the next two days asking about your experience. I hope that your e-visit has been valuable and will speed your recovery.  5-10 minutes spent reviewing and documenting in chart.

## 2021-08-29 MED ORDER — DOXYCYCLINE HYCLATE 100 MG PO CAPS: 100.0000 mg | ORAL_CAPSULE | Freq: Two times a day (BID) | ORAL | 0 refills | Status: DC

## 2021-08-29 NOTE — Addendum Note (Signed)
Addended by: Waldon Merl on: 08/29/2021 06:23 AM   Modules accepted: Orders

## 2021-08-30 ENCOUNTER — Ambulatory Visit (HOSPITAL_COMMUNITY)
Admission: RE | Admit: 2021-08-30 | Discharge: 2021-08-30 | Disposition: A | Discharge status: Home / Self Care | Payer: Medicare Other | Source: Ambulatory Visit | Attending: Registered Nurse | Admitting: Registered Nurse

## 2021-08-30 ENCOUNTER — Ambulatory Visit
Admission: RE | Admit: 2021-08-30 | Discharge: 2021-08-30 | Disposition: A | Discharge status: Home / Self Care | Payer: Medicare Other | Source: Ambulatory Visit | Attending: Urgent Care | Admitting: Urgent Care

## 2021-08-30 VITALS — BP 105/68 | HR 107 | Temp 99.1°F | Resp 18

## 2021-08-30 DIAGNOSIS — R1312 Dysphagia, oropharyngeal phase: Secondary | ICD-10-CM | POA: Insufficient documentation

## 2021-08-30 DIAGNOSIS — R1114 Bilious vomiting: Secondary | ICD-10-CM | POA: Insufficient documentation

## 2021-08-30 DIAGNOSIS — R1310 Dysphagia, unspecified: Secondary | ICD-10-CM | POA: Diagnosis not present

## 2021-08-30 DIAGNOSIS — R059 Cough, unspecified: Secondary | ICD-10-CM | POA: Insufficient documentation

## 2021-08-30 DIAGNOSIS — G35 Multiple sclerosis: Secondary | ICD-10-CM | POA: Insufficient documentation

## 2021-08-30 DIAGNOSIS — Z202 Contact with and (suspected) exposure to infections with a predominantly sexual mode of transmission: Secondary | ICD-10-CM | POA: Insufficient documentation

## 2021-08-30 NOTE — Discharge Instructions (Addendum)
Avoid all forms of sexual intercourse (oral, vaginal, anal) for the next 7 days to avoid spreading/reinfecting or at least until we can see what kinds of infection results are positive. Return if symptoms develop including fever, abdominal pain, blood in your urine, or are re-exposed to a sexually transmitted infection (STI).

## 2021-08-30 NOTE — Progress Notes (Signed)
Dr. Epimenio Foot -  FYI. Ms. Lopata was experiencing new dysphagia and given her difficulties I went ahead and ordered a swallow study. She is having some issues with peristalsis as noted. I know you see her back in the office in about 8 weeks, wanted to make sure you were aware.   Thanks,  Luan Pulling

## 2021-08-30 NOTE — Therapy (Signed)
Modified Barium Swallow Progress Note  Patient Details  Name: ROLA LENNON MRN: [id number] Date of Birth: [birth year]  Today's Date: 08/30/2021  Modified Barium Swallow completed.  Full report located under Chart Review in the Imaging Section.  Brief recommendations include the following:  Clinical Impression  Patient presents with very mild oropharyngeal dysphagia that is without aspiration or penetration. During oral phase she exhibited mild piecemeal swallowing with mechanical soft and regular solids but full clearance from oral cavity. During pharyngeal phase, she exhibited mildly decreased pharyngeal peristalsis with mechanical soft, regular textures and 1/2 barium tablet. Trace amount of regular solids residuals remained in upper esophagus s/p initial swallow but fully transited with subsequent swallow. 1/2 barium tablet became briefly lodged in upper esophagus but fully transited following subsequent swallow and sip of liquid. Patient did have sensation of tablet when it was briefly lodged in esophagus. SLP discussed recommendations with patient following this MBS which are to start re-introducing solids into her diet, choosing foods that are not tough, dry, dense, difficul to chew and continuing to drink liquids prior to, during and after PO intake. Patient does feel that she has developed something of an aversion to solids secondary to the difficulty she has had in recent past.   Swallow Evaluation Recommendations       SLP Diet Recommendations: Thin liquid;Dysphagia 3 (Mech soft) solids   Liquid Administration via: Cup;Straw   Medication Administration: Whole meds with liquid                     Angela Nevin, MA, CCC-SLP Speech Therapy

## 2021-08-30 NOTE — ED Triage Notes (Signed)
Pt here for STI testing and exposure to chlamydia from boyfriend. No sxs.

## 2021-08-30 NOTE — ED Provider Notes (Addendum)
[name]   MRN: [id number] DOB: 10/07/1995  Subjective:   Tracy Mcconnell is a 26 y.o. female presenting for testing for chlamydia.  Patient had a video visit yesterday and was started on doxycycline for empiric treatment given exposure to chlamydia from her ex-boyfriend. Denies fever, n/v, abdominal pain, pelvic pain, rashes, dysuria, urinary frequency, hematuria, vaginal discharge.  No concern for pregnancy.   No current facility-administered medications for this encounter.  Current Outpatient Medications:    acetaminophen (TYLENOL) 325 MG tablet, Take 2 tablets (650 mg total) by mouth every 6 (six) hours as needed for mild pain (or temp > 100)., Disp: , Rfl:    Dimethyl Fumarate (TECFIDERA) 240 MG CPDR, Take 1 capsule (240 mg total) by mouth in the morning and at bedtime., Disp: 180 capsule, Rfl: 3   doxycycline (VIBRAMYCIN) 100 MG capsule, Take 1 capsule (100 mg total) by mouth 2 (two) times daily., Disp: 14 capsule, Rfl: 0   FLUoxetine (PROZAC) 40 MG capsule, TAKE 2 CAPSULES (80 MG TOTAL) BY MOUTH IN THE MORNING., Disp: 30 capsule, Rfl: 1   gabapentin (NEURONTIN) 600 MG tablet, Take 1 tablet (600 mg total) by mouth 4 (four) times daily., Disp: 120 tablet, Rfl: 0   hydrOXYzine (ATARAX/VISTARIL) 25 MG tablet, Take 1 tablet (25 mg total) by mouth 3 (three) times daily as needed for anxiety., Disp: 30 tablet, Rfl: 1   loperamide (IMODIUM) 2 MG capsule, Take 1 capsule (2 mg total) by mouth as needed for diarrhea or loose stools., Disp: 30 capsule, Rfl: 0   methylPREDNISolone (MEDROL DOSEPAK) 4 MG TBPK tablet, Take 24 mg on day 1, 20 mg on day 2, 16 mg on day 3, 12 mg on day 4, 8 mg on day 5, 4 mg on day 6., Disp: 21 tablet, Rfl: 0   naproxen (NAPROSYN) 500 MG tablet, Take 1 tablet (500 mg total) by mouth 2 (two) times daily with a meal., Disp: 60 tablet, Rfl: 1   ondansetron (ZOFRAN ODT) 4 MG disintegrating tablet, Take 1 tablet (4 mg total) by mouth every  8 (eight) hours as needed for nausea or vomiting., Disp: 20 tablet, Rfl: 0   ondansetron (ZOFRAN) 4 MG tablet, Take 1 tablet (4 mg total) by mouth every 8 (eight) hours as needed for nausea or vomiting., Disp: 20 tablet, Rfl: 0   pantoprazole (PROTONIX) 40 MG tablet, Take 1 tablet (40 mg total) by mouth daily., Disp: 30 tablet, Rfl: 3   QUEtiapine (SEROQUEL) 100 MG tablet, Take 1 tablet (100 mg total) by mouth at bedtime., Disp: 30 tablet, Rfl: 1   sucralfate (CARAFATE) 1 GM/10ML suspension, Take 10 mLs (1 g total) by mouth 4 (four) times daily -  with meals and at bedtime., Disp: 420 mL, Rfl: 0   Ubrogepant (UBRELVY) 100 MG TABS, Take 100 mg by mouth daily as needed. Take one tablet at onset of headache, may repeat 1 tablet in 2 hours, no more than 2 tablets in 24 hours, Disp: 10 tablet, Rfl: 11   Allergies  Allergen Reactions   Sumatriptan Shortness Of Breath and Other (See Comments)    Entire body "felt heavy"   Other Itching, Swelling and Other (See Comments)    Pesticides (family history)- Exposure face and mouth itch and the tongue/lips swell    Past Medical History:  Diagnosis Date   Anemia    Anxiety    Depression    GERD (gastroesophageal reflux disease)    Hypovitaminosis D 04/07/2019  Left eye complaint 06/08/2019   Migraines    MS (multiple sclerosis) (HCC)      Past Surgical History:  Procedure Laterality Date   CHOLECYSTECTOMY N/A 10/19/2019   Procedure: LAPAROSCOPIC CHOLECYSTECTOMY;  Surgeon: Berna Bue, MD;  Location: [facility];  Service: General;  Laterality: N/A;   LAPAROSCOPIC APPENDECTOMY N/A 01/17/2020   Procedure: APPENDECTOMY LAPAROSCOPIC;  Surgeon: Harriette Bouillon, MD;  Location: [facility];  Service: General;  Laterality: N/A;    Family History  Problem Relation Age of Onset   Migraines Mother    Thyroid disease Mother    Cancer Maternal Grandmother    Hypertension Maternal Grandfather    Rheum arthritis Paternal Grandmother    Diabetes Paternal  Grandmother    Stroke Paternal Grandmother    Diabetes Paternal Grandfather     Social History   Tobacco Use   Smoking status: Never   Smokeless tobacco: Never  Vaping Use   Vaping Use: Never used  Substance Use Topics   Alcohol use: Not Currently   Drug use: No    ROS   Objective:   Vitals: BP 105/68   Pulse (!) 107   Temp 99.1 F (37.3 C)   Resp 18   LMP 08/10/2021 (Exact Date)   SpO2 99%   Physical Exam Constitutional:      General: She is not in acute distress.    Appearance: Normal appearance. She is well-developed. She is not ill-appearing, toxic-appearing or diaphoretic.  HENT:     Head: Normocephalic and atraumatic.     Nose: Nose normal.     Mouth/Throat:     Mouth: Mucous membranes are moist.  Eyes:     General: No scleral icterus.       Right eye: No discharge.        Left eye: No discharge.     Extraocular Movements: Extraocular movements intact.  Cardiovascular:     Rate and Rhythm: Normal rate.  Pulmonary:     Effort: Pulmonary effort is normal.  Skin:    General: Skin is warm and dry.  Neurological:     General: No focal deficit present.     Mental Status: She is alert and oriented to person, place, and time.  Psychiatric:        Mood and Affect: Mood normal.        Behavior: Behavior normal.    Assessment and Plan :   PDMP not reviewed this encounter.  1. Chlamydia contact    Patient is already on doxycycline.  Labs pending, will treat as appropriate otherwise.  Declined UPT, HIV and syphilis testing.   Wallis Bamberg, PA-C 08/30/21 1326

## 2021-08-31 ENCOUNTER — Telehealth: Payer: Medicare Other | Admitting: Physician Assistant

## 2021-08-31 DIAGNOSIS — S025XXA Fracture of tooth (traumatic), initial encounter for closed fracture: Secondary | ICD-10-CM

## 2021-08-31 LAB — CERVICOVAGINAL ANCILLARY ONLY
Chlamydia: POSITIVE — AB
Comment: NEGATIVE
Comment: NEGATIVE
Comment: NORMAL
Neisseria Gonorrhea: NEGATIVE
Trichomonas: NEGATIVE

## 2021-08-31 MED ORDER — NAPROXEN 500 MG PO TABS: 500.0000 mg | ORAL_TABLET | Freq: Two times a day (BID) | ORAL | 0 refills | Status: DC

## 2021-08-31 NOTE — Progress Notes (Signed)
E-Visit for Dental Pain  We are sorry that you are not feeling well.  Here is how we plan to help!  Based on what you have shared with me in the questionnaire, it sounds like you have broken tooth/dental inflammation.   I have prescribed naprosyn 500mg  2 times per day for 7 days for discomfort. I know you are already on an antibiotic so continue as directed but if not improving you need to be seen in person for examination to evaluate for a dental abscess.   It is imperative that you see a dentist within 10 days of this eVisit to determine the cause of the dental pain and be sure it is adequately treated  A toothache or tooth pain is caused when the nerve in the root of a tooth or surrounding a tooth is irritated. Dental (tooth) infection, decay, injury, or loss of a tooth are the most common causes of dental pain. Pain may also occur after an extraction (tooth is pulled out). Pain sometimes originates from other areas and radiates to the jaw, thus appearing to be tooth pain.Bacteria growing inside your mouth can contribute to gum disease and dental decay, both of which can cause pain. A toothache occurs from inflammation of the central portion of the tooth called pulp. The pulp contains nerve endings that are very sensitive to pain. Inflammation to the pulp or pulpitis may be caused by dental cavities, trauma, and infection.    HOME CARE:   For toothaches: Over-the-counter pain medications such as acetaminophen or ibuprofen may be used. Take these as directed on the package while you arrange for a dental appointment. Avoid very cold or hot foods, because they may make the pain worse. You may get relief from biting on a cotton ball soaked in oil of cloves. You can get oil of cloves at most drug stores.  For jaw pain:  Aspirin may be helpful for problems in the joint of the jaw in adults. If pain happens every time you open your mouth widely, the temporomandibular joint (TMJ) may be the source  of the pain. Yawning or taking a large bite of food may worsen the pain. An appointment with your doctor or dentist will help you find the cause.     GET HELP RIGHT AWAY IF:  You have a high fever or chills If you have had a recent head or face injury and develop headache, light headedness, nausea, vomiting, or other symptoms that concern you after an injury to your face or mouth, you could have a more serious injury in addition to your dental injury. A facial rash associated with a toothache: This condition may improve with medication. Contact your doctor for them to decide what is appropriate. Any jaw pain occurring with chest pain: Although jaw pain is most commonly caused by dental disease, it is sometimes referred pain from other areas. People with heart disease, especially people who have had stents placed, people with diabetes, or those who have had heart surgery may have jaw pain as a symptom of heart attack or angina. If your jaw or tooth pain is associated with lightheadedness, sweating, or shortness of breath, you should see a doctor as soon as possible. Trouble swallowing or excessive pain or bleeding from gums: If you have a history of a weakened immune system, diabetes, or steroid use, you may be more susceptible to infections. Infections can often be more severe and extensive or caused by unusual organisms. Dental and gum infections in people  with these conditions may require more aggressive treatment. An abscess may need draining or IV antibiotics, for example.  MAKE SURE YOU   Understand these instructions. Will watch your condition. Will get help right away if you are not doing well or get worse.  Thank you for choosing an e-visit.  Your e-visit answers were reviewed by a board certified advanced clinical practitioner to complete your personal care plan. Depending upon the condition, your plan could have included both over the counter or prescription medications.  Please  review your pharmacy choice. Make sure the pharmacy is open so you can pick up prescription now. If there is a problem, you may contact your provider through [employer] and have the prescription routed to another pharmacy.  Your safety is important to [place]. If you have drug allergies check your prescription carefully.   For the next 24 hours you can use MyChart to ask questions about today's visit, request a non-urgent call back, or ask for a work or school excuse. You will get an email in the next two days asking about your experience. I hope that your e-visit has been valuable and will speed your recovery.

## 2021-08-31 NOTE — Progress Notes (Signed)
I have spent 5 minutes in review of e-visit questionnaire, review and updating patient chart, medical decision making and response to patient.   Shantese Raven Cody Kylen Schliep, PA-C    

## 2021-09-14 ENCOUNTER — Encounter: Payer: Self-pay | Admitting: Registered Nurse

## 2021-09-18 ENCOUNTER — Telehealth: Payer: Medicare Other | Admitting: Physician Assistant

## 2021-09-18 DIAGNOSIS — R3989 Other symptoms and signs involving the genitourinary system: Secondary | ICD-10-CM

## 2021-09-18 MED ORDER — CEPHALEXIN 500 MG PO CAPS: 500.0000 mg | ORAL_CAPSULE | Freq: Two times a day (BID) | ORAL | 0 refills | Status: DC

## 2021-09-18 NOTE — Progress Notes (Signed)

## 2021-09-19 ENCOUNTER — Ambulatory Visit
Admission: RE | Admit: 2021-09-19 | Discharge: 2021-09-19 | Disposition: A | Discharge status: Home / Self Care | Payer: Medicare Other | Source: Ambulatory Visit | Attending: Urgent Care | Admitting: Urgent Care

## 2021-09-19 VITALS — BP 101/70 | HR 95 | Temp 98.1°F | Resp 20

## 2021-09-19 DIAGNOSIS — Z8619 Personal history of other infectious and parasitic diseases: Secondary | ICD-10-CM

## 2021-09-19 DIAGNOSIS — R300 Dysuria: Secondary | ICD-10-CM

## 2021-09-19 DIAGNOSIS — R102 Pelvic and perineal pain: Secondary | ICD-10-CM | POA: Diagnosis present

## 2021-09-19 LAB — POCT URINALYSIS DIP (MANUAL ENTRY)
Bilirubin, UA: NEGATIVE
Glucose, UA: NEGATIVE mg/dL
Ketones, POC UA: NEGATIVE mg/dL
Leukocytes, UA: NEGATIVE
Nitrite, UA: NEGATIVE
Protein Ur, POC: NEGATIVE mg/dL
Spec Grav, UA: 1.025 (ref 1.010–1.025)
Urobilinogen, UA: 0.2 E.U./dL
pH, UA: 6 (ref 5.0–8.0)

## 2021-09-19 LAB — POCT URINE PREGNANCY: Preg Test, Ur: NEGATIVE

## 2021-09-19 MED ORDER — PHENAZOPYRIDINE HCL 200 MG PO TABS: 200.0000 mg | ORAL_TABLET | Freq: Three times a day (TID) | ORAL | 0 refills | Status: DC | PRN

## 2021-09-19 NOTE — ED Provider Notes (Signed)
[name]   MRN: [id number] DOB: [birth year]  Subjective:   Tracy Mcconnell is a 26 y.o. female presenting for 2-day history of acute onset dysuria, pelvic pain.  At the end of March, patient was treated for chlamydia from an exposure.  She had no symptoms at the time.  She completed a course of doxycycline.  Testing confirms she did have chlamydia.  Since then she developed the symptoms in the past 2 days.  No fever, nausea, vomiting, abdominal pain, vaginal discharge, hematuria, genital rash.  Would like repeat testing.  No current facility-administered medications for this encounter.  Current Outpatient Medications:    acetaminophen (TYLENOL) 325 MG tablet, Take 2 tablets (650 mg total) by mouth every 6 (six) hours as needed for mild pain (or temp > 100)., Disp: , Rfl:    cephALEXin (KEFLEX) 500 MG capsule, Take 1 capsule (500 mg total) by mouth 2 (two) times daily., Disp: 14 capsule, Rfl: 0   Dimethyl Fumarate (TECFIDERA) 240 MG CPDR, Take 1 capsule (240 mg total) by mouth in the morning and at bedtime., Disp: 180 capsule, Rfl: 3   doxycycline (VIBRAMYCIN) 100 MG capsule, Take 1 capsule (100 mg total) by mouth 2 (two) times daily., Disp: 14 capsule, Rfl: 0   FLUoxetine (PROZAC) 40 MG capsule, TAKE 2 CAPSULES (80 MG TOTAL) BY MOUTH IN THE MORNING., Disp: 30 capsule, Rfl: 1   gabapentin (NEURONTIN) 600 MG tablet, Take 1 tablet (600 mg total) by mouth 4 (four) times daily., Disp: 120 tablet, Rfl: 0   hydrOXYzine (ATARAX/VISTARIL) 25 MG tablet, Take 1 tablet (25 mg total) by mouth 3 (three) times daily as needed for anxiety., Disp: 30 tablet, Rfl: 1   loperamide (IMODIUM) 2 MG capsule, Take 1 capsule (2 mg total) by mouth as needed for diarrhea or loose stools., Disp: 30 capsule, Rfl: 0   methylPREDNISolone (MEDROL DOSEPAK) 4 MG TBPK tablet, Take 24 mg on day 1, 20 mg on day 2, 16 mg on day 3, 12 mg on day 4, 8 mg on day 5, 4 mg on day 6., Disp: 21 tablet, Rfl:  0   naproxen (NAPROSYN) 500 MG tablet, Take 1 tablet (500 mg total) by mouth 2 (two) times daily with a meal., Disp: 30 tablet, Rfl: 0   ondansetron (ZOFRAN ODT) 4 MG disintegrating tablet, Take 1 tablet (4 mg total) by mouth every 8 (eight) hours as needed for nausea or vomiting., Disp: 20 tablet, Rfl: 0   ondansetron (ZOFRAN) 4 MG tablet, Take 1 tablet (4 mg total) by mouth every 8 (eight) hours as needed for nausea or vomiting., Disp: 20 tablet, Rfl: 0   pantoprazole (PROTONIX) 40 MG tablet, Take 1 tablet (40 mg total) by mouth daily., Disp: 30 tablet, Rfl: 3   QUEtiapine (SEROQUEL) 100 MG tablet, Take 1 tablet (100 mg total) by mouth at bedtime., Disp: 30 tablet, Rfl: 1   sucralfate (CARAFATE) 1 GM/10ML suspension, Take 10 mLs (1 g total) by mouth 4 (four) times daily -  with meals and at bedtime., Disp: 420 mL, Rfl: 0   Ubrogepant (UBRELVY) 100 MG TABS, Take 100 mg by mouth daily as needed. Take one tablet at onset of headache, may repeat 1 tablet in 2 hours, no more than 2 tablets in 24 hours, Disp: 10 tablet, Rfl: 11   Allergies  Allergen Reactions   Sumatriptan Shortness Of Breath and Other (See Comments)    Entire body "felt heavy"   Other Itching, Swelling  and Other (See Comments)    Pesticides (family history)- Exposure face and mouth itch and the tongue/lips swell    Past Medical History:  Diagnosis Date   Anemia    Anxiety    Depression    GERD (gastroesophageal reflux disease)    Hypovitaminosis D 04/07/2019   Left eye complaint 06/08/2019   Migraines    MS (multiple sclerosis) (HCC)      Past Surgical History:  Procedure Laterality Date   CHOLECYSTECTOMY N/A 10/19/2019   Procedure: LAPAROSCOPIC CHOLECYSTECTOMY;  Surgeon: Berna Bue, MD;  Location: [facility];  Service: General;  Laterality: N/A;   LAPAROSCOPIC APPENDECTOMY N/A 01/17/2020   Procedure: APPENDECTOMY LAPAROSCOPIC;  Surgeon: Harriette Bouillon, MD;  Location: [facility];  Service: General;  Laterality: N/A;     Family History  Problem Relation Age of Onset   Migraines Mother    Thyroid disease Mother    Cancer Maternal Grandmother    Hypertension Maternal Grandfather    Rheum arthritis Paternal Grandmother    Diabetes Paternal Grandmother    Stroke Paternal Grandmother    Diabetes Paternal Grandfather     Social History   Tobacco Use   Smoking status: Never   Smokeless tobacco: Never  Vaping Use   Vaping Use: Never used  Substance Use Topics   Alcohol use: Not Currently   Drug use: No    ROS   Objective:   Vitals: BP 101/70   Pulse 95   Temp 98.1 F (36.7 C)   Resp 20   LMP 09/12/2021 (Approximate)   SpO2 98%   Physical Exam Constitutional:      General: She is not in acute distress.    Appearance: Normal appearance. She is well-developed. She is not ill-appearing, toxic-appearing or diaphoretic.  HENT:     Head: Normocephalic and atraumatic.     Nose: Nose normal.     Mouth/Throat:     Mouth: Mucous membranes are moist.  Eyes:     General: No scleral icterus.       Right eye: No discharge.        Left eye: No discharge.     Extraocular Movements: Extraocular movements intact.     Conjunctiva/sclera: Conjunctivae normal.  Cardiovascular:     Rate and Rhythm: Normal rate.  Pulmonary:     Effort: Pulmonary effort is normal.  Abdominal:     General: Bowel sounds are normal. There is no distension.     Palpations: Abdomen is soft. There is no mass.     Tenderness: There is abdominal tenderness in the suprapubic area. There is no right CVA tenderness, left CVA tenderness, guarding or rebound.    Skin:    General: Skin is warm and dry.  Neurological:     General: No focal deficit present.     Mental Status: She is alert and oriented to person, place, and time.  Psychiatric:        Mood and Affect: Mood normal.        Behavior: Behavior normal.        Thought Content: Thought content normal.        Judgment: Judgment normal.     Results for orders  placed or performed during the hospital encounter of 09/19/21 (from the past 24 hour(s))  POCT urinalysis dipstick     Status: Abnormal   Collection Time: 09/19/21 11:37 AM  Result Value Ref Range   Color, UA light yellow (A) yellow   Clarity, UA clear clear  Glucose, UA negative negative mg/dL   Bilirubin, UA negative negative   Ketones, POC UA negative negative mg/dL   Spec Grav, UA 3.903 0.092 - 1.025   Blood, UA trace-intact (A) negative   pH, UA 6.0 5.0 - 8.0   Protein Ur, POC negative negative mg/dL   Urobilinogen, UA 0.2 0.2 or 1.0 E.U./dL   Nitrite, UA Negative Negative   Leukocytes, UA Negative Negative  POCT urine pregnancy     Status: None   Collection Time: 09/19/21 11:37 AM  Result Value Ref Range   Preg Test, Ur Negative Negative    Assessment and Plan :   PDMP not reviewed this encounter.  1. Dysuria   2. Pelvic pain   3. History of chlamydia    Low suspicion for acute abdomen.  Patient does hydrate very well and does not drink anything else except water.  Recommended continued hydration.  We will hold off on empiric treatment and based off of a recheck of the vaginal swab.  Urine culture pending.  Use Pyridium in the meantime for symptomatic relief. Counseled patient on potential for adverse effects with medications prescribed/recommended today, ER and return-to-clinic precautions discussed, patient verbalized understanding.    Wallis Bamberg, [place] 09/19/21 1148

## 2021-09-19 NOTE — Discharge Instructions (Signed)
Make sure you hydrate very well with plain water and a quantity of 64 ounces of water a day.  Please limit drinks that are considered urinary irritants such as soda, sweet tea, coffee, energy drinks, alcohol.  These can worsen your urinary and genital symptoms but also be the source of them.  I will let you know about your urine culture and vaginal swab results through MyChart to see if we need to prescribe or change your antibiotics based off of those results. ° °

## 2021-09-19 NOTE — ED Triage Notes (Signed)
Pt here with burning and painful urination along with suprapubic pain x 2 days

## 2021-09-20 LAB — CERVICOVAGINAL ANCILLARY ONLY
Bacterial Vaginitis (gardnerella): POSITIVE — AB
Chlamydia: NEGATIVE
Comment: NEGATIVE
Comment: NEGATIVE
Comment: NEGATIVE
Comment: NORMAL
Neisseria Gonorrhea: NEGATIVE
Trichomonas: NEGATIVE

## 2021-09-21 ENCOUNTER — Telehealth (HOSPITAL_COMMUNITY): Payer: Self-pay | Admitting: Emergency Medicine

## 2021-09-21 MED ORDER — METRONIDAZOLE 500 MG PO TABS: 500.0000 mg | ORAL_TABLET | Freq: Two times a day (BID) | ORAL | 0 refills | Status: DC

## 2021-10-25 ENCOUNTER — Ambulatory Visit (INDEPENDENT_AMBULATORY_CARE_PROVIDER_SITE_OTHER): Payer: Medicare Other | Admitting: Family Medicine

## 2021-10-25 DIAGNOSIS — G43709 Chronic migraine without aura, not intractable, without status migrainosus: Secondary | ICD-10-CM | POA: Diagnosis not present

## 2021-10-25 MED ORDER — ONABOTULINUMTOXINA 100 UNITS IJ SOLR
155.0000 [IU] | Freq: Once | INTRAMUSCULAR | Status: AC
  Administered 2021-10-25: 155 [IU] via INTRAMUSCULAR

## 2021-10-25 NOTE — Progress Notes (Signed)
Botox- 100 units x 2 vials/ Lot: [id number] Expiration: 02/2024 NDC: 0355-9741-63  Bacteriostatic 0.9% Sodium Chloride- 60mL total Lot: [id number] Expiration: 12/02/2022 NDC: 6803-2122-48  Dx: G50.037 B/B

## 2021-10-25 NOTE — Progress Notes (Signed)
10/25/2021 ALL: Tracy Mcconnell returns for Botox. She is doing well. She has about 10 headache days a month with 2-3 migraines. [name] works well.   08/02/2021 ALL: Tracy Mcconnell returns for Botox. She continues to do well. Last seen by Dr Epimenio Foot 05/2021. MS stable on Tecfidera.   05/09/2021 ALL: She returns for Botox. She feels migraines are well managed. She may have 7-8 milder headaches per month but onlu 1-2 migraines. [name] helps with abortive therapy. No new MS symptoms. She continues Tecfidera. She missed her follow up with Dr Epimenio Foot 04/19/21.   02/14/2021 ALL: She continues to do well on Botox. [name] helps with migraine abortion. She feels migraines are better She has about 10 headache days with 3 severe headaches monthly. She continues tecfidera. She has follow up with [facility] in January. She denies new or worsening symptoms.   11/03/2020: She continues to do well with Botox therapy. Migraines are well managed. Nurtec seems to work best for abortive therapy but not covered. She is using Ubrelvy that helps some. She was started on Tecfidera 09/2020 following concerns of 1 new focus on brain MRI. She is tolerating well and has follow up with Dr Epimenio Foot 04/2021.   08/02/2020 ALL: She continues to note benefit of Botox in migraine management. She continues Ubrelvy for abortive therapy. She continues follow up with Dr Epimenio Foot. Reports bilateral face, upper and lower extremity numbness on two separate occasions. No changes in MRI last performed 05/2020. She has follow up with Dr Epimenio Foot in 12/2020.   04/21/2020 ALL: She returns today for fourth Botox procedure. She tolerated last procedure well [name]). She does feel Botox is helping migraines. She does continue to have about 10 migraine days per month. She has failed sumatriptan and rizatriptan. She is taking gabapentin and indomethacin for back pain. Nurtec works well but not covered by [employer]. She has not tried [place]. 1 Nurtec sample and 2 Ubrelvy 50mg  samples  given in office today.   She did have optic neuritis in March. MRI concerning for foci. LP performed and CSF normal. She is being followed by Dr April every 6 months with repeat imaging planned. She reports vision loss of left eye resolved. She has had some blurred/double vision bilaterally and is followed by Dr Epimenio Foot.    Consent Form Botulism Toxin Injection For Chronic Migraine    Reviewed orally with patient, additionally signature is on file:  Botulism toxin has been approved by the Federal drug administration for treatment of chronic migraine. Botulism toxin does not cure chronic migraine and it may not be effective in some patients.  The administration of botulism toxin is accomplished by injecting a small amount of toxin into the muscles of the neck and head. Dosage must be titrated for each individual. Any benefits resulting from botulism toxin tend to wear off after 3 months with a repeat injection required if benefit is to be maintained. Injections are usually done every 3-4 months with maximum effect peak achieved by about 2 or 3 weeks. Botulism toxin is expensive and you should be sure of what costs you will incur resulting from the injection.  The side effects of botulism toxin use for chronic migraine may include:   -Transient, and usually mild, facial weakness with facial injections  -Transient, and usually mild, head or neck weakness with head/neck injections  -Reduction or loss of forehead facial animation due to forehead muscle weakness  -Eyelid drooping  -Dry eye  -Pain at the site of injection or bruising  at the site of injection  -Double vision  -Potential unknown long term risks   Contraindications: You should not have Botox if you are pregnant, nursing, allergic to albumin, have an infection, skin condition, or muscle weakness at the site of the injection, or have myasthenia gravis, Lambert-Eaton syndrome, or ALS.  It is also possible that as with any injection,  there may be an allergic reaction or no effect from the medication. Reduced effectiveness after repeated injections is sometimes seen and rarely infection at the injection site may occur. All care will be taken to prevent these side effects. If therapy is given over a long time, atrophy and wasting in the muscle injected may occur. Occasionally the patient's become refractory to treatment because they develop antibodies to the toxin. In this event, therapy needs to be modified.  I have read the above information and consent to the administration of botulism toxin.    BOTOX PROCEDURE NOTE FOR MIGRAINE HEADACHE  Contraindications and precautions discussed with patient(above). Aseptic procedure was observed and patient tolerated procedure. Procedure performed by Shawnie Dapper, FNP-C.   The condition has existed for more than 6 months, and pt does not have a diagnosis of ALS, Myasthenia Gravis or Lambert-Eaton Syndrome.  Risks and benefits of injections discussed and pt agrees to proceed with the procedure.  Written consent obtained  These injections are medically necessary. Pt  receives good benefits from these injections. These injections do not cause sedations or hallucinations which the oral therapies may cause.   Description of procedure:  The patient was placed in a sitting position. The standard protocol was used for Botox as follows, with 5 units of Botox injected at each site:  -Procerus muscle, midline injection  -Corrugator muscle, bilateral injection  -Frontalis muscle, bilateral injection, with 2 sites each side, medial injection was performed in the upper one third of the frontalis muscle, in the region vertical from the medial inferior edge of the superior orbital rim. The lateral injection was again in the upper one third of the forehead vertically above the lateral limbus of the cornea, 1.5 cm lateral to the medial injection site.  -Temporalis muscle injection, 4 sites, bilaterally.  The first injection was 3 cm above the tragus of the ear, second injection site was 1.5 cm to 3 cm up from the first injection site in line with the tragus of the ear. The third injection site was 1.5-3 cm forward between the first 2 injection sites. The fourth injection site was 1.5 cm posterior to the second injection site. 5th site laterally in the temporalis  muscleat the level of the outer canthus.  -Occipitalis muscle injection, 3 sites, bilaterally. The first injection was done one half way between the occipital protuberance and the tip of the mastoid process behind the ear. The second injection site was done lateral and superior to the first, 1 fingerbreadth from the first injection. The third injection site was 1 fingerbreadth superiorly and medially from the first injection site.  -Cervical paraspinal muscle injection, 2 sites, bilaterally. The first injection site was 1 cm from the midline of the cervical spine, 3 cm inferior to the lower border of the occipital protuberance. The second injection site was 1.5 cm superiorly and laterally to the first injection site.  -Trapezius muscle injection was performed at 3 sites, bilaterally. The first injection site was in the upper trapezius muscle halfway between the inflection point of the neck, and the acromion. The second injection site was one half way between  the acromion and the first injection site. The third injection was done between the first injection site and the inflection point of the neck.   Will return for repeat injection in 3 months.   A total of 200 units of Botox was prepared, 155 units of Botox was injected as documented above, any Botox not injected was wasted. The patient tolerated the procedure well, there were no complications of the above procedure.

## 2021-11-29 ENCOUNTER — Ambulatory Visit: Payer: Medicare Other | Admitting: Neurology

## 2021-12-03 ENCOUNTER — Telehealth: Payer: Medicare Other | Admitting: Nurse Practitioner

## 2021-12-03 ENCOUNTER — Other Ambulatory Visit (HOSPITAL_COMMUNITY): Payer: Self-pay | Admitting: Physician Assistant

## 2021-12-03 ENCOUNTER — Encounter: Payer: Self-pay | Admitting: Family Medicine

## 2021-12-03 DIAGNOSIS — G4700 Insomnia, unspecified: Secondary | ICD-10-CM | POA: Diagnosis not present

## 2021-12-03 DIAGNOSIS — F411 Generalized anxiety disorder: Secondary | ICD-10-CM

## 2021-12-03 DIAGNOSIS — F332 Major depressive disorder, recurrent severe without psychotic features: Secondary | ICD-10-CM | POA: Diagnosis not present

## 2021-12-03 MED ORDER — QUETIAPINE FUMARATE 100 MG PO TABS: 100.0000 mg | ORAL_TABLET | Freq: Every day | ORAL | 0 refills | Status: DC

## 2021-12-03 MED ORDER — FLUOXETINE HCL 40 MG PO CAPS: 80.0000 mg | ORAL_CAPSULE | Freq: Every morning | ORAL | 0 refills | Status: DC

## 2021-12-03 MED ORDER — BUPROPION HCL ER (XL) 300 MG PO TB24: 300.0000 mg | ORAL_TABLET | Freq: Every day | ORAL | 0 refills | Status: DC

## 2021-12-03 MED ORDER — BUSPIRONE HCL 30 MG PO TABS: 30.0000 mg | ORAL_TABLET | Freq: Two times a day (BID) | ORAL | 0 refills | Status: DC

## 2021-12-03 NOTE — Addendum Note (Signed)
Addended by: Bertram Denver on: 12/03/2021 01:39 PM   Modules accepted: Orders

## 2021-12-03 NOTE — Progress Notes (Signed)
Virtual Visit Consent   Tracy Mcconnell, you are scheduled for a virtual visit with a [facility] provider today. Just as with appointments in the office, your consent must be obtained to participate. Your consent will be active for this visit and any virtual visit you may have with one of our providers in the next 365 days. If you have a MyChart account, a copy of this consent can be sent to you electronically.  As this is a virtual visit, video technology does not allow for your provider to perform a traditional examination. This may limit your provider's ability to fully assess your condition. If your provider identifies any concerns that need to be evaluated in person or the need to arrange testing (such as labs, EKG, etc.), we will make arrangements to do so. Although advances in technology are sophisticated, we cannot ensure that it will always work on either your end or our end. If the connection with a video visit is poor, the visit may have to be switched to a telephone visit. With either a video or telephone visit, we are not always able to ensure that we have a secure connection.  By engaging in this virtual visit, you consent to the provision of healthcare and authorize for your insurance to be billed (if applicable) for the services provided during this visit. Depending on your insurance coverage, you may receive a charge related to this service.  I need to obtain your verbal consent now. Are you willing to proceed with your visit today? Tracy Mcconnell has provided verbal consent on 12/03/2021 for a virtual visit (video or telephone). Tracy Rigg, NP  Date: 12/03/2021 1:40 PM  Virtual Visit via Video Note   I, Tracy Mcconnell, connected with  Tracy Mcconnell  ([id number], Jan 24, 1996) on 12/03/21 at  1:30 PM EDT by a video-enabled telemedicine application and verified that I am speaking with the correct person using two identifiers.  Location: Patient: Virtual  Visit Location Patient: Home Provider: Virtual Visit Location Provider: Home Office   I discussed the limitations of evaluation and management by telemedicine and the availability of in person appointments. The patient expressed understanding and agreed to proceed.    History of Present Illness: Tracy Mcconnell is a 26 y.o. who identifies as a female who was assigned female at birth, and is being seen today for medication refills.  [name] has a history of Anxiety, depression, insomnia. She has not been seen by her behavioral health therapist since 12-2020. States she missed her appt in December due to transportation issues. Would like a refill on her medications today.    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   Severe episode of recurrent major depressive disorder, without psychotic features (HCC) 09/10/2020   Chronic migraine without aura without status migrainosus, not intractable 09/08/2020   Myalgia 09/08/2020   Acute appendicitis 01/17/2020   Abdominal pain 01/16/2020   Left lumbar radiculitis 07/24/2019   Vitamin D deficiency 07/15/2019   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   Numbness 06/25/2019   Blurry vision    Generalized anxiety disorder    Optic neuritis 06/10/2019   Hypovitaminosis D 04/07/2019   Migraines 10/28/2018   Lumbar radiculopathy 10/28/2018   Chronic daily headache 02/04/2017    Allergies:  Allergies  Allergen Reactions  Sumatriptan Shortness Of Breath and Other (See Comments)    Entire body "felt heavy"   Other Itching, Swelling and Other (See Comments)    Pesticides (family history)- Exposure face and mouth itch and the tongue/lips swell   Medications:  Current Outpatient Medications:    acetaminophen (TYLENOL) 325 MG tablet, Take 2 tablets (650 mg total) by  mouth every 6 (six) hours as needed for mild pain (or temp > 100)., Disp: , Rfl:    buPROPion (WELLBUTRIN XL) 300 MG 24 hr tablet, Take 1 tablet (300 mg total) by mouth daily. NO ADDITIONAL REFILLS unless BY behavioral health, Disp: 30 tablet, Rfl: 0   busPIRone (BUSPAR) 30 MG tablet, Take 1 tablet (30 mg total) by mouth in the morning and at bedtime. NO additional refills unless by behavioral health, Disp: 60 tablet, Rfl: 0   Dimethyl Fumarate (TECFIDERA) 240 MG CPDR, Take 1 capsule (240 mg total) by mouth in the morning and at bedtime., Disp: 180 capsule, Rfl: 3   FLUoxetine (PROZAC) 40 MG capsule, Take 2 capsules (80 mg total) by mouth in the morning. NO additional refills unless by behavioral health, Disp: 60 capsule, Rfl: 0   gabapentin (NEURONTIN) 600 MG tablet, Take 1 tablet (600 mg total) by mouth 4 (four) times daily., Disp: 120 tablet, Rfl: 0   hydrOXYzine (ATARAX/VISTARIL) 25 MG tablet, Take 1 tablet (25 mg total) by mouth 3 (three) times daily as needed for anxiety., Disp: 30 tablet, Rfl: 1   loperamide (IMODIUM) 2 MG capsule, Take 1 capsule (2 mg total) by mouth as needed for diarrhea or loose stools., Disp: 30 capsule, Rfl: 0   metroNIDAZOLE (FLAGYL) 500 MG tablet, Take 1 tablet (500 mg total) by mouth 2 (two) times daily., Disp: 14 tablet, Rfl: 0   ondansetron (ZOFRAN ODT) 4 MG disintegrating tablet, Take 1 tablet (4 mg total) by mouth every 8 (eight) hours as needed for nausea or vomiting., Disp: 20 tablet, Rfl: 0   ondansetron (ZOFRAN) 4 MG tablet, Take 1 tablet (4 mg total) by mouth every 8 (eight) hours as needed for nausea or vomiting., Disp: 20 tablet, Rfl: 0   pantoprazole (PROTONIX) 40 MG tablet, Take 1 tablet (40 mg total) by mouth daily., Disp: 30 tablet, Rfl: 3   phenazopyridine (PYRIDIUM) 200 MG tablet, Take 1 tablet (200 mg total) by mouth 3 (three) times daily as needed for pain., Disp: 15 tablet, Rfl: 0   QUEtiapine (SEROQUEL) 100 MG tablet, Take 1 tablet (100 mg total) by  mouth at bedtime. NO additional refills unless by behavioral health, Disp: 30 tablet, Rfl: 0   sucralfate (CARAFATE) 1 GM/10ML suspension, Take 10 mLs (1 g total) by mouth 4 (four) times daily -  with meals and at bedtime., Disp: 420 mL, Rfl: 0   Ubrogepant (UBRELVY) 100 MG TABS, Take 100 mg by mouth daily as needed. Take one tablet at onset of headache, may repeat 1 tablet in 2 hours, no more than 2 tablets in 24 hours, Disp: 10 tablet, Rfl: 11  Observations/Objective: Patient is well-developed, well-nourished in no acute distress.  Resting comfortably at home.  Head is normocephalic, atraumatic.  No labored breathing.  Speech is clear and coherent with logical content.  Patient is alert and oriented at baseline.    Assessment and Plan: 1. Generalized anxiety disorder  2. Severe episode of recurrent major depressive disorder, without psychotic features (HCC)  3. Insomnia, unspecified type  She is aware this a one time refill only of buspar, buspirone,  fluoxetine and quetiapine.   NEEDS TO follow up with behavioral health  Recommended she reach out to her Behavioral health therapist and schedule virtual visit if she is having transportation issues.    Follow Up Instructions: I discussed the assessment and treatment plan with the patient. The patient was provided an opportunity to ask questions and all were answered. The patient agreed with the plan and demonstrated an understanding of the instructions.  A copy of instructions were sent to the patient via MyChart unless otherwise noted below.   The patient was advised to call back or seek an in-person evaluation if the symptoms worsen or if the condition fails to improve as anticipated.  Time:  I spent 11 minutes with the patient via telehealth technology discussing the above problems/concerns.    Tracy Rigg, NP

## 2021-12-03 NOTE — Patient Instructions (Addendum)
Tracy Mcconnell, thank you for joining Claiborne Rigg, NP for today's virtual visit.  While this provider is not your primary care provider (PCP), if your PCP is located in our provider database this encounter information will be shared with them immediately following your visit.  Consent: (Patient) Tracy Mcconnell provided verbal consent for this virtual visit at the beginning of the encounter.  Current Medications:  Current Outpatient Medications:    acetaminophen (TYLENOL) 325 MG tablet, Take 2 tablets (650 mg total) by mouth every 6 (six) hours as needed for mild pain (or temp > 100)., Disp: , Rfl:    buPROPion (WELLBUTRIN XL) 300 MG 24 hr tablet, Take 1 tablet (300 mg total) by mouth daily. NO ADDITIONAL REFILLS unless BY behavioral health, Disp: 30 tablet, Rfl: 0   busPIRone (BUSPAR) 30 MG tablet, Take 1 tablet (30 mg total) by mouth in the morning and at bedtime. NO additional refills unless by behavioral health, Disp: 60 tablet, Rfl: 0   Dimethyl Fumarate (TECFIDERA) 240 MG CPDR, Take 1 capsule (240 mg total) by mouth in the morning and at bedtime., Disp: 180 capsule, Rfl: 3   FLUoxetine (PROZAC) 40 MG capsule, Take 2 capsules (80 mg total) by mouth in the morning. NO additional refills unless by behavioral health, Disp: 60 capsule, Rfl: 0   gabapentin (NEURONTIN) 600 MG tablet, Take 1 tablet (600 mg total) by mouth 4 (four) times daily., Disp: 120 tablet, Rfl: 0   hydrOXYzine (ATARAX/VISTARIL) 25 MG tablet, Take 1 tablet (25 mg total) by mouth 3 (three) times daily as needed for anxiety., Disp: 30 tablet, Rfl: 1   loperamide (IMODIUM) 2 MG capsule, Take 1 capsule (2 mg total) by mouth as needed for diarrhea or loose stools., Disp: 30 capsule, Rfl: 0   metroNIDAZOLE (FLAGYL) 500 MG tablet, Take 1 tablet (500 mg total) by mouth 2 (two) times daily., Disp: 14 tablet, Rfl: 0   ondansetron (ZOFRAN ODT) 4 MG disintegrating tablet, Take 1 tablet (4 mg total) by mouth every 8  (eight) hours as needed for nausea or vomiting., Disp: 20 tablet, Rfl: 0   ondansetron (ZOFRAN) 4 MG tablet, Take 1 tablet (4 mg total) by mouth every 8 (eight) hours as needed for nausea or vomiting., Disp: 20 tablet, Rfl: 0   pantoprazole (PROTONIX) 40 MG tablet, Take 1 tablet (40 mg total) by mouth daily., Disp: 30 tablet, Rfl: 3   phenazopyridine (PYRIDIUM) 200 MG tablet, Take 1 tablet (200 mg total) by mouth 3 (three) times daily as needed for pain., Disp: 15 tablet, Rfl: 0   QUEtiapine (SEROQUEL) 100 MG tablet, Take 1 tablet (100 mg total) by mouth at bedtime. NO additional refills unless by behavioral health, Disp: 30 tablet, Rfl: 0   sucralfate (CARAFATE) 1 GM/10ML suspension, Take 10 mLs (1 g total) by mouth 4 (four) times daily -  with meals and at bedtime., Disp: 420 mL, Rfl: 0   Ubrogepant (UBRELVY) 100 MG TABS, Take 100 mg by mouth daily as needed. Take one tablet at onset of headache, may repeat 1 tablet in 2 hours, no more than 2 tablets in 24 hours, Disp: 10 tablet, Rfl: 11   Medications ordered in this encounter:  No orders of the defined types were placed in this encounter.    *If you need refills on other medications prior to your next appointment, please contact your pharmacy*  Follow-Up: Call back or seek an in-person evaluation if the symptoms worsen or if the condition fails  to improve as anticipated.  Other Instructions She is aware this a one time refill only of buspar, buspirone, fluoxetine and quetiapine.  Recommended she reach out to her Behavioral health therapist and schedule virtual visit if she is having transportation issues.    If you have been instructed to have an in-person evaluation today at a local Urgent Care facility, please use the link below. It will take you to a list of all of our available [facility], including address, phone number and hours of operation. Please do not delay care.  [facility]  If you or a family  member do not have a primary care provider, use the link below to schedule a visit and establish care. When you choose a [facility] primary care physician or advanced practice provider, you gain a long-term partner in health. Find a Primary Care Provider  Learn more about [facility]'s in-office and virtual care options: [facility] - Get Care Now

## 2021-12-08 ENCOUNTER — Other Ambulatory Visit: Payer: Self-pay | Admitting: *Deleted

## 2021-12-08 DIAGNOSIS — G43709 Chronic migraine without aura, not intractable, without status migrainosus: Secondary | ICD-10-CM

## 2021-12-08 MED ORDER — UBRELVY 100 MG PO TABS: 100.0000 mg | ORAL_TABLET | Freq: Every day | ORAL | 5 refills | Status: DC | PRN

## 2021-12-12 ENCOUNTER — Telehealth (INDEPENDENT_AMBULATORY_CARE_PROVIDER_SITE_OTHER): Payer: Medicare Other | Admitting: Physician Assistant

## 2021-12-12 DIAGNOSIS — F411 Generalized anxiety disorder: Secondary | ICD-10-CM

## 2021-12-12 DIAGNOSIS — F332 Major depressive disorder, recurrent severe without psychotic features: Secondary | ICD-10-CM

## 2021-12-12 DIAGNOSIS — G4700 Insomnia, unspecified: Secondary | ICD-10-CM

## 2021-12-13 ENCOUNTER — Encounter (HOSPITAL_COMMUNITY): Payer: Self-pay | Admitting: Physician Assistant

## 2021-12-13 ENCOUNTER — Telehealth: Payer: Self-pay | Admitting: *Deleted

## 2021-12-13 MED ORDER — QUETIAPINE FUMARATE 100 MG PO TABS: 100.0000 mg | ORAL_TABLET | Freq: Every day | ORAL | 2 refills | Status: DC

## 2021-12-13 MED ORDER — BUPROPION HCL ER (XL) 300 MG PO TB24: 300.0000 mg | ORAL_TABLET | Freq: Every day | ORAL | 2 refills | Status: DC

## 2021-12-13 MED ORDER — FLUOXETINE HCL 40 MG PO CAPS: 80.0000 mg | ORAL_CAPSULE | Freq: Every morning | ORAL | 2 refills | Status: DC

## 2021-12-13 MED ORDER — BUSPIRONE HCL 30 MG PO TABS: 30.0000 mg | ORAL_TABLET | Freq: Two times a day (BID) | ORAL | 2 refills | Status: DC

## 2021-12-13 NOTE — Telephone Encounter (Signed)
Submitted PA Ubrelvy on [facility]. Key: [id number]. Received instant approval: "CaseId:[id number];Status:Approved;Review Type:Prior Auth;Coverage Start Date:11/13/2021;Coverage End Date:12/13/2022;"

## 2021-12-13 NOTE — Progress Notes (Signed)
BH MD/PA/NP OP Progress Note  Virtual Visit via Video Note  I connected with Tracy Mcconnell on 12/13/21 at  5:00 PM EDT by a video enabled telemedicine application and verified that I am speaking with the correct person using two identifiers.  Location: Patient: Home Provider: Clinic   I discussed the limitations of evaluation and management by telemedicine and the availability of in person appointments. The patient expressed understanding and agreed to proceed.  Follow Up Instructions:   I discussed the assessment and treatment plan with the patient. The patient was provided an opportunity to ask questions and all were answered. The patient agreed with the plan and demonstrated an understanding of the instructions.   The patient was advised to call back or seek an in-person evaluation if the symptoms worsen or if the condition fails to improve as anticipated.  I provided 11 minutes of non-face-to-face time during this encounter.  Meta Hatchet, PA   12/13/2021 5:52 AM Tracy Mcconnell  MRN:  [id number]  Chief Complaint:  Chief Complaint   Follow-up; Medication Refill     HPI:   Tracy Mcconnell is a 26 year old female with a past psychiatric history significant for generalized anxiety disorder, major depressive disorder, and insomnia who presents to [facility] for follow-up and medication management.  Patient was last seen by this provider on 12/07/2020.  During her last encounter, patient was being managed on the following medications:  Bupropion (Wellbutrin XL) 300 mg 24-hour tablet daily Buspirone 30 mg 2 times daily Fluoxetine 80 mg daily Seroquel 100 mg at bedtime  Patient is currently taking her medications as prescribed.  Patient denies experiencing any adverse side effects from her medications at this time.  Patient endorses depressive symptoms 1 time per week characterized by inability to eat,  fatigue, and difficulty sleeping.  Patient also endorses anxiety and rates her anxiety an 8 out of 10.  Patient's current stressors include finding a new place to live and finding her car.  Patient states that she feels stuck in life and is currently living with her ex-boyfriend and his mother.  A PHQ-9 screen was performed with the patient scoring a 13.  A GAD-7 screen was also performed with the patient scoring a 12.  Patient is alert and oriented x 4, calm, cooperative, and fully engaged in conversation during the encounter.  Patient endorses neutral mood stating that she is not feeling bad but she is not feeling great either.  Patient denies suicidal or homicidal ideations.  She further denies auditory or visual hallucinations and does not appear to be responding to internal/external stimuli.  Patient endorses fair sleep and receives on average 6 hours of sleep each night.  Patient endorses fair appetite and eats on average 2 meals per day.  Patient denies alcohol consumption and tobacco use.  Patient endorses illicit drug use in the form of marijuana.  Visit Diagnosis:    ICD-10-CM   1. Generalized anxiety disorder  F41.1 FLUoxetine (PROZAC) 40 MG capsule    busPIRone (BUSPAR) 30 MG tablet    2. Severe episode of recurrent major depressive disorder, without psychotic features (HCC)  F33.2 FLUoxetine (PROZAC) 40 MG capsule    buPROPion (WELLBUTRIN XL) 300 MG 24 hr tablet    QUEtiapine (SEROQUEL) 100 MG tablet    3. Insomnia, unspecified type  G47.00       Past Psychiatric History:  Generalized anxiety disorder OCD Depression Sleep disturbances  Past Medical History:  Past  Medical History:  Diagnosis Date   Anemia    Anxiety    Depression    GERD (gastroesophageal reflux disease)    Hypovitaminosis D 04/07/2019   Left eye complaint 06/08/2019   Migraines    MS (multiple sclerosis) (HCC)     Past Surgical History:  Procedure Laterality Date   CHOLECYSTECTOMY N/A 10/19/2019    Procedure: LAPAROSCOPIC CHOLECYSTECTOMY;  Surgeon: Berna Bue, MD;  Location: [facility];  Service: General;  Laterality: N/A;   LAPAROSCOPIC APPENDECTOMY N/A 01/17/2020   Procedure: APPENDECTOMY LAPAROSCOPIC;  Surgeon: Harriette Bouillon, MD;  Location: [facility];  Service: General;  Laterality: N/A;    Family Psychiatric History:  Mother - bipolar depression, PTSD  Family History:  Family History  Problem Relation Age of Onset   Migraines Mother    Thyroid disease Mother    Cancer Maternal Grandmother    Hypertension Maternal Grandfather    Rheum arthritis Paternal Grandmother    Diabetes Paternal Grandmother    Stroke Paternal Grandmother    Diabetes Paternal Grandfather     Social History:  Social History   Socioeconomic History   Marital status: Single    Spouse name: Not on file   Number of children: 0   Years of education: college student   Highest education level: Not on file  Occupational History   Not on file  Tobacco Use   Smoking status: Never   Smokeless tobacco: Never  Vaping Use   Vaping Use: Never used  Substance and Sexual Activity   Alcohol use: Not Currently   Drug use: No   Sexual activity: Not Currently  Other Topics Concern   Not on file  Social History Narrative   Lives at home alone   Right handed   Does not drink caffeine, makes her nauseated   Social Determinants of Health   Financial Resource Strain: Not on file  Food Insecurity: Not on file  Transportation Needs: No Transportation Needs (02/04/2017)   PRAPARE - Administrator, Civil Service (Medical): No    Lack of Transportation (Non-Medical): No  Physical Activity: Not on file  Stress: Not on file  Social Connections: Not on file    Allergies:  Allergies  Allergen Reactions   Sumatriptan Shortness Of Breath and Other (See Comments)    Entire body "felt heavy"   Other Itching, Swelling and Other (See Comments)    Pesticides (family history)- Exposure face and mouth  itch and the tongue/lips swell    Metabolic Disorder Labs: Lab Results  Component Value Date   HGBA1C 5.2 09/11/2020   MPG 103 09/11/2020   MPG 100 09/09/2020   Lab Results  Component Value Date   PROLACTIN 36.5 (H) 09/09/2020   Lab Results  Component Value Date   CHOL 192 09/09/2020   TRIG 68 09/09/2020   HDL 50 09/09/2020   CHOLHDL 3.8 09/09/2020   VLDL 14 09/09/2020   LDLCALC 128 (H) 09/09/2020   Lab Results  Component Value Date   TSH 0.88 08/23/2021   TSH 0.91 06/21/2021    Therapeutic Level Labs: No results found for: "LITHIUM" No results found for: "VALPROATE" No results found for: "CBMZ"  Current Medications: Current Outpatient Medications  Medication Sig Dispense Refill   acetaminophen (TYLENOL) 325 MG tablet Take 2 tablets (650 mg total) by mouth every 6 (six) hours as needed for mild pain (or temp > 100).     buPROPion (WELLBUTRIN XL) 300 MG 24 hr tablet Take 1 tablet (  300 mg total) by mouth daily. 30 tablet 2   busPIRone (BUSPAR) 30 MG tablet Take 1 tablet (30 mg total) by mouth in the morning and at bedtime. 60 tablet 2   Dimethyl Fumarate (TECFIDERA) 240 MG CPDR Take 1 capsule (240 mg total) by mouth in the morning and at bedtime. 180 capsule 3   FLUoxetine (PROZAC) 40 MG capsule Take 2 capsules (80 mg total) by mouth in the morning. 60 capsule 2   gabapentin (NEURONTIN) 600 MG tablet Take 1 tablet (600 mg total) by mouth 4 (four) times daily. 120 tablet 0   hydrOXYzine (ATARAX/VISTARIL) 25 MG tablet Take 1 tablet (25 mg total) by mouth 3 (three) times daily as needed for anxiety. 30 tablet 1   loperamide (IMODIUM) 2 MG capsule Take 1 capsule (2 mg total) by mouth as needed for diarrhea or loose stools. 30 capsule 0   metroNIDAZOLE (FLAGYL) 500 MG tablet Take 1 tablet (500 mg total) by mouth 2 (two) times daily. 14 tablet 0   ondansetron (ZOFRAN ODT) 4 MG disintegrating tablet Take 1 tablet (4 mg total) by mouth every 8 (eight) hours as needed for nausea or  vomiting. 20 tablet 0   ondansetron (ZOFRAN) 4 MG tablet Take 1 tablet (4 mg total) by mouth every 8 (eight) hours as needed for nausea or vomiting. 20 tablet 0   pantoprazole (PROTONIX) 40 MG tablet Take 1 tablet (40 mg total) by mouth daily. 30 tablet 3   phenazopyridine (PYRIDIUM) 200 MG tablet Take 1 tablet (200 mg total) by mouth 3 (three) times daily as needed for pain. 15 tablet 0   QUEtiapine (SEROQUEL) 100 MG tablet Take 1 tablet (100 mg total) by mouth at bedtime. 30 tablet 2   sucralfate (CARAFATE) 1 GM/10ML suspension Take 10 mLs (1 g total) by mouth 4 (four) times daily -  with meals and at bedtime. 420 mL 0   Ubrogepant (UBRELVY) 100 MG TABS Take 100 mg by mouth daily as needed. Take one tablet at onset of headache, may repeat 1 tablet in 2 hours, no more than 2 tablets in 24 hours 10 tablet 5   No current facility-administered medications for this visit.     Musculoskeletal: Strength & Muscle Tone: Unable to assess due to telemedicine visit Gait & Station: Unable to assess due to telemedicine visit Patient leans: Unable to assess due to telemedicine visit  Psychiatric Specialty Exam: Review of Systems  Psychiatric/Behavioral:  Positive for sleep disturbance. Negative for decreased concentration, dysphoric mood, hallucinations, self-injury and suicidal ideas. The patient is nervous/anxious. The patient is not hyperactive.     There were no vitals taken for this visit.There is no height or weight on file to calculate BMI.  General Appearance: Unable to assess due to telemedicine visit  Eye Contact:  Unable to assess due to telemedicine visit  Speech:  Clear and Coherent and Normal Rate  Volume:  Normal  Mood:  Anxious and Euthymic  Affect:  Appropriate and Congruent  Thought Process:  Coherent, Goal Directed, and Descriptions of Associations: Intact  Orientation:  Full (Time, Place, and Person)  Thought Content: WDL   Suicidal Thoughts:  No  Homicidal Thoughts:  No   Memory:  Immediate;   Good Recent;   Good Remote;   Good  Judgement:  Good  Insight:  Good  Psychomotor Activity:  Normal  Concentration:  Concentration: Good and Attention Span: Good  Recall:  Good  Fund of Knowledge: Good  Language: Good  Akathisia:  NA  Handed:  Right  AIMS (if indicated): not done  Assets:  Communication Skills Desire for Improvement Housing Social Support  ADL's:  Intact  Cognition: WNL  Sleep:  Fair   Screenings: AUDIT    Flowsheet Row [facility]  Alcohol Use Disorder Identification Test Final Score (AUDIT) 0      GAD-7    Flowsheet Row Video Visit from 12/12/2021 in [facility] Video Visit from 12/07/2020 in [facility] Office Visit from 10/14/2020 in [facility] Office Visit from 11/11/2019 in Primary Care at [facility] Visit from 06/17/2019 in Primary Care at [facility]  Total GAD-7 Score 12 16 19 8  0      PHQ2-9    Flowsheet Row Video Visit from 12/12/2021 in [facility] Office Visit from 08/23/2021 in [facility] Office Visit from 06/21/2021 in [facility] Video Visit from 12/07/2020 in [facility] Office Visit from 10/14/2020 in [place]  PHQ-2 Total Score 2 4 5 2 4   PHQ-9 Total Score 13 15 18 13 20       Flowsheet Row Video Visit from 12/12/2021 in [facility] ED from 09/19/2021 in [facility] ED from 08/30/2021 in [facility]  C-SSRS RISK CATEGORY Low Risk No Risk No Risk        Assessment and Plan:   Jaszmine Matayah Reyburn is a 26 year old female with a past psychiatric history significant for generalized anxiety disorder,  major depressive disorder, and insomnia who presents to [facility] for follow-up and medication management.  Patient was last seen by this provider on 12/07/2020.  Patient is continuing to take her medications as prescribed but continues to endorse some mild depression and anxiety.  Prior to not being seen by this provider, patient was originally taking clonazepam 0.5 mg 2 times daily as needed for the management of her anxiety.  Due to patient not being seen for some time, provider to place patient on a 30-day supply of clonazepam 0.25 mg 2 times daily as needed for the management of her anxiety.  Patient was agreeable to recommendation.  Patient's medications to be e-prescribed to pharmacy of choice.  Collaboration of Care: Collaboration of Care: Medication Management AEB provider managing patient's psychiatric medications, Primary Care Provider AEB patient being seen by a primary care provider, and Psychiatrist AEB patient being seen by a mental health provider  Patient/Guardian was advised Release of Information must be obtained prior to any record release in order to collaborate their care with an outside provider. Patient/Guardian was advised if they have not already done so to contact the registration department to sign all necessary forms in order for 30 to release information regarding their care.   Consent: Patient/Guardian gives verbal consent for treatment and assignment of benefits for services provided during this visit. Patient/Guardian expressed understanding and agreed to proceed.   1. Generalized anxiety disorder  - FLUoxetine (PROZAC) 40 MG capsule; Take 2 capsules (80 mg total) by mouth in the morning.  Dispense: 60 capsule; Refill: 2 - busPIRone (BUSPAR) 30 MG tablet; Take 1 tablet (30 mg total) by mouth in the morning and at bedtime.  Dispense: 60 tablet; Refill: 2  2. Severe episode of recurrent major depressive disorder, without psychotic  features (HCC)  - FLUoxetine (PROZAC) 40 MG capsule; Take 2 capsules (80 mg total) by mouth in the morning.  Dispense: 60 capsule; Refill: 2 - buPROPion (WELLBUTRIN XL) 300 MG 24 hr tablet; Take 1 tablet (300 mg total) by mouth daily.  Dispense: 30 tablet; Refill: 2 - QUEtiapine (SEROQUEL) 100 MG tablet; Take 1 tablet (100 mg total) by mouth at bedtime.  Dispense: 30 tablet; Refill: 2  3. Insomnia, unspecified type   Patient to follow up in 2 months Provider spent a total of 11 minutes with the patient/reviewing patient chart  Meta Hatchet, PA 12/13/2021, 5:52 AM

## 2021-12-28 NOTE — Patient Instructions (Signed)
Below is our plan:  We will continue current treatment plan.   Please make sure you are staying well hydrated. I recommend 50-60 ounces daily. Well balanced diet and regular exercise encouraged. Consistent sleep schedule with 6-8 hours recommended.   Please continue follow up with care team as directed.   Follow up with Dr Sater in 6 months   You may receive a survey regarding today's visit. I encourage you to leave honest feed back as I do use this information to improve patient care. Thank you for seeing me today!    

## 2021-12-28 NOTE — Progress Notes (Signed)
PATIENT: Tracy Mcconnell DOB: 02/24/1996  REASON FOR VISIT: follow up HISTORY FROM: patient  Chief Complaint  Patient presents with   Follow-up    Pt in room #2 and alone. Pt here today for f/u with migraine.    HISTORY OF PRESENT ILLNESS:  01/01/22 Tracy Mcconnell: Li returns for follow up for MS and migraines. She continues dimethyl fumarate and tolerating well. Labs have been stable. Last imaging 01/2021 stable.   She is doing well. No new or worsening symptoms. No vision changes. Neck pain resolved. She discontinued lamotrigine as pain resolved.   She continues Botox and Ubrelvy for migraine management. She has about 4-8 migraines a month. [name] usually aborts migraine with two doses.   She feels mood is stable. She continues follow up with Karel Jarvis, PA but reports he is leaving practice. She will schedule with new psychiatrist. She is sleeping well. She continues to reside with her ex boyfriend and his mother. She has a driver's license but does not own a car. She denies difficulty driving.    History (copied from Dr Bonnita Hollow previous note)  Tracy Mcconnell is a 26 y.o. woman with left optic neuritis and abnormal brain MRi   Update 06/29/2021 She had a concussion a couple weeks ago, hiting her head on the pullout freezer drawer.    There was no LOc but she had a headache x a few days afterwards.    She is better now.      She is on DMF and tolerates it well.  She has left > right leg weakness.    She has noted she is tripping more.  She notes some left arm > foot numbness with tingling but not that uncomfortable.  She will have a Lhermitte sign sometimes she flexes neck forward.     The 02/25/2021 MRI of the brain and cervical spine showed no acute or new findings.  The previous 2 MRIs did not show any new lesions.      She had mild visual changes with the concussion.  .   She had left ON and occasioanlly she notes left visula blurring.   COlors are desaturated  OS.       Migraines are doing fairly well on Botox.  She is on Ubrelvy when one occurs.   Nurtec works better than [place] but insurance will not cover it.    Bright lights or sunlight triggers a migraine.   Two headaches the past week had N/V.       Multiple sclerosis/ History: In February 2021, she had the onset of left eye blurry vision and pain, worse with eye movements.    She saw urgent care and then ophthalmology   She was felt to have optic neuritis and presented to the [facility] ED.   MRI of the brain and orbits showed enhancement of the left optic nerve,   There were a few T2 hyperintense foci in the hemispheres, including a periventricular focus in the right frontal lobe and one juxtacortical focus in the right parietal lobe.  None of the brain foci enhanced.  In retrospect, she has numbness and weakness in her legs that lasted a week, left > right in November 2019.   She had an LP 07/01/2019 and CSF showed no OCB and normal IgG Index    Imaging: MRI of the brain 10/15/2020 was unchanged   MRI cervical spine 10/15/2020 was normal   MRI of the brain 05/23/2020 showed scattered T2/FLAIR hyperintense foci  including 2 in the periventricular white matter and 1 in the juxtacortical white matter.  None of the foci enhance.  Compared to the MRI of the brain from 12/24/2019 and 06/10/2019, there are no new lesions.   MRI of the cervical spine 05/23/2020 showed a normal spinal cord.  No degenerative changes.   MRI of the orbits 05/23/2020 showed no enhancing lesions.  There was slight hyperintensity of the left optic nerve consistent with the previous optic neuritis.   Her maternal aunt and two second cousins have MS.  REVIEW OF SYSTEMS: Out of a complete 14 system review of symptoms, the patient complains only of the following symptoms, headaches, anxiety, depression  and Tracy Mcconnell other reviewed systems are negative.  ALLERGIES: Allergies  Allergen Reactions   Sumatriptan Shortness Of Breath and  Other (See Comments)    Entire body "felt heavy"   Other Itching, Swelling and Other (See Comments)    Pesticides (family history)- Exposure face and mouth itch and the tongue/lips swell    HOME MEDICATIONS: Outpatient Medications Prior to Visit  Medication Sig Dispense Refill   acetaminophen (TYLENOL) 325 MG tablet Take 2 tablets (650 mg total) by mouth every 6 (six) hours as needed for mild pain (or temp > 100).     buPROPion (WELLBUTRIN XL) 300 MG 24 hr tablet Take 1 tablet (300 mg total) by mouth daily. 30 tablet 2   busPIRone (BUSPAR) 30 MG tablet Take 1 tablet (30 mg total) by mouth in the morning and at bedtime. 60 tablet 2   Dimethyl Fumarate (TECFIDERA) 240 MG CPDR Take 1 capsule (240 mg total) by mouth in the morning and at bedtime. 180 capsule 3   FLUoxetine (PROZAC) 40 MG capsule Take 2 capsules (80 mg total) by mouth in the morning. 60 capsule 2   hydrOXYzine (ATARAX/VISTARIL) 25 MG tablet Take 1 tablet (25 mg total) by mouth 3 (three) times daily as needed for anxiety. 30 tablet 1   loperamide (IMODIUM) 2 MG capsule Take 1 capsule (2 mg total) by mouth as needed for diarrhea or loose stools. 30 capsule 0   metroNIDAZOLE (FLAGYL) 500 MG tablet Take 1 tablet (500 mg total) by mouth 2 (two) times daily. 14 tablet 0   ondansetron (ZOFRAN ODT) 4 MG disintegrating tablet Take 1 tablet (4 mg total) by mouth every 8 (eight) hours as needed for nausea or vomiting. 20 tablet 0   ondansetron (ZOFRAN) 4 MG tablet Take 1 tablet (4 mg total) by mouth every 8 (eight) hours as needed for nausea or vomiting. 20 tablet 0   pantoprazole (PROTONIX) 40 MG tablet Take 1 tablet (40 mg total) by mouth daily. 30 tablet 3   phenazopyridine (PYRIDIUM) 200 MG tablet Take 1 tablet (200 mg total) by mouth 3 (three) times daily as needed for pain. 15 tablet 0   QUEtiapine (SEROQUEL) 100 MG tablet Take 1 tablet (100 mg total) by mouth at bedtime. 30 tablet 2   sucralfate (CARAFATE) 1 GM/10ML suspension Take 10  mLs (1 g total) by mouth 4 (four) times daily -  with meals and at bedtime. 420 mL 0   Ubrogepant (UBRELVY) 100 MG TABS Take 100 mg by mouth daily as needed. Take one tablet at onset of headache, may repeat 1 tablet in 2 hours, no more than 2 tablets in 24 hours 10 tablet 5   gabapentin (NEURONTIN) 600 MG tablet Take 1 tablet (600 mg total) by mouth 4 (four) times daily. 120 tablet 0   No  facility-administered medications prior to visit.    PAST MEDICAL HISTORY: Past Medical History:  Diagnosis Date   Anemia    Anxiety    Depression    GERD (gastroesophageal reflux disease)    Hypovitaminosis D 04/07/2019   Left eye complaint 06/08/2019   Migraines    MS (multiple sclerosis) ([place])     PAST SURGICAL HISTORY: Past Surgical History:  Procedure Laterality Date   CHOLECYSTECTOMY N/A 10/19/2019   Procedure: LAPAROSCOPIC CHOLECYSTECTOMY;  Surgeon: Clovis Riley, MD;  Location: [facility];  Service: General;  Laterality: N/A;   LAPAROSCOPIC APPENDECTOMY N/A 01/17/2020   Procedure: APPENDECTOMY LAPAROSCOPIC;  Surgeon: Erroll Luna, MD;  Location: [facility];  Service: General;  Laterality: N/A;    FAMILY HISTORY: Family History  Problem Relation Age of Onset   Migraines Mother    Thyroid disease Mother    Cancer Maternal Grandmother    Hypertension Maternal Grandfather    Rheum arthritis Paternal Grandmother    Diabetes Paternal Grandmother    Stroke Paternal Grandmother    Diabetes Paternal Grandfather     SOCIAL HISTORY: Social History   Socioeconomic History   Marital status: Single    Spouse name: Not on file   Number of children: 0   Years of education: college student   Highest education level: Not on file  Occupational History   Not on file  Tobacco Use   Smoking status: Never   Smokeless tobacco: Never  Vaping Use   Vaping Use: Never used  Substance and Sexual Activity   Alcohol use: Not Currently   Drug use: No   Sexual activity: Not Currently  Other Topics  Concern   Not on file  Social History Narrative   Lives at home alone   Right handed   Does not drink caffeine, makes her nauseated   Social Determinants of Health   Financial Resource Strain: Not on file  Food Insecurity: Not on file  Transportation Needs: No Transportation Needs (02/04/2017)   PRAPARE - Hydrologist (Medical): No    Lack of Transportation (Non-Medical): No  Physical Activity: Not on file  Stress: Not on file  Social Connections: Not on file  Intimate Partner Violence: Not on file      PHYSICAL EXAM  Vitals:   01/01/22 1454  BP: 99/65  Pulse: 79  Weight: 95 lb (43.1 kg)  Height: 5' (1.524 m)    Body mass index is 18.55 kg/m.  Generalized: Well developed, in no acute distress  Cardiology: normal rate and rhythm, no murmur noted Neurological examination  Mentation: Alert oriented to time, place, history taking. Follows Tracy Mcconnell commands speech and language fluent Cranial nerve II-XII: Pupils were equal round reactive to light. Extraocular movements were full, visual field were full on confrontational test. Facial sensation and strength were normal. Uvula tongue midline. Head turning and shoulder shrug  were normal and symmetric. Motor: The motor testing reveals 5 over 5 strength of Tracy Mcconnell 4 extremities. Good symmetric motor tone is noted throughout.  Gait and station: Gait is normal.   DIAGNOSTIC DATA (LABS, IMAGING, TESTING) - I reviewed patient records, labs, notes, testing and imaging myself where available.      No data to display           Lab Results  Component Value Date   WBC 8.5 08/23/2021   HGB 13.9 08/23/2021   HCT 41.4 08/23/2021   MCV 88.8 08/23/2021   PLT 387.0 08/23/2021  Component Value Date/Time   NA 139 08/23/2021 1059   NA 140 12/28/2019 1655   K 3.5 08/23/2021 1059   CL 101 08/23/2021 1059   CO2 29 08/23/2021 1059   GLUCOSE 86 08/23/2021 1059   BUN 8 08/23/2021 1059   BUN 9 12/28/2019  1655   CREATININE 0.62 08/23/2021 1059   CALCIUM 9.9 08/23/2021 1059   PROT 7.1 08/23/2021 1059   PROT 7.5 12/28/2019 1655   ALBUMIN 4.5 08/23/2021 1059   ALBUMIN 4.9 12/28/2019 1655   ALBUMIN 4.2 07/01/2019 0919   AST 14 08/23/2021 1059   ALT 10 08/23/2021 1059   ALKPHOS 68 08/23/2021 1059   BILITOT 0.4 08/23/2021 1059   BILITOT 0.3 12/28/2019 1655   GFRNONAA >60 02/24/2021 2319   GFRAA 138 12/28/2019 1655   Lab Results  Component Value Date   CHOL 192 09/09/2020   HDL 50 09/09/2020   LDLCALC 128 (H) 09/09/2020   TRIG 68 09/09/2020   CHOLHDL 3.8 09/09/2020   Lab Results  Component Value Date   HGBA1C 5.2 09/11/2020   Lab Results  Component Value Date   VITAMINB12 553 06/17/2019   Lab Results  Component Value Date   TSH 0.88 08/23/2021    ASSESSMENT AND PLAN 26 y.o. year old female  has a past medical history of Anemia, Anxiety, Depression, GERD (gastroesophageal reflux disease), Hypovitaminosis D (04/07/2019), Left eye complaint (06/08/2019), Migraines, and MS (multiple sclerosis) ([place]). here with     ICD-10-CM   1. Multiple sclerosis ([place])  G35 CBC with Differential/Platelets    Hepatic Function Panel    2. Chronic migraine without aura without status migrainosus, not intractable  G43.709 Ubrogepant (UBRELVY) 100 MG TABS    3. History of optic neuritis  Z86.69     4. High risk medication use  Z79.899        Lasheka is doing well on dimethyl fumarate. We will continue current therapy.  She will continue Botox for migraine prevention and Ubrelvy for abortive therapy. She will continue close follow up with psychiatry, ophthalmology and PCP. Adequate hydration and regular exercise advised. She will return to see me in 6 months. She verbalizes understanding and agreement with this plan.   Orders Placed This Encounter  Procedures   CBC with Differential/Platelets   Hepatic Function Panel     Meds ordered this encounter  Medications   Ubrogepant (UBRELVY) 100  MG TABS    Sig: Take 100 mg by mouth daily as needed. Take one tablet at onset of headache, may repeat 1 tablet in 2 hours, no more than 2 tablets in 24 hours    Dispense:  24 tablet    Refill:  3    90 day supply if possible    Order Specific Question:   Supervising Provider    Answer:   Melvenia Beam [[id number]]      Bryton Waight Ubaldo Glassing, FNP-C 01/01/2022, 3:35 PM [facility] [address] [place], [place] [address] [phone]

## 2022-01-01 ENCOUNTER — Encounter: Payer: Self-pay | Admitting: Family Medicine

## 2022-01-01 ENCOUNTER — Ambulatory Visit (INDEPENDENT_AMBULATORY_CARE_PROVIDER_SITE_OTHER): Payer: Medicare Other | Admitting: Family Medicine

## 2022-01-01 VITALS — BP 99/65 | HR 79 | Ht 60.0 in | Wt 95.0 lb

## 2022-01-01 DIAGNOSIS — G35 Multiple sclerosis: Secondary | ICD-10-CM

## 2022-01-01 DIAGNOSIS — G43709 Chronic migraine without aura, not intractable, without status migrainosus: Secondary | ICD-10-CM

## 2022-01-01 DIAGNOSIS — Z8669 Personal history of other diseases of the nervous system and sense organs: Secondary | ICD-10-CM | POA: Diagnosis not present

## 2022-01-01 DIAGNOSIS — Z79899 Other long term (current) drug therapy: Secondary | ICD-10-CM | POA: Diagnosis not present

## 2022-01-01 MED ORDER — UBRELVY 100 MG PO TABS: 100.0000 mg | ORAL_TABLET | Freq: Every day | ORAL | 3 refills | Status: DC | PRN

## 2022-01-02 LAB — CBC WITH DIFFERENTIAL/PLATELET
Basophils Absolute: 0.1 10*3/uL (ref 0.0–0.2)
Basos: 1 %
EOS (ABSOLUTE): 0.1 10*3/uL (ref 0.0–0.4)
Eos: 1 %
Hematocrit: 36.6 % (ref 34.0–46.6)
Hemoglobin: 12.4 g/dL (ref 11.1–15.9)
Immature Grans (Abs): 0 10*3/uL (ref 0.0–0.1)
Immature Granulocytes: 0 %
Lymphocytes Absolute: 2.5 10*3/uL (ref 0.7–3.1)
Lymphs: 30 %
MCH: 31 pg (ref 26.6–33.0)
MCHC: 33.9 g/dL (ref 31.5–35.7)
MCV: 92 fL (ref 79–97)
Monocytes Absolute: 0.6 10*3/uL (ref 0.1–0.9)
Monocytes: 7 %
Neutrophils Absolute: 5.1 10*3/uL (ref 1.4–7.0)
Neutrophils: 61 %
Platelets: 317 10*3/uL (ref 150–450)
RBC: 4 x10E6/uL (ref 3.77–5.28)
RDW: 12.5 % (ref 11.7–15.4)
WBC: 8.3 10*3/uL (ref 3.4–10.8)

## 2022-01-02 LAB — HEPATIC FUNCTION PANEL
ALT: 7 IU/L (ref 0–32)
AST: 17 IU/L (ref 0–40)
Albumin: 4.6 g/dL (ref 4.0–5.0)
Alkaline Phosphatase: 67 IU/L (ref 44–121)
Bilirubin Total: <0.2 mg/dL (ref 0.0–1.2)
Bilirubin, Direct: <0.1 mg/dL (ref 0.00–0.40)
Total Protein: 7.2 g/dL (ref 6.0–8.5)

## 2022-01-04 ENCOUNTER — Ambulatory Visit (INDEPENDENT_AMBULATORY_CARE_PROVIDER_SITE_OTHER): Payer: Medicare Other

## 2022-01-04 ENCOUNTER — Ambulatory Visit
Admission: EM | Admit: 2022-01-04 | Discharge: 2022-01-04 | Disposition: A | Discharge status: Home / Self Care | Payer: Medicare Other | Attending: Urgent Care | Admitting: Urgent Care

## 2022-01-04 DIAGNOSIS — G35 Multiple sclerosis: Secondary | ICD-10-CM | POA: Diagnosis not present

## 2022-01-04 DIAGNOSIS — R200 Anesthesia of skin: Secondary | ICD-10-CM

## 2022-01-04 DIAGNOSIS — M25511 Pain in right shoulder: Secondary | ICD-10-CM | POA: Diagnosis not present

## 2022-01-04 DIAGNOSIS — M5412 Radiculopathy, cervical region: Secondary | ICD-10-CM

## 2022-01-04 DIAGNOSIS — G35D Multiple sclerosis, unspecified: Secondary | ICD-10-CM

## 2022-01-04 MED ORDER — PREDNISONE 50 MG PO TABS: 50.0000 mg | ORAL_TABLET | Freq: Every day | ORAL | 0 refills | Status: DC

## 2022-01-04 MED ORDER — TIZANIDINE HCL 4 MG PO TABS: 4.0000 mg | ORAL_TABLET | Freq: Every day | ORAL | 0 refills | Status: DC

## 2022-01-04 NOTE — ED Triage Notes (Signed)
Pt states right shoulder pain denies injury.

## 2022-01-04 NOTE — ED Provider Notes (Signed)
[name]  Note:  This document was prepared using Conservation officer, historic buildings and may include unintentional dictation errors.  MRN: [id number] DOB: 1996-01-19  Subjective:   Tracy Mcconnell is a 26 y.o. female presenting for 1 day history of right shoulder pain that progressively worsened throughout the day. Has had more numbness and tingling of the lateral aspect of her right upper arm today, neck pain.  No fall, trauma, weakness.  Patient has a history of multiple sclerosis, myalgia, numbness and tingling.  She did have an MRI that was done November 2022.  No current facility-administered medications for this encounter.  Current Outpatient Medications:    acetaminophen (TYLENOL) 325 MG tablet, Take 2 tablets (650 mg total) by mouth every 6 (six) hours as needed for mild pain (or temp > 100)., Disp: , Rfl:    buPROPion (WELLBUTRIN XL) 300 MG 24 hr tablet, Take 1 tablet (300 mg total) by mouth daily., Disp: 30 tablet, Rfl: 2   busPIRone (BUSPAR) 30 MG tablet, Take 1 tablet (30 mg total) by mouth in the morning and at bedtime., Disp: 60 tablet, Rfl: 2   Dimethyl Fumarate (TECFIDERA) 240 MG CPDR, Take 1 capsule (240 mg total) by mouth in the morning and at bedtime., Disp: 180 capsule, Rfl: 3   FLUoxetine (PROZAC) 40 MG capsule, Take 2 capsules (80 mg total) by mouth in the morning., Disp: 60 capsule, Rfl: 2   gabapentin (NEURONTIN) 600 MG tablet, Take 1 tablet (600 mg total) by mouth 4 (four) times daily., Disp: 120 tablet, Rfl: 0   hydrOXYzine (ATARAX/VISTARIL) 25 MG tablet, Take 1 tablet (25 mg total) by mouth 3 (three) times daily as needed for anxiety., Disp: 30 tablet, Rfl: 1   loperamide (IMODIUM) 2 MG capsule, Take 1 capsule (2 mg total) by mouth as needed for diarrhea or loose stools., Disp: 30 capsule, Rfl: 0   metroNIDAZOLE (FLAGYL) 500 MG tablet, Take 1 tablet (500 mg total) by mouth 2 (two) times daily., Disp: 14 tablet, Rfl: 0   ondansetron  (ZOFRAN ODT) 4 MG disintegrating tablet, Take 1 tablet (4 mg total) by mouth every 8 (eight) hours as needed for nausea or vomiting., Disp: 20 tablet, Rfl: 0   ondansetron (ZOFRAN) 4 MG tablet, Take 1 tablet (4 mg total) by mouth every 8 (eight) hours as needed for nausea or vomiting., Disp: 20 tablet, Rfl: 0   pantoprazole (PROTONIX) 40 MG tablet, Take 1 tablet (40 mg total) by mouth daily., Disp: 30 tablet, Rfl: 3   phenazopyridine (PYRIDIUM) 200 MG tablet, Take 1 tablet (200 mg total) by mouth 3 (three) times daily as needed for pain., Disp: 15 tablet, Rfl: 0   QUEtiapine (SEROQUEL) 100 MG tablet, Take 1 tablet (100 mg total) by mouth at bedtime., Disp: 30 tablet, Rfl: 2   sucralfate (CARAFATE) 1 GM/10ML suspension, Take 10 mLs (1 g total) by mouth 4 (four) times daily -  with meals and at bedtime., Disp: 420 mL, Rfl: 0   Ubrogepant (UBRELVY) 100 MG TABS, Take 100 mg by mouth daily as needed. Take one tablet at onset of headache, may repeat 1 tablet in 2 hours, no more than 2 tablets in 24 hours, Disp: 24 tablet, Rfl: 3   Allergies  Allergen Reactions   Sumatriptan Shortness Of Breath and Other (See Comments)    Entire body "felt heavy"   Other Itching, Swelling and Other (See Comments)    Pesticides (family history)- Exposure face and mouth itch  and the tongue/lips swell    Past Medical History:  Diagnosis Date   Anemia    Anxiety    Depression    GERD (gastroesophageal reflux disease)    Hypovitaminosis D 04/07/2019   Left eye complaint 06/08/2019   Migraines    MS (multiple sclerosis) ([place])      Past Surgical History:  Procedure Laterality Date   CHOLECYSTECTOMY N/A 10/19/2019   Procedure: LAPAROSCOPIC CHOLECYSTECTOMY;  Surgeon: Clovis Riley, MD;  Location: [facility];  Service: General;  Laterality: N/A;   LAPAROSCOPIC APPENDECTOMY N/A 01/17/2020   Procedure: APPENDECTOMY LAPAROSCOPIC;  Surgeon: Erroll Luna, MD;  Location: [facility];  Service: General;  Laterality: N/A;     Family History  Problem Relation Age of Onset   Migraines Mother    Thyroid disease Mother    Cancer Maternal Grandmother    Hypertension Maternal Grandfather    Rheum arthritis Paternal Grandmother    Diabetes Paternal Grandmother    Stroke Paternal Grandmother    Diabetes Paternal Grandfather     Social History   Tobacco Use   Smoking status: Never   Smokeless tobacco: Never  Vaping Use   Vaping Use: Never used  Substance Use Topics   Alcohol use: Not Currently   Drug use: No    ROS   Objective:   Vitals: BP 96/66 (BP Location: Right Arm)   Pulse (!) 101   Temp 97.7 F (36.5 C) (Oral)   Resp 16   LMP 12/31/2021 (Approximate)   SpO2 97%   Physical Exam Constitutional:      General: She is not in acute distress.    Appearance: Normal appearance. She is well-developed. She is not ill-appearing, toxic-appearing or diaphoretic.  HENT:     Head: Normocephalic and atraumatic.     Nose: Nose normal.     Mouth/Throat:     Mouth: Mucous membranes are moist.  Eyes:     General: No scleral icterus.       Right eye: No discharge.        Left eye: No discharge.     Extraocular Movements: Extraocular movements intact.  Cardiovascular:     Rate and Rhythm: Normal rate.  Pulmonary:     Effort: Pulmonary effort is normal.  Musculoskeletal:     Right shoulder: Tenderness and bony tenderness (AC joint) present. No swelling, deformity, effusion, laceration or crepitus. Normal range of motion. Normal strength.     Cervical back: Spasms present. No swelling, edema, deformity, erythema, signs of trauma, lacerations, rigidity, torticollis, tenderness, bony tenderness or crepitus. Pain with movement present. Normal range of motion.     Comments: Slightly positive Neer's, negative Hawkins.  Skin:    General: Skin is warm and dry.  Neurological:     General: No focal deficit present.     Mental Status: She is alert and oriented to person, place, and time.  Psychiatric:         Mood and Affect: Mood normal.        Behavior: Behavior normal.    DG Shoulder Right  Result Date: 01/04/2022 CLINICAL DATA:  Right shoulder pain.  No known injury. EXAM: RIGHT SHOULDER - 2+ VIEW COMPARISON:  None Available. FINDINGS: There is no evidence of fracture or dislocation. There is no evidence of arthropathy or other focal bone abnormality. Soft tissues are unremarkable. IMPRESSION: Negative. Electronically Signed   By: Marlaine Hind M.D.   On: 01/04/2022 08:49    Assessment and Plan :  PDMP not reviewed this encounter.  1. Cervical radiculopathy   2. Acute pain of right shoulder   3. Numbness   4. Multiple sclerosis ([facility])     Patient has elements of cervical radiculopathy and I discussed general management of this including the use of steroids.  Patient would very much like to use steroids that she has done well with this in the past.  Recommended tizanidine as well.  Follow-up with her neurologist as soon as possible. Counseled patient on potential for adverse effects with medications prescribed/recommended today, ER and return-to-clinic precautions discussed, patient verbalized understanding.    Wallis Bamberg, [place] 01/04/22 [phone]

## 2022-01-08 NOTE — Patient Instructions (Signed)
Below is our plan:  We will repeat MRI brain and cervical spine to look for any concerns of new MS lesions. Please continue supportive care. For any acute worsening seek emergency medical attention.   Please make sure you are staying well hydrated. I recommend 50-60 ounces daily. Well balanced diet and regular exercise encouraged. Consistent sleep schedule with 6-8 hours recommended.   Please continue follow up with care team as directed.   Follow up with me as scheduled for Botox 10/25 and office follow up in 07/2022   You may receive a survey regarding today's visit. I encourage you to leave honest feed back as I do use this information to improve patient care. Thank you for seeing me today!

## 2022-01-08 NOTE — Progress Notes (Unsigned)
PATIENT: Tracy Mcconnell DOB: Aug 19, 1995  REASON FOR VISIT: follow up HISTORY FROM: patient  No chief complaint on file.   HISTORY OF PRESENT ILLNESS:  01/08/22 ALL: Tracy Mcconnell returns for an acute visit for right shoulder pain. She was seen by UC 01/04/2022 with one day history of right shoulder and neck pain and numbness/tingling of lateral aspect of upper arm. She was given prednisone 50mg  x 5 days and tizanidine 4mg  QHS. Since,   01/01/2022 ALL: Tracy Mcconnell returns for follow up for MS and migraines. She continues dimethyl fumarate and tolerating well. Labs have been stable. Last imaging 01/2021 stable.   She is doing well. No new or worsening symptoms. No vision changes. Neck pain resolved. She discontinued lamotrigine as pain resolved.   She continues Botox and Ubrelvy for migraine management. She has about 4-8 migraines a month. Roselyn Meier usually aborts migraine with two doses.   She feels mood is stable. She continues follow up with Ileene Musa, PA but reports he is leaving practice. She will schedule with new psychiatrist. She is sleeping well. She continues to reside with her ex boyfriend and his mother. She has a driver's license but does not own a car. She denies difficulty driving.    History (copied from Dr Garth Bigness previous note)  Tracy Mcconnell is a 26 y.o. woman with left optic neuritis and abnormal brain MRi   Update 06/29/2021 She had a concussion a couple weeks ago, hiting her head on the pullout freezer drawer.    There was no LOc but she had a headache x a few days afterwards.    She is better now.      She is on DMF and tolerates it well.  She has left > right leg weakness.    She has noted she is tripping more.  She notes some left arm > foot numbness with tingling but not that uncomfortable.  She will have a Lhermitte sign sometimes she flexes neck forward.     The 02/25/2021 MRI of the brain and cervical spine showed no acute or new findings.  The  previous 2 MRIs did not show any new lesions.      She had mild visual changes with the concussion.  .   She had left ON and occasioanlly she notes left visula blurring.   COlors are desaturated OS.       Migraines are doing fairly well on Botox.  She is on Ubrelvy when one occurs.   Nurtec works better than [place] but insurance will not cover it.    Bright lights or sunlight triggers a migraine.   Two headaches the past week had N/V.       Multiple sclerosis/ History: In February 2021, she had the onset of left eye blurry vision and pain, worse with eye movements.    She saw urgent care and then ophthalmology   She was felt to have optic neuritis and presented to the [facility] ED.   MRI of the brain and orbits showed enhancement of the left optic nerve,   There were a few T2 hyperintense foci in the hemispheres, including a periventricular focus in the right frontal lobe and one juxtacortical focus in the right parietal lobe.  None of the brain foci enhanced.  In retrospect, she has numbness and weakness in her legs that lasted a week, left > right in November 2019.   She had an LP 07/01/2019 and CSF showed no OCB and normal IgG Index  Imaging: MRI of the brain 10/15/2020 was unchanged   MRI cervical spine 10/15/2020 was normal   MRI of the brain 05/23/2020 showed scattered T2/FLAIR hyperintense foci including 2 in the periventricular white matter and 1 in the juxtacortical white matter.  None of the foci enhance.  Compared to the MRI of the brain from 12/24/2019 and 06/10/2019, there are no new lesions.   MRI of the cervical spine 05/23/2020 showed a normal spinal cord.  No degenerative changes.   MRI of the orbits 05/23/2020 showed no enhancing lesions.  There was slight hyperintensity of the left optic nerve consistent with the previous optic neuritis.   Her maternal aunt and two second cousins have MS.  REVIEW OF SYSTEMS: Out of a complete 14 system review of symptoms, the patient complains  only of the following symptoms, headaches, anxiety, depression  and all other reviewed systems are negative.  ALLERGIES: Allergies  Allergen Reactions   Sumatriptan Shortness Of Breath and Other (See Comments)    Entire body "felt heavy"   Other Itching, Swelling and Other (See Comments)    Pesticides (family history)- Exposure face and mouth itch and the tongue/lips swell    HOME MEDICATIONS: Outpatient Medications Prior to Visit  Medication Sig Dispense Refill   acetaminophen (TYLENOL) 325 MG tablet Take 2 tablets (650 mg total) by mouth every 6 (six) hours as needed for mild pain (or temp > 100).     buPROPion (WELLBUTRIN XL) 300 MG 24 hr tablet Take 1 tablet (300 mg total) by mouth daily. 30 tablet 2   busPIRone (BUSPAR) 30 MG tablet Take 1 tablet (30 mg total) by mouth in the morning and at bedtime. 60 tablet 2   Dimethyl Fumarate (TECFIDERA) 240 MG CPDR Take 1 capsule (240 mg total) by mouth in the morning and at bedtime. 180 capsule 3   FLUoxetine (PROZAC) 40 MG capsule Take 2 capsules (80 mg total) by mouth in the morning. 60 capsule 2   gabapentin (NEURONTIN) 600 MG tablet Take 1 tablet (600 mg total) by mouth 4 (four) times daily. 120 tablet 0   hydrOXYzine (ATARAX/VISTARIL) 25 MG tablet Take 1 tablet (25 mg total) by mouth 3 (three) times daily as needed for anxiety. 30 tablet 1   loperamide (IMODIUM) 2 MG capsule Take 1 capsule (2 mg total) by mouth as needed for diarrhea or loose stools. 30 capsule 0   metroNIDAZOLE (FLAGYL) 500 MG tablet Take 1 tablet (500 mg total) by mouth 2 (two) times daily. 14 tablet 0   ondansetron (ZOFRAN ODT) 4 MG disintegrating tablet Take 1 tablet (4 mg total) by mouth every 8 (eight) hours as needed for nausea or vomiting. 20 tablet 0   ondansetron (ZOFRAN) 4 MG tablet Take 1 tablet (4 mg total) by mouth every 8 (eight) hours as needed for nausea or vomiting. 20 tablet 0   pantoprazole (PROTONIX) 40 MG tablet Take 1 tablet (40 mg total) by mouth  daily. 30 tablet 3   phenazopyridine (PYRIDIUM) 200 MG tablet Take 1 tablet (200 mg total) by mouth 3 (three) times daily as needed for pain. 15 tablet 0   predniSONE (DELTASONE) 50 MG tablet Take 1 tablet (50 mg total) by mouth daily with breakfast. 5 tablet 0   QUEtiapine (SEROQUEL) 100 MG tablet Take 1 tablet (100 mg total) by mouth at bedtime. 30 tablet 2   sucralfate (CARAFATE) 1 GM/10ML suspension Take 10 mLs (1 g total) by mouth 4 (four) times daily -  with meals  and at bedtime. 420 mL 0   tiZANidine (ZANAFLEX) 4 MG tablet Take 1 tablet (4 mg total) by mouth at bedtime. 30 tablet 0   Ubrogepant (UBRELVY) 100 MG TABS Take 100 mg by mouth daily as needed. Take one tablet at onset of headache, may repeat 1 tablet in 2 hours, no more than 2 tablets in 24 hours 24 tablet 3   No facility-administered medications prior to visit.    PAST MEDICAL HISTORY: Past Medical History:  Diagnosis Date   Anemia    Anxiety    Depression    GERD (gastroesophageal reflux disease)    Hypovitaminosis D 04/07/2019   Left eye complaint 06/08/2019   Migraines    MS (multiple sclerosis) ([place])     PAST SURGICAL HISTORY: Past Surgical History:  Procedure Laterality Date   CHOLECYSTECTOMY N/A 10/19/2019   Procedure: LAPAROSCOPIC CHOLECYSTECTOMY;  Surgeon: Clovis Riley, MD;  Location: [facility];  Service: General;  Laterality: N/A;   LAPAROSCOPIC APPENDECTOMY N/A 01/17/2020   Procedure: APPENDECTOMY LAPAROSCOPIC;  Surgeon: Erroll Luna, MD;  Location: [facility];  Service: General;  Laterality: N/A;    FAMILY HISTORY: Family History  Problem Relation Age of Onset   Migraines Mother    Thyroid disease Mother    Cancer Maternal Grandmother    Hypertension Maternal Grandfather    Rheum arthritis Paternal Grandmother    Diabetes Paternal Grandmother    Stroke Paternal Grandmother    Diabetes Paternal Grandfather     SOCIAL HISTORY: Social History   Socioeconomic History   Marital status: Single     Spouse name: Not on file   Number of children: 0   Years of education: college student   Highest education level: Not on file  Occupational History   Not on file  Tobacco Use   Smoking status: Never   Smokeless tobacco: Never  Vaping Use   Vaping Use: Never used  Substance and Sexual Activity   Alcohol use: Not Currently   Drug use: No   Sexual activity: Not Currently  Other Topics Concern   Not on file  Social History Narrative   Lives at home alone   Right handed   Does not drink caffeine, makes her nauseated   Social Determinants of Health   Financial Resource Strain: Not on file  Food Insecurity: Not on file  Transportation Needs: No Transportation Needs (02/04/2017)   PRAPARE - Hydrologist (Medical): No    Lack of Transportation (Non-Medical): No  Physical Activity: Not on file  Stress: Not on file  Social Connections: Not on file  Intimate Partner Violence: Not on file      PHYSICAL EXAM  There were no vitals filed for this visit.   There is no height or weight on file to calculate BMI.  Generalized: Well developed, in no acute distress  Cardiology: normal rate and rhythm, no murmur noted Neurological examination  Mentation: Alert oriented to time, place, history taking. Follows all commands speech and language fluent Cranial nerve II-XII: Pupils were equal round reactive to light. Extraocular movements were full, visual field were full on confrontational test. Facial sensation and strength were normal. Uvula tongue midline. Head turning and shoulder shrug  were normal and symmetric. Motor: The motor testing reveals 5 over 5 strength of all 4 extremities. Good symmetric motor tone is noted throughout.  Gait and station: Gait is normal.   DIAGNOSTIC DATA (LABS, IMAGING, TESTING) - I reviewed patient records, labs, notes,  testing and imaging myself where available.      No data to display           Lab Results   Component Value Date   WBC 8.3 01/01/2022   HGB 12.4 01/01/2022   HCT 36.6 01/01/2022   MCV 92 01/01/2022   PLT 317 01/01/2022      Component Value Date/Time   NA 139 08/23/2021 1059   NA 140 12/28/2019 1655   K 3.5 08/23/2021 1059   CL 101 08/23/2021 1059   CO2 29 08/23/2021 1059   GLUCOSE 86 08/23/2021 1059   BUN 8 08/23/2021 1059   BUN 9 12/28/2019 1655   CREATININE 0.62 08/23/2021 1059   CALCIUM 9.9 08/23/2021 1059   PROT 7.2 01/01/2022 1514   ALBUMIN 4.6 01/01/2022 1514   ALBUMIN 4.2 07/01/2019 0919   AST 17 01/01/2022 1514   ALT 7 01/01/2022 1514   ALKPHOS 67 01/01/2022 1514   BILITOT <0.2 01/01/2022 1514   GFRNONAA >60 02/24/2021 2319   GFRAA 138 12/28/2019 1655   Lab Results  Component Value Date   CHOL 192 09/09/2020   HDL 50 09/09/2020   LDLCALC 128 (H) 09/09/2020   TRIG 68 09/09/2020   CHOLHDL 3.8 09/09/2020   Lab Results  Component Value Date   HGBA1C 5.2 09/11/2020   Lab Results  Component Value Date   VITAMINB12 553 06/17/2019   Lab Results  Component Value Date   TSH 0.88 08/23/2021    ASSESSMENT AND PLAN 26 y.o. year old female  has a past medical history of Anemia, Anxiety, Depression, GERD (gastroesophageal reflux disease), Hypovitaminosis D (04/07/2019), Left eye complaint (06/08/2019), Migraines, and MS (multiple sclerosis) ([place]). here with     ICD-10-CM   1. Multiple sclerosis ([place])  G35     2. High risk medication use  Z79.899     3. Acute pain of right shoulder  M25.511     4. Numbness and tingling of right arm  R20.0    R20.2         Gae is doing well on dimethyl fumarate. We will continue current therapy.  She will continue Botox for migraine prevention and Ubrelvy for abortive therapy. She will continue close follow up with psychiatry, ophthalmology and PCP. Adequate hydration and regular exercise advised. She will return to see me in 6 months. She verbalizes understanding and agreement with this plan.   No orders of  the defined types were placed in this encounter.    No orders of the defined types were placed in this encounter.     Debbora Presto, FNP-C 01/08/2022, 9:09 AM [facility] [address], [place], [place] [address] [phone]

## 2022-01-09 ENCOUNTER — Telehealth: Payer: Self-pay | Admitting: Family Medicine

## 2022-01-09 ENCOUNTER — Encounter: Payer: Self-pay | Admitting: Family Medicine

## 2022-01-09 ENCOUNTER — Ambulatory Visit (INDEPENDENT_AMBULATORY_CARE_PROVIDER_SITE_OTHER): Payer: Medicare Other | Admitting: Family Medicine

## 2022-01-09 VITALS — BP 130/76 | HR 106 | Ht 60.0 in | Wt 94.5 lb

## 2022-01-09 DIAGNOSIS — R200 Anesthesia of skin: Secondary | ICD-10-CM | POA: Diagnosis not present

## 2022-01-09 DIAGNOSIS — R29898 Other symptoms and signs involving the musculoskeletal system: Secondary | ICD-10-CM

## 2022-01-09 DIAGNOSIS — Z79899 Other long term (current) drug therapy: Secondary | ICD-10-CM

## 2022-01-09 DIAGNOSIS — M25511 Pain in right shoulder: Secondary | ICD-10-CM | POA: Diagnosis not present

## 2022-01-09 DIAGNOSIS — R202 Paresthesia of skin: Secondary | ICD-10-CM

## 2022-01-09 DIAGNOSIS — G35 Multiple sclerosis: Secondary | ICD-10-CM

## 2022-01-09 NOTE — Telephone Encounter (Signed)
medicare/medicaid NPR sent to [facility] [phone] 

## 2022-01-15 ENCOUNTER — Other Ambulatory Visit: Payer: Self-pay | Admitting: *Deleted

## 2022-01-15 DIAGNOSIS — G35 Multiple sclerosis: Secondary | ICD-10-CM

## 2022-01-15 MED ORDER — DIMETHYL FUMARATE 240 MG PO CPDR: 240.0000 mg | DELAYED_RELEASE_CAPSULE | Freq: Two times a day (BID) | ORAL | 3 refills | Status: DC

## 2022-01-23 ENCOUNTER — Other Ambulatory Visit: Payer: Medicare Other

## 2022-01-23 NOTE — Progress Notes (Unsigned)
01/23/2022 ALL: Tracy Mcconnell returns for Botox. She is doing well. Neck pain has improved. She is scheduled for MRI 02/09/2022. Migraines unchanged.   10/25/2021 ALL: Tracy Mcconnell returns for Botox. She is doing well. She has about 10 headache days a month with 2-3 migraines. Roselyn Meier works well.   08/02/2021 ALL: Tracy Mcconnell returns for Botox. She continues to do well. Last seen by Dr [name] 05/2021. MS stable on Tecfidera.   05/09/2021 ALL: She returns for Botox. She feels migraines are well managed. She may have 7-8 milder headaches per month but onlu 1-2 migraines. Roselyn Meier helps with abortive therapy. No new MS symptoms. She continues Tecfidera. She missed her follow up with Dr [name] 04/19/21.   02/14/2021 ALL: She continues to do well on Botox. Roselyn Meier helps with migraine abortion. She feels migraines are better She has about 10 headache days with 3 severe headaches monthly. She continues tecfidera. She has follow up with [facility] in January. She denies new or worsening symptoms.   11/03/2020: She continues to do well with Botox therapy. Migraines are well managed. Nurtec seems to work best for abortive therapy but not covered. She is using Ubrelvy that helps some. She was started on Tecfidera 09/2020 following concerns of 1 new focus on brain MRI. She is tolerating well and has follow up with Dr [name] 04/2021.   08/02/2020 ALL: She continues to note benefit of Botox in migraine management. She continues Ubrelvy for abortive therapy. She continues follow up with Dr [name]. Reports bilateral face, upper and lower extremity numbness on two separate occasions. No changes in MRI last performed 05/2020. She has follow up with Dr [name] in 12/2020.   04/21/2020 ALL: She returns today for fourth Botox procedure. She tolerated last procedure well Ward [name]). She does feel Botox is helping migraines. She does continue to have about 10 migraine days per month. She has failed sumatriptan and rizatriptan. She is taking gabapentin and  indomethacin for back pain. Nurtec works well but not covered by [employer]. She has not tried [place]. 1 Nurtec sample and 2 Ubrelvy 50mg  samples given in office today.   She did have optic neuritis in March. MRI concerning for foci. LP performed and CSF normal. She is being followed by Dr [name] every 6 months with repeat imaging planned. She reports vision loss of left eye resolved. She has had some blurred/double vision bilaterally and is followed by Dr [name].    Consent Form Botulism Toxin Injection For Chronic Migraine    Reviewed orally with patient, additionally signature is on file:  Botulism toxin has been approved by the Federal drug administration for treatment of chronic migraine. Botulism toxin does not cure chronic migraine and it may not be effective in some patients.  The administration of botulism toxin is accomplished by injecting a small amount of toxin into the muscles of the neck and head. Dosage must be titrated for each individual. Any benefits resulting from botulism toxin tend to wear off after 3 months with a repeat injection required if benefit is to be maintained. Injections are usually done every 3-4 months with maximum effect peak achieved by about 2 or 3 weeks. Botulism toxin is expensive and you should be sure of what costs you will incur resulting from the injection.  The side effects of botulism toxin use for chronic migraine may include:   -Transient, and usually mild, facial weakness with facial injections  -Transient, and usually mild, head or neck weakness with head/neck injections  -Reduction or loss of  forehead facial animation due to forehead muscle weakness  -Eyelid drooping  -Dry eye  -Pain at the site of injection or bruising at the site of injection  -Double vision  -Potential unknown long term risks   Contraindications: You should not have Botox if you are pregnant, nursing, allergic to albumin, have an infection, skin condition, or muscle  weakness at the site of the injection, or have myasthenia gravis, Lambert-Eaton syndrome, or ALS.  It is also possible that as with any injection, there may be an allergic reaction or no effect from the medication. Reduced effectiveness after repeated injections is sometimes seen and rarely infection at the injection site may occur. All care will be taken to prevent these side effects. If therapy is given over a long time, atrophy and wasting in the muscle injected may occur. Occasionally the patient's become refractory to treatment because they develop antibodies to the toxin. In this event, therapy needs to be modified.  I have read the above information and consent to the administration of botulism toxin.    BOTOX PROCEDURE NOTE FOR MIGRAINE HEADACHE  Contraindications and precautions discussed with patient(above). Aseptic procedure was observed and patient tolerated procedure. Procedure performed by Shawnie Dapper, FNP-C.   The condition has existed for more than 6 months, and pt does not have a diagnosis of ALS, Myasthenia Gravis or Lambert-Eaton Syndrome.  Risks and benefits of injections discussed and pt agrees to proceed with the procedure.  Written consent obtained  These injections are medically necessary. Pt  receives good benefits from these injections. These injections do not cause sedations or hallucinations which the oral therapies may cause.   Description of procedure:  The patient was placed in a sitting position. The standard protocol was used for Botox as follows, with 5 units of Botox injected at each site:  -Procerus muscle, midline injection  -Corrugator muscle, bilateral injection  -Frontalis muscle, bilateral injection, with 2 sites each side, medial injection was performed in the upper one third of the frontalis muscle, in the region vertical from the medial inferior edge of the superior orbital rim. The lateral injection was again in the upper one third of the forehead  vertically above the lateral limbus of the cornea, 1.5 cm lateral to the medial injection site.  -Temporalis muscle injection, 4 sites, bilaterally. The first injection was 3 cm above the tragus of the ear, second injection site was 1.5 cm to 3 cm up from the first injection site in line with the tragus of the ear. The third injection site was 1.5-3 cm forward between the first 2 injection sites. The fourth injection site was 1.5 cm posterior to the second injection site. 5th site laterally in the temporalis  muscleat the level of the outer canthus.  -Occipitalis muscle injection, 3 sites, bilaterally. The first injection was done one half way between the occipital protuberance and the tip of the mastoid process behind the ear. The second injection site was done lateral and superior to the first, 1 fingerbreadth from the first injection. The third injection site was 1 fingerbreadth superiorly and medially from the first injection site.  -Cervical paraspinal muscle injection, 2 sites, bilaterally. The first injection site was 1 cm from the midline of the cervical spine, 3 cm inferior to the lower border of the occipital protuberance. The second injection site was 1.5 cm superiorly and laterally to the first injection site.  -Trapezius muscle injection was performed at 3 sites, bilaterally. The first injection site was in the  upper trapezius muscle halfway between the inflection point of the neck, and the acromion. The second injection site was one half way between the acromion and the first injection site. The third injection was done between the first injection site and the inflection point of the neck.   Will return for repeat injection in 3 months.   A total of 200 units of Botox was prepared, 155 units of Botox was injected as documented above, any Botox not injected was wasted. The patient tolerated the procedure well, there were no complications of the above procedure.

## 2022-01-24 ENCOUNTER — Ambulatory Visit (INDEPENDENT_AMBULATORY_CARE_PROVIDER_SITE_OTHER): Payer: Medicare Other | Admitting: Family Medicine

## 2022-01-24 VITALS — Ht 60.0 in | Wt 88.8 lb

## 2022-01-24 DIAGNOSIS — G43709 Chronic migraine without aura, not intractable, without status migrainosus: Secondary | ICD-10-CM

## 2022-01-24 MED ORDER — ONABOTULINUMTOXINA 200 UNITS IJ SOLR
155.0000 [IU] | Freq: Once | INTRAMUSCULAR | Status: AC
  Administered 2022-01-24: 155 [IU] via INTRAMUSCULAR

## 2022-01-24 NOTE — Progress Notes (Signed)
Botox- 200 units x 1 vial Lot: [id number] Expiration: 05/2024 NDC: 0023-3921-02  Bacteriostatic 0.9% Sodium Chloride- 4mL total Lot: [id number] Expiration: 12/02/2022 NDC: 0409-1966-02  Dx: G43.709 B/B  

## 2022-02-06 ENCOUNTER — Ambulatory Visit
Admission: EM | Admit: 2022-02-06 | Discharge: 2022-02-06 | Disposition: A | Discharge status: Home / Self Care | Payer: Medicare Other | Attending: Urgent Care | Admitting: Urgent Care

## 2022-02-06 ENCOUNTER — Ambulatory Visit (INDEPENDENT_AMBULATORY_CARE_PROVIDER_SITE_OTHER): Payer: Medicare Other

## 2022-02-06 ENCOUNTER — Other Ambulatory Visit: Payer: Self-pay

## 2022-02-06 DIAGNOSIS — Z8261 Family history of arthritis: Secondary | ICD-10-CM

## 2022-02-06 DIAGNOSIS — G35 Multiple sclerosis: Secondary | ICD-10-CM | POA: Diagnosis not present

## 2022-02-06 DIAGNOSIS — M79642 Pain in left hand: Secondary | ICD-10-CM | POA: Diagnosis not present

## 2022-02-06 DIAGNOSIS — M25542 Pain in joints of left hand: Secondary | ICD-10-CM

## 2022-02-06 MED ORDER — PREDNISONE 10 MG PO TABS: 30.0000 mg | ORAL_TABLET | Freq: Every day | ORAL | 0 refills | Status: DC

## 2022-02-06 NOTE — ED Provider Notes (Signed)
[name]  Note:  This document was prepared using Systems analyst and may include unintentional dictation errors.  MRN: [id number] DOB: Feb 17, 1996  Subjective:   Tracy Mcconnell is a 26 y.o. female presenting for 3-day history of acute onset persistent left hand joint pain worse over the knuckles, left index finger.  No fall, trauma, bruising, redness, swelling, bony deformity.  She has concerns about rheumatoid arthritis as this runs in her family and would like to be tested for this.  She also has a history of multiple sclerosis.  Patient reports that she does not have a job and generally is not very physically active.  She just uses her hands for activities of daily living.  Has been using Motrin for relief but is not working.  No current facility-administered medications for this encounter.  Current Outpatient Medications:    buPROPion (WELLBUTRIN XL) 300 MG 24 hr tablet, Take 1 tablet (300 mg total) by mouth daily., Disp: 30 tablet, Rfl: 2   busPIRone (BUSPAR) 30 MG tablet, Take 1 tablet (30 mg total) by mouth in the morning and at bedtime., Disp: 60 tablet, Rfl: 2   Dimethyl Fumarate (TECFIDERA) 240 MG CPDR, Take 1 capsule (240 mg total) by mouth in the morning and at bedtime., Disp: 180 capsule, Rfl: 3   FLUoxetine (PROZAC) 40 MG capsule, Take 2 capsules (80 mg total) by mouth in the morning., Disp: 60 capsule, Rfl: 2   gabapentin (NEURONTIN) 600 MG tablet, Take 1 tablet (600 mg total) by mouth 4 (four) times daily., Disp: 120 tablet, Rfl: 0   ondansetron (ZOFRAN ODT) 4 MG disintegrating tablet, Take 1 tablet (4 mg total) by mouth every 8 (eight) hours as needed for nausea or vomiting., Disp: 20 tablet, Rfl: 0   ondansetron (ZOFRAN) 4 MG tablet, Take 1 tablet (4 mg total) by mouth every 8 (eight) hours as needed for nausea or vomiting., Disp: 20 tablet, Rfl: 0   pantoprazole (PROTONIX) 40 MG tablet, Take 1 tablet (40 mg total) by  mouth daily., Disp: 30 tablet, Rfl: 3   QUEtiapine (SEROQUEL) 100 MG tablet, Take 1 tablet (100 mg total) by mouth at bedtime., Disp: 30 tablet, Rfl: 2   sucralfate (CARAFATE) 1 GM/10ML suspension, Take 10 mLs (1 g total) by mouth 4 (four) times daily -  with meals and at bedtime., Disp: 420 mL, Rfl: 0   Ubrogepant (UBRELVY) 100 MG TABS, Take 100 mg by mouth daily as needed. Take one tablet at onset of headache, may repeat 1 tablet in 2 hours, no more than 2 tablets in 24 hours, Disp: 24 tablet, Rfl: 3   Allergies  Allergen Reactions   Sumatriptan Shortness Of Breath and Other (See Comments)    Entire body "felt heavy"   Other Itching, Swelling and Other (See Comments)    Pesticides (family history)- Exposure face and mouth itch and the tongue/lips swell    Past Medical History:  Diagnosis Date   Anemia    Anxiety    Depression    GERD (gastroesophageal reflux disease)    Hypovitaminosis D 04/07/2019   Left eye complaint 06/08/2019   Migraines    MS (multiple sclerosis) ([place])      Past Surgical History:  Procedure Laterality Date   CHOLECYSTECTOMY N/A 10/19/2019   Procedure: LAPAROSCOPIC CHOLECYSTECTOMY;  Surgeon: Clovis Riley, MD;  Location: [facility];  Service: General;  Laterality: N/A;   LAPAROSCOPIC APPENDECTOMY N/A 01/17/2020   Procedure: APPENDECTOMY LAPAROSCOPIC;  Surgeon: Harriette Bouillon, MD;  Location: [facility];  Service: General;  Laterality: N/A;    Family History  Problem Relation Age of Onset   Migraines Mother    Thyroid disease Mother    Cancer Maternal Grandmother    Hypertension Maternal Grandfather    Rheum arthritis Paternal Grandmother    Diabetes Paternal Grandmother    Stroke Paternal Grandmother    Diabetes Paternal Grandfather     Social History   Tobacco Use   Smoking status: Never   Smokeless tobacco: Never  Vaping Use   Vaping Use: Never used  Substance Use Topics   Alcohol use: Not Currently   Drug use: No    ROS   Objective:    Vitals: BP 115/76 (BP Location: Left Arm)   Pulse 89   Temp 98.2 F (36.8 C) (Oral)   Resp 16   LMP 01/24/2022   SpO2 98%   Physical Exam Constitutional:      General: She is not in acute distress.    Appearance: Normal appearance. She is well-developed. She is not ill-appearing, toxic-appearing or diaphoretic.  HENT:     Head: Normocephalic and atraumatic.     Nose: Nose normal.     Mouth/Throat:     Mouth: Mucous membranes are moist.  Eyes:     General: No scleral icterus.       Right eye: No discharge.        Left eye: No discharge.     Extraocular Movements: Extraocular movements intact.  Cardiovascular:     Rate and Rhythm: Normal rate.  Pulmonary:     Effort: Pulmonary effort is normal.  Musculoskeletal:       Hands:  Skin:    General: Skin is warm and dry.  Neurological:     General: No focal deficit present.     Mental Status: She is alert and oriented to person, place, and time.  Psychiatric:        Mood and Affect: Mood normal.        Behavior: Behavior normal.     DG Finger Index Left  Result Date: 02/06/2022 CLINICAL DATA:  Left hand and joint pain. EXAM: LEFT INDEX FINGER 2+V COMPARISON:  None Available. FINDINGS: There is no evidence of fracture or dislocation. There is no evidence of arthropathy or other focal bone abnormality. Soft tissues are unremarkable. IMPRESSION: Negative. Electronically Signed   By: Kennith Center M.D.   On: 02/06/2022 09:18     Assessment and Plan :   PDMP not reviewed this encounter.  1. Arthralgia of left hand   2. Family history of rheumatoid arthritis   3. Multiple sclerosis (HCC)     Patient reports that usually she responds better with steroids and therefore offered her 30 mg for the next 5 days.  Labs pending.  X-ray negative. Counseled patient on potential for adverse effects with medications prescribed/recommended today, ER and return-to-clinic precautions discussed, patient verbalized understanding.     Wallis Bamberg, PA-C 02/06/22 0930

## 2022-02-06 NOTE — ED Triage Notes (Signed)
Pt c/o pain to left hand/"joints" x 3 days-denies injury-no relief with motrin-NAD-steady gait

## 2022-02-08 LAB — RHEUMATOID FACTOR: Rheumatoid fact SerPl-aCnc: <10 IU/mL (ref <14.0)

## 2022-02-08 LAB — ANTINUCLEAR ANTIBODIES, IFA: ANA Titer 1: NEGATIVE

## 2022-02-08 LAB — SEDIMENTATION RATE: Sed Rate: 2 mm/hr (ref 0–32)

## 2022-02-08 LAB — CYCLIC CITRUL PEPTIDE ANTIBODY, IGG/IGA: Cyclic Citrullin Peptide Ab: <1 units (ref 0–19)

## 2022-02-09 ENCOUNTER — Ambulatory Visit
Admission: RE | Admit: 2022-02-09 | Discharge: 2022-02-09 | Disposition: A | Discharge status: Home / Self Care | Payer: Medicare Other | Source: Ambulatory Visit | Attending: Family Medicine | Admitting: Family Medicine

## 2022-02-09 DIAGNOSIS — M25511 Pain in right shoulder: Secondary | ICD-10-CM

## 2022-02-09 DIAGNOSIS — G35 Multiple sclerosis: Secondary | ICD-10-CM | POA: Diagnosis not present

## 2022-02-09 DIAGNOSIS — R202 Paresthesia of skin: Secondary | ICD-10-CM

## 2022-02-09 DIAGNOSIS — R29898 Other symptoms and signs involving the musculoskeletal system: Secondary | ICD-10-CM

## 2022-02-09 MED ORDER — GADOPICLENOL 0.5 MMOL/ML IV SOLN
5.0000 mL | Freq: Once | INTRAVENOUS | Status: AC | PRN
  Administered 2022-02-09: 5 mL via INTRAVENOUS

## 2022-03-05 ENCOUNTER — Telehealth: Payer: Medicare Other | Admitting: Physician Assistant

## 2022-03-05 DIAGNOSIS — Z202 Contact with and (suspected) exposure to infections with a predominantly sexual mode of transmission: Secondary | ICD-10-CM

## 2022-03-05 NOTE — Progress Notes (Signed)
Because of having a positive STD exposure, I feel your condition warrants further evaluation and I recommend that you be seen in a face to face visit. It is a department policy, unfortunately, that testing has to be collected for treatment. You could call the local county health department where they were tested to see if they would be able to treat you based off your partners results.    NOTE: There will be NO CHARGE for this eVisit   If you are having a true medical emergency please call 911.      For an urgent face to face visit, [place] has seven urgent care centers for your convenience:     [facility] Health Ur[facility] Center at [facility] Directions [phone] [address] [place], [place] [address]    [facility] Health Urgent Care Center [facility]) Get Driving Directions [phone] [address] [place], [place] [address]  [facility] Health Urgent Care Center [facility]) Get Driving Directions [phone] [address] [place],  [place]  [address]  [facility] Health Urgent Care Center [facility] at [employer] Directions  [phone] [address] [place],  [place] [address]   [facility] Health Urgent Care at [facility] Get Driving Directions [phone] [address], [address] [place], [place] [address]   [facility] Health Urgent Care at [facility] Get Driving Directions  [phone] [address] [place], [place] [address]   [facility] Health Urgent Care at [facility] Directions [phone] [address] [place], [place] [address]  Your MyChart E-visit questionnaire answers were reviewed by a board certified advanced clinical practitioner to complete your personal care plan based on your specific symptoms.  Thank you for using e-Visits.   I have spent 5 minutes in review of e-visit questionnaire, review and updating patient chart, medical decision  making and response to patient.   Margaretann Loveless, PA-C

## 2022-03-06 ENCOUNTER — Ambulatory Visit
Admission: RE | Admit: 2022-03-06 | Discharge: 2022-03-06 | Disposition: A | Discharge status: Home / Self Care | Payer: Medicare Other | Source: Ambulatory Visit | Attending: Urgent Care | Admitting: Urgent Care

## 2022-03-06 VITALS — BP 107/74 | HR 74 | Temp 98.0°F | Resp 16

## 2022-03-06 DIAGNOSIS — Z202 Contact with and (suspected) exposure to infections with a predominantly sexual mode of transmission: Secondary | ICD-10-CM | POA: Diagnosis present

## 2022-03-06 DIAGNOSIS — Z3202 Encounter for pregnancy test, result negative: Secondary | ICD-10-CM | POA: Diagnosis not present

## 2022-03-06 LAB — POCT URINE PREGNANCY: Preg Test, Ur: NEGATIVE

## 2022-03-06 MED ORDER — DOXYCYCLINE HYCLATE 100 MG PO CAPS: 100.0000 mg | ORAL_CAPSULE | Freq: Two times a day (BID) | ORAL | 0 refills | Status: DC

## 2022-03-06 NOTE — Discharge Instructions (Signed)
Avoid all forms of sexual intercourse (oral, vaginal, anal) for the next 7 days to avoid spreading/reinfecting or at least until we can see what kinds of infection results are positive.  Abstaining for 2 weeks would be better but at least 1 week is required.  We will let you know about your test results from the swab we did today and if you need any prescriptions for antibiotics or changes to your treatment from today.  

## 2022-03-06 NOTE — ED Triage Notes (Signed)
Pt states she was notified of +STD exposure-denies vaginal and dysuria-NAD-steady gait

## 2022-03-06 NOTE — ED Provider Notes (Signed)
[name]  Note:  This document was prepared using Conservation officer, historic buildings and may include unintentional dictation errors.  MRN: [id number] DOB: 1996-01-28  Subjective:   Tracy Mcconnell is a 26 y.o. female presenting for STI treatment. Patient had unprotected sex with a partner that tested positive for chlamydia. Denies fever, n/v, abdominal pain, pelvic pain, rashes, dysuria, urinary frequency, hematuria, vaginal discharge.  LMP was 02/24/2022, uses condoms during sex.   No current facility-administered medications for this encounter.  Current Outpatient Medications:    buPROPion (WELLBUTRIN XL) 300 MG 24 hr tablet, Take 1 tablet (300 mg total) by mouth daily., Disp: 30 tablet, Rfl: 2   busPIRone (BUSPAR) 30 MG tablet, Take 1 tablet (30 mg total) by mouth in the morning and at bedtime., Disp: 60 tablet, Rfl: 2   Dimethyl Fumarate (TECFIDERA) 240 MG CPDR, Take 1 capsule (240 mg total) by mouth in the morning and at bedtime., Disp: 180 capsule, Rfl: 3   FLUoxetine (PROZAC) 40 MG capsule, Take 2 capsules (80 mg total) by mouth in the morning., Disp: 60 capsule, Rfl: 2   gabapentin (NEURONTIN) 600 MG tablet, Take 1 tablet (600 mg total) by mouth 4 (four) times daily., Disp: 120 tablet, Rfl: 0   ondansetron (ZOFRAN ODT) 4 MG disintegrating tablet, Take 1 tablet (4 mg total) by mouth every 8 (eight) hours as needed for nausea or vomiting., Disp: 20 tablet, Rfl: 0   ondansetron (ZOFRAN) 4 MG tablet, Take 1 tablet (4 mg total) by mouth every 8 (eight) hours as needed for nausea or vomiting., Disp: 20 tablet, Rfl: 0   pantoprazole (PROTONIX) 40 MG tablet, Take 1 tablet (40 mg total) by mouth daily., Disp: 30 tablet, Rfl: 3   predniSONE (DELTASONE) 10 MG tablet, Take 3 tablets (30 mg total) by mouth daily with breakfast., Disp: 15 tablet, Rfl: 0   QUEtiapine (SEROQUEL) 100 MG tablet, Take 1 tablet (100 mg total) by mouth at bedtime., Disp: 30 tablet, Rfl:  2   sucralfate (CARAFATE) 1 GM/10ML suspension, Take 10 mLs (1 g total) by mouth 4 (four) times daily -  with meals and at bedtime., Disp: 420 mL, Rfl: 0   Ubrogepant (UBRELVY) 100 MG TABS, Take 100 mg by mouth daily as needed. Take one tablet at onset of headache, may repeat 1 tablet in 2 hours, no more than 2 tablets in 24 hours, Disp: 24 tablet, Rfl: 3   Allergies  Allergen Reactions   Sumatriptan Shortness Of Breath and Other (See Comments)    Entire body "felt heavy"   Other Itching, Swelling and Other (See Comments)    Pesticides (family history)- Exposure face and mouth itch and the tongue/lips swell    Past Medical History:  Diagnosis Date   Anemia    Anxiety    Depression    GERD (gastroesophageal reflux disease)    Hypovitaminosis D 04/07/2019   Left eye complaint 06/08/2019   Migraines    MS (multiple sclerosis) (HCC)      Past Surgical History:  Procedure Laterality Date   CHOLECYSTECTOMY N/A 10/19/2019   Procedure: LAPAROSCOPIC CHOLECYSTECTOMY;  Surgeon: Berna Bue, MD;  Location: [facility];  Service: General;  Laterality: N/A;   LAPAROSCOPIC APPENDECTOMY N/A 01/17/2020   Procedure: APPENDECTOMY LAPAROSCOPIC;  Surgeon: Harriette Bouillon, MD;  Location: [facility];  Service: General;  Laterality: N/A;    Family History  Problem Relation Age of Onset   Migraines Mother    Thyroid disease Mother  Cancer Maternal Grandmother    Hypertension Maternal Grandfather    Rheum arthritis Paternal Grandmother    Diabetes Paternal Grandmother    Stroke Paternal Grandmother    Diabetes Paternal Grandfather     Social History   Tobacco Use   Smoking status: Never   Smokeless tobacco: Never  Vaping Use   Vaping Use: Never used  Substance Use Topics   Alcohol use: Not Currently   Drug use: No    ROS   Objective:   Vitals: BP 107/74 (BP Location: Left Arm)   Pulse 74   Temp 98 F (36.7 C) (Oral)   Resp 16   LMP 02/24/2022   SpO2 97%   Physical  Exam Constitutional:      General: She is not in acute distress.    Appearance: Normal appearance. She is well-developed. She is not ill-appearing, toxic-appearing or diaphoretic.  HENT:     Head: Normocephalic and atraumatic.     Nose: Nose normal.     Mouth/Throat:     Mouth: Mucous membranes are moist.  Eyes:     General: No scleral icterus.       Right eye: No discharge.        Left eye: No discharge.     Extraocular Movements: Extraocular movements intact.  Cardiovascular:     Rate and Rhythm: Normal rate.  Pulmonary:     Effort: Pulmonary effort is normal.  Skin:    General: Skin is warm and dry.  Neurological:     General: No focal deficit present.     Mental Status: She is alert and oriented to person, place, and time.  Psychiatric:        Mood and Affect: Mood normal.        Behavior: Behavior normal.     Results for orders placed or performed during the hospital encounter of 03/06/22 (from the past 24 hour(s))  POCT urine pregnancy     Status: None   Collection Time: 03/06/22  9:39 AM  Result Value Ref Range   Preg Test, Ur Negative Negative    Assessment and Plan :   PDMP not reviewed this encounter.  1. Exposure to chlamydia     Recommended empiric treatment with doxycycline for her exposure to chlamydia.  Labs pending, will treat as appropriate otherwise.  Recommended abstaining for 1 to 2 weeks. Counseled patient on potential for adverse effects with medications prescribed/recommended today, ER and return-to-clinic precautions discussed, patient verbalized understanding.    Wallis Bamberg, [place] 03/06/22 [id number]

## 2022-03-07 LAB — CERVICOVAGINAL ANCILLARY ONLY
Chlamydia: NEGATIVE
Comment: NEGATIVE
Comment: NEGATIVE
Comment: NORMAL
Neisseria Gonorrhea: NEGATIVE
Trichomonas: NEGATIVE

## 2022-03-21 ENCOUNTER — Other Ambulatory Visit: Payer: Self-pay

## 2022-03-21 MED ORDER — GABAPENTIN 600 MG PO TABS: 600.0000 mg | ORAL_TABLET | Freq: Four times a day (QID) | ORAL | 1 refills | Status: DC

## 2022-03-22 ENCOUNTER — Telehealth (HOSPITAL_COMMUNITY): Payer: Self-pay | Admitting: *Deleted

## 2022-03-22 NOTE — Telephone Encounter (Signed)
Patient not seen since 9/23 per Eddie PA who is no longer working at this clinic. She does not have a future appt scheduled so these meds will not be refilled at this time. I will ask the front desk staff to reach out to her to schedule a new appt with a different provider.

## 2022-03-23 ENCOUNTER — Telehealth (HOSPITAL_COMMUNITY): Payer: Self-pay | Admitting: *Deleted

## 2022-03-23 NOTE — Telephone Encounter (Signed)
REFILL REQUEST CAME   FROM: [facility] - [place], [place] - [address]                 &&&& PATIENT USES :  [id number] - [place], [place] - [address]

## 2022-03-27 ENCOUNTER — Telehealth (INDEPENDENT_AMBULATORY_CARE_PROVIDER_SITE_OTHER): Payer: Medicare Other | Admitting: Psychiatry

## 2022-03-27 DIAGNOSIS — F332 Major depressive disorder, recurrent severe without psychotic features: Secondary | ICD-10-CM

## 2022-03-27 DIAGNOSIS — F411 Generalized anxiety disorder: Secondary | ICD-10-CM | POA: Diagnosis not present

## 2022-03-27 MED ORDER — BUPROPION HCL ER (XL) 300 MG PO TB24: 300.0000 mg | ORAL_TABLET | Freq: Every day | ORAL | 2 refills | Status: AC

## 2022-03-27 MED ORDER — TRAZODONE HCL 50 MG PO TABS: 50.0000 mg | ORAL_TABLET | Freq: Every evening | ORAL | 2 refills | Status: AC | PRN

## 2022-03-27 MED ORDER — FLUOXETINE HCL 40 MG PO CAPS: 80.0000 mg | ORAL_CAPSULE | Freq: Every morning | ORAL | 2 refills | Status: AC

## 2022-03-27 MED ORDER — BUSPIRONE HCL 30 MG PO TABS: 30.0000 mg | ORAL_TABLET | Freq: Two times a day (BID) | ORAL | 2 refills | Status: AC

## 2022-03-27 NOTE — Progress Notes (Signed)
BH MD/PA/NP OP Progress Note  Virtual Visit via Video Note  I connected with Tracy Mcconnell on 03/27/22 at  3:00 PM EST by a video enabled telemedicine application and verified that I am speaking with the correct person using two identifiers.  Location: Patient: Home Provider: Clinic   I discussed the limitations of evaluation and management by telemedicine and the availability of in person appointments. The patient expressed understanding and agreed to proceed.  Follow Up Instructions:   I discussed the assessment and treatment plan with the patient. The patient was provided an opportunity to ask questions and all were answered. The patient agreed with the plan and demonstrated an understanding of the instructions.   The patient was advised to call back or seek an in-person evaluation if the symptoms worsen or if the condition fails to improve as anticipated.  I provided 25 minutes of non-face-to-face time during this encounter.  Armando Reichert, MD   03/27/2022 3:36 PM Tracy GIAMMONA  MRN:  [id number]  Chief Complaint:    HPI:   Tracy Mcconnell is a 26 year old female with a past psychiatric history significant for generalized anxiety disorder, major depressive disorder, and insomnia who presents to [facility] for follow-up and medication management.  Patient was last seen by Arlina Robes on 12/12/2020.  During her last encounter, patient was being managed on the following medications:  Bupropion (Wellbutrin XL) 300 mg 24-hour tablet daily Buspirone 30 mg 2 times daily Fluoxetine 80 mg daily Seroquel 100 mg at bedtime  Pt reports that her mood is "stable". She reports that her depression is stable but she still has high anxiety.  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  She is currently unemployed and lives with her  ex-boyfriend. She reports that she wakes up at night multiple times.  She has tried trazodone (does not remember dose), hydroxyzine and melatonin in the past which did not help with her insomnia.  She reports that she tried melatonin 3 mg which did not help and 10 mg made her feel groggy the next day.  She reports that Wellbutrin helps her with focus and she can complete daily activities, BuSpar helps with her anxiety and Prozac helps with her OCD and depression.  She reports OCD symptoms as nailbiting and skin picking.  She still has some symptoms but overall it is well-controlled. She reports stable appetite. She takes Seroquel for insomnia.  She denies any history of psychotic/manic/hypomanic symptoms or episodes. Discussed stopping Seroquel due to risk of side effects and starting trazodone to help with  insomnia.  Discussed that she can also add 3 to 6 mg of melatonin as needed with trazodone.  She agrees  with the plan.  She is also taking gabapentin 600 mg 4 times daily for neuropathic pain.  Currently, she denies any suicidal ideations, homicidal ideations, auditory and visual hallucinations.  She denies paranoia.  She denies any medication side effects and has been tolerating it well.  She denies drinking alcohol and denies using any illicit drugs.  She does not smoke cigarettes. She denies any other concerns. Patient is alert and oriented x 4,  calm, cooperative, and fully engaged in conversation during the encounter.  Her thought process is linear with coherent speech . She does not appear to be responding to internal/external stimuli .     Visit Diagnosis:  ICD-10-CM   1. Severe episode of recurrent major depressive disorder, without psychotic features (HCC)  F33.2 buPROPion (WELLBUTRIN XL) 300 MG 24 hr tablet    FLUoxetine (PROZAC) 40 MG capsule    traZODone (DESYREL) 50 MG tablet    2. Generalized anxiety disorder  F41.1 busPIRone (BUSPAR) 30 MG tablet    FLUoxetine (PROZAC) 40 MG capsule       Past Psychiatric History:  Generalized anxiety disorder OCD Depression Sleep disturbances  Past Medical History:  Past Medical History:  Diagnosis Date   Anemia    Anxiety    Depression    GERD (gastroesophageal reflux disease)    Hypovitaminosis D 04/07/2019   Left eye complaint 06/08/2019   Migraines    MS (multiple sclerosis) (HCC)     Past Surgical History:  Procedure Laterality Date   CHOLECYSTECTOMY N/A 10/19/2019   Procedure: LAPAROSCOPIC CHOLECYSTECTOMY;  Surgeon: Berna Bue, MD;  Location: [facility];  Service: General;  Laterality: N/A;   LAPAROSCOPIC APPENDECTOMY N/A 01/17/2020   Procedure: APPENDECTOMY LAPAROSCOPIC;  Surgeon: Harriette Bouillon, MD;  Location: [facility];  Service: General;  Laterality: N/A;    Family Psychiatric History:  Mother - bipolar depression, PTSD  Family History:  Family History  Problem Relation Age of Onset   Migraines Mother    Thyroid disease Mother    Cancer Maternal Grandmother    Hypertension Maternal Grandfather    Rheum arthritis Paternal Grandmother    Diabetes Paternal Grandmother    Stroke Paternal Grandmother    Diabetes Paternal Grandfather     Social History:  Social History   Socioeconomic History   Marital status: Single    Spouse name: Not on file   Number of children: 0   Years of education: college student   Highest education level: Not on file  Occupational History   Not on file  Tobacco Use   Smoking status: Never   Smokeless tobacco: Never  Vaping Use   Vaping Use: Never used  Substance and Sexual Activity   Alcohol use: Not Currently   Drug use: No   Sexual activity: Yes    Birth control/protection: None  Other Topics Concern   Not on file  Social History Narrative   Lives at home alone   Right handed   Does not drink caffeine, makes her nauseated   Social Determinants of Health   Financial Resource Strain: Not on file  Food Insecurity: Not on file  Transportation Needs: No  Transportation Needs (02/04/2017)   PRAPARE - Administrator, Civil Service (Medical): No    Lack of Transportation (Non-Medical): No  Physical Activity: Not on file  Stress: Not on file  Social Connections: Not on file    Allergies:  Allergies  Allergen Reactions   Sumatriptan Shortness Of Breath and Other (See Comments)    Entire body "felt heavy"   Other Itching, Swelling and Other (See Comments)    Pesticides (family history)- Exposure face and mouth itch and the tongue/lips swell    Metabolic Disorder Labs: Lab Results  Component Value Date   HGBA1C 5.2 09/11/2020   MPG 103 09/11/2020   MPG 100 09/09/2020   Lab Results  Component Value Date   PROLACTIN 36.5 (H) 09/09/2020   Lab Results  Component Value Date   CHOL 192 09/09/2020   TRIG 68 09/09/2020   HDL 50 09/09/2020   CHOLHDL 3.8 09/09/2020   VLDL 14 09/09/2020   LDLCALC 128 (H) 09/09/2020   Lab  Results  Component Value Date   TSH 0.88 08/23/2021   TSH 0.91 06/21/2021    Therapeutic Level Labs: No results found for: "LITHIUM" No results found for: "VALPROATE" No results found for: "CBMZ"  Current Medications: Current Outpatient Medications  Medication Sig Dispense Refill   traZODone (DESYREL) 50 MG tablet Take 1 tablet (50 mg total) by mouth at bedtime as needed for sleep (1-2 tabs as needed). 60 tablet 2   buPROPion (WELLBUTRIN XL) 300 MG 24 hr tablet Take 1 tablet (300 mg total) by mouth daily. 30 tablet 2   busPIRone (BUSPAR) 30 MG tablet Take 1 tablet (30 mg total) by mouth in the morning and at bedtime. 60 tablet 2   Dimethyl Fumarate (TECFIDERA) 240 MG CPDR Take 1 capsule (240 mg total) by mouth in the morning and at bedtime. 180 capsule 3   doxycycline (VIBRAMYCIN) 100 MG capsule Take 1 capsule (100 mg total) by mouth 2 (two) times daily. 14 capsule 0   FLUoxetine (PROZAC) 40 MG capsule Take 2 capsules (80 mg total) by mouth in the morning. 60 capsule 2   gabapentin (NEURONTIN) 600 MG  tablet Take 1 tablet (600 mg total) by mouth 4 (four) times daily. 120 tablet 0   gabapentin (NEURONTIN) 600 MG tablet Take 1 tablet (600 mg total) by mouth 4 (four) times daily. 120 tablet 1   ondansetron (ZOFRAN ODT) 4 MG disintegrating tablet Take 1 tablet (4 mg total) by mouth every 8 (eight) hours as needed for nausea or vomiting. 20 tablet 0   ondansetron (ZOFRAN) 4 MG tablet Take 1 tablet (4 mg total) by mouth every 8 (eight) hours as needed for nausea or vomiting. 20 tablet 0   pantoprazole (PROTONIX) 40 MG tablet Take 1 tablet (40 mg total) by mouth daily. 30 tablet 3   predniSONE (DELTASONE) 10 MG tablet Take 3 tablets (30 mg total) by mouth daily with breakfast. 15 tablet 0   sucralfate (CARAFATE) 1 GM/10ML suspension Take 10 mLs (1 g total) by mouth 4 (four) times daily -  with meals and at bedtime. 420 mL 0   Ubrogepant (UBRELVY) 100 MG TABS Take 100 mg by mouth daily as needed. Take one tablet at onset of headache, may repeat 1 tablet in 2 hours, no more than 2 tablets in 24 hours 24 tablet 3   No current facility-administered medications for this visit.     Musculoskeletal: Strength & Muscle Tone: Unable to assess due to telemedicine visit [place]: Unable to assess due to telemedicine visit Patient leans: Unable to assess due to telemedicine visit  Psychiatric Specialty Exam: Review of Systems  Psychiatric/Behavioral:  Positive for sleep disturbance. Negative for decreased concentration, dysphoric mood, hallucinations, self-injury and suicidal ideas. The patient is nervous/anxious. The patient is not hyperactive.     Last menstrual period 02/24/2022.There is no height or weight on file to calculate BMI.  General Appearance: Casual  Eye Contact: Casual  Speech:  Clear and Coherent and Normal Rate  Volume:  Normal  Mood:  Anxious  Affect:  Appropriate and Congruent  Thought Process:  Coherent, Goal Directed, and Descriptions of Associations: Intact  Orientation:   Full (Time, Place, and Person)  Thought Content: WDL   Suicidal Thoughts:  No  Homicidal Thoughts:  No  Memory:  Immediate;   Good Recent;   Good Remote;   Good  Judgement:  Good  Insight:  Good  Psychomotor Activity:  Normal  Concentration:  Concentration: Good and Attention Span:  Good  Recall:  Good  Fund of Knowledge: Good  Language: Good  Akathisia:  NA  Handed:  Right  AIMS (if indicated): not done  Assets:  Communication Skills Desire for Improvement Housing Social Support  ADL's:  Intact  Cognition: WNL  Sleep:  Fair   Screenings: AUDIT    Flowsheet Row Admission (Discharged) from 09/10/2020 in [facility]  Alcohol Use Disorder Identification Test Final Score (AUDIT) 0      GAD-7    Flowsheet Row Video Visit from 12/12/2021 in [facility] Video Visit from 12/07/2020 in [facility] Office Visit from 10/14/2020 in [facility] Office Visit from 11/11/2019 in [facility] at [place] from 06/17/2019 in [place] [facility]  Total GAD-7 Score 12 16 19 8  0      PHQ2-9    Flowsheet Row Video Visit from 12/12/2021 in [facility] Office Visit from 08/23/2021 in [place] [facility] [place] Office Visit from 06/21/2021 in [place] Video Visit from 12/07/2020 in [facility] Office Visit from 10/14/2020 in [place]  PHQ-2 Total Score 2 4 5 2 4   PHQ-9 Total Score 13 15 18 13 20       [facility] from 03/06/2022 in [facility] [place] [facility] from 02/06/2022 in [facility] [place] [facility] from 01/04/2022 in [facility] [place] No Risk No Risk No Risk        Assessment and Plan: Rusti JPolo Riley is a  26 year old female with a past psychiatric history significant for generalized anxiety disorder, major depressive disorder, and insomnia who presents to [facility] for follow-up and medication management.  Patient was last seen by Arlina Robes on 12/12/2020.  Patient 's depression and OCD symptoms are controlled with current medications.  She reports disturbed sleep and anxiety.  She has tried trazodone, hydroxyzine in the past with not much help. She is also on gabapentin 600 mg 4 times daily for neuropathic pain.  She does not remember the dose of trazodone she used in the past. She has been taking Seroquel to help with insomnia but still wakes up multiple times.  Denies any psychotic or manic/hypomanic symptoms or episodes.  Will stop Seroquel due to risk of side effects and start trazodone again to help with insomnia.   Collaboration of Care: Collaboration of Care: Medication Management AEB provider managing patient's psychiatric medications, Primary Care Provider AEB patient being seen by a primary care provider, and Psychiatrist AEB patient being seen by a mental health provider  Patient/Guardian was advised Release of Information must be obtained prior to any record release in order to collaborate their care with an outside provider. Patient/Guardian was advised if they have not already done so to contact the registration department to sign all necessary forms in order for [place] to release information regarding their care.   Consent: Patient/Guardian gives verbal consent for treatment and assignment of benefits for services provided during this visit. Patient/Guardian expressed understanding and agreed to proceed.   1. Generalized anxiety disorder  - FLUoxetine (PROZAC) 40 MG capsule; Take 2 capsules (80 mg total) by mouth in the morning.  Dispense: 60 capsule; Refill: 2 - busPIRone (BUSPAR) 30 MG tablet; Take 1 tablet (30 mg total) by mouth in the  morning and  at bedtime.  Dispense: 60 tablet; Refill: 2  2. Severe episode of recurrent major depressive disorder, without psychotic features (HCC)  - FLUoxetine (PROZAC) 40 MG capsule; Take 2 capsules (80 mg total) by mouth in the morning.  Dispense: 60 capsule; Refill: 2 - buPROPion (WELLBUTRIN XL) 300 MG 24 hr tablet; Take 1 tablet (300 mg total) by mouth daily.  Dispense: 30 tablet; Refill: 2  3. Insomnia, unspecified type -Stop QUEtiapine (SEROQUEL) - Not reporting any psychotic or manic symptoms. -Start trazodone 50 to 100 mg nightly as needed for insomnia.  30-day prescription with 2 refills sent to patient's pharmacy -Start OTC melatonin 3-6 mg as needed for insomnia.  Patient to follow up in 2 months Provider spent a total of 25 minutes with the patient/reviewing patient chart  Armando Reichert, MD 03/27/2022, 3:36 PM

## 2022-04-03 ENCOUNTER — Encounter (HOSPITAL_COMMUNITY): Payer: Self-pay | Admitting: Psychiatry

## 2022-04-17 NOTE — Progress Notes (Signed)
04/18/2022 ALL: Tracy Mcconnell returns for Botox. She reports doing well. She may have about 10 headache days a month with 2-3 being severe. She continues follow up with me and Dr [name] for MS.   01/24/2022 ALL: Tracy Mcconnell returns for Botox. She is doing well. Neck pain has improved. She is scheduled for MRI 02/09/2022. Migraines unchanged.   10/25/2021 ALL: Tracy Mcconnell returns for Botox. She is doing well. She has about 10 headache days a month with 2-3 migraines. Tracy Mcconnell works well.   08/02/2021 ALL: Tracy Mcconnell returns for Botox. She continues to do well. Last seen by Dr [name] 05/2021. MS stable on Tecfidera.   05/09/2021 ALL: She returns for Botox. She feels migraines are well managed. She may have 7-8 milder headaches per month but onlu 1-2 migraines. Tracy Mcconnell helps with abortive therapy. No new MS symptoms. She continues Tecfidera. She missed her follow up with Dr [name] 04/19/21.   02/14/2021 ALL: She continues to do well on Botox. Tracy Mcconnell helps with migraine abortion. She feels migraines are better She has about 10 headache days with 3 severe headaches monthly. She continues tecfidera. She has follow up with Tracy Mcconnell in January. She denies new or worsening symptoms.   11/03/2020: She continues to do well with Botox therapy. Migraines are well managed. Nurtec seems to work best for abortive therapy but not covered. She is using Ubrelvy that helps some. She was started on Tecfidera 09/2020 following concerns of 1 new focus on brain MRI. She is tolerating well and has follow up with Dr [name] 04/2021.   08/02/2020 ALL: She continues to note benefit of Botox in migraine management. She continues Ubrelvy for abortive therapy. She continues follow up with Dr [name]. Reports bilateral face, upper and lower extremity numbness on two separate occasions. No changes in MRI last performed 05/2020. She has follow up with Dr [name] in 12/2020.   04/21/2020 ALL: She returns today for fourth Botox procedure. She tolerated last procedure well  [place] Tracy Mcconnell). She does feel Botox is helping migraines. She does continue to have about 10 migraine days per month. She has failed sumatriptan and rizatriptan. She is taking gabapentin and indomethacin for back pain. Nurtec works well but not covered by [employer]. She has not tried [place]. 1 Nurtec sample and 2 Ubrelvy 50mg  samples given in office today.   She did have optic neuritis in March. MRI concerning for foci. LP performed and CSF normal. She is being followed by Dr [name] every 6 months with repeat imaging planned. She reports vision loss of left eye resolved. She has had some blurred/double vision bilaterally and is followed by Dr Tracy Mcconnell.    Consent Form Botulism Toxin Injection For Chronic Migraine    Reviewed orally with patient, additionally signature is on file:  Botulism toxin has been approved by the Federal drug administration for treatment of chronic migraine. Botulism toxin does not cure chronic migraine and it may not be effective in some patients.  The administration of botulism toxin is accomplished by injecting a small amount of toxin into the muscles of the neck and head. Dosage must be titrated for each individual. Any benefits resulting from botulism toxin tend to wear off after 3 months with a repeat injection required if benefit is to be maintained. Injections are usually done every 3-4 months with maximum effect peak achieved by about 2 or 3 weeks. Botulism toxin is expensive and you should be sure of what costs you will incur resulting from the injection.  The side effects of  botulism toxin use for chronic migraine may include:   -Transient, and usually mild, facial weakness with facial injections  -Transient, and usually mild, head or neck weakness with head/neck injections  -Reduction or loss of forehead facial animation due to forehead muscle weakness  -Eyelid drooping  -Dry eye  -Pain at the site of injection or bruising at the site of injection  -Double  vision  -Potential unknown long term risks   Contraindications: You should not have Botox if you are pregnant, nursing, allergic to albumin, have an infection, skin condition, or muscle weakness at the site of the injection, or have myasthenia gravis, Lambert-Eaton syndrome, or ALS.  It is also possible that as with any injection, there may be an allergic reaction or no effect from the medication. Reduced effectiveness after repeated injections is sometimes seen and rarely infection at the injection site may occur. All care will be taken to prevent these side effects. If therapy is given over a long time, atrophy and wasting in the muscle injected may occur. Occasionally the patient's become refractory to treatment because they develop antibodies to the toxin. In this event, therapy needs to be modified.  I have read the above information and consent to the administration of botulism toxin.    BOTOX PROCEDURE NOTE FOR MIGRAINE HEADACHE  Contraindications and precautions discussed with patient(above). Aseptic procedure was observed and patient tolerated procedure. Procedure performed by Debbora Presto, FNP-C.   The condition has existed for more than 6 months, and pt does not have a diagnosis of ALS, Myasthenia Gravis or Lambert-Eaton Syndrome.  Risks and benefits of injections discussed and pt agrees to proceed with the procedure.  Written consent obtained  These injections are medically necessary. Pt  receives good benefits from these injections. These injections do not cause sedations or hallucinations which the oral therapies may cause.   Description of procedure:  The patient was placed in a sitting position. The standard protocol was used for Botox as follows, with 5 units of Botox injected at each site:  -Procerus muscle, midline injection  -Corrugator muscle, bilateral injection  -Frontalis muscle, bilateral injection, with 2 sites each side, medial injection was performed in the upper  one third of the frontalis muscle, in the region vertical from the medial inferior edge of the superior orbital rim. The lateral injection was again in the upper one third of the forehead vertically above the lateral limbus of the cornea, 1.5 cm lateral to the medial injection site.  -Temporalis muscle injection, 4 sites, bilaterally. The first injection was 3 cm above the tragus of the ear, second injection site was 1.5 cm to 3 cm up from the first injection site in line with the tragus of the ear. The third injection site was 1.5-3 cm forward between the first 2 injection sites. The fourth injection site was 1.5 cm posterior to the second injection site. 5th site laterally in the temporalis  muscleat the level of the outer canthus.  -Occipitalis muscle injection, 3 sites, bilaterally. The first injection was done one half way between the occipital protuberance and the tip of the mastoid process behind the ear. The second injection site was done lateral and superior to the first, 1 fingerbreadth from the first injection. The third injection site was 1 fingerbreadth superiorly and medially from the first injection site.  -Cervical paraspinal muscle injection, 2 sites, bilaterally. The first injection site was 1 cm from the midline of the cervical spine, 3 cm inferior to the lower border  of the occipital protuberance. The second injection site was 1.5 cm superiorly and laterally to the first injection site.  -Trapezius muscle injection was performed at 3 sites, bilaterally. The first injection site was in the upper trapezius muscle halfway between the inflection point of the neck, and the acromion. The second injection site was one half way between the acromion and the first injection site. The third injection was done between the first injection site and the inflection point of the neck.   Will return for repeat injection in 3 months.   A total of 200 units of Botox was prepared, 155 units of Botox was  injected as documented above, any Botox not injected was wasted. The patient tolerated the procedure well, there were no complications of the above procedure.

## 2022-04-18 ENCOUNTER — Ambulatory Visit (INDEPENDENT_AMBULATORY_CARE_PROVIDER_SITE_OTHER): Payer: Medicare HMO | Admitting: Family Medicine

## 2022-04-18 DIAGNOSIS — G43709 Chronic migraine without aura, not intractable, without status migrainosus: Secondary | ICD-10-CM | POA: Diagnosis not present

## 2022-04-18 MED ORDER — ONABOTULINUMTOXINA 200 UNITS IJ SOLR
155.0000 [IU] | Freq: Once | INTRAMUSCULAR | Status: AC
  Administered 2022-04-18: 155 [IU] via INTRAMUSCULAR

## 2022-04-18 NOTE — Progress Notes (Signed)
Botox- 200 units x 1 vial Lot: [id number] Expiration: 08/2024 NDC: 0023-3921-02  Bacteriostatic 0.9% Sodium Chloride- 4mL total Lot: [id number] Expiration: 11/25 NDC: 0409-1966-02  Dx: G43.709 B/B  

## 2022-05-01 ENCOUNTER — Telehealth: Payer: Medicare HMO | Admitting: Nurse Practitioner

## 2022-05-01 DIAGNOSIS — J0140 Acute pansinusitis, unspecified: Secondary | ICD-10-CM | POA: Diagnosis not present

## 2022-05-01 MED ORDER — AMOXICILLIN-POT CLAVULANATE 875-125 MG PO TABS: 1.0000 | ORAL_TABLET | Freq: Two times a day (BID) | ORAL | 0 refills | Status: AC

## 2022-05-01 NOTE — Progress Notes (Signed)

## 2022-06-19 ENCOUNTER — Telehealth: Payer: Medicare HMO | Admitting: Physician Assistant

## 2022-06-19 DIAGNOSIS — R3989 Other symptoms and signs involving the genitourinary system: Secondary | ICD-10-CM

## 2022-06-19 MED ORDER — CEPHALEXIN 500 MG PO CAPS: 500.0000 mg | ORAL_CAPSULE | Freq: Two times a day (BID) | ORAL | 0 refills | Status: AC

## 2022-06-19 NOTE — Progress Notes (Signed)

## 2022-06-19 NOTE — Progress Notes (Signed)
I have spent 5 minutes in review of e-visit questionnaire, review and updating patient chart, medical decision making and response to patient.   Emmilynn Marut Cody Rhemi Balbach, PA-C    

## 2022-06-21 ENCOUNTER — Telehealth: Payer: Self-pay | Admitting: Family Medicine

## 2022-06-21 NOTE — Telephone Encounter (Signed)
Could you please check if Tracy Mcconnell is needed under [facility]? It looks like she just got it in January.

## 2022-06-27 ENCOUNTER — Other Ambulatory Visit (HOSPITAL_COMMUNITY): Payer: Self-pay

## 2022-06-27 NOTE — Telephone Encounter (Signed)
Benefit Verification [id number] Submitted!

## 2022-07-03 ENCOUNTER — Other Ambulatory Visit (HOSPITAL_COMMUNITY): Payer: Self-pay

## 2022-07-03 NOTE — Telephone Encounter (Signed)
Pharmacy Patient Advocate Encounter   Received notification from [facility] that prior authorization for Botox 200 Units is required/requested.   PA submitted on 07/03/2022 to (ins) [employer] via [place] or (Medicaid) confirmation # [id number] Status is pending

## 2022-07-04 ENCOUNTER — Telehealth: Payer: Medicare HMO | Admitting: Physician Assistant

## 2022-07-04 DIAGNOSIS — M549 Dorsalgia, unspecified: Secondary | ICD-10-CM

## 2022-07-04 NOTE — Progress Notes (Signed)
Because of having severe back pain, I feel your condition warrants further evaluation and I recommend that you be seen in a face to face visit.   NOTE: There will be NO CHARGE for this eVisit   If you are having a true medical emergency please call 911.      For an urgent face to face visit, [facility] has eight urgent care centers for your convenience:   NEW!! [facility] [place] [facility] [place] Get Driving Directions [id number] [address] [place], [place] [facility] [place] [facility] [place] Get Driving Directions [id number] [place], [place] [address]   [place] [facility] [place]) Get Driving Directions [id number] [address], [place] [address]  [place] [facility] [place] ([place]) Get Driving Directions [id number] [address] [employer] [place],  [place]  [address]  [place] [facility] [place] [facility] - at [name] Get Driving Directions  [phone] [address] [place],  [place] [address]   [facility] Get Driving Directions [id number] [address] [place], [place], [place] [address]   [place] Get Driving Directions  [id number] [address] [place], [place] [address]    [facility] at [place] Get Driving Directions [id number] [address] [place], [place] [address]  Your MyChart E-visit questionnaire answers were reviewed by a board certified advanced clinical practitioner to complete your personal care plan based on your specific symptoms.  Thank you for using e-Visits.   I have spent 5 minutes in review of e-visit questionnaire, review and updating patient chart, medical decision making and response to patient.   Mar Daring, PA-C

## 2022-07-04 NOTE — Telephone Encounter (Addendum)
Pharmacy Patient Advocate Encounter- Botox BIV-Medical Benefit:  J code: [id number] CPT code: [phone] Dx Code: G43.709  PA was submitted to [facility] and has been approved through: 04/02/2023 Authorization# [id number]  Patient Is Not eligible for Botox Copay Card. Copay Card can make patient's cost as little as zero. Copay card will be provided to pharmacy.   [employer] and Bill  [employer] has a new pilot program that just started that if the PT would like to discuss expected copay/financial expectations they can outreach one of their insurance specialist. Please see the information in the previous note that has the contact information.

## 2022-07-10 ENCOUNTER — Ambulatory Visit: Payer: Medicare Other | Admitting: Family Medicine

## 2022-07-11 ENCOUNTER — Ambulatory Visit (INDEPENDENT_AMBULATORY_CARE_PROVIDER_SITE_OTHER): Payer: Medicare HMO | Admitting: Family Medicine

## 2022-07-11 DIAGNOSIS — G43709 Chronic migraine without aura, not intractable, without status migrainosus: Secondary | ICD-10-CM | POA: Diagnosis not present

## 2022-07-11 MED ORDER — ONABOTULINUMTOXINA 200 UNITS IJ SOLR
155.0000 [IU] | Freq: Once | INTRAMUSCULAR | Status: AC
  Administered 2022-07-11: 155 [IU] via INTRAMUSCULAR

## 2022-07-11 NOTE — Progress Notes (Signed)
Botox- 200 units x 1 vial [id number] Expiration: 06/206 NDC: 9509-3267-12  Bacteriostatic 0.9% Sodium Chloride- 4 mL total [id number] Expiration: 11/25 NDC: 25053-976-73  Dx: G43.709 B/B Witnessed by: Valetta Close.

## 2022-07-11 NOTE — Progress Notes (Signed)
07/11/2022 ALL: Tracy Mcconnell returns for Botox. Tracy Mcconnell continues to do very well. Tracy RaisinUbrelvy works well for abortive therapy.   04/18/2022 ALL: Tracy Mcconnell returns for Botox. Tracy Mcconnell reports doing well. Tracy Mcconnell may have about 10 headache days a month with 2-3 being severe. Tracy Mcconnell continues follow up with me and Dr Epimenio FootSater for MS.   01/24/2022 ALL: Tracy Mcconnell returns for Botox. Tracy Mcconnell is doing well. Neck pain has improved. Tracy Mcconnell is scheduled for MRI 02/09/2022. Migraines unchanged.   10/25/2021 ALL: Tracy Mcconnell returns for Botox. Tracy Mcconnell is doing well. Tracy Mcconnell has about 10 headache days a month with 2-3 migraines. Tracy RaisinUbrelvy works well.   08/02/2021 ALL: Tracy Mcconnell returns for Botox. Tracy Mcconnell continues to do well. Last seen by Dr Epimenio FootSater 05/2021. MS stable on Tecfidera.   05/09/2021 ALL: Tracy Mcconnell returns for Botox. Tracy Mcconnell feels migraines are well managed. Tracy Mcconnell may have 7-8 milder headaches per month but onlu 1-2 migraines. Tracy RaisinUbrelvy helps with abortive therapy. No new MS symptoms. Tracy Mcconnell continues Tecfidera. Tracy Mcconnell missed her follow up with Dr Epimenio FootSater 04/19/21.   02/14/2021 ALL: Tracy Mcconnell continues to do well on Botox. Tracy RaisinUbrelvy helps with migraine abortion. Tracy Mcconnell feels migraines are better Tracy Mcconnell has about 10 headache days with 3 severe headaches monthly. Tracy Mcconnell continues tecfidera. Tracy Mcconnell has follow up with Sater in January. Tracy Mcconnell denies new or worsening symptoms.   11/03/2020: Tracy Mcconnell continues to do well with Botox therapy. Migraines are well managed. Nurtec seems to work best for abortive therapy but not covered. Tracy Mcconnell is using Ubrelvy that helps some. Tracy Mcconnell was started on Tecfidera 09/2020 following concerns of 1 new focus on brain MRI. Tracy Mcconnell is tolerating well and has follow up with Dr Epimenio FootSater 04/2021.   08/02/2020 ALL: Tracy Mcconnell continues to note benefit of Botox in migraine management. Tracy Mcconnell continues Ubrelvy for abortive therapy. Tracy Mcconnell continues follow up with Dr Epimenio FootSater. Reports bilateral face, upper and lower extremity numbness on two separate occasions. No changes in MRI last performed 05/2020. Tracy Mcconnell has follow up with Dr  Epimenio FootSater in 12/2020.   04/21/2020 ALL: Tracy Mcconnell returns today for fourth Botox procedure. Tracy Mcconnell tolerated last procedure well Tracy Penny(Megan [place]). Tracy Mcconnell does feel Botox is helping migraines. Tracy Mcconnell does continue to have about 10 migraine days per month. Tracy Mcconnell has failed sumatriptan and rizatriptan. Tracy Mcconnell is taking gabapentin and indomethacin for back pain. Nurtec works well but not covered by [employer]. Tracy Mcconnell has not tried Vanuatubrelvy. 1 Nurtec sample and 2 Ubrelvy 50mg  samples given in office today.   Tracy Mcconnell did have optic neuritis in March. MRI concerning for foci. LP performed and CSF normal. Tracy Mcconnell is being followed by Dr Epimenio FootSater every 6 months with repeat imaging planned. Tracy Mcconnell reports vision loss of left eye resolved. Tracy Mcconnell has had some blurred/double vision bilaterally and is followed by Dr Dione BoozeGroat.    Consent Form Botulism Toxin Injection For Chronic Migraine    Reviewed orally with patient, additionally signature is on file:  Botulism toxin has been approved by the Federal drug administration for treatment of chronic migraine. Botulism toxin does not cure chronic migraine and it may not be effective in some patients.  The administration of botulism toxin is accomplished by injecting a small amount of toxin into the muscles of the neck and head. Dosage must be titrated for each individual. Any benefits resulting from botulism toxin tend to wear off after 3 months with a repeat injection required if benefit is to be maintained. Injections are usually done every 3-4 months with maximum effect peak achieved by about 2 or 3 weeks. Botulism toxin is expensive  and you should be sure of what costs you will incur resulting from the injection.  The side effects of botulism toxin use for chronic migraine may include:   -Transient, and usually mild, facial weakness with facial injections  -Transient, and usually mild, head or neck weakness with head/neck injections  -Reduction or loss of forehead facial animation due to forehead muscle  weakness  -Eyelid drooping  -Dry eye  -Pain at the site of injection or bruising at the site of injection  -Double vision  -Potential unknown long term risks   Contraindications: You should not have Botox if you are pregnant, nursing, allergic to albumin, have an infection, skin condition, or muscle weakness at the site of the injection, or have myasthenia gravis, Lambert-Eaton syndrome, or ALS.  It is also possible that as with any injection, there may be an allergic reaction or no effect from the medication. Reduced effectiveness after repeated injections is sometimes seen and rarely infection at the injection site may occur. All care will be taken to prevent these side effects. If therapy is given over a long time, atrophy and wasting in the muscle injected may occur. Occasionally the patient's become refractory to treatment because they develop antibodies to the toxin. In this event, therapy needs to be modified.  I have read the above information and consent to the administration of botulism toxin.    BOTOX PROCEDURE NOTE FOR MIGRAINE HEADACHE  Contraindications and precautions discussed with patient(above). Aseptic procedure was observed and patient tolerated procedure. Procedure performed by Shawnie Dapper, FNP-C.   The condition has existed for more than 6 months, and pt does not have a diagnosis of ALS, Myasthenia Gravis or Lambert-Eaton Syndrome.  Risks and benefits of injections discussed and pt agrees to proceed with the procedure.  Written consent obtained  These injections are medically necessary. Pt  receives good benefits from these injections. These injections do not cause sedations or hallucinations which the oral therapies may cause.   Description of procedure:  The patient was placed in a sitting position. The standard protocol was used for Botox as follows, with 5 units of Botox injected at each site:  -Procerus muscle, midline injection  -Corrugator muscle, bilateral  injection  -Frontalis muscle, bilateral injection, with 2 sites each side, medial injection was performed in the upper one third of the frontalis muscle, in the region vertical from the medial inferior edge of the superior orbital rim. The lateral injection was again in the upper one third of the forehead vertically above the lateral limbus of the cornea, 1.5 cm lateral to the medial injection site.  -Temporalis muscle injection, 4 sites, bilaterally. The first injection was 3 cm above the tragus of the ear, second injection site was 1.5 cm to 3 cm up from the first injection site in line with the tragus of the ear. The third injection site was 1.5-3 cm forward between the first 2 injection sites. The fourth injection site was 1.5 cm posterior to the second injection site. 5th site laterally in the temporalis  muscleat the level of the outer canthus.  -Occipitalis muscle injection, 3 sites, bilaterally. The first injection was done one half way between the occipital protuberance and the tip of the mastoid process behind the ear. The second injection site was done lateral and superior to the first, 1 fingerbreadth from the first injection. The third injection site was 1 fingerbreadth superiorly and medially from the first injection site.  -Cervical paraspinal muscle injection, 2 sites, bilaterally. The  first injection site was 1 cm from the midline of the cervical spine, 3 cm inferior to the lower border of the occipital protuberance. The second injection site was 1.5 cm superiorly and laterally to the first injection site.  -Trapezius muscle injection was performed at 3 sites, bilaterally. The first injection site was in the upper trapezius muscle halfway between the inflection point of the neck, and the acromion. The second injection site was one half way between the acromion and the first injection site. The third injection was done between the first injection site and the inflection point of the  neck.   Will return for repeat injection in 3 months.   A total of 200 units of Botox was prepared, 155 units of Botox was injected as documented above, any Botox not injected was wasted. The patient tolerated the procedure well, there were no complications of the above procedure.

## 2022-07-18 NOTE — Progress Notes (Signed)
PATIENT: Tracy Mcconnell DOB: 11-12-1995  REASON FOR VISIT: follow up HISTORY FROM: patient  Chief Complaint  Patient presents with   Follow-up    RM 1, alone. Last seen 07/11/22 for Botox with Tracy Herrada,NP.   MS DMT: dimethyl fumarate. Tolerating well.  She is overheating easily due to increased warmer weather. Sent her link via mychart for cooling options: [email or web address]    HISTORY OF PRESENT ILLNESS:  07/24/22 ALL: Tracy Mcconnell returns for follow up for MS and migraines. She continues dimethyl fumarate and tolerating well. Labs have been stable. Last imaging 01/2022 brain and cervical spine stable.   She is doing well. No new or worsening symptoms. No vision changes. She feels gait is fairly stable. She feels left foot hits right foot at times. No falls. No assistive device. She walks about a mile every morning.   She continues Botox and Ubrelvy for migraine management. She has about 4-8 migraines a month. Tracy Mcconnell usually aborts migraine with two doses.   She feels mood is stable. She continues follow up with Dr Tracy Mcconnell, psychiatry. Quetiapine switched to trazodone 50mg  QHS. She is sleeping well. She has a new apartment. She is living with her friend. She was able to get a car. She denies difficulty driving.   Vision is stable. She has a hard time focusing in the dark or in the rain. She is scheduled to see ophthalmology soon.    History (copied from Dr Tracy Mcconnell previous note)  Tracy Mcconnell is a 27 y.o. woman with left optic neuritis and abnormal brain MRi   Update 06/29/2021 She had a concussion a couple weeks ago, hiting her head on the pullout freezer drawer.    There was no LOc but she had a headache x a few days afterwards.    She is better now.      She is on DMF and tolerates it well.  She has left > right leg weakness.    She has noted she is tripping more.  She notes some left arm > foot numbness with tingling but not that  uncomfortable.  She will have a Lhermitte sign sometimes she flexes neck forward.     The 02/25/2021 MRI of the brain and cervical spine showed no acute or new findings.  The previous 2 MRIs did not show any new lesions.      She had mild visual changes with the concussion.  .   She had left ON and occasioanlly she notes left visula blurring.   COlors are desaturated OS.       Migraines are doing fairly well on Botox.  She is on Ubrelvy when one occurs.   Nurtec works better than [place] but insurance will not cover it.    Bright lights or sunlight triggers a migraine.   Two headaches the past week had N/V.       Multiple sclerosis/ History: In February 2021, she had the onset of left eye blurry vision and pain, worse with eye movements.    She saw urgent care and then ophthalmology   She was felt to have optic neuritis and presented to the [facility] ED.   MRI of the brain and orbits showed enhancement of the left optic nerve,   There were a few T2 hyperintense foci in the hemispheres, including a periventricular focus in the right frontal lobe and one juxtacortical focus in the right parietal lobe.  None of the brain foci enhanced.  In retrospect, she has numbness  and weakness in her legs that lasted a week, left > right in November 2019.   She had an LP 07/01/2019 and CSF showed no OCB and normal IgG Index    Imaging: MRI of the brain 10/15/2020 was unchanged   MRI cervical spine 10/15/2020 was normal   MRI of the brain 05/23/2020 showed scattered T2/FLAIR hyperintense foci including 2 in the periventricular white matter and 1 in the juxtacortical white matter.  None of the foci enhance.  Compared to the MRI of the brain from 12/24/2019 and 06/10/2019, there are no new lesions.   MRI of the cervical spine 05/23/2020 showed a normal spinal cord.  No degenerative changes.   MRI of the orbits 05/23/2020 showed no enhancing lesions.  There was slight hyperintensity of the left optic nerve consistent with  the previous optic neuritis.   Her maternal aunt and two second cousins have MS.  REVIEW OF SYSTEMS: Out of a complete 14 system review of symptoms, the patient complains only of the following symptoms, headaches, anxiety, depression  and all other reviewed systems are negative.  ALLERGIES: Allergies  Allergen Reactions   Sumatriptan Shortness Of Breath and Other (See Comments)    Entire body "felt heavy"   Other Itching, Swelling and Other (See Comments)    Pesticides (family history)- Exposure face and mouth itch and the tongue/lips swell    HOME MEDICATIONS: Outpatient Medications Prior to Visit  Medication Sig Dispense Refill   buPROPion (WELLBUTRIN XL) 300 MG 24 hr tablet Take 1 tablet (300 mg total) by mouth daily. 30 tablet 2   busPIRone (BUSPAR) 30 MG tablet Take 1 tablet (30 mg total) by mouth in the morning and at bedtime. 60 tablet 2   Dimethyl Fumarate (TECFIDERA) 240 MG CPDR Take 1 capsule (240 mg total) by mouth in the morning and at bedtime. 180 capsule 3   FLUoxetine (PROZAC) 40 MG capsule Take 2 capsules (80 mg total) by mouth in the morning. 60 capsule 2   ondansetron (ZOFRAN ODT) 4 MG disintegrating tablet Take 1 tablet (4 mg total) by mouth every 8 (eight) hours as needed for nausea or vomiting. 20 tablet 0   ondansetron (ZOFRAN) 4 MG tablet Take 1 tablet (4 mg total) by mouth every 8 (eight) hours as needed for nausea or vomiting. 20 tablet 0   pantoprazole (PROTONIX) 40 MG tablet Take 1 tablet (40 mg total) by mouth daily. 30 tablet 3   sucralfate (CARAFATE) 1 GM/10ML suspension Take 10 mLs (1 g total) by mouth 4 (four) times daily -  with meals and at bedtime. 420 mL 0   traZODone (DESYREL) 50 MG tablet Take 1 tablet (50 mg total) by mouth at bedtime as needed for sleep (1-2 tabs as needed). 60 tablet 2   gabapentin (NEURONTIN) 600 MG tablet Take 1 tablet (600 mg total) by mouth 4 (four) times daily. 120 tablet 1   Ubrogepant (UBRELVY) 100 MG TABS Take 100 mg by  mouth daily as needed. Take one tablet at onset of headache, may repeat 1 tablet in 2 hours, no more than 2 tablets in 24 hours 24 tablet 3   gabapentin (NEURONTIN) 600 MG tablet Take 1 tablet (600 mg total) by mouth 4 (four) times daily. 120 tablet 0   No facility-administered medications prior to visit.    PAST MEDICAL HISTORY: Past Medical History:  Diagnosis Date   Anemia    Anxiety    Depression    GERD (gastroesophageal reflux disease)  Hypovitaminosis D 04/07/2019   Left eye complaint 06/08/2019   Migraines    MS (multiple sclerosis)     PAST SURGICAL HISTORY: Past Surgical History:  Procedure Laterality Date   CHOLECYSTECTOMY N/A 10/19/2019   Procedure: LAPAROSCOPIC CHOLECYSTECTOMY;  Surgeon: Berna Bue, MD;  Location: [facility];  Service: General;  Laterality: N/A;   LAPAROSCOPIC APPENDECTOMY N/A 01/17/2020   Procedure: APPENDECTOMY LAPAROSCOPIC;  Surgeon: Harriette Bouillon, MD;  Location: [facility];  Service: General;  Laterality: N/A;    FAMILY HISTORY: Family History  Problem Relation Age of Onset   Migraines Mother    Thyroid disease Mother    Cancer Maternal Grandmother    Hypertension Maternal Grandfather    Rheum arthritis Paternal Grandmother    Diabetes Paternal Grandmother    Stroke Paternal Grandmother    Diabetes Paternal Grandfather     SOCIAL HISTORY: Social History   Socioeconomic History   Marital status: Single    Spouse name: Not on file   Number of children: 0   Years of education: college student   Highest education level: Not on file  Occupational History   Not on file  Tobacco Use   Smoking status: Never   Smokeless tobacco: Never  Vaping Use   Vaping Use: Never used  Substance and Sexual Activity   Alcohol use: Not Currently   Drug use: No   Sexual activity: Yes    Birth control/protection: None  Other Topics Concern   Not on file  Social History Narrative   Lives at home alone   Right handed   Does not drink caffeine,  makes her nauseated   Social Determinants of Health   Financial Resource Strain: Not on file  Food Insecurity: Not on file  Transportation Needs: No Transportation Needs (02/04/2017)   PRAPARE - Administrator, Civil Service (Medical): No    Lack of Transportation (Non-Medical): No  Physical Activity: Not on file  Stress: Not on file  Social Connections: Not on file  Intimate Partner Violence: Not on file      PHYSICAL EXAM  Vitals:   07/24/22 1502  BP: 112/77  Pulse: 90  Weight: 111 lb 12.8 oz (50.7 kg)  Height: 5' (1.524 m)     Body mass index is 21.83 kg/m.  Generalized: Well developed, in no acute distress  Cardiology: normal rate and rhythm, no murmur noted Neurological examination  Mentation: Alert oriented to time, place, history taking. Follows all commands speech and language fluent Cranial nerve II-XII: Pupils were equal round reactive to light. Extraocular movements were full, visual field were full on confrontational test. Facial sensation and strength were normal. Uvula tongue midline. Head turning and shoulder shrug  were normal and symmetric. Motor: The motor testing reveals 5 over 5 strength of all 4 extremities. No weakness noted. Good symmetric motor tone is noted throughout.  Sensation: decreased to soft touch of right arm Coordination: finger to nose and heel to shin normal, bilaterally  Gait and station: Gait is normal.  DTR: brisk but symmetric bilaterally in upper and lower ext.    DIAGNOSTIC DATA (LABS, IMAGING, TESTING) - I reviewed patient records, labs, notes, testing and imaging myself where available.      No data to display           Lab Results  Component Value Date   WBC 8.3 01/01/2022   HGB 12.4 01/01/2022   HCT 36.6 01/01/2022   MCV 92 01/01/2022   PLT 317 01/01/2022  Component Value Date/Time   NA 139 08/23/2021 1059   NA 140 12/28/2019 1655   K 3.5 08/23/2021 1059   CL 101 08/23/2021 1059   CO2 29  08/23/2021 1059   GLUCOSE 86 08/23/2021 1059   BUN 8 08/23/2021 1059   BUN 9 12/28/2019 1655   CREATININE 0.62 08/23/2021 1059   CALCIUM 9.9 08/23/2021 1059   PROT 7.2 01/01/2022 1514   ALBUMIN 4.6 01/01/2022 1514   ALBUMIN 4.2 07/01/2019 0919   AST 17 01/01/2022 1514   ALT 7 01/01/2022 1514   ALKPHOS 67 01/01/2022 1514   BILITOT <0.2 01/01/2022 1514   GFRNONAA >60 02/24/2021 2319   GFRAA 138 12/28/2019 1655   Lab Results  Component Value Date   CHOL 192 09/09/2020   HDL 50 09/09/2020   LDLCALC 128 (H) 09/09/2020   TRIG 68 09/09/2020   CHOLHDL 3.8 09/09/2020   Lab Results  Component Value Date   HGBA1C 5.2 09/11/2020   Lab Results  Component Value Date   VITAMINB12 553 06/17/2019   Lab Results  Component Value Date   TSH 0.88 08/23/2021    ASSESSMENT AND PLAN 27 y.o. year old female  has a past medical history of Anemia, Anxiety, Depression, GERD (gastroesophageal reflux disease), Hypovitaminosis D (04/07/2019), Left eye complaint (06/08/2019), Migraines, and MS (multiple sclerosis). here with     ICD-10-CM   1. Multiple sclerosis  G35 CBC with Differential/Platelets    Hepatic Function Panel    2. Chronic migraine without aura without status migrainosus, not intractable  G43.709 Ubrogepant (UBRELVY) 100 MG TABS    3. High risk medication use  Z79.899     4. History of optic neuritis  Z86.69       Aviah continues to do well on dimethyl fumarate. Imaging stable 01/2022. We will update labs, today. She will continue dimethyl fumerate, gabapentin, Botox and Ubrelvy as prescribed. May consider cooling vest. Info provided in Mychart. She will continue close follow up with psychiatry, ophthalmology and PCP. Adequate hydration and regular exercise advised. She will return to see me in 6 months. She verbalizes understanding and agreement with this plan.   Orders Placed This Encounter  Procedures   CBC with Differential/Platelets   Hepatic Function Panel     Meds  ordered this encounter  Medications   gabapentin (NEURONTIN) 600 MG tablet    Sig: Take 1 tablet (600 mg total) by mouth 4 (four) times daily.    Dispense:  120 tablet    Refill:  5    Order Specific Question:   Supervising Provider    Answer:   Anson Fret [[id number]]   Ubrogepant (UBRELVY) 100 MG TABS    Sig: Take 1 tablet (100 mg total) by mouth daily as needed. Take one tablet at onset of headache, may repeat 1 tablet in 2 hours, no more than 2 tablets in 24 hours    Dispense:  24 tablet    Refill:  3    90 day supply if possible    Order Specific Question:   Supervising Provider    Answer:   Anson Fret [[id number]]     I spent 30 minutes of face-to-face and non-face-to-face time with patient.  This included previsit chart review, lab review, study review, order entry, electronic health record documentation, patient education.   Shawnie Dapper, FNP-C 07/24/2022, 3:37 PM [facility] [address], [address] [place], [place] [address] [phone]

## 2022-07-18 NOTE — Patient Instructions (Signed)
Below is our plan:  We will continue dimethyl fumerate. Continue gabapentin and Ubrelvy as prescribed. Continue Botox every 12 weeks.   Please make sure you are staying well hydrated. I recommend 50-60 ounces daily. Well balanced diet and regular exercise encouraged. Consistent sleep schedule with 6-8 hours recommended.   Please continue follow up with care team as directed.   Follow up with Dr Epimenio Foot in 6 months   You may receive a survey regarding today's visit. I encourage you to leave honest feed back as I do use this information to improve patient care. Thank you for seeing me today!

## 2022-07-24 ENCOUNTER — Encounter: Payer: Self-pay | Admitting: Family Medicine

## 2022-07-24 ENCOUNTER — Encounter: Payer: Self-pay | Admitting: *Deleted

## 2022-07-24 ENCOUNTER — Ambulatory Visit (INDEPENDENT_AMBULATORY_CARE_PROVIDER_SITE_OTHER): Payer: Medicare HMO | Admitting: Family Medicine

## 2022-07-24 VITALS — BP 112/77 | HR 90 | Ht 60.0 in | Wt 111.8 lb

## 2022-07-24 DIAGNOSIS — Z8669 Personal history of other diseases of the nervous system and sense organs: Secondary | ICD-10-CM

## 2022-07-24 DIAGNOSIS — Z79899 Other long term (current) drug therapy: Secondary | ICD-10-CM | POA: Diagnosis not present

## 2022-07-24 DIAGNOSIS — G43709 Chronic migraine without aura, not intractable, without status migrainosus: Secondary | ICD-10-CM

## 2022-07-24 DIAGNOSIS — G35 Multiple sclerosis: Secondary | ICD-10-CM

## 2022-07-24 MED ORDER — GABAPENTIN 600 MG PO TABS: 600.0000 mg | ORAL_TABLET | Freq: Four times a day (QID) | ORAL | 5 refills | Status: AC

## 2022-07-24 MED ORDER — UBRELVY 100 MG PO TABS: 100.0000 mg | ORAL_TABLET | Freq: Every day | ORAL | 3 refills | Status: DC | PRN

## 2022-07-25 LAB — CBC WITH DIFFERENTIAL/PLATELET
Basophils Absolute: 0 10*3/uL (ref 0.0–0.2)
Basos: 0 %
EOS (ABSOLUTE): 0.1 10*3/uL (ref 0.0–0.4)
Eos: 1 %
Hematocrit: 43.7 % (ref 34.0–46.6)
Hemoglobin: 14.7 g/dL (ref 11.1–15.9)
Immature Grans (Abs): 0 10*3/uL (ref 0.0–0.1)
Immature Granulocytes: 0 %
Lymphocytes Absolute: 1.6 10*3/uL (ref 0.7–3.1)
Lymphs: 21 %
MCH: 30.4 pg (ref 26.6–33.0)
MCHC: 33.6 g/dL (ref 31.5–35.7)
MCV: 90 fL (ref 79–97)
Monocytes Absolute: 0.4 10*3/uL (ref 0.1–0.9)
Monocytes: 5 %
Neutrophils Absolute: 5.6 10*3/uL (ref 1.4–7.0)
Neutrophils: 73 %
Platelets: 349 10*3/uL (ref 150–450)
RBC: 4.84 x10E6/uL (ref 3.77–5.28)
RDW: 12.2 % (ref 11.7–15.4)
WBC: 7.8 10*3/uL (ref 3.4–10.8)

## 2022-07-25 LAB — HEPATIC FUNCTION PANEL
ALT: 9 IU/L (ref 0–32)
AST: 16 IU/L (ref 0–40)
Albumin: 4.5 g/dL (ref 4.0–5.0)
Alkaline Phosphatase: 84 IU/L (ref 44–121)
Bilirubin Total: 0.4 mg/dL (ref 0.0–1.2)
Bilirubin, Direct: 0.12 mg/dL (ref 0.00–0.40)
Total Protein: 7 g/dL (ref 6.0–8.5)

## 2022-08-16 ENCOUNTER — Telehealth: Payer: Self-pay | Admitting: *Deleted

## 2022-08-16 NOTE — Telephone Encounter (Signed)
Received fax from [employer] that PA approved until 04/02/23.  

## 2022-08-16 NOTE — Telephone Encounter (Signed)
Submitted PA Ubrelvy on covermymeds. Key: [id number]. Waiting on determination from [facility].

## 2022-10-09 NOTE — Progress Notes (Deleted)
10/10/2022 ALL: Tracy Mcconnell returns for Botox.   07/11/2022 ALL: Tracy Mcconnell returns for Botox. She continues to do very well. Tracy Mcconnell works well for abortive therapy.   04/18/2022 ALL: Tracy Mcconnell returns for Botox. She reports doing well. She may have about 10 headache days a month with 2-3 being severe. She continues follow up with me and Dr Epimenio Foot for MS.   01/24/2022 ALL: Tracy Mcconnell returns for Botox. She is doing well. Neck pain has improved. She is scheduled for MRI 02/09/2022. Migraines unchanged.   10/25/2021 ALL: Tracy Mcconnell returns for Botox. She is doing well. She has about 10 headache days a month with 2-3 migraines. Tracy Mcconnell works well.   08/02/2021 ALL: Tracy Mcconnell returns for Botox. She continues to do well. Last seen by Dr Epimenio Foot 05/2021. MS stable on Tecfidera.   05/09/2021 ALL: She returns for Botox. She feels migraines are well managed. She may have 7-8 milder headaches per month but onlu 1-2 migraines. Tracy Mcconnell helps with abortive therapy. No new MS symptoms. She continues Tecfidera. She missed her follow up with Dr Epimenio Foot 04/19/21.   02/14/2021 ALL: She continues to do well on Botox. Tracy Mcconnell helps with migraine abortion. She feels migraines are better She has about 10 headache days with 3 severe headaches monthly. She continues tecfidera. She has follow up with [facility] in January. She denies new or worsening symptoms.   11/03/2020: She continues to do well with Botox therapy. Migraines are well managed. Nurtec seems to work best for abortive therapy but not covered. She is using Ubrelvy that helps some. She was started on Tecfidera 09/2020 following concerns of 1 new focus on brain MRI. She is tolerating well and has follow up with Dr Epimenio Foot 04/2021.   08/02/2020 ALL: She continues to note benefit of Botox in migraine management. She continues Ubrelvy for abortive therapy. She continues follow up with Dr Epimenio Foot. Reports bilateral face, upper and lower extremity numbness on two separate occasions. No changes in MRI last  performed 05/2020. She has follow up with Dr Epimenio Foot in 12/2020.   04/21/2020 ALL: She returns today for fourth Botox procedure. She tolerated last procedure well [name]). She does feel Botox is helping migraines. She does continue to have about 10 migraine days per month. She has failed sumatriptan and rizatriptan. She is taking gabapentin and indomethacin for back pain. Nurtec works well but not covered by [employer]. She has not tried [place]. 1 Nurtec sample and 2 Ubrelvy 50mg  samples given in office today.   She did have optic neuritis in March. MRI concerning for foci. LP performed and CSF normal. She is being followed by Dr Epimenio Foot every 6 months with repeat imaging planned. She reports vision loss of left eye resolved. She has had some blurred/double vision bilaterally and is followed by Dr Dione Booze.    Consent Form Botulism Toxin Injection For Chronic Migraine    Reviewed orally with patient, additionally signature is on file:  Botulism toxin has been approved by the Federal drug administration for treatment of chronic migraine. Botulism toxin does not cure chronic migraine and it may not be effective in some patients.  The administration of botulism toxin is accomplished by injecting a small amount of toxin into the muscles of the neck and head. Dosage must be titrated for each individual. Any benefits resulting from botulism toxin tend to wear off after 3 months with a repeat injection required if benefit is to be maintained. Injections are usually done every 3-4 months with maximum effect peak achieved by about  2 or 3 weeks. Botulism toxin is expensive and you should be sure of what costs you will incur resulting from the injection.  The side effects of botulism toxin use for chronic migraine may include:   -Transient, and usually mild, facial weakness with facial injections  -Transient, and usually mild, head or neck weakness with head/neck injections  -Reduction or loss of forehead  facial animation due to forehead muscle weakness  -Eyelid drooping  -Dry eye  -Pain at the site of injection or bruising at the site of injection  -Double vision  -Potential unknown long term risks   Contraindications: You should not have Botox if you are pregnant, nursing, allergic to albumin, have an infection, skin condition, or muscle weakness at the site of the injection, or have myasthenia gravis, Lambert-Eaton syndrome, or ALS.  It is also possible that as with any injection, there may be an allergic reaction or no effect from the medication. Reduced effectiveness after repeated injections is sometimes seen and rarely infection at the injection site may occur. All care will be taken to prevent these side effects. If therapy is given over a long time, atrophy and wasting in the muscle injected may occur. Occasionally the patient's become refractory to treatment because they develop antibodies to the toxin. In this event, therapy needs to be modified.  I have read the above information and consent to the administration of botulism toxin.    BOTOX PROCEDURE NOTE FOR MIGRAINE HEADACHE  Contraindications and precautions discussed with patient(above). Aseptic procedure was observed and patient tolerated procedure. Procedure performed by Shawnie Dapper, FNP-C.   The condition has existed for more than 6 months, and pt does not have a diagnosis of ALS, Myasthenia Gravis or Lambert-Eaton Syndrome.  Risks and benefits of injections discussed and pt agrees to proceed with the procedure.  Written consent obtained  These injections are medically necessary. Pt  receives good benefits from these injections. These injections do not cause sedations or hallucinations which the oral therapies may cause.   Description of procedure:  The patient was placed in a sitting position. The standard protocol was used for Botox as follows, with 5 units of Botox injected at each site:  -Procerus muscle, midline  injection  -Corrugator muscle, bilateral injection  -Frontalis muscle, bilateral injection, with 2 sites each side, medial injection was performed in the upper one third of the frontalis muscle, in the region vertical from the medial inferior edge of the superior orbital rim. The lateral injection was again in the upper one third of the forehead vertically above the lateral limbus of the cornea, 1.5 cm lateral to the medial injection site.  -Temporalis muscle injection, 4 sites, bilaterally. The first injection was 3 cm above the tragus of the ear, second injection site was 1.5 cm to 3 cm up from the first injection site in line with the tragus of the ear. The third injection site was 1.5-3 cm forward between the first 2 injection sites. The fourth injection site was 1.5 cm posterior to the second injection site. 5th site laterally in the temporalis  muscleat the level of the outer canthus.  -Occipitalis muscle injection, 3 sites, bilaterally. The first injection was done one half way between the occipital protuberance and the tip of the mastoid process behind the ear. The second injection site was done lateral and superior to the first, 1 fingerbreadth from the first injection. The third injection site was 1 fingerbreadth superiorly and medially from the first injection site.  -  Cervical paraspinal muscle injection, 2 sites, bilaterally. The first injection site was 1 cm from the midline of the cervical spine, 3 cm inferior to the lower border of the occipital protuberance. The second injection site was 1.5 cm superiorly and laterally to the first injection site.  -Trapezius muscle injection was performed at 3 sites, bilaterally. The first injection site was in the upper trapezius muscle halfway between the inflection point of the neck, and the acromion. The second injection site was one half way between the acromion and the first injection site. The third injection was done between the first injection  site and the inflection point of the neck.   Will return for repeat injection in 3 months.   A total of 200 units of Botox was prepared, 155 units of Botox was injected as documented above, any Botox not injected was wasted. The patient tolerated the procedure well, there were no complications of the above procedure.

## 2022-10-10 ENCOUNTER — Ambulatory Visit: Payer: Medicare HMO | Admitting: Family Medicine

## 2022-11-27 ENCOUNTER — Other Ambulatory Visit (HOSPITAL_COMMUNITY): Payer: Self-pay

## 2022-12-10 NOTE — Progress Notes (Deleted)
12/11/2022 ALL: Tracy Mcconnell returns for Botox.   07/11/2022 ALL: Tracy Mcconnell returns for Botox. She continues to do very well. Tracy Mcconnell works well for abortive therapy.   04/18/2022 ALL: Tracy Mcconnell returns for Botox. She reports doing well. She may have about 10 headache days a month with 2-3 being severe. She continues follow up with me and Dr Epimenio Foot for MS.   01/24/2022 ALL: Tracy Mcconnell returns for Botox. She is doing well. Neck pain has improved. She is scheduled for MRI 02/09/2022. Migraines unchanged.   10/25/2021 ALL: Tracy Mcconnell returns for Botox. She is doing well. She has about 10 headache days a month with 2-3 migraines. Tracy Mcconnell works well.   08/02/2021 ALL: Tracy Mcconnell returns for Botox. She continues to do well. Last seen by Dr Epimenio Foot 05/2021. MS stable on Tecfidera.   05/09/2021 ALL: She returns for Botox. She feels migraines are well managed. She may have 7-8 milder headaches per month but onlu 1-2 migraines. Tracy Mcconnell helps with abortive therapy. No new MS symptoms. She continues Tecfidera. She missed her follow up with Dr Epimenio Foot 04/19/21.   02/14/2021 ALL: She continues to do well on Botox. Tracy Mcconnell helps with migraine abortion. She feels migraines are better She has about 10 headache days with 3 severe headaches monthly. She continues tecfidera. She has follow up with [facility] in January. She denies new or worsening symptoms.   11/03/2020: She continues to do well with Botox therapy. Migraines are well managed. Nurtec seems to work best for abortive therapy but not covered. She is using Ubrelvy that helps some. She was started on Tecfidera 09/2020 following concerns of 1 new focus on brain MRI. She is tolerating well and has follow up with Dr Epimenio Foot 04/2021.   08/02/2020 ALL: She continues to note benefit of Botox in migraine management. She continues Ubrelvy for abortive therapy. She continues follow up with Dr Epimenio Foot. Reports bilateral face, upper and lower extremity numbness on two separate occasions. No changes in MRI last  performed 05/2020. She has follow up with Dr Epimenio Foot in 12/2020.   04/21/2020 ALL: She returns today for fourth Botox procedure. She tolerated last procedure well Tracy Mcconnell). She does feel Botox is helping migraines. She does continue to have about 10 migraine days per month. She has failed sumatriptan and rizatriptan. She is taking gabapentin and indomethacin for back pain. Nurtec works well but not covered by [employer]. She has not tried [place]. 1 Nurtec sample and 2 Ubrelvy 50mg  samples given in office today.   She did have optic neuritis in March. MRI concerning for foci. LP performed and CSF normal. She is being followed by Dr Epimenio Foot every 6 months with repeat imaging planned. She reports vision loss of left eye resolved. She has had some blurred/double vision bilaterally and is followed by Dr Dione Booze.    Consent Form Botulism Toxin Injection For Chronic Migraine    Reviewed orally with patient, additionally signature is on file:  Botulism toxin has been approved by the Federal drug administration for treatment of chronic migraine. Botulism toxin does not cure chronic migraine and it may not be effective in some patients.  The administration of botulism toxin is accomplished by injecting a small amount of toxin into the muscles of the neck and head. Dosage must be titrated for each individual. Any benefits resulting from botulism toxin tend to wear off after 3 months with a repeat injection required if benefit is to be maintained. Injections are usually done every 3-4 months with maximum effect peak achieved by about  2 or 3 weeks. Botulism toxin is expensive and you should be sure of what costs you will incur resulting from the injection.  The side effects of botulism toxin use for chronic migraine may include:   -Transient, and usually mild, facial weakness with facial injections  -Transient, and usually mild, head or neck weakness with head/neck injections  -Reduction or loss of forehead  facial animation due to forehead muscle weakness  -Eyelid drooping  -Dry eye  -Pain at the site of injection or bruising at the site of injection  -Double vision  -Potential unknown long term risks   Contraindications: You should not have Botox if you are pregnant, nursing, allergic to albumin, have an infection, skin condition, or muscle weakness at the site of the injection, or have myasthenia gravis, Lambert-Eaton syndrome, or ALS.  It is also possible that as with any injection, there may be an allergic reaction or no effect from the medication. Reduced effectiveness after repeated injections is sometimes seen and rarely infection at the injection site may occur. All care will be taken to prevent these side effects. If therapy is given over a long time, atrophy and wasting in the muscle injected may occur. Occasionally the patient's become refractory to treatment because they develop antibodies to the toxin. In this event, therapy needs to be modified.  I have read the above information and consent to the administration of botulism toxin.    BOTOX PROCEDURE NOTE FOR MIGRAINE HEADACHE  Contraindications and precautions discussed with patient(above). Aseptic procedure was observed and patient tolerated procedure. Procedure performed by Shawnie Dapper, FNP-C.   The condition has existed for more than 6 months, and pt does not have a diagnosis of ALS, Myasthenia Gravis or Lambert-Eaton Syndrome.  Risks and benefits of injections discussed and pt agrees to proceed with the procedure.  Written consent obtained  These injections are medically necessary. Pt  receives good benefits from these injections. These injections do not cause sedations or hallucinations which the oral therapies may cause.   Description of procedure:  The patient was placed in a sitting position. The standard protocol was used for Botox as follows, with 5 units of Botox injected at each site:  -Procerus muscle, midline  injection  -Corrugator muscle, bilateral injection  -Frontalis muscle, bilateral injection, with 2 sites each side, medial injection was performed in the upper one third of the frontalis muscle, in the region vertical from the medial inferior edge of the superior orbital rim. The lateral injection was again in the upper one third of the forehead vertically above the lateral limbus of the cornea, 1.5 cm lateral to the medial injection site.  -Temporalis muscle injection, 4 sites, bilaterally. The first injection was 3 cm above the tragus of the ear, second injection site was 1.5 cm to 3 cm up from the first injection site in line with the tragus of the ear. The third injection site was 1.5-3 cm forward between the first 2 injection sites. The fourth injection site was 1.5 cm posterior to the second injection site. 5th site laterally in the temporalis  muscleat the level of the outer canthus.  -Occipitalis muscle injection, 3 sites, bilaterally. The first injection was done one half way between the occipital protuberance and the tip of the mastoid process behind the ear. The second injection site was done lateral and superior to the first, 1 fingerbreadth from the first injection. The third injection site was 1 fingerbreadth superiorly and medially from the first injection site.  -  Cervical paraspinal muscle injection, 2 sites, bilaterally. The first injection site was 1 cm from the midline of the cervical spine, 3 cm inferior to the lower border of the occipital protuberance. The second injection site was 1.5 cm superiorly and laterally to the first injection site.  -Trapezius muscle injection was performed at 3 sites, bilaterally. The first injection site was in the upper trapezius muscle halfway between the inflection point of the neck, and the acromion. The second injection site was one half way between the acromion and the first injection site. The third injection was done between the first injection  site and the inflection point of the neck.   Will return for repeat injection in 3 months.   A total of 200 units of Botox was prepared, 155 units of Botox was injected as documented above, any Botox not injected was wasted. The patient tolerated the procedure well, there were no complications of the above procedure.

## 2022-12-11 ENCOUNTER — Encounter: Payer: Self-pay | Admitting: Family Medicine

## 2022-12-11 ENCOUNTER — Ambulatory Visit: Payer: Medicare HMO | Admitting: Family Medicine

## 2022-12-11 DIAGNOSIS — G43709 Chronic migraine without aura, not intractable, without status migrainosus: Secondary | ICD-10-CM

## 2023-01-23 ENCOUNTER — Encounter: Payer: Self-pay | Admitting: Neurology

## 2023-01-23 ENCOUNTER — Ambulatory Visit (INDEPENDENT_AMBULATORY_CARE_PROVIDER_SITE_OTHER): Payer: Medicare HMO | Admitting: Neurology

## 2023-01-23 VITALS — BP 106/69 | HR 81 | Ht 60.0 in | Wt 116.4 lb

## 2023-01-23 DIAGNOSIS — Z79899 Other long term (current) drug therapy: Secondary | ICD-10-CM

## 2023-01-23 DIAGNOSIS — R202 Paresthesia of skin: Secondary | ICD-10-CM

## 2023-01-23 DIAGNOSIS — G43709 Chronic migraine without aura, not intractable, without status migrainosus: Secondary | ICD-10-CM

## 2023-01-23 DIAGNOSIS — R260 Ataxic gait: Secondary | ICD-10-CM

## 2023-01-23 DIAGNOSIS — Z8669 Personal history of other diseases of the nervous system and sense organs: Secondary | ICD-10-CM

## 2023-01-23 DIAGNOSIS — G35 Multiple sclerosis: Secondary | ICD-10-CM

## 2023-01-23 DIAGNOSIS — R200 Anesthesia of skin: Secondary | ICD-10-CM

## 2023-01-23 MED ORDER — DIMETHYL FUMARATE 240 MG PO CPDR: 240.0000 mg | DELAYED_RELEASE_CAPSULE | Freq: Two times a day (BID) | ORAL | 3 refills | Status: DC

## 2023-01-23 NOTE — Progress Notes (Signed)
[facility]  PATIENT: Tracy Mcconnell DOB: 1996/03/11  REFERRING DOCTOR OR PCP: PCP is Janeece Agee SOURCE: Patient, notes from neurology and hospital, imaging and lab reports, MRI images personally reviewed  _________________________________   HISTORICAL  CHIEF COMPLAINT:  Chief Complaint  Patient presents with   Follow-up    Pt in room 10 alone. Here for MS follow up. Pt reports doing well, no new symptoms. No concerns. Needs refill on dimethy fumarate.     HISTORY OF PRESENT ILLNESS:  Tracy Mcconnell is a 27 y.o. woman with left optic neuritis and abnormal brain MRi  Update 06/29/2021 She had a concussion a couple weeks ago, hiting her head on the pullout freezer drawer.    There was no LOc but she had a headache x a few days afterwards.    She is better now.     She is on DMF and tolerates it well.    She is walking well and denies falls.   She usually uses the rails on stairs.  She has left > right leg weakness.    .  She denies numbness or dysesthesias in her limbs now and no longer has a Lhermitte signThe 02/09/2022  MRI of the brain and cervical spine showed no acute or new findings.  .     She had left ON and occasioanlly she notes left visual  blurring but no longer has color desaturation       Migraines are doing fairly well on Botox.  Last Botox was 6 months ago (has had insurance issues.  She has a different policy now.   She is getting 1-2 /week now.   She is on Ubrelvy when one occurs with good benefit.   Bright lights or sunlight triggers a migraine.   Two headaches the past week had N/V.      Multiple sclerosis/ History: In February 2021, she had the onset of left eye blurry vision and pain, worse with eye movements.    She saw urgent care and then ophthalmology   She was felt to have optic neuritis and presented to the [facility] ED.   MRI of the brain and orbits showed enhancement of the left optic nerve,   There were a few  T2 hyperintense foci in the hemispheres, including a periventricular focus in the right frontal lobe and one juxtacortical focus in the right parietal lobe.  None of the brain foci enhanced.  In retrospect, she has numbness and weakness in her legs that lasted a week, left > right in November 2019.   She had an LP 07/01/2019 and CSF showed no OCB and normal IgG Index   Imaging: MRI brain and cervical spine 02/09/2022 were unchanged  MRI of the brain 10/15/2020 was unchanged  MRI cervical spine 10/15/2020 was normal  MRI of the brain 05/23/2020 showed scattered T2/FLAIR hyperintense foci including 2 in the periventricular white matter and 1 in the juxtacortical white matter.  None of the foci enhance.  Compared to the MRI of the brain from 12/24/2019 and 06/10/2019, there are no new lesions.  MRI of the cervical spine 05/23/2020 showed a normal spinal cord.  No degenerative changes.  MRI of the orbits 05/23/2020 showed no enhancing lesions.  There was slight hyperintensity of the left optic nerve consistent with the previous optic neuritis.  Her maternal aunt and two second cousins have MS.    REVIEW OF SYSTEMS: Constitutional: No fevers, chills, sweats, or change in appetite Eyes: No  visual changes, double vision, eye pain Ear, nose and throat: No hearing loss, ear pain, nasal congestion, sore throat Cardiovascular: No chest pain, palpitations Respiratory:  No shortness of breath at rest or with exertion.   No wheezes GastrointestinaI: No nausea, vomiting, diarrhea, abdominal pain, fecal incontinence Genitourinary:  No dysuria, urinary retention or frequency.  No nocturia. Musculoskeletal:  No neck pain, back pain Integumentary: No rash, pruritus, skin lesions Neurological: as above Psychiatric: No depression at this time.  No anxiety Endocrine: No palpitations, diaphoresis, change in appetite, change in weigh or increased thirst Hematologic/Lymphatic:  No anemia, purpura,  petechiae. Allergic/Immunologic: No itchy/runny eyes, nasal congestion, recent allergic reactions, rashes  ALLERGIES: Allergies  Allergen Reactions   Sumatriptan Shortness Of Breath and Other (See Comments)    Entire body "felt heavy"   Other Itching, Swelling and Other (See Comments)    Pesticides (family history)- Exposure face and mouth itch and the tongue/lips swell    HOME MEDICATIONS:  Current Outpatient Medications:    buPROPion (WELLBUTRIN XL) 300 MG 24 hr tablet, Take 1 tablet (300 mg total) by mouth daily., Disp: 30 tablet, Rfl: 2   busPIRone (BUSPAR) 30 MG tablet, Take 1 tablet (30 mg total) by mouth in the morning and at bedtime., Disp: 60 tablet, Rfl: 2   Dimethyl Fumarate (TECFIDERA) 240 MG CPDR, Take 1 capsule (240 mg total) by mouth in the morning and at bedtime., Disp: 180 capsule, Rfl: 3   FLUoxetine (PROZAC) 40 MG capsule, Take 2 capsules (80 mg total) by mouth in the morning., Disp: 60 capsule, Rfl: 2   gabapentin (NEURONTIN) 600 MG tablet, Take 1 tablet (600 mg total) by mouth 4 (four) times daily., Disp: 120 tablet, Rfl: 5   ondansetron (ZOFRAN ODT) 4 MG disintegrating tablet, Take 1 tablet (4 mg total) by mouth every 8 (eight) hours as needed for nausea or vomiting., Disp: 20 tablet, Rfl: 0   ondansetron (ZOFRAN) 4 MG tablet, Take 1 tablet (4 mg total) by mouth every 8 (eight) hours as needed for nausea or vomiting., Disp: 20 tablet, Rfl: 0   pantoprazole (PROTONIX) 40 MG tablet, Take 1 tablet (40 mg total) by mouth daily., Disp: 30 tablet, Rfl: 3   traZODone (DESYREL) 50 MG tablet, Take 1 tablet (50 mg total) by mouth at bedtime as needed for sleep (1-2 tabs as needed)., Disp: 60 tablet, Rfl: 2   Ubrogepant (UBRELVY) 100 MG TABS, Take 1 tablet (100 mg total) by mouth daily as needed. Take one tablet at onset of headache, may repeat 1 tablet in 2 hours, no more than 2 tablets in 24 hours, Disp: 24 tablet, Rfl: 3   sucralfate (CARAFATE) 1 GM/10ML suspension, Take 10 mLs  (1 g total) by mouth 4 (four) times daily -  with meals and at bedtime. (Patient not taking: Reported on 01/23/2023), Disp: 420 mL, Rfl: 0  PAST MEDICAL HISTORY: Past Medical History:  Diagnosis Date   Anemia    Anxiety    Depression    GERD (gastroesophageal reflux disease)    Hypovitaminosis D 04/07/2019   Left eye complaint 06/08/2019   Migraines    MS (multiple sclerosis) (HCC)     PAST SURGICAL HISTORY: Past Surgical History:  Procedure Laterality Date   CHOLECYSTECTOMY N/A 10/19/2019   Procedure: LAPAROSCOPIC CHOLECYSTECTOMY;  Surgeon: Berna Bue, MD;  Location: [facility];  Service: General;  Laterality: N/A;   LAPAROSCOPIC APPENDECTOMY N/A 01/17/2020   Procedure: APPENDECTOMY LAPAROSCOPIC;  Surgeon: Harriette Bouillon, MD;  Location: [facility];  Service: General;  Laterality: N/A;    FAMILY HISTORY: Family History  Problem Relation Age of Onset   Migraines Mother    Thyroid disease Mother    Cancer Maternal Grandmother    Hypertension Maternal Grandfather    Rheum arthritis Paternal Grandmother    Diabetes Paternal Grandmother    Stroke Paternal Grandmother    Diabetes Paternal Grandfather     SOCIAL HISTORY:  Social History   Socioeconomic History   Marital status: Single    Spouse name: Not on file   Number of children: 0   Years of education: college student   Highest education level: Not on file  Occupational History   Not on file  Tobacco Use   Smoking status: Never   Smokeless tobacco: Never  Vaping Use   Vaping status: Never Used  Substance and Sexual Activity   Alcohol use: Not Currently   Drug use: No   Sexual activity: Yes    Birth control/protection: None  Other Topics Concern   Not on file  Social History Narrative   Lives at home alone   Right handed   Does not drink caffeine, makes her nauseated   Social Determinants of Health   Financial Resource Strain: Not on file  Food Insecurity: Not on file  Transportation Needs: No  Transportation Needs (02/04/2017)   PRAPARE - Administrator, Civil Service (Medical): No    Lack of Transportation (Non-Medical): No  Physical Activity: Not on file  Stress: Not on file  Social Connections: Not on file  Intimate Partner Violence: Not on file     PHYSICAL EXAM  Vitals:   01/23/23 1519  BP: 106/69  Pulse: 81  Weight: 116 lb 6.4 oz (52.8 kg)  Height: 5' (1.524 m)    Body mass index is 22.73 kg/m.  No results found.  General: The patient is well-developed and well-nourished and in no acute distress.  Mild cervical araspinal tenderness  HEENT:  Head is [place]/AT.  Sclera are anicteric.     Skin/extremities: Extremities are without rash or  edema.     Neurologic Exam  Mental status: The patient is alert and oriented x 3 at the time of the examination. The patient has apparent normal recent and remote memory, with an apparently normal attention span and concentration ability.   Speech is normal.  Cranial nerves: Extraocular movements are full. Pupils show 1+ left APD.   Reduced color vision OS.  Facial strength was normal and symmetric.[name] No obvious hearing deficits are noted.  Motor:  Muscle bulk is normal.   Tone is normal. Strength is  5 / 5 in all 4 extremities.   Sensory: She reports reduced/altered sensation in the right arm relative to the left, mildly asymmetric in legs.    Coordination: Cerebellar testing reveals good finger-nose-finger and heel-to-shin bilaterally.  Gait and station: Station is normal.   Her gait is normal.  Tandem gait is normal.  Romberg is negative.  Reflexes: Deep tendon reflexes are symmetric and normal bilaterally (2 in the arms and 3 in the legs).   P    DIAGNOSTIC DATA (LABS, IMAGING, TESTING) - I reviewed patient records, labs, notes, testing and imaging myself where available.  Lab Results  Component Value Date   WBC 7.8 07/24/2022   HGB 14.7 07/24/2022   HCT 43.7 07/24/2022   MCV 90 07/24/2022   PLT 349  07/24/2022      Component Value Date/Time   NA 139  08/23/2021 1059   NA 140 12/28/2019 1655   K 3.5 08/23/2021 1059   CL 101 08/23/2021 1059   CO2 29 08/23/2021 1059   GLUCOSE 86 08/23/2021 1059   BUN 8 08/23/2021 1059   BUN 9 12/28/2019 1655   CREATININE 0.62 08/23/2021 1059   CALCIUM 9.9 08/23/2021 1059   PROT 7.0 07/24/2022 1541   ALBUMIN 4.5 07/24/2022 1541   ALBUMIN 4.2 07/01/2019 0919   AST 16 07/24/2022 1541   ALT 9 07/24/2022 1541   ALKPHOS 84 07/24/2022 1541   BILITOT 0.4 07/24/2022 1541   GFRNONAA >60 02/24/2021 2319   GFRAA 138 12/28/2019 1655   Lab Results  Component Value Date   CHOL 192 09/09/2020   HDL 50 09/09/2020   LDLCALC 128 (H) 09/09/2020   TRIG 68 09/09/2020   CHOLHDL 3.8 09/09/2020   Lab Results  Component Value Date   HGBA1C 5.2 09/11/2020   Lab Results  Component Value Date   VITAMINB12 553 06/17/2019   Lab Results  Component Value Date   TSH 0.88 08/23/2021       ASSESSMENT AND PLAN  Multiple sclerosis (HCC) - Plan: MR BRAIN W WO CONTRAST, CBC with Differential/Platelet  High risk medication use - Plan: CBC with Differential/Platelet  Chronic migraine without aura without status migrainosus, not intractable  History of optic neuritis  Numbness and tingling of right arm  Ataxic gait  1.   Continue Tecfidera.  Lymphocytes were 1.6  07/24/2022   Will recheck.   Check MRI to determine if subclinical breakthrough activity.   If this is occurring then consider a different DMT 2.  Stay active exercise as tolerated. 3.   Continue Botox for chronic migraine   She will need to get re-authorized.    4.   Return in 6 months or sooner if there are new or worsening neurologic symptoms.    Tonya Carlile A. Epimenio Foot, MD, [facility] 01/23/2023, 3:49 PM Certified in Neurology, Clinical Neurophysiology, Sleep Medicine and Neuroimaging  [facility] [address] [place], [place] [address] [phone]

## 2023-01-23 NOTE — Progress Notes (Unsigned)
01/24/2023 ALL: Tracy Mcconnell returns for Botox. Last procedure 07/2022. She reports doing well. She may have 7 headache days a month. [name] works well for abortive therapy. She was seen yesterday by Dr Epimenio Foot. MS stable.   07/11/2022 ALL: Tracy Mcconnell returns for Botox. She continues to do very well. [name] works well for abortive therapy.   04/18/2022 ALL: Tracy Mcconnell returns for Botox. She reports doing well. She may have about 10 headache days a month with 2-3 being severe. She continues follow up with me and Dr Epimenio Foot for MS.   01/24/2022 ALL: Tracy Mcconnell returns for Botox. She is doing well. Neck pain has improved. She is scheduled for MRI 02/09/2022. Migraines unchanged.   10/25/2021 ALL: Tracy Mcconnell returns for Botox. She is doing well. She has about 10 headache days a month with 2-3 migraines. [name] works well.   08/02/2021 ALL: Tracy Mcconnell returns for Botox. She continues to do well. Last seen by Dr Epimenio Foot 05/2021. MS stable on Tecfidera.   05/09/2021 ALL: She returns for Botox. She feels migraines are well managed. She may have 7-8 milder headaches per month but onlu 1-2 migraines. [name] helps with abortive therapy. No new MS symptoms. She continues Tecfidera. She missed her follow up with Dr Epimenio Foot 04/19/21.   02/14/2021 ALL: She continues to do well on Botox. [name] helps with migraine abortion. She feels migraines are better She has about 10 headache days with 3 severe headaches monthly. She continues tecfidera. She has follow up with [facility] in January. She denies new or worsening symptoms.   11/03/2020: She continues to do well with Botox therapy. Migraines are well managed. Nurtec seems to work best for abortive therapy but not covered. She is using Ubrelvy that helps some. She was started on Tecfidera 09/2020 following concerns of 1 new focus on brain MRI. She is tolerating well and has follow up with Dr Epimenio Foot 04/2021.   08/02/2020 ALL: She continues to note benefit of Botox in migraine management. She continues  Ubrelvy for abortive therapy. She continues follow up with Dr Epimenio Foot. Reports bilateral face, upper and lower extremity numbness on two separate occasions. No changes in MRI last performed 05/2020. She has follow up with Dr Epimenio Foot in 12/2020.   04/21/2020 ALL: She returns today for fourth Botox procedure. She tolerated last procedure well [name]). She does feel Botox is helping migraines. She does continue to have about 10 migraine days per month. She has failed sumatriptan and rizatriptan. She is taking gabapentin and indomethacin for back pain. Nurtec works well but not covered by [employer]. She has not tried [place]. 1 Nurtec sample and 2 Ubrelvy 50mg  samples given in office today.   She did have optic neuritis in March. MRI concerning for foci. LP performed and CSF normal. She is being followed by Dr Epimenio Foot every 6 months with repeat imaging planned. She reports vision loss of left eye resolved. She has had some blurred/double vision bilaterally and is followed by Dr Dione Booze.    Consent Form Botulism Toxin Injection For Chronic Migraine    Reviewed orally with patient, additionally signature is on file:  Botulism toxin has been approved by the Federal drug administration for treatment of chronic migraine. Botulism toxin does not cure chronic migraine and it may not be effective in some patients.  The administration of botulism toxin is accomplished by injecting a small amount of toxin into the muscles of the neck and head. Dosage must be titrated for each individual. Any benefits resulting from botulism toxin tend to  wear off after 3 months with a repeat injection required if benefit is to be maintained. Injections are usually done every 3-4 months with maximum effect peak achieved by about 2 or 3 weeks. Botulism toxin is expensive and you should be sure of what costs you will incur resulting from the injection.  The side effects of botulism toxin use for chronic migraine may  include:   -Transient, and usually mild, facial weakness with facial injections  -Transient, and usually mild, head or neck weakness with head/neck injections  -Reduction or loss of forehead facial animation due to forehead muscle weakness  -Eyelid drooping  -Dry eye  -Pain at the site of injection or bruising at the site of injection  -Double vision  -Potential unknown long term risks   Contraindications: You should not have Botox if you are pregnant, nursing, allergic to albumin, have an infection, skin condition, or muscle weakness at the site of the injection, or have myasthenia gravis, Lambert-Eaton syndrome, or ALS.  It is also possible that as with any injection, there may be an allergic reaction or no effect from the medication. Reduced effectiveness after repeated injections is sometimes seen and rarely infection at the injection site may occur. All care will be taken to prevent these side effects. If therapy is given over a long time, atrophy and wasting in the muscle injected may occur. Occasionally the patient's become refractory to treatment because they develop antibodies to the toxin. In this event, therapy needs to be modified.  I have read the above information and consent to the administration of botulism toxin.    BOTOX PROCEDURE NOTE FOR MIGRAINE HEADACHE  Contraindications and precautions discussed with patient(above). Aseptic procedure was observed and patient tolerated procedure. Procedure performed by Shawnie Dapper, FNP-C.   The condition has existed for more than 6 months, and pt does not have a diagnosis of ALS, Myasthenia Gravis or Lambert-Eaton Syndrome.  Risks and benefits of injections discussed and pt agrees to proceed with the procedure.  Written consent obtained  These injections are medically necessary. Pt  receives good benefits from these injections. These injections do not cause sedations or hallucinations which the oral therapies may cause.   Description  of procedure:  The patient was placed in a sitting position. The standard protocol was used for Botox as follows, with 5 units of Botox injected at each site:  -Procerus muscle, midline injection  -Corrugator muscle, bilateral injection  -Frontalis muscle, bilateral injection, with 2 sites each side, medial injection was performed in the upper one third of the frontalis muscle, in the region vertical from the medial inferior edge of the superior orbital rim. The lateral injection was again in the upper one third of the forehead vertically above the lateral limbus of the cornea, 1.5 cm lateral to the medial injection site.  -Temporalis muscle injection, 4 sites, bilaterally. The first injection was 3 cm above the tragus of the ear, second injection site was 1.5 cm to 3 cm up from the first injection site in line with the tragus of the ear. The third injection site was 1.5-3 cm forward between the first 2 injection sites. The fourth injection site was 1.5 cm posterior to the second injection site. 5th site laterally in the temporalis  muscleat the level of the outer canthus.  -Occipitalis muscle injection, 3 sites, bilaterally. The first injection was done one half way between the occipital protuberance and the tip of the mastoid process behind the ear. The second injection site  was done lateral and superior to the first, 1 fingerbreadth from the first injection. The third injection site was 1 fingerbreadth superiorly and medially from the first injection site.  -Cervical paraspinal muscle injection, 2 sites, bilaterally. The first injection site was 1 cm from the midline of the cervical spine, 3 cm inferior to the lower border of the occipital protuberance. The second injection site was 1.5 cm superiorly and laterally to the first injection site.  -Trapezius muscle injection was performed at 3 sites, bilaterally. The first injection site was in the upper trapezius muscle halfway between the inflection  point of the neck, and the acromion. The second injection site was one half way between the acromion and the first injection site. The third injection was done between the first injection site and the inflection point of the neck.   Will return for repeat injection in 3 months.   A total of 200 units of Botox was prepared, 155 units of Botox was injected as documented above, any Botox not injected was wasted. The patient tolerated the procedure well, there were no complications of the above procedure.

## 2023-01-24 ENCOUNTER — Ambulatory Visit (INDEPENDENT_AMBULATORY_CARE_PROVIDER_SITE_OTHER): Payer: Medicare HMO | Admitting: Family Medicine

## 2023-01-24 DIAGNOSIS — G43709 Chronic migraine without aura, not intractable, without status migrainosus: Secondary | ICD-10-CM

## 2023-01-24 LAB — CBC WITH DIFFERENTIAL/PLATELET
Basophils Absolute: 0 10*3/uL (ref 0.0–0.2)
Basos: 0 %
EOS (ABSOLUTE): 0.1 10*3/uL (ref 0.0–0.4)
Eos: 1 %
Hematocrit: 40.4 % (ref 34.0–46.6)
Hemoglobin: 13.3 g/dL (ref 11.1–15.9)
Immature Grans (Abs): 0 10*3/uL (ref 0.0–0.1)
Immature Granulocytes: 0 %
Lymphocytes Absolute: 2 10*3/uL (ref 0.7–3.1)
Lymphs: 27 %
MCH: 30.9 pg (ref 26.6–33.0)
MCHC: 32.9 g/dL (ref 31.5–35.7)
MCV: 94 fL (ref 79–97)
Monocytes Absolute: 0.6 10*3/uL (ref 0.1–0.9)
Monocytes: 8 %
Neutrophils Absolute: 4.9 10*3/uL (ref 1.4–7.0)
Neutrophils: 64 %
Platelets: 349 10*3/uL (ref 150–450)
RBC: 4.3 x10E6/uL (ref 3.77–5.28)
RDW: 11.8 % (ref 11.7–15.4)
WBC: 7.6 10*3/uL (ref 3.4–10.8)

## 2023-01-24 MED ORDER — ONABOTULINUMTOXINA 200 UNITS IJ SOLR
155.0000 [IU] | Freq: Once | INTRAMUSCULAR | Status: AC
  Administered 2023-01-24: 155 [IU] via INTRAMUSCULAR

## 2023-01-24 NOTE — Progress Notes (Signed)
Botox- 200 units x 1 vial Lot: [id number] Expiration: 05/2025 NDC: 4782-9562-13  Bacteriostatic 0.9% Sodium Chloride- * mL  Lot: [id number] Expiration: 07/02/2023 NDC: 4696-2952-84  Dx: X32.440 B/B Witnessed by Ted Mcalpine, RMA

## 2023-01-29 ENCOUNTER — Telehealth: Payer: Self-pay | Admitting: Neurology

## 2023-01-29 NOTE — Telephone Encounter (Signed)
[employer] Harvey: [phone] exp. 01/29/23-03/30/23, [place] medicaid NPR sent to [facility] [phone]

## 2023-02-11 ENCOUNTER — Telehealth: Payer: Self-pay

## 2023-03-09 ENCOUNTER — Telehealth: Payer: Medicare HMO | Admitting: Family Medicine

## 2023-03-09 DIAGNOSIS — M5489 Other dorsalgia: Secondary | ICD-10-CM | POA: Diagnosis not present

## 2023-03-09 MED ORDER — NAPROXEN 500 MG PO TABS: 500.0000 mg | ORAL_TABLET | Freq: Two times a day (BID) | ORAL | 0 refills | Status: AC

## 2023-03-09 MED ORDER — CYCLOBENZAPRINE HCL 10 MG PO TABS: 10.0000 mg | ORAL_TABLET | Freq: Three times a day (TID) | ORAL | 0 refills | Status: AC | PRN

## 2023-03-09 NOTE — Progress Notes (Signed)
E-Visit for Back Pain   We are sorry that you are not feeling well.  Here is how we plan to help!  Based on what you have shared with me it looks like you mostly have acute back pain.  Acute back pain is defined as musculoskeletal pain that can resolve in 1-3 weeks with conservative treatment.  I have prescribed Naprosyn 500 mg take one by mouth twice a day non-steroid anti-inflammatory (NSAID) as well as Flexeril 10 mg every eight hours as needed which is a muscle relaxer  Some patients experience stomach irritation or in increased heartburn with anti-inflammatory drugs.  Please keep in mind that muscle relaxer's can cause fatigue and should not be taken while at work or driving.  Back pain is very common.  The pain often gets better over time.  The cause of back pain is usually not dangerous.  Most people can learn to manage their back pain on their own.  Home Care Stay active.  Start with short walks on flat ground if you can.  Try to walk farther each day. Do not sit, drive or stand in one place for more than 30 minutes.  Do not stay in bed. Do not avoid exercise or work.  Activity can help your back heal faster. Be careful when you bend or lift an object.  Bend at your knees, keep the object close to you, and do not twist. Sleep on a firm mattress.  Lie on your side, and bend your knees.  If you lie on your back, put a pillow under your knees. Only take medicines as told by your doctor. Put ice on the injured area. Put ice in a plastic bag Place a towel between your skin and the bag Leave the ice on for 15-20 minutes, 3-4 times a day for the first 2-3 days. 210 After that, you can switch between ice and heat packs. Ask your doctor about back exercises or massage. Avoid feeling anxious or stressed.  Find good ways to deal with stress, such as exercise.  Get Help Right Way If: Your pain does not go away with rest or medicine. Your pain does not go away in 1 week. You have new  problems. You do not feel well. The pain spreads into your legs. You cannot control when you poop (bowel movement) or pee (urinate) You feel sick to your stomach (nauseous) or throw up (vomit) You have belly (abdominal) pain. You feel like you may pass out (faint). If you develop a fever.  Make Sure you: Understand these instructions. Will watch your condition Will get help right away if you are not doing well or get worse.  Your e-visit answers were reviewed by a board certified advanced clinical practitioner to complete your personal care plan.  Depending on the condition, your plan could have included both over the counter or prescription medications.  If there is a problem please reply  once you have received a response from your provider.  Your safety is important to [place].  If you have drug allergies check your prescription carefully.    You can use MyChart to ask questions about today's visit, request a non-urgent call back, or ask for a work or school excuse for 24 hours related to this e-Visit. If it has been greater than 24 hours you will need to follow up with your provider, or enter a new e-Visit to address those concerns.  You will get an e-mail in the next two days asking about  your experience.  I hope that your e-visit has been valuable and will speed your recovery. Thank you for using e-visits.    have provided 5 minutes of non face to face time during this encounter for chart review and documentation.

## 2023-03-14 ENCOUNTER — Ambulatory Visit
Admission: RE | Admit: 2023-03-14 | Discharge: 2023-03-14 | Disposition: A | Discharge status: Home / Self Care | Payer: Medicare HMO | Source: Ambulatory Visit | Attending: Neurology | Admitting: Neurology

## 2023-03-14 DIAGNOSIS — G35 Multiple sclerosis: Secondary | ICD-10-CM | POA: Diagnosis not present

## 2023-03-14 MED ORDER — GADOPICLENOL 0.5 MMOL/ML IV SOLN
5.0000 mL | Freq: Once | INTRAVENOUS | Status: AC | PRN
  Administered 2023-03-14: 5 mL via INTRAVENOUS

## 2023-05-01 ENCOUNTER — Ambulatory Visit (INDEPENDENT_AMBULATORY_CARE_PROVIDER_SITE_OTHER): Payer: Medicare HMO | Admitting: Family Medicine

## 2023-05-01 ENCOUNTER — Encounter: Payer: Self-pay | Admitting: Family Medicine

## 2023-05-01 ENCOUNTER — Ambulatory Visit: Payer: Medicare HMO | Admitting: Family Medicine

## 2023-05-01 VITALS — BP 110/70 | HR 91 | Ht 60.0 in | Wt 102.5 lb

## 2023-05-01 DIAGNOSIS — G43709 Chronic migraine without aura, not intractable, without status migrainosus: Secondary | ICD-10-CM

## 2023-05-01 MED ORDER — ONABOTULINUMTOXINA 200 UNITS IJ SOLR
155.0000 [IU] | Freq: Once | INTRAMUSCULAR | Status: AC
  Administered 2023-05-01: 155 [IU] via INTRAMUSCULAR

## 2023-05-01 NOTE — Progress Notes (Signed)
05/01/2023 ALL: Tracy Mcconnell returns for Botox. She continues to do well. Migraines are well managed. [name] works well for abortive therapy.   01/24/2023 ALL: Tracy Mcconnell returns for Botox. Last procedure 07/2022. She reports doing well. She may have 7 headache days a month. [name] works well for abortive therapy. She was seen yesterday by Dr Epimenio Foot. MS stable.   07/11/2022 ALL: Tracy Mcconnell returns for Botox. She continues to do very well. [name] works well for abortive therapy.   04/18/2022 ALL: Tracy Mcconnell returns for Botox. She reports doing well. She may have about 10 headache days a month with 2-3 being severe. She continues follow up with me and Dr Epimenio Foot for MS.   01/24/2022 ALL: Tracy Mcconnell returns for Botox. She is doing well. Neck pain has improved. She is scheduled for MRI 02/09/2022. Migraines unchanged.   10/25/2021 ALL: Tracy Mcconnell returns for Botox. She is doing well. She has about 10 headache days a month with 2-3 migraines. [name] works well.   08/02/2021 ALL: Tracy Mcconnell returns for Botox. She continues to do well. Last seen by Dr Epimenio Foot 05/2021. MS stable on Tecfidera.   05/09/2021 ALL: She returns for Botox. She feels migraines are well managed. She may have 7-8 milder headaches per month but onlu 1-2 migraines. [name] helps with abortive therapy. No new MS symptoms. She continues Tecfidera. She missed her follow up with Dr Epimenio Foot 04/19/21.   02/14/2021 ALL: She continues to do well on Botox. [name] helps with migraine abortion. She feels migraines are better She has about 10 headache days with 3 severe headaches monthly. She continues tecfidera. She has follow up with [facility] in January. She denies new or worsening symptoms.   11/03/2020: She continues to do well with Botox therapy. Migraines are well managed. Nurtec seems to work best for abortive therapy but not covered. She is using Ubrelvy that helps some. She was started on Tecfidera 09/2020 following concerns of 1 new focus on brain MRI. She is tolerating  well and has follow up with Dr Epimenio Foot 04/2021.   08/02/2020 ALL: She continues to note benefit of Botox in migraine management. She continues Ubrelvy for abortive therapy. She continues follow up with Dr Epimenio Foot. Reports bilateral face, upper and lower extremity numbness on two separate occasions. No changes in MRI last performed 05/2020. She has follow up with Dr Epimenio Foot in 12/2020.   04/21/2020 ALL: She returns today for fourth Botox procedure. She tolerated last procedure well [name]). She does feel Botox is helping migraines. She does continue to have about 10 migraine days per month. She has failed sumatriptan and rizatriptan. She is taking gabapentin and indomethacin for back pain. Nurtec works well but not covered by [employer]. She has not tried [place]. 1 Nurtec sample and 2 Ubrelvy 50mg  samples given in office today.   She did have optic neuritis in March. MRI concerning for foci. LP performed and CSF normal. She is being followed by Dr Epimenio Foot every 6 months with repeat imaging planned. She reports vision loss of left eye resolved. She has had some blurred/double vision bilaterally and is followed by Dr Dione Booze.    Consent Form Botulism Toxin Injection For Chronic Migraine    Reviewed orally with patient, additionally signature is on file:  Botulism toxin has been approved by the Federal drug administration for treatment of chronic migraine. Botulism toxin does not cure chronic migraine and it may not be effective in some patients.  The administration of botulism toxin is accomplished by injecting a small amount of toxin  into the muscles of the neck and head. Dosage must be titrated for each individual. Any benefits resulting from botulism toxin tend to wear off after 3 months with a repeat injection required if benefit is to be maintained. Injections are usually done every 3-4 months with maximum effect peak achieved by about 2 or 3 weeks. Botulism toxin is expensive and you should be sure of  what costs you will incur resulting from the injection.  The side effects of botulism toxin use for chronic migraine may include:   -Transient, and usually mild, facial weakness with facial injections  -Transient, and usually mild, head or neck weakness with head/neck injections  -Reduction or loss of forehead facial animation due to forehead muscle weakness  -Eyelid drooping  -Dry eye  -Pain at the site of injection or bruising at the site of injection  -Double vision  -Potential unknown long term risks   Contraindications: You should not have Botox if you are pregnant, nursing, allergic to albumin, have an infection, skin condition, or muscle weakness at the site of the injection, or have myasthenia gravis, Lambert-Eaton syndrome, or ALS.  It is also possible that as with any injection, there may be an allergic reaction or no effect from the medication. Reduced effectiveness after repeated injections is sometimes seen and rarely infection at the injection site may occur. All care will be taken to prevent these side effects. If therapy is given over a long time, atrophy and wasting in the muscle injected may occur. Occasionally the patient's become refractory to treatment because they develop antibodies to the toxin. In this event, therapy needs to be modified.  I have read the above information and consent to the administration of botulism toxin.    BOTOX PROCEDURE NOTE FOR MIGRAINE HEADACHE  Contraindications and precautions discussed with patient(above). Aseptic procedure was observed and patient tolerated procedure. Procedure performed by Shawnie Dapper, FNP-C.   The condition has existed for more than 6 months, and pt does not have a diagnosis of ALS, Myasthenia Gravis or Lambert-Eaton Syndrome.  Risks and benefits of injections discussed and pt agrees to proceed with the procedure.  Written consent obtained  These injections are medically necessary. Pt  receives good benefits from these  injections. These injections do not cause sedations or hallucinations which the oral therapies may cause.   Description of procedure:  The patient was placed in a sitting position. The standard protocol was used for Botox as follows, with 5 units of Botox injected at each site:  -Procerus muscle, midline injection  -Corrugator muscle, bilateral injection  -Frontalis muscle, bilateral injection, with 2 sites each side, medial injection was performed in the upper one third of the frontalis muscle, in the region vertical from the medial inferior edge of the superior orbital rim. The lateral injection was again in the upper one third of the forehead vertically above the lateral limbus of the cornea, 1.5 cm lateral to the medial injection site.  -Temporalis muscle injection, 4 sites, bilaterally. The first injection was 3 cm above the tragus of the ear, second injection site was 1.5 cm to 3 cm up from the first injection site in line with the tragus of the ear. The third injection site was 1.5-3 cm forward between the first 2 injection sites. The fourth injection site was 1.5 cm posterior to the second injection site. 5th site laterally in the temporalis  muscleat the level of the outer canthus.  -Occipitalis muscle injection, 3 sites, bilaterally. The first injection  was done one half way between the occipital protuberance and the tip of the mastoid process behind the ear. The second injection site was done lateral and superior to the first, 1 fingerbreadth from the first injection. The third injection site was 1 fingerbreadth superiorly and medially from the first injection site.  -Cervical paraspinal muscle injection, 2 sites, bilaterally. The first injection site was 1 cm from the midline of the cervical spine, 3 cm inferior to the lower border of the occipital protuberance. The second injection site was 1.5 cm superiorly and laterally to the first injection site.  -Trapezius muscle injection was  performed at 3 sites, bilaterally. The first injection site was in the upper trapezius muscle halfway between the inflection point of the neck, and the acromion. The second injection site was one half way between the acromion and the first injection site. The third injection was done between the first injection site and the inflection point of the neck.   Will return for repeat injection in 3 months.   A total of 200 units of Botox was prepared, 155 units of Botox was injected as documented above, any Botox not injected was wasted. The patient tolerated the procedure well, there were no complications of the above procedure.

## 2023-05-01 NOTE — Progress Notes (Deleted)
05/01/2023 ALL: Princetta returns for Botox.   01/24/2023 ALL: Sherlynn returns for Botox. Last procedure 07/2022. She reports doing well. She may have 7 headache days a month. [name] works well for abortive therapy. She was seen yesterday by Dr Epimenio Foot. MS stable.   07/11/2022 ALL: Jaclynn returns for Botox. She continues to do very well. [name] works well for abortive therapy.   04/18/2022 ALL: Shamica returns for Botox. She reports doing well. She may have about 10 headache days a month with 2-3 being severe. She continues follow up with me and Dr Epimenio Foot for MS.   01/24/2022 ALL: Tezra returns for Botox. She is doing well. Neck pain has improved. She is scheduled for MRI 02/09/2022. Migraines unchanged.   10/25/2021 ALL: Caysie returns for Botox. She is doing well. She has about 10 headache days a month with 2-3 migraines. [name] works well.   08/02/2021 ALL: [name] returns for Botox. She continues to do well. Last seen by Dr Epimenio Foot 05/2021. MS stable on Tecfidera.   05/09/2021 ALL: She returns for Botox. She feels migraines are well managed. She may have 7-8 milder headaches per month but onlu 1-2 migraines. [name] helps with abortive therapy. No new MS symptoms. She continues Tecfidera. She missed her follow up with Dr Epimenio Foot 04/19/21.   02/14/2021 ALL: She continues to do well on Botox. [name] helps with migraine abortion. She feels migraines are better She has about 10 headache days with 3 severe headaches monthly. She continues tecfidera. She has follow up with [facility] in January. She denies new or worsening symptoms.   11/03/2020: She continues to do well with Botox therapy. Migraines are well managed. Nurtec seems to work best for abortive therapy but not covered. She is using Ubrelvy that helps some. She was started on Tecfidera 09/2020 following concerns of 1 new focus on brain MRI. She is tolerating well and has follow up with Dr Epimenio Foot 04/2021.   08/02/2020 ALL: She continues to note benefit of  Botox in migraine management. She continues Ubrelvy for abortive therapy. She continues follow up with Dr Epimenio Foot. Reports bilateral face, upper and lower extremity numbness on two separate occasions. No changes in MRI last performed 05/2020. She has follow up with Dr Epimenio Foot in 12/2020.   04/21/2020 ALL: She returns today for fourth Botox procedure. She tolerated last procedure well [name]). She does feel Botox is helping migraines. She does continue to have about 10 migraine days per month. She has failed sumatriptan and rizatriptan. She is taking gabapentin and indomethacin for back pain. Nurtec works well but not covered by [employer]. She has not tried [place]. 1 Nurtec sample and 2 Ubrelvy 50mg  samples given in office today.   She did have optic neuritis in March. MRI concerning for foci. LP performed and CSF normal. She is being followed by Dr Epimenio Foot every 6 months with repeat imaging planned. She reports vision loss of left eye resolved. She has had some blurred/double vision bilaterally and is followed by Dr Dione Booze.    Consent Form Botulism Toxin Injection For Chronic Migraine    Reviewed orally with patient, additionally signature is on file:  Botulism toxin has been approved by the Federal drug administration for treatment of chronic migraine. Botulism toxin does not cure chronic migraine and it may not be effective in some patients.  The administration of botulism toxin is accomplished by injecting a small amount of toxin into the muscles of the neck and head. Dosage must be titrated for each individual.  Any benefits resulting from botulism toxin tend to wear off after 3 months with a repeat injection required if benefit is to be maintained. Injections are usually done every 3-4 months with maximum effect peak achieved by about 2 or 3 weeks. Botulism toxin is expensive and you should be sure of what costs you will incur resulting from the injection.  The side effects of botulism toxin  use for chronic migraine may include:   -Transient, and usually mild, facial weakness with facial injections  -Transient, and usually mild, head or neck weakness with head/neck injections  -Reduction or loss of forehead facial animation due to forehead muscle weakness  -Eyelid drooping  -Dry eye  -Pain at the site of injection or bruising at the site of injection  -Double vision  -Potential unknown long term risks   Contraindications: You should not have Botox if you are pregnant, nursing, allergic to albumin, have an infection, skin condition, or muscle weakness at the site of the injection, or have myasthenia gravis, Lambert-Eaton syndrome, or ALS.  It is also possible that as with any injection, there may be an allergic reaction or no effect from the medication. Reduced effectiveness after repeated injections is sometimes seen and rarely infection at the injection site may occur. All care will be taken to prevent these side effects. If therapy is given over a long time, atrophy and wasting in the muscle injected may occur. Occasionally the patient's become refractory to treatment because they develop antibodies to the toxin. In this event, therapy needs to be modified.  I have read the above information and consent to the administration of botulism toxin.    BOTOX PROCEDURE NOTE FOR MIGRAINE HEADACHE  Contraindications and precautions discussed with patient(above). Aseptic procedure was observed and patient tolerated procedure. Procedure performed by Shawnie Dapper, FNP-C.   The condition has existed for more than 6 months, and pt does not have a diagnosis of ALS, Myasthenia Gravis or Lambert-Eaton Syndrome.  Risks and benefits of injections discussed and pt agrees to proceed with the procedure.  Written consent obtained  These injections are medically necessary. Pt  receives good benefits from these injections. These injections do not cause sedations or hallucinations which the oral  therapies may cause.   Description of procedure:  The patient was placed in a sitting position. The standard protocol was used for Botox as follows, with 5 units of Botox injected at each site:  -Procerus muscle, midline injection  -Corrugator muscle, bilateral injection  -Frontalis muscle, bilateral injection, with 2 sites each side, medial injection was performed in the upper one third of the frontalis muscle, in the region vertical from the medial inferior edge of the superior orbital rim. The lateral injection was again in the upper one third of the forehead vertically above the lateral limbus of the cornea, 1.5 cm lateral to the medial injection site.  -Temporalis muscle injection, 4 sites, bilaterally. The first injection was 3 cm above the tragus of the ear, second injection site was 1.5 cm to 3 cm up from the first injection site in line with the tragus of the ear. The third injection site was 1.5-3 cm forward between the first 2 injection sites. The fourth injection site was 1.5 cm posterior to the second injection site. 5th site laterally in the temporalis  muscleat the level of the outer canthus.  -Occipitalis muscle injection, 3 sites, bilaterally. The first injection was done one half way between the occipital protuberance and the tip of the mastoid  process behind the ear. The second injection site was done lateral and superior to the first, 1 fingerbreadth from the first injection. The third injection site was 1 fingerbreadth superiorly and medially from the first injection site.  -Cervical paraspinal muscle injection, 2 sites, bilaterally. The first injection site was 1 cm from the midline of the cervical spine, 3 cm inferior to the lower border of the occipital protuberance. The second injection site was 1.5 cm superiorly and laterally to the first injection site.  -Trapezius muscle injection was performed at 3 sites, bilaterally. The first injection site was in the upper trapezius  muscle halfway between the inflection point of the neck, and the acromion. The second injection site was one half way between the acromion and the first injection site. The third injection was done between the first injection site and the inflection point of the neck.   Will return for repeat injection in 3 months.   A total of 200 units of Botox was prepared, 155 units of Botox was injected as documented above, any Botox not injected was wasted. The patient tolerated the procedure well, there were no complications of the above procedure.

## 2023-05-01 NOTE — Progress Notes (Signed)
Botox- 200 units x 1 vial Lot:[id number] Expiration: 07/2025 NDC: 2841-3244-01   Bacteriostatic 0.9% Sodium Chloride- 4 mL  Lot: [id number] Expiration: 02/01/2024 NDC: 6644-0347-42   Dx: V95.638 B/B Witnessed by Berna Spare, CMA

## 2023-05-15 ENCOUNTER — Telehealth: Payer: Self-pay | Admitting: Pharmacist

## 2023-05-15 NOTE — Telephone Encounter (Signed)
Pharmacy Patient Advocate Encounter   Received notification from CoverMyMeds that prior authorization for Ubrelvy 100MG  tablet is required/requested.   Insurance verification completed.   The patient is insured through [employer] .   Per test claim: PA required; PA submitted to above mentioned insurance via CoverMyMeds Key/confirmation #/EOC [id number] Status is pending

## 2023-05-22 ENCOUNTER — Other Ambulatory Visit (HOSPITAL_COMMUNITY): Payer: Self-pay

## 2023-07-31 ENCOUNTER — Encounter: Payer: Self-pay | Admitting: Family Medicine

## 2023-07-31 ENCOUNTER — Ambulatory Visit (INDEPENDENT_AMBULATORY_CARE_PROVIDER_SITE_OTHER): Payer: Medicare HMO | Admitting: Family Medicine

## 2023-07-31 DIAGNOSIS — G43709 Chronic migraine without aura, not intractable, without status migrainosus: Secondary | ICD-10-CM | POA: Diagnosis not present

## 2023-07-31 MED ORDER — ONABOTULINUMTOXINA 200 UNITS IJ SOLR
155.0000 [IU] | Freq: Once | INTRAMUSCULAR | Status: AC
  Administered 2023-07-31: 155 [IU] via INTRAMUSCULAR

## 2023-07-31 NOTE — Progress Notes (Signed)
 Botox -200U x 1vial Lot: [id number] Expiration: 12/2025 NDC: 3086-5784-69  Bacteriostatic 0.9% Sodium Chloride - 4mL total Lot: [id number] Expiration:02/01/2024 NDC: 4132-4401-02  Witnessed by: Amye Baller up by Brett Camera  Buy/bill

## 2023-07-31 NOTE — Progress Notes (Signed)
 07/31/2023 ALL: Tracy Mcconnell returns for Botox . She is doing well. She continues Ubrelvy  for abortive therapy.   05/01/2023 ALL: Tracy Mcconnell returns for Botox . She continues to do well. Migraines are well managed. Ubrelvy  works well for abortive therapy.   01/24/2023 ALL: Tracy Mcconnell returns for Botox . Last procedure 07/2022. She reports doing well. She may have 7 headache days a month. Ubrelvy  works well for abortive therapy. She was seen yesterday by Dr Godwin Lat. MS stable.   07/11/2022 ALL: Tracy Mcconnell returns for Botox . She continues to do very well. Ubrelvy  works well for abortive therapy.   04/18/2022 ALL: Tracy Mcconnell returns for Botox . She reports doing well. She may have about 10 headache days a month with 2-3 being severe. She continues follow up with me and Dr Godwin Lat for MS.   01/24/2022 ALL: Tracy Mcconnell returns for Botox . She is doing well. Neck pain has improved. She is scheduled for MRI 02/09/2022. Migraines unchanged.   10/25/2021 ALL: Tracy Mcconnell returns for Botox . She is doing well. She has about 10 headache days a month with 2-3 migraines. Ubrelvy  works well.   08/02/2021 ALL: Tracy Mcconnell returns for Botox . She continues to do well. Last seen by Dr Godwin Lat 05/2021. MS stable on Tecfidera .   05/09/2021 ALL: She returns for Botox . She feels migraines are well managed. She may have 7-8 milder headaches per month but onlu 1-2 migraines. Ubrelvy  helps with abortive therapy. No new MS symptoms. She continues Tecfidera . She missed her follow up with Dr Godwin Lat 04/19/21.   02/14/2021 ALL: She continues to do well on Botox . Ubrelvy  helps with migraine abortion. She feels migraines are better She has about 10 headache days with 3 severe headaches monthly. She continues tecfidera . She has follow up with [facility] in January. She denies new or worsening symptoms.   11/03/2020: She continues to do well with Botox  therapy. Migraines are well managed. Nurtec seems to work best for abortive therapy but not covered. She is using Ubrelvy  that helps  some. She was started on Tecfidera  09/2020 following concerns of 1 new focus on brain MRI. She is tolerating well and has follow up with Dr Godwin Lat 04/2021.   08/02/2020 ALL: She continues to note benefit of Botox  in migraine management. She continues Ubrelvy  for abortive therapy. She continues follow up with Dr Godwin Lat. Reports bilateral face, upper and lower extremity numbness on two separate occasions. No changes in MRI last performed 05/2020. She has follow up with Dr Godwin Lat in 12/2020.   04/21/2020 ALL: She returns today for fourth Botox  procedure. She tolerated last procedure well [name]). She does feel Botox  is helping migraines. She does continue to have about 10 migraine days per month. She has failed sumatriptan and rizatriptan . She is taking gabapentin  and indomethacin  for back pain. Nurtec works well but not covered by [employer]. She has not tried Ubrelvy . 1 Nurtec sample and 2 Ubrelvy  50mg  samples given in office today.   She did have optic neuritis in March. MRI concerning for foci. LP performed and CSF normal. She is being followed by Dr Godwin Lat every 6 months with repeat imaging planned. She reports vision loss of left eye resolved. She has had some blurred/double vision bilaterally and is followed by Dr [name].    Consent Form Botulism Toxin Injection For Chronic Migraine    Reviewed orally with patient, additionally signature is on file:  Botulism toxin has been approved by the Federal drug administration for treatment of chronic migraine. Botulism toxin does not cure chronic migraine and it may not be effective  in some patients.  The administration of botulism toxin is accomplished by injecting a small amount of toxin into the muscles of the neck and head. Dosage must be titrated for each individual. Any benefits resulting from botulism toxin tend to wear off after 3 months with a repeat injection required if benefit is to be maintained. Injections are usually done every 3-4 months  with maximum effect peak achieved by about 2 or 3 weeks. Botulism toxin is expensive and you should be sure of what costs you will incur resulting from the injection.  The side effects of botulism toxin use for chronic migraine may include:   -Transient, and usually mild, facial weakness with facial injections  -Transient, and usually mild, head or neck weakness with head/neck injections  -Reduction or loss of forehead facial animation due to forehead muscle weakness  -Eyelid drooping  -Dry eye  -Pain at the site of injection or bruising at the site of injection  -Double vision  -Potential unknown long term risks   Contraindications: You should not have Botox  if you are pregnant, nursing, allergic to albumin, have an infection, skin condition, or muscle weakness at the site of the injection, or have myasthenia gravis, Lambert-Eaton syndrome, or ALS.  It is also possible that as with any injection, there may be an allergic reaction or no effect from the medication. Reduced effectiveness after repeated injections is sometimes seen and rarely infection at the injection site may occur. All care will be taken to prevent these side effects. If therapy is given over a long time, atrophy and wasting in the muscle injected may occur. Occasionally the patient's become refractory to treatment because they develop antibodies to the toxin. In this event, therapy needs to be modified.  I have read the above information and consent to the administration of botulism toxin.    BOTOX  PROCEDURE NOTE FOR MIGRAINE HEADACHE  Contraindications and precautions discussed with patient(above). Aseptic procedure was observed and patient tolerated procedure. Procedure performed by Terrilyn Fick, FNP-C.   The condition has existed for more than 6 months, and pt does not have a diagnosis of ALS, Myasthenia Gravis or Lambert-Eaton Syndrome.  Risks and benefits of injections discussed and pt agrees to proceed with the  procedure.  Written consent obtained  These injections are medically necessary. Pt  receives good benefits from these injections. These injections do not cause sedations or hallucinations which the oral therapies may cause.   Description of procedure:  The patient was placed in a sitting position. The standard protocol was used for Botox  as follows, with 5 units of Botox  injected at each site:  -Procerus muscle, midline injection  -Corrugator muscle, bilateral injection  -Frontalis muscle, bilateral injection, with 2 sites each side, medial injection was performed in the upper one third of the frontalis muscle, in the region vertical from the medial inferior edge of the superior orbital rim. The lateral injection was again in the upper one third of the forehead vertically above the lateral limbus of the cornea, 1.5 cm lateral to the medial injection site.  -Temporalis muscle injection, 4 sites, bilaterally. The first injection was 3 cm above the tragus of the ear, second injection site was 1.5 cm to 3 cm up from the first injection site in line with the tragus of the ear. The third injection site was 1.5-3 cm forward between the first 2 injection sites. The fourth injection site was 1.5 cm posterior to the second injection site. 5th site laterally in the temporalis  muscleat the level of the outer canthus.  -Occipitalis muscle injection, 3 sites, bilaterally. The first injection was done one half way between the occipital protuberance and the tip of the mastoid process behind the ear. The second injection site was done lateral and superior to the first, 1 fingerbreadth from the first injection. The third injection site was 1 fingerbreadth superiorly and medially from the first injection site.  -Cervical paraspinal muscle injection, 2 sites, bilaterally. The first injection site was 1 cm from the midline of the cervical spine, 3 cm inferior to the lower border of the occipital protuberance. The  second injection site was 1.5 cm superiorly and laterally to the first injection site.  -Trapezius muscle injection was performed at 3 sites, bilaterally. The first injection site was in the upper trapezius muscle halfway between the inflection point of the neck, and the acromion. The second injection site was one half way between the acromion and the first injection site. The third injection was done between the first injection site and the inflection point of the neck.   Will return for repeat injection in 3 months.   A total of 200 units of Botox  was prepared, 155 units of Botox  was injected as documented above, any Botox  not injected was wasted. The patient tolerated the procedure well, there were no complications of the above procedure.

## 2023-10-23 NOTE — Progress Notes (Unsigned)
 07/31/2023 ALL: Tracy Mcconnell returns for Botox . She is doing well. She continues Ubrelvy  for abortive therapy.   05/01/2023 ALL: Tracy Mcconnell returns for Botox . She continues to do well. Migraines are well managed. Ubrelvy  works well for abortive therapy.   01/24/2023 ALL: Tracy Mcconnell returns for Botox . Last procedure 07/2022. She reports doing well. She may have 7 headache days a month. Ubrelvy  works well for abortive therapy. She was seen yesterday by Dr Godwin Lat. MS stable.   07/11/2022 ALL: Tracy Mcconnell returns for Botox . She continues to do very well. Ubrelvy  works well for abortive therapy.   04/18/2022 ALL: Tracy Mcconnell returns for Botox . She reports doing well. She may have about 10 headache days a month with 2-3 being severe. She continues follow up with me and Dr Godwin Lat for MS.   01/24/2022 ALL: Tracy Mcconnell returns for Botox . She is doing well. Neck pain has improved. She is scheduled for MRI 02/09/2022. Migraines unchanged.   10/25/2021 ALL: Tracy Mcconnell returns for Botox . She is doing well. She has about 10 headache days a month with 2-3 migraines. Ubrelvy  works well.   08/02/2021 ALL: Tracy Mcconnell returns for Botox . She continues to do well. Last seen by Dr Godwin Lat 05/2021. MS stable on Tecfidera .   05/09/2021 ALL: She returns for Botox . She feels migraines are well managed. She may have 7-8 milder headaches per month but onlu 1-2 migraines. Ubrelvy  helps with abortive therapy. No new MS symptoms. She continues Tecfidera . She missed her follow up with Dr Godwin Lat 04/19/21.   02/14/2021 ALL: She continues to do well on Botox . Ubrelvy  helps with migraine abortion. She feels migraines are better She has about 10 headache days with 3 severe headaches monthly. She continues tecfidera . She has follow up with [facility] in January. She denies new or worsening symptoms.   11/03/2020: She continues to do well with Botox  therapy. Migraines are well managed. Nurtec seems to work best for abortive therapy but not covered. She is using Ubrelvy  that helps  some. She was started on Tecfidera  09/2020 following concerns of 1 new focus on brain MRI. She is tolerating well and has follow up with Dr Godwin Lat 04/2021.   08/02/2020 ALL: She continues to note benefit of Botox  in migraine management. She continues Ubrelvy  for abortive therapy. She continues follow up with Dr Godwin Lat. Reports bilateral face, upper and lower extremity numbness on two separate occasions. No changes in MRI last performed 05/2020. She has follow up with Dr Godwin Lat in 12/2020.   04/21/2020 ALL: She returns today for fourth Botox  procedure. She tolerated last procedure well [name]). She does feel Botox  is helping migraines. She does continue to have about 10 migraine days per month. She has failed sumatriptan and rizatriptan . She is taking gabapentin  and indomethacin  for back pain. Nurtec works well but not covered by [employer]. She has not tried Ubrelvy . 1 Nurtec sample and 2 Ubrelvy  50mg  samples given in office today.   She did have optic neuritis in March. MRI concerning for foci. LP performed and CSF normal. She is being followed by Dr Godwin Lat every 6 months with repeat imaging planned. She reports vision loss of left eye resolved. She has had some blurred/double vision bilaterally and is followed by Dr Tracy Mcconnell.    Consent Form Botulism Toxin Injection For Chronic Migraine    Reviewed orally with patient, additionally signature is on file:  Botulism toxin has been approved by the Federal drug administration for treatment of chronic migraine. Botulism toxin does not cure chronic migraine and it may not be effective  in some patients.  The administration of botulism toxin is accomplished by injecting a small amount of toxin into the muscles of the neck and head. Dosage must be titrated for each individual. Any benefits resulting from botulism toxin tend to wear off after 3 months with a repeat injection required if benefit is to be maintained. Injections are usually done every 3-4 months  with maximum effect peak achieved by about 2 or 3 weeks. Botulism toxin is expensive and you should be sure of what costs you will incur resulting from the injection.  The side effects of botulism toxin use for chronic migraine may include:   -Transient, and usually mild, facial weakness with facial injections  -Transient, and usually mild, head or neck weakness with head/neck injections  -Reduction or loss of forehead facial animation due to forehead muscle weakness  -Eyelid drooping  -Dry eye  -Pain at the site of injection or bruising at the site of injection  -Double vision  -Potential unknown long term risks   Contraindications: You should not have Botox  if you are pregnant, nursing, allergic to albumin, have an infection, skin condition, or muscle weakness at the site of the injection, or have myasthenia gravis, Lambert-Eaton syndrome, or ALS.  It is also possible that as with any injection, there may be an allergic reaction or no effect from the medication. Reduced effectiveness after repeated injections is sometimes seen and rarely infection at the injection site may occur. All care will be taken to prevent these side effects. If therapy is given over a long time, atrophy and wasting in the muscle injected may occur. Occasionally the patient's become refractory to treatment because they develop antibodies to the toxin. In this event, therapy needs to be modified.  I have read the above information and consent to the administration of botulism toxin.    BOTOX  PROCEDURE NOTE FOR MIGRAINE HEADACHE  Contraindications and precautions discussed with patient(above). Aseptic procedure was observed and patient tolerated procedure. Procedure performed by Terrilyn Fick, FNP-C.   The condition has existed for more than 6 months, and pt does not have a diagnosis of ALS, Myasthenia Gravis or Lambert-Eaton Syndrome.  Risks and benefits of injections discussed and pt agrees to proceed with the  procedure.  Written consent obtained  These injections are medically necessary. Pt  receives good benefits from these injections. These injections do not cause sedations or hallucinations which the oral therapies may cause.   Description of procedure:  The patient was placed in a sitting position. The standard protocol was used for Botox  as follows, with 5 units of Botox  injected at each site:  -Procerus muscle, midline injection  -Corrugator muscle, bilateral injection  -Frontalis muscle, bilateral injection, with 2 sites each side, medial injection was performed in the upper one third of the frontalis muscle, in the region vertical from the medial inferior edge of the superior orbital rim. The lateral injection was again in the upper one third of the forehead vertically above the lateral limbus of the cornea, 1.5 cm lateral to the medial injection site.  -Temporalis muscle injection, 4 sites, bilaterally. The first injection was 3 cm above the tragus of the ear, second injection site was 1.5 cm to 3 cm up from the first injection site in line with the tragus of the ear. The third injection site was 1.5-3 cm forward between the first 2 injection sites. The fourth injection site was 1.5 cm posterior to the second injection site. 5th site laterally in the temporalis  muscleat the level of the outer canthus.  -Occipitalis muscle injection, 3 sites, bilaterally. The first injection was done one half way between the occipital protuberance and the tip of the mastoid process behind the ear. The second injection site was done lateral and superior to the first, 1 fingerbreadth from the first injection. The third injection site was 1 fingerbreadth superiorly and medially from the first injection site.  -Cervical paraspinal muscle injection, 2 sites, bilaterally. The first injection site was 1 cm from the midline of the cervical spine, 3 cm inferior to the lower border of the occipital protuberance. The  second injection site was 1.5 cm superiorly and laterally to the first injection site.  -Trapezius muscle injection was performed at 3 sites, bilaterally. The first injection site was in the upper trapezius muscle halfway between the inflection point of the neck, and the acromion. The second injection site was one half way between the acromion and the first injection site. The third injection was done between the first injection site and the inflection point of the neck.   Will return for repeat injection in 3 months.   A total of 200 units of Botox  was prepared, 155 units of Botox  was injected as documented above, any Botox  not injected was wasted. The patient tolerated the procedure well, there were no complications of the above procedure.

## 2023-10-24 ENCOUNTER — Encounter: Payer: Self-pay | Admitting: Family Medicine

## 2023-10-24 ENCOUNTER — Ambulatory Visit (INDEPENDENT_AMBULATORY_CARE_PROVIDER_SITE_OTHER): Admitting: Family Medicine

## 2023-10-24 VITALS — BP 101/68 | HR 79

## 2023-10-24 DIAGNOSIS — G43709 Chronic migraine without aura, not intractable, without status migrainosus: Secondary | ICD-10-CM | POA: Diagnosis not present

## 2023-10-24 MED ORDER — UBRELVY 100 MG PO TABS: 100.0000 mg | ORAL_TABLET | Freq: Every day | ORAL | 3 refills | Status: AC | PRN

## 2023-10-24 MED ORDER — ONABOTULINUMTOXINA 200 UNITS IJ SOLR
155.0000 [IU] | Freq: Once | INTRAMUSCULAR | Status: AC
  Administered 2023-10-24: 155 [IU] via INTRAMUSCULAR

## 2023-10-24 NOTE — Progress Notes (Signed)
 Botox - 200 units x 1 vial Lot: [id number] Expiration: 12/2025 NDC: 9976-6078-97  Bacteriostatic 0.9% Sodium Chloride - 30 mL  Lot: [id number] Expiration: OCT-31-2026 NDC: 9590-8033-97  Dx:G43.709  B/B Witnessed by: April Jones,RN

## 2023-11-01 ENCOUNTER — Other Ambulatory Visit: Payer: Self-pay | Admitting: Family Medicine

## 2023-11-01 DIAGNOSIS — G43709 Chronic migraine without aura, not intractable, without status migrainosus: Secondary | ICD-10-CM

## 2023-11-04 ENCOUNTER — Telehealth: Payer: Self-pay | Admitting: *Deleted

## 2023-11-04 NOTE — Telephone Encounter (Signed)
Request sent to PA team.

## 2023-11-05 ENCOUNTER — Other Ambulatory Visit (HOSPITAL_COMMUNITY): Payer: Self-pay

## 2023-11-05 ENCOUNTER — Telehealth: Payer: Self-pay

## 2023-11-05 NOTE — Telephone Encounter (Signed)
 Prior Authorization for Ubrelvy  100MG  tablets has been APPROVED from 11/05/2023 to 04/01/2024.

## 2023-11-05 NOTE — Telephone Encounter (Signed)
 Sent mychart message

## 2023-11-05 NOTE — Telephone Encounter (Signed)
 Pharmacy Patient Advocate Encounter  Received notification from [employer] that Prior Authorization for Ubrelvy  100MG  tablets has been APPROVED from 11/05/2023 to 04/01/2024. Ran test claim, Copay is $0. This test claim was processed through [facility]- copay amounts may vary at other pharmacies due to pharmacy/plan contracts, or as the patient moves through the different stages of their insurance plan.   PA #/Case ID/Reference #: PA Case ID #: [id number]

## 2023-11-05 NOTE — Telephone Encounter (Signed)
 Pharmacy Patient Advocate Encounter   Received notification from Physician's Office that prior authorization for Ubrelvy  100MG  tablets is required/requested.   Insurance verification completed.   The patient is insured through [employer] .   Per test claim: PA required; PA submitted to above mentioned insurance via CoverMyMeds Key/confirmation #/EOC [id number] Status is pending

## 2023-12-18 ENCOUNTER — Other Ambulatory Visit: Payer: Self-pay | Admitting: Neurology

## 2023-12-18 DIAGNOSIS — G35 Multiple sclerosis: Secondary | ICD-10-CM

## 2023-12-18 NOTE — Telephone Encounter (Signed)
 Last seen on 10/24/23 Follow up scheduled on 01/23/24

## 2023-12-31 ENCOUNTER — Telehealth: Payer: Self-pay | Admitting: Family Medicine

## 2023-12-31 DIAGNOSIS — G43709 Chronic migraine without aura, not intractable, without status migrainosus: Secondary | ICD-10-CM

## 2023-12-31 NOTE — Telephone Encounter (Signed)
 Submitted auth request via CMM to switch pt to SP, status is pending. Key: [id number]

## 2024-01-06 ENCOUNTER — Other Ambulatory Visit (HOSPITAL_COMMUNITY): Payer: Self-pay

## 2024-01-06 ENCOUNTER — Other Ambulatory Visit: Payer: Self-pay

## 2024-01-06 MED ORDER — ONABOTULINUMTOXINA 200 UNITS IJ SOLR
INTRAMUSCULAR | 2 refills | Status: AC
  Filled 2024-01-06: qty 1, fill #0
  Filled 2024-01-23: qty 1, 84d supply, fill #0
  Filled 2024-04-09 – 2024-04-24 (×3): qty 1, 84d supply, fill #1

## 2024-01-06 NOTE — Addendum Note (Signed)
 Addended by: JOSHUA MAURILIO CROME on: 01/06/2024 07:55 AM   Modules accepted: Orders

## 2024-01-06 NOTE — Telephone Encounter (Signed)
 Received approval, please send rx to [facility].  Auth#: [id number] (01/06/24-04/01/24)

## 2024-01-07 ENCOUNTER — Other Ambulatory Visit (HOSPITAL_COMMUNITY): Payer: Self-pay

## 2024-01-14 ENCOUNTER — Other Ambulatory Visit (HOSPITAL_COMMUNITY): Payer: Self-pay

## 2024-01-20 ENCOUNTER — Ambulatory Visit: Admitting: Family Medicine

## 2024-01-22 ENCOUNTER — Other Ambulatory Visit (HOSPITAL_COMMUNITY): Payer: Self-pay

## 2024-01-22 NOTE — Progress Notes (Deleted)
 01/27/2024: Tracy Mcconnell returns for Botox .   10/24/2023: Tracy Mcconnell returns for Botox . She is doing well. She continues Ubrelvy  for abortive therapy.   07/31/2023 ALL: Tracy Mcconnell returns for Botox . She is doing well. She continues Ubrelvy  for abortive therapy.   05/01/2023 ALL: Tracy Mcconnell returns for Botox . She continues to do well. Migraines are well managed. Ubrelvy  works well for abortive therapy.   01/24/2023 ALL: Tracy Mcconnell returns for Botox . Last procedure 07/2022. She reports doing well. She may have 7 headache days a month. Ubrelvy  works well for abortive therapy. She was seen yesterday by Dr Vear. MS stable.   07/11/2022 ALL: Tracy Mcconnell returns for Botox . She continues to do very well. Ubrelvy  works well for abortive therapy.   04/18/2022 ALL: Tracy Mcconnell returns for Botox . She reports doing well. She may have about 10 headache days a month with 2-3 being severe. She continues follow up with me and Dr Vear for MS.   01/24/2022 ALL: Tracy Mcconnell returns for Botox . She is doing well. Neck pain has improved. She is scheduled for MRI 02/09/2022. Migraines unchanged.   10/25/2021 ALL: Tracy Mcconnell returns for Botox . She is doing well. She has about 10 headache days a month with 2-3 migraines. Ubrelvy  works well.   08/02/2021 ALL: Tracy Mcconnell returns for Botox . She continues to do well. Last seen by Dr Vear 05/2021. MS stable on Tecfidera .   05/09/2021 ALL: She returns for Botox . She feels migraines are well managed. She may have 7-8 milder headaches per month but onlu 1-2 migraines. Ubrelvy  helps with abortive therapy. No new MS symptoms. She continues Tecfidera . She missed her follow up with Dr Vear 04/19/21.   02/14/2021 ALL: She continues to do well on Botox . Ubrelvy  helps with migraine abortion. She feels migraines are better She has about 10 headache days with 3 severe headaches monthly. She continues tecfidera . She has follow up with [facility] in January. She denies new or worsening symptoms.   11/03/2020: She continues to do  well with Botox  therapy. Migraines are well managed. Nurtec seems to work best for abortive therapy but not covered. She is using Ubrelvy  that helps some. She was started on Tecfidera  09/2020 following concerns of 1 new focus on brain MRI. She is tolerating well and has follow up with Dr Vear 04/2021.   08/02/2020 ALL: She continues to note benefit of Botox  in migraine management. She continues Ubrelvy  for abortive therapy. She continues follow up with Dr Vear. Reports bilateral face, upper and lower extremity numbness on two separate occasions. No changes in MRI last performed 05/2020. She has follow up with Dr Vear in 12/2020.   04/21/2020 ALL: She returns today for fourth Botox  procedure. She tolerated last procedure well Johnnie Russell). She does feel Botox  is helping migraines. She does continue to have about 10 migraine days per month. She has failed sumatriptan and rizatriptan . She is taking gabapentin  and indomethacin  for back pain. Nurtec works well but not covered by [employer]. She has not tried Ubrelvy . 1 Nurtec sample and 2 Ubrelvy  50mg  samples given in office today.   She did have optic neuritis in March. MRI concerning for foci. LP performed and CSF normal. She is being followed by Dr Vear every 6 months with repeat imaging planned. She reports vision loss of left eye resolved. She has had some blurred/double vision bilaterally and is followed by Dr Octavia.    Consent Form Botulism Toxin Injection For Chronic Migraine    Reviewed orally with patient, additionally signature is on file:  Botulism toxin has been  approved by the Federal drug administration for treatment of chronic migraine. Botulism toxin does not cure chronic migraine and it may not be effective in some patients.  The administration of botulism toxin is accomplished by injecting a small amount of toxin into the muscles of the neck and head. Dosage must be titrated for each individual. Any benefits resulting from botulism  toxin tend to wear off after 3 months with a repeat injection required if benefit is to be maintained. Injections are usually done every 3-4 months with maximum effect peak achieved by about 2 or 3 weeks. Botulism toxin is expensive and you should be sure of what costs you will incur resulting from the injection.  The side effects of botulism toxin use for chronic migraine may include:   -Transient, and usually mild, facial weakness with facial injections  -Transient, and usually mild, head or neck weakness with head/neck injections  -Reduction or loss of forehead facial animation due to forehead muscle weakness  -Eyelid drooping  -Dry eye  -Pain at the site of injection or bruising at the site of injection  -Double vision  -Potential unknown long term risks   Contraindications: You should not have Botox  if you are pregnant, nursing, allergic to albumin, have an infection, skin condition, or muscle weakness at the site of the injection, or have myasthenia gravis, Lambert-Eaton syndrome, or ALS.  It is also possible that as with any injection, there may be an allergic reaction or no effect from the medication. Reduced effectiveness after repeated injections is sometimes seen and rarely infection at the injection site may occur. All care will be taken to prevent these side effects. If therapy is given over a long time, atrophy and wasting in the muscle injected may occur. Occasionally the patient's become refractory to treatment because they develop antibodies to the toxin. In this event, therapy needs to be modified.  I have read the above information and consent to the administration of botulism toxin.    BOTOX  PROCEDURE NOTE FOR MIGRAINE HEADACHE  Contraindications and precautions discussed with patient(above). Aseptic procedure was observed and patient tolerated procedure. Procedure performed by Greig Forbes, FNP-C.   The condition has existed for more than 6 months, and pt does not have a  diagnosis of ALS, Myasthenia Gravis or Lambert-Eaton Syndrome.  Risks and benefits of injections discussed and pt agrees to proceed with the procedure.  Written consent obtained  These injections are medically necessary. Pt  receives good benefits from these injections. These injections do not cause sedations or hallucinations which the oral therapies may cause.   Description of procedure:  The patient was placed in a sitting position. The standard protocol was used for Botox  as follows, with 5 units of Botox  injected at each site:  -Procerus muscle, midline injection  -Corrugator muscle, bilateral injection  -Frontalis muscle, bilateral injection, with 2 sites each side, medial injection was performed in the upper one third of the frontalis muscle, in the region vertical from the medial inferior edge of the superior orbital rim. The lateral injection was again in the upper one third of the forehead vertically above the lateral limbus of the cornea, 1.5 cm lateral to the medial injection site.  -Temporalis muscle injection, 4 sites, bilaterally. The first injection was 3 cm above the tragus of the ear, second injection site was 1.5 cm to 3 cm up from the first injection site in line with the tragus of the ear. The third injection site was 1.5-3 cm forward between  the first 2 injection sites. The fourth injection site was 1.5 cm posterior to the second injection site. 5th site laterally in the temporalis  muscleat the level of the outer canthus.  -Occipitalis muscle injection, 3 sites, bilaterally. The first injection was done one half way between the occipital protuberance and the tip of the mastoid process behind the ear. The second injection site was done lateral and superior to the first, 1 fingerbreadth from the first injection. The third injection site was 1 fingerbreadth superiorly and medially from the first injection site.  -Cervical paraspinal muscle injection, 2 sites, bilaterally. The  first injection site was 1 cm from the midline of the cervical spine, 3 cm inferior to the lower border of the occipital protuberance. The second injection site was 1.5 cm superiorly and laterally to the first injection site.  -Trapezius muscle injection was performed at 3 sites, bilaterally. The first injection site was in the upper trapezius muscle halfway between the inflection point of the neck, and the acromion. The second injection site was one half way between the acromion and the first injection site. The third injection was done between the first injection site and the inflection point of the neck.   Will return for repeat injection in 3 months.   A total of 200 units of Botox  was prepared, 155 units of Botox  was injected as documented above, any Botox  not injected was wasted. The patient tolerated the procedure well, there were no complications of the above procedure.

## 2024-01-23 ENCOUNTER — Other Ambulatory Visit: Payer: Self-pay

## 2024-01-23 ENCOUNTER — Encounter: Payer: Self-pay | Admitting: Neurology

## 2024-01-23 ENCOUNTER — Other Ambulatory Visit (HOSPITAL_COMMUNITY): Payer: Self-pay

## 2024-01-23 ENCOUNTER — Ambulatory Visit (INDEPENDENT_AMBULATORY_CARE_PROVIDER_SITE_OTHER): Payer: Medicare HMO | Admitting: Neurology

## 2024-01-23 VITALS — BP 105/63 | HR 71 | Ht 61.0 in | Wt 98.5 lb

## 2024-01-23 DIAGNOSIS — G43709 Chronic migraine without aura, not intractable, without status migrainosus: Secondary | ICD-10-CM

## 2024-01-23 DIAGNOSIS — G35A Relapsing-remitting multiple sclerosis: Secondary | ICD-10-CM

## 2024-01-23 DIAGNOSIS — Z8669 Personal history of other diseases of the nervous system and sense organs: Secondary | ICD-10-CM | POA: Diagnosis not present

## 2024-01-23 DIAGNOSIS — Z79899 Other long term (current) drug therapy: Secondary | ICD-10-CM

## 2024-01-23 DIAGNOSIS — R7989 Other specified abnormal findings of blood chemistry: Secondary | ICD-10-CM

## 2024-01-23 NOTE — Progress Notes (Signed)
 [facility]  PATIENT: Tracy Mcconnell DOB: [birth year]  REFERRING DOCTOR OR PCP: PCP is [facility] SOURCE: Patient, notes from neurology and hospital, imaging and lab reports, MRI images personally reviewed  _________________________________   HISTORICAL  CHIEF COMPLAINT:  Chief Complaint  Patient presents with   Follow-up    Pt in room 10. Alone. Here for MS     HISTORY OF PRESENT ILLNESS:  Tracy Mcconnell is a 28 y.o. woman with left optic neuritis and abnormal brain MRi  Update 01/23/2024 She feels her MS is stable and she denies exacerbations.  She is on DMF and tolerates it well.    Migraines are doing well on Botox .   She usually only has a couple migraines during the first 10 weeks and then a couple the week or two before the next cycle.  Ubrelvy  helps when the migraine occurs.  Bright lights can sometimes trigger migraine.  They are often associated with nausea/vomiting and photophobia  She is walking well and denies falls.   She usually uses the rails on stairs but does not need to use them.  She has left > right leg weakness and just a little numbness.  She no loger has a Lhermitte sign. .      The MRI of the brain 03/14/2023 showed no new lesions.   The 02/09/2022  MRI of the brain and cervical spine showed no acute or new findings.    She had left ON and occasionally she notes left visual  blurring but no longer has color desaturation           Multiple sclerosis/ History: In February 2021, she had the onset of left eye blurry vision and pain, worse with eye movements.    She saw urgent care and then ophthalmology   She was felt to have optic neuritis and presented to the [facility] ED.   MRI of the brain and orbits showed enhancement of the left optic nerve,   There were a few T2 hyperintense foci in the hemispheres, including a periventricular focus in the right frontal lobe and one juxtacortical focus in the right parietal  lobe.  None of the brain foci enhanced.  In retrospect, she has numbness and weakness in her legs that lasted a week, left > right in November 2019.   She had an LP 07/01/2019 and CSF showed no OCB and normal IgG Index   Imaging: MRI of the brain 03/14/2023 was unchanged.  MRI brain and cervical spine 02/09/2022 were unchanged  MRI of the brain 10/15/2020 was unchanged  MRI cervical spine 10/15/2020 was normal  MRI of the brain 05/23/2020 showed scattered T2/FLAIR hyperintense foci including 2 in the periventricular white matter and 1 in the juxtacortical white matter.  None of the foci enhance.  Compared to the MRI of the brain from 12/24/2019 and 06/10/2019, there are no new lesions.  MRI of the cervical spine 05/23/2020 showed a normal spinal cord.  No degenerative changes.  MRI of the orbits 05/23/2020 showed no enhancing lesions.  There was slight hyperintensity of the left optic nerve consistent with the previous optic neuritis.    Her maternal aunt and two second cousins have MS.    REVIEW OF SYSTEMS: Constitutional: No fevers, chills, sweats, or change in appetite Eyes: No visual changes, double vision, eye pain Ear, nose and throat: No hearing loss, ear pain, nasal congestion, sore throat Cardiovascular: No chest pain, palpitations Respiratory:  No shortness of breath at rest  or with exertion.   No wheezes GastrointestinaI: No nausea, vomiting, diarrhea, abdominal pain, fecal incontinence Genitourinary:  No dysuria, urinary retention or frequency.  No nocturia. Musculoskeletal:  No neck pain, back pain Integumentary: No rash, pruritus, skin lesions Neurological: as above Psychiatric: No depression at this time.  No anxiety Endocrine: No palpitations, diaphoresis, change in appetite, change in weigh or increased thirst Hematologic/Lymphatic:  No anemia, purpura, petechiae. Allergic/Immunologic: No itchy/runny eyes, nasal congestion, recent allergic reactions,  rashes  ALLERGIES: Allergies  Allergen Reactions   Sumatriptan Shortness Of Breath and Other (See Comments)    Entire body felt heavy   Other Itching, Swelling and Other (See Comments)    Pesticides (family history)- Exposure face and mouth itch and the tongue/lips swell    HOME MEDICATIONS:  Current Outpatient Medications:    botulinum toxin Type A  (BOTOX ) 200 units injection, Provider to inject 155 units into the muscles of the head and neck every 3 months. Discard remainder, Disp: 1 each, Rfl: 2   buPROPion  (WELLBUTRIN  XL) 300 MG 24 hr tablet, Take 1 tablet (300 mg total) by mouth daily., Disp: 30 tablet, Rfl: 2   busPIRone  (BUSPAR ) 30 MG tablet, Take 1 tablet (30 mg total) by mouth in the morning and at bedtime., Disp: 60 tablet, Rfl: 2   cyclobenzaprine  (FLEXERIL ) 10 MG tablet, Take 1 tablet (10 mg total) by mouth 3 (three) times daily as needed for muscle spasms., Disp: 30 tablet, Rfl: 0   Dimethyl Fumarate  240 MG CPDR, TAKE 1 CAPSULE BY MOUTH IN THE MORNING AND 1 CAPSULE AT BEDTIME., Disp: 180 capsule, Rfl: 0   FLUoxetine  (PROZAC ) 40 MG capsule, Take 2 capsules (80 mg total) by mouth in the morning., Disp: 60 capsule, Rfl: 2   gabapentin  (NEURONTIN ) 600 MG tablet, Take 1 tablet (600 mg total) by mouth 4 (four) times daily., Disp: 120 tablet, Rfl: 5   naproxen  (NAPROSYN ) 500 MG tablet, Take 1 tablet (500 mg total) by mouth 2 (two) times daily with a meal., Disp: 30 tablet, Rfl: 0   ondansetron  (ZOFRAN  ODT) 4 MG disintegrating tablet, Take 1 tablet (4 mg total) by mouth every 8 (eight) hours as needed for nausea or vomiting., Disp: 20 tablet, Rfl: 0   ondansetron  (ZOFRAN ) 4 MG tablet, Take 1 tablet (4 mg total) by mouth every 8 (eight) hours as needed for nausea or vomiting., Disp: 20 tablet, Rfl: 0   pantoprazole  (PROTONIX ) 40 MG tablet, Take 1 tablet (40 mg total) by mouth daily., Disp: 30 tablet, Rfl: 3   sucralfate  (CARAFATE ) 1 GM/10ML suspension, Take 10 mLs (1 g total) by mouth  4 (four) times daily -  with meals and at bedtime., Disp: 420 mL, Rfl: 0   traZODone  (DESYREL ) 50 MG tablet, Take 1 tablet (50 mg total) by mouth at bedtime as needed for sleep (1-2 tabs as needed)., Disp: 60 tablet, Rfl: 2   Ubrogepant  (UBRELVY ) 100 MG TABS, Take 1 tablet (100 mg total) by mouth daily as needed. Take one tablet at onset of headache, may repeat 1 tablet in 2 hours, no more than 2 tablets in 24 hours, Disp: 24 tablet, Rfl: 3  PAST MEDICAL HISTORY: Past Medical History:  Diagnosis Date   Anemia    Anxiety    Depression    GERD (gastroesophageal reflux disease)    Hypovitaminosis D 04/07/2019   Left eye complaint 06/08/2019   Migraines    MS (multiple sclerosis)     PAST SURGICAL HISTORY: Past Surgical History:  Procedure Laterality Date   CHOLECYSTECTOMY N/A 10/19/2019   Procedure: LAPAROSCOPIC CHOLECYSTECTOMY;  Surgeon: Signe Mitzie LABOR, MD;  Location: [facility];  Service: General;  Laterality: N/A;   LAPAROSCOPIC APPENDECTOMY N/A 01/17/2020   Procedure: APPENDECTOMY LAPAROSCOPIC;  Surgeon: Vanderbilt Ned, MD;  Location: [facility];  Service: General;  Laterality: N/A;    FAMILY HISTORY: Family History  Problem Relation Age of Onset   Migraines Mother    Thyroid  disease Mother    Cancer Maternal Grandmother    Hypertension Maternal Grandfather    Rheum arthritis Paternal Grandmother    Diabetes Paternal Grandmother    Stroke Paternal Grandmother    Diabetes Paternal Grandfather     SOCIAL HISTORY:  Social History   Socioeconomic History   Marital status: Single    Spouse name: Not on file   Number of children: 0   Years of education: college student   Highest education level: Not on file  Occupational History   Not on file  Tobacco Use   Smoking status: Never   Smokeless tobacco: Never  Vaping Use   Vaping status: Never Used  Substance and Sexual Activity   Alcohol use: Not Currently   Drug use: No   Sexual activity: Yes    Birth  control/protection: None  Other Topics Concern   Not on file  Social History Narrative   Lives at home alone   Right handed   Does not drink caffeine, makes her nauseated   Social Drivers of Corporate investment banker Strain: Not on file  Food Insecurity: Not on file  Transportation Needs: No Transportation Needs (02/04/2017)   PRAPARE - Administrator, Civil Service (Medical): No    Lack of Transportation (Non-Medical): No  Physical Activity: Not on file  Stress: Not on file  Social Connections: Not on file  Intimate Partner Violence: Not on file     PHYSICAL EXAM  Vitals:   01/23/24 1518  BP: 105/63  Pulse: 71  Weight: 98 lb 8 oz (44.7 kg)  Height: 5' 1 (1.549 m)    Body mass index is 18.61 kg/m.  No results found.  General: The patient is well-developed and well-nourished and in no acute distress.  Mild cervical araspinal tenderness  HEENT:  Head is S[place]/AT.  Sclera are anicteric.     Skin/extremities: Extremities are without rash or  edema.     Neurologic Exam  Mental status: The patient is alert and oriented x 3 at the time of the examination. The patient has apparent normal recent and remote memory, with an apparently normal attention span and concentration ability.   Speech is normal.  Cranial nerves: Extraocular movements are full. Pupils show 1+ left APD.   Visual acuity was symmetric though, vision was reduced OS facial strength was normal and symmetric.SABRA No obvious hearing deficits are noted.  Motor:  Muscle bulk is normal.   Tone is normal. Strength is  5 / 5 in all 4 extremities.   Sensory: She reports reduced/altered sensation in the right arm relative to the left, mildly asymmetric in legs.    Coordination: Cerebellar testing reveals good finger-nose-finger and heel-to-shin bilaterally.  Gait and station: Station is normal.   Her gait is normal.  Tandem gait is normal.  Romberg is negative.  Reflexes: Deep tendon reflexes are  symmetric and normal bilaterally (2 in the arms and 3 in the legs).   P    DIAGNOSTIC DATA (LABS, IMAGING, TESTING) - I reviewed patient  records, labs, notes, testing and imaging myself where available.  Lab Results  Component Value Date   WBC 7.6 01/23/2023   HGB 13.3 01/23/2023   HCT 40.4 01/23/2023   MCV 94 01/23/2023   PLT 349 01/23/2023      Component Value Date/Time   NA 139 08/23/2021 1059   NA 140 12/28/2019 1655   K 3.5 08/23/2021 1059   CL 101 08/23/2021 1059   CO2 29 08/23/2021 1059   GLUCOSE 86 08/23/2021 1059   BUN 8 08/23/2021 1059   BUN 9 12/28/2019 1655   CREATININE 0.62 08/23/2021 1059   CALCIUM 9.9 08/23/2021 1059   PROT 7.0 07/24/2022 1541   ALBUMIN 4.5 07/24/2022 1541   ALBUMIN 4.2 07/01/2019 0919   AST 16 07/24/2022 1541   ALT 9 07/24/2022 1541   ALKPHOS 84 07/24/2022 1541   BILITOT 0.4 07/24/2022 1541   GFRNONAA >60 02/24/2021 2319   GFRAA 138 12/28/2019 1655   Lab Results  Component Value Date   CHOL 192 09/09/2020   HDL 50 09/09/2020   LDLCALC 128 (H) 09/09/2020   TRIG 68 09/09/2020   CHOLHDL 3.8 09/09/2020   Lab Results  Component Value Date   HGBA1C 5.2 09/11/2020   Lab Results  Component Value Date   VITAMINB12 553 06/17/2019   Lab Results  Component Value Date   TSH 0.88 08/23/2021       ASSESSMENT AND PLAN  Relapsing remitting multiple sclerosis - Plan: CBC with Differential/Platelet, MR BRAIN W WO CONTRAST  High risk medication use - Plan: CBC with Differential/Platelet  Chronic migraine without aura without status migrainosus, not intractable  History of optic neuritis  Low vitamin D  level - Plan: VITAMIN D  25 Hydroxy (Vit-D Deficiency, Fractures)   1.   Premises stable.  Continue Tecfidera .    Will recheck CBC/D   Check MRI brain to determine if subclinical breakthrough activity.   If this is occurring then consider a different DMT 2.  Stay active exercise as tolerated. 3.   Continue Botox  for chronic migraine       4.   Return in 6 months or sooner if there are new or worsening neurologic symptoms.   This visit is part of a comprehensive longitudinal care medical relationship regarding the patients primary diagnosis of RRMS and related concerns.   Ameen Mostafa A. Vear, MD, [facility] 01/23/2024, 3:52 PM Certified in Neurology, Clinical Neurophysiology, Sleep Medicine and Neuroimaging  [facility] [address] [place], [place] [address] [phone]

## 2024-01-23 NOTE — Progress Notes (Signed)
 Specialty Pharmacy Initial Fill Coordination Note  Tracy Mcconnell is a 28 y.o. female contacted today regarding initial fill of specialty medication(s) OnabotulinumtoxinA  (BOTOX )   Patient requested Courier to Provider Office   Delivery date: 01/27/24   Verified address: [facility] [address] [place], [place] [address]   Medication will be filled on 10.24.25.   Patient is aware of 0 copayment.

## 2024-01-24 ENCOUNTER — Ambulatory Visit: Payer: Self-pay | Admitting: Neurology

## 2024-01-24 LAB — CBC WITH DIFFERENTIAL/PLATELET
Basophils Absolute: 0 x10E3/uL (ref 0.0–0.2)
Basos: 1 %
EOS (ABSOLUTE): 0.1 x10E3/uL (ref 0.0–0.4)
Eos: 1 %
Hematocrit: 36.8 % (ref 34.0–46.6)
Hemoglobin: 12.2 g/dL (ref 11.1–15.9)
Immature Grans (Abs): 0 x10E3/uL (ref 0.0–0.1)
Immature Granulocytes: 0 %
Lymphocytes Absolute: 1.7 x10E3/uL (ref 0.7–3.1)
Lymphs: 30 %
MCH: 31.4 pg (ref 26.6–33.0)
MCHC: 33.2 g/dL (ref 31.5–35.7)
MCV: 95 fL (ref 79–97)
Monocytes Absolute: 0.4 x10E3/uL (ref 0.1–0.9)
Monocytes: 7 %
Neutrophils Absolute: 3.5 x10E3/uL (ref 1.4–7.0)
Neutrophils: 61 %
Platelets: 327 x10E3/uL (ref 150–450)
RBC: 3.89 x10E6/uL (ref 3.77–5.28)
RDW: 12.3 % (ref 11.7–15.4)
WBC: 5.7 x10E3/uL (ref 3.4–10.8)

## 2024-01-24 LAB — VITAMIN D 25 HYDROXY (VIT D DEFICIENCY, FRACTURES): Vit D, 25-Hydroxy: 27.1 ng/mL — ABNORMAL LOW (ref 30.0–100.0)

## 2024-01-24 MED ORDER — VITAMIN D (ERGOCALCIFEROL) 1.25 MG (50000 UNIT) PO CAPS: 50000.0000 [IU] | ORAL_CAPSULE | ORAL | 3 refills | Status: AC

## 2024-01-27 ENCOUNTER — Ambulatory Visit: Admitting: Family Medicine

## 2024-01-27 DIAGNOSIS — G43709 Chronic migraine without aura, not intractable, without status migrainosus: Secondary | ICD-10-CM

## 2024-01-28 ENCOUNTER — Ambulatory Visit (INDEPENDENT_AMBULATORY_CARE_PROVIDER_SITE_OTHER): Admitting: Family Medicine

## 2024-01-28 ENCOUNTER — Ambulatory Visit: Admitting: Family Medicine

## 2024-01-28 ENCOUNTER — Encounter: Payer: Self-pay | Admitting: Family Medicine

## 2024-01-28 DIAGNOSIS — G43709 Chronic migraine without aura, not intractable, without status migrainosus: Secondary | ICD-10-CM | POA: Diagnosis not present

## 2024-01-28 MED ORDER — ONABOTULINUMTOXINA 200 UNITS IJ SOLR
155.0000 [IU] | Freq: Once | INTRAMUSCULAR | Status: AC
  Administered 2024-01-28: 155 [IU] via INTRAMUSCULAR

## 2024-01-28 NOTE — Progress Notes (Signed)
 01/27/2024: Tracy Mcconnell returns for Botox . Migraines remain well managed. She continues Ubrelvy . She has to take 2 to abort migraine. Nurtec worked better but not covered.   10/24/2023: Tracy Mcconnell returns for Botox . She is doing well. She continues Ubrelvy  for abortive therapy.   07/31/2023 ALL: Tracy Mcconnell returns for Botox . She is doing well. She continues Ubrelvy  for abortive therapy.   05/01/2023 ALL: Tracy Mcconnell returns for Botox . She continues to do well. Migraines are well managed. Ubrelvy  works well for abortive therapy.   01/24/2023 ALL: Tracy Mcconnell returns for Botox . Last procedure 07/2022. She reports doing well. She may have 7 headache days a month. Ubrelvy  works well for abortive therapy. She was seen yesterday by Dr Vear. MS stable.   07/11/2022 ALL: Tracy Mcconnell returns for Botox . She continues to do very well. Ubrelvy  works well for abortive therapy.   04/18/2022 ALL: Tracy Mcconnell returns for Botox . She reports doing well. She may have about 10 headache days a month with 2-3 being severe. She continues follow up with me and Dr Vear for MS.   01/24/2022 ALL: Tracy Mcconnell returns for Botox . She is doing well. Neck pain has improved. She is scheduled for MRI 02/09/2022. Migraines unchanged.   10/25/2021 ALL: Tracy Mcconnell returns for Botox . She is doing well. She has about 10 headache days a month with 2-3 migraines. Ubrelvy  works well.   08/02/2021 ALL: Tracy Mcconnell returns for Botox . She continues to do well. Last seen by Dr Vear 05/2021. MS stable on Tecfidera .   05/09/2021 ALL: She returns for Botox . She feels migraines are well managed. She may have 7-8 milder headaches per month but onlu 1-2 migraines. Ubrelvy  helps with abortive therapy. No new MS symptoms. She continues Tecfidera . She missed her follow up with Dr Vear 04/19/21.   02/14/2021 ALL: She continues to do well on Botox . Ubrelvy  helps with migraine abortion. She feels migraines are better She has about 10 headache days with 3 severe headaches monthly. She  continues tecfidera . She has follow up with [facility] in January. She denies new or worsening symptoms.   11/03/2020: She continues to do well with Botox  therapy. Migraines are well managed. Nurtec seems to work best for abortive therapy but not covered. She is using Ubrelvy  that helps some. She was started on Tecfidera  09/2020 following concerns of 1 new focus on brain MRI. She is tolerating well and has follow up with Dr Vear 04/2021.   08/02/2020 ALL: She continues to note benefit of Botox  in migraine management. She continues Ubrelvy  for abortive therapy. She continues follow up with Dr Vear. Reports bilateral face, upper and lower extremity numbness on two separate occasions. No changes in MRI last performed 05/2020. She has follow up with Dr Vear in 12/2020.   04/21/2020 ALL: She returns today for fourth Botox  procedure. She tolerated last procedure well Johnnie Russell). She does feel Botox  is helping migraines. She does continue to have about 10 migraine days per month. She has failed sumatriptan and rizatriptan . She is taking gabapentin  and indomethacin  for back pain. Nurtec works well but not covered by [employer]. She has not tried Ubrelvy . 1 Nurtec sample and 2 Ubrelvy  50mg  samples given in office today.   She did have optic neuritis in March. MRI concerning for foci. LP performed and CSF normal. She is being followed by Dr Vear every 6 months with repeat imaging planned. She reports vision loss of left eye resolved. She has had some blurred/double vision bilaterally and is followed by Dr Octavia.    Consent Form Botulism Toxin  Injection For Chronic Migraine    Reviewed orally with patient, additionally signature is on file:  Botulism toxin has been approved by the Federal drug administration for treatment of chronic migraine. Botulism toxin does not cure chronic migraine and it may not be effective in some patients.  The administration of botulism toxin is accomplished by injecting a small  amount of toxin into the muscles of the neck and head. Dosage must be titrated for each individual. Any benefits resulting from botulism toxin tend to wear off after 3 months with a repeat injection required if benefit is to be maintained. Injections are usually done every 3-4 months with maximum effect peak achieved by about 2 or 3 weeks. Botulism toxin is expensive and you should be sure of what costs you will incur resulting from the injection.  The side effects of botulism toxin use for chronic migraine may include:   -Transient, and usually mild, facial weakness with facial injections  -Transient, and usually mild, head or neck weakness with head/neck injections  -Reduction or loss of forehead facial animation due to forehead muscle weakness  -Eyelid drooping  -Dry eye  -Pain at the site of injection or bruising at the site of injection  -Double vision  -Potential unknown long term risks   Contraindications: You should not have Botox  if you are pregnant, nursing, allergic to albumin, have an infection, skin condition, or muscle weakness at the site of the injection, or have myasthenia gravis, Lambert-Eaton syndrome, or ALS.  It is also possible that as with any injection, there may be an allergic reaction or no effect from the medication. Reduced effectiveness after repeated injections is sometimes seen and rarely infection at the injection site may occur. All care will be taken to prevent these side effects. If therapy is given over a long time, atrophy and wasting in the muscle injected may occur. Occasionally the patient's become refractory to treatment because they develop antibodies to the toxin. In this event, therapy needs to be modified.  I have read the above information and consent to the administration of botulism toxin.    BOTOX  PROCEDURE NOTE FOR MIGRAINE HEADACHE  Contraindications and precautions discussed with patient(above). Aseptic procedure was observed and patient  tolerated procedure. Procedure performed by Greig Forbes, FNP-C.   The condition has existed for more than 6 months, and pt does not have a diagnosis of ALS, Myasthenia Gravis or Lambert-Eaton Syndrome.  Risks and benefits of injections discussed and pt agrees to proceed with the procedure.  Written consent obtained  These injections are medically necessary. Pt  receives good benefits from these injections. These injections do not cause sedations or hallucinations which the oral therapies may cause.   Description of procedure:  The patient was placed in a sitting position. The standard protocol was used for Botox  as follows, with 5 units of Botox  injected at each site:  -Procerus muscle, midline injection  -Corrugator muscle, bilateral injection  -Frontalis muscle, bilateral injection, with 2 sites each side, medial injection was performed in the upper one third of the frontalis muscle, in the region vertical from the medial inferior edge of the superior orbital rim. The lateral injection was again in the upper one third of the forehead vertically above the lateral limbus of the cornea, 1.5 cm lateral to the medial injection site.  -Temporalis muscle injection, 4 sites, bilaterally. The first injection was 3 cm above the tragus of the ear, second injection site was 1.5 cm to 3 cm up from  the first injection site in line with the tragus of the ear. The third injection site was 1.5-3 cm forward between the first 2 injection sites. The fourth injection site was 1.5 cm posterior to the second injection site. 5th site laterally in the temporalis  muscleat the level of the outer canthus.  -Occipitalis muscle injection, 3 sites, bilaterally. The first injection was done one half way between the occipital protuberance and the tip of the mastoid process behind the ear. The second injection site was done lateral and superior to the first, 1 fingerbreadth from the first injection. The third injection site was 1  fingerbreadth superiorly and medially from the first injection site.  -Cervical paraspinal muscle injection, 2 sites, bilaterally. The first injection site was 1 cm from the midline of the cervical spine, 3 cm inferior to the lower border of the occipital protuberance. The second injection site was 1.5 cm superiorly and laterally to the first injection site.  -Trapezius muscle injection was performed at 3 sites, bilaterally. The first injection site was in the upper trapezius muscle halfway between the inflection point of the neck, and the acromion. The second injection site was one half way between the acromion and the first injection site. The third injection was done between the first injection site and the inflection point of the neck.   Will return for repeat injection in 3 months.   A total of 200 units of Botox  was prepared, 155 units of Botox  was injected as documented above, any Botox  not injected was wasted. The patient tolerated the procedure well, there were no complications of the above procedure.

## 2024-01-28 NOTE — Progress Notes (Signed)
.  Botox - 200 units x 1 vial Lot: [id number] Expiration: 08/2025 NDC: 9976-6078-97  Bacteriostatic 0.9% Sodium Chloride - 30 mL  Lot: [id number] Expiration: OCT-31-2026 NDC: 9590-8033-97  Dx:G43.709 S/P Witnessed ab:Tracy Mcconnell

## 2024-02-04 ENCOUNTER — Telehealth: Payer: Self-pay

## 2024-02-04 NOTE — Telephone Encounter (Signed)
   Pa pproval letter has been placed in chart under media tab.

## 2024-03-16 ENCOUNTER — Other Ambulatory Visit: Payer: Self-pay | Admitting: Neurology

## 2024-03-16 DIAGNOSIS — G35D Multiple sclerosis, unspecified: Secondary | ICD-10-CM

## 2024-03-18 ENCOUNTER — Other Ambulatory Visit: Payer: Self-pay

## 2024-04-06 ENCOUNTER — Other Ambulatory Visit (HOSPITAL_COMMUNITY): Payer: Self-pay

## 2024-04-06 ENCOUNTER — Telehealth: Payer: Self-pay

## 2024-04-06 NOTE — Telephone Encounter (Signed)
 SABRA

## 2024-04-07 ENCOUNTER — Telehealth: Payer: Self-pay | Admitting: Family Medicine

## 2024-04-07 NOTE — Telephone Encounter (Signed)
 Submitted auth request via CMM, shara was approved. She will continue to fill through [facility].   Auth#: [id number] (04/02/24-04/01/25)

## 2024-04-09 ENCOUNTER — Other Ambulatory Visit: Payer: Self-pay

## 2024-04-24 ENCOUNTER — Other Ambulatory Visit: Payer: Self-pay

## 2024-04-27 ENCOUNTER — Other Ambulatory Visit (HOSPITAL_COMMUNITY): Payer: Self-pay

## 2024-04-29 ENCOUNTER — Other Ambulatory Visit: Payer: Self-pay

## 2024-04-30 ENCOUNTER — Other Ambulatory Visit: Payer: Self-pay

## 2024-04-30 NOTE — Telephone Encounter (Signed)
 [facility] let me know that pt told them she was going to call back with her copay for the Botox . As of today, they haven't heard back from her. I called and LVM for the pt informing her if they don't hear from her by this afternoon her appt will be cancelled.
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
## 2024-05-01 ENCOUNTER — Other Ambulatory Visit: Payer: Self-pay

## 2024-05-04 ENCOUNTER — Ambulatory Visit: Admitting: Family Medicine

## 2024-05-04 ENCOUNTER — Telehealth: Payer: Self-pay | Admitting: Family Medicine
